# Patient Record
Sex: Male | Born: 1945
Health system: Southern US, Community
[De-identification: ages and names within clinical notes are randomized; demographics above are authoritative.]

## PROBLEM LIST (undated history)

## (undated) DIAGNOSIS — C61 Malignant neoplasm of prostate: Secondary | ICD-10-CM

## (undated) DIAGNOSIS — I10 Essential (primary) hypertension: Secondary | ICD-10-CM

## (undated) DIAGNOSIS — I499 Cardiac arrhythmia, unspecified: Secondary | ICD-10-CM

## (undated) DIAGNOSIS — D649 Anemia, unspecified: Secondary | ICD-10-CM

## (undated) DIAGNOSIS — G709 Myoneural disorder, unspecified: Secondary | ICD-10-CM

## (undated) DIAGNOSIS — E785 Hyperlipidemia, unspecified: Secondary | ICD-10-CM

## (undated) DIAGNOSIS — I4891 Unspecified atrial fibrillation: Secondary | ICD-10-CM

## (undated) DIAGNOSIS — I4892 Unspecified atrial flutter: Secondary | ICD-10-CM

## (undated) DIAGNOSIS — Z860101 Personal history of adenomatous and serrated colon polyps: Secondary | ICD-10-CM

## (undated) DIAGNOSIS — B029 Zoster without complications: Secondary | ICD-10-CM

## (undated) DIAGNOSIS — E119 Type 2 diabetes mellitus without complications: Secondary | ICD-10-CM

## (undated) DIAGNOSIS — I482 Chronic atrial fibrillation, unspecified: Secondary | ICD-10-CM

## (undated) DIAGNOSIS — Z86010 Personal history of colonic polyps: Secondary | ICD-10-CM

## (undated) DIAGNOSIS — Z9289 Personal history of other medical treatment: Secondary | ICD-10-CM

## (undated) DIAGNOSIS — I6529 Occlusion and stenosis of unspecified carotid artery: Secondary | ICD-10-CM

## (undated) HISTORY — DX: Chronic atrial fibrillation, unspecified: I48.20

## (undated) HISTORY — DX: Anemia, unspecified: D64.9

## (undated) HISTORY — DX: Personal history of other medical treatment: Z92.89

## (undated) HISTORY — DX: Hyperlipidemia, unspecified: E78.5

## (undated) HISTORY — DX: Personal history of colonic polyps: Z86.010

## (undated) HISTORY — DX: Unspecified atrial flutter: I48.92

## (undated) HISTORY — PX: MOHS SURGERY: SUR867

## (undated) HISTORY — PX: COLONOSCOPY W/ POLYPECTOMY: SHX1380

## (undated) HISTORY — DX: Type 2 diabetes mellitus without complications: E11.9

## (undated) HISTORY — PX: SEPTOPLASTY: SUR1290

## (undated) HISTORY — PX: EYE SURGERY: SHX253

## (undated) HISTORY — DX: Malignant neoplasm of prostate: C61

## (undated) HISTORY — DX: Personal history of adenomatous and serrated colon polyps: Z86.0101

## (undated) HISTORY — DX: Essential (primary) hypertension: I10

## (undated) HISTORY — DX: Occlusion and stenosis of unspecified carotid artery: I65.29

---
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
## 2002-02-26 ENCOUNTER — Ambulatory Visit (HOSPITAL_COMMUNITY): Admission: RE | Admit: 2002-02-26 | Discharge: 2002-02-26 | Payer: Self-pay | Admitting: *Deleted

## 2002-02-26 ENCOUNTER — Encounter (INDEPENDENT_AMBULATORY_CARE_PROVIDER_SITE_OTHER): Payer: Self-pay | Admitting: Specialist

## 2002-02-26 ENCOUNTER — Encounter: Payer: Self-pay | Admitting: Gastroenterology

## 2004-07-15 ENCOUNTER — Ambulatory Visit: Payer: Self-pay | Admitting: Internal Medicine

## 2004-09-05 DIAGNOSIS — C61 Malignant neoplasm of prostate: Secondary | ICD-10-CM

## 2004-09-05 HISTORY — PX: PROSTATECTOMY: SHX69

## 2004-09-05 HISTORY — DX: Malignant neoplasm of prostate: C61

## 2004-09-17 ENCOUNTER — Ambulatory Visit: Payer: Self-pay | Admitting: Internal Medicine

## 2005-01-11 ENCOUNTER — Ambulatory Visit: Payer: Self-pay | Admitting: Internal Medicine

## 2005-01-14 ENCOUNTER — Ambulatory Visit: Payer: Self-pay | Admitting: Internal Medicine

## 2005-05-30 ENCOUNTER — Ambulatory Visit: Payer: Self-pay | Admitting: Internal Medicine

## 2005-06-09 ENCOUNTER — Ambulatory Visit: Payer: Self-pay | Admitting: Internal Medicine

## 2005-06-20 ENCOUNTER — Ambulatory Visit: Payer: Self-pay

## 2005-06-22 ENCOUNTER — Inpatient Hospital Stay (HOSPITAL_COMMUNITY): Admission: RE | Admit: 2005-06-22 | Discharge: 2005-06-24 | Payer: Self-pay | Admitting: Urology

## 2005-06-22 ENCOUNTER — Encounter (INDEPENDENT_AMBULATORY_CARE_PROVIDER_SITE_OTHER): Payer: Self-pay | Admitting: *Deleted

## 2005-07-14 ENCOUNTER — Ambulatory Visit: Payer: Self-pay | Admitting: Internal Medicine

## 2005-08-18 ENCOUNTER — Ambulatory Visit: Payer: Self-pay | Admitting: Internal Medicine

## 2006-06-06 ENCOUNTER — Ambulatory Visit: Payer: Self-pay | Admitting: Internal Medicine

## 2006-07-20 ENCOUNTER — Ambulatory Visit: Payer: Self-pay | Admitting: Internal Medicine

## 2006-09-05 DIAGNOSIS — I4892 Unspecified atrial flutter: Secondary | ICD-10-CM

## 2006-09-05 HISTORY — DX: Unspecified atrial flutter: I48.92

## 2007-01-12 ENCOUNTER — Encounter: Payer: Self-pay | Admitting: Internal Medicine

## 2007-01-12 ENCOUNTER — Ambulatory Visit: Payer: Self-pay | Admitting: Internal Medicine

## 2007-01-12 ENCOUNTER — Inpatient Hospital Stay (HOSPITAL_COMMUNITY): Admission: AD | Admit: 2007-01-12 | Discharge: 2007-01-16 | Payer: Self-pay | Admitting: Internal Medicine

## 2007-01-12 ENCOUNTER — Ambulatory Visit: Payer: Self-pay | Admitting: Cardiology

## 2007-01-12 DIAGNOSIS — D126 Benign neoplasm of colon, unspecified: Secondary | ICD-10-CM | POA: Insufficient documentation

## 2007-01-13 ENCOUNTER — Ambulatory Visit: Payer: Self-pay | Admitting: Internal Medicine

## 2007-01-15 ENCOUNTER — Encounter: Payer: Self-pay | Admitting: Cardiology

## 2007-01-15 HISTORY — PX: RADIOFREQUENCY ABLATION: SHX2290

## 2007-01-18 DIAGNOSIS — Z8546 Personal history of malignant neoplasm of prostate: Secondary | ICD-10-CM | POA: Insufficient documentation

## 2007-01-19 ENCOUNTER — Ambulatory Visit: Payer: Self-pay | Admitting: Cardiology

## 2007-01-19 LAB — CONVERTED CEMR LAB
INR: 5.7 (ref 0.9–2.0)
Prothrombin Time: 30.8 s (ref 10.0–14.0)

## 2007-01-23 ENCOUNTER — Encounter: Payer: Self-pay | Admitting: Internal Medicine

## 2007-01-23 ENCOUNTER — Ambulatory Visit: Payer: Self-pay | Admitting: Cardiology

## 2007-01-23 ENCOUNTER — Ambulatory Visit: Payer: Self-pay | Admitting: Internal Medicine

## 2007-01-30 ENCOUNTER — Ambulatory Visit: Payer: Self-pay | Admitting: Cardiology

## 2007-01-30 LAB — CONVERTED CEMR LAB
INR: 7.3 (ref 0.9–2.0)
Prothrombin Time: 35.4 s (ref 10.0–14.0)

## 2007-01-31 ENCOUNTER — Ambulatory Visit: Payer: Self-pay | Admitting: Internal Medicine

## 2007-03-20 ENCOUNTER — Telehealth (INDEPENDENT_AMBULATORY_CARE_PROVIDER_SITE_OTHER): Payer: Self-pay | Admitting: *Deleted

## 2007-04-03 ENCOUNTER — Ambulatory Visit: Payer: Self-pay | Admitting: Internal Medicine

## 2007-05-23 ENCOUNTER — Ambulatory Visit (HOSPITAL_BASED_OUTPATIENT_CLINIC_OR_DEPARTMENT_OTHER): Admission: RE | Admit: 2007-05-23 | Discharge: 2007-05-23 | Payer: Self-pay | Admitting: Urology

## 2007-07-10 ENCOUNTER — Ambulatory Visit: Payer: Self-pay | Admitting: Internal Medicine

## 2007-10-10 ENCOUNTER — Ambulatory Visit: Payer: Self-pay | Admitting: Internal Medicine

## 2007-11-01 ENCOUNTER — Ambulatory Visit: Payer: Self-pay | Admitting: Internal Medicine

## 2007-11-01 DIAGNOSIS — E785 Hyperlipidemia, unspecified: Secondary | ICD-10-CM | POA: Insufficient documentation

## 2007-11-01 DIAGNOSIS — E7800 Pure hypercholesterolemia, unspecified: Secondary | ICD-10-CM | POA: Insufficient documentation

## 2007-11-16 ENCOUNTER — Encounter (INDEPENDENT_AMBULATORY_CARE_PROVIDER_SITE_OTHER): Payer: Self-pay | Admitting: *Deleted

## 2007-11-16 LAB — CONVERTED CEMR LAB
ALT: 26 units/L (ref 0–53)
AST: 23 units/L (ref 0–37)
Albumin: 3.7 g/dL (ref 3.5–5.2)
Alkaline Phosphatase: 58 units/L (ref 39–117)
BUN: 11 mg/dL (ref 6–23)
Bilirubin, Direct: 0.1 mg/dL (ref 0.0–0.3)
Cholesterol: 126 mg/dL (ref 0–200)
Creatinine, Ser: 1.1 mg/dL (ref 0.4–1.5)
HDL: 38.3 mg/dL — ABNORMAL LOW (ref >39.0)
LDL Cholesterol: 68 mg/dL (ref 0–99)
Potassium: 4.9 meq/L (ref 3.5–5.1)
Total Bilirubin: 0.8 mg/dL (ref 0.3–1.2)
Total CHOL/HDL Ratio: 3.3
Total Protein: 6.5 g/dL (ref 6.0–8.3)
Triglycerides: 99 mg/dL (ref 0–149)
VLDL: 20 mg/dL (ref 0–40)

## 2008-03-12 ENCOUNTER — Ambulatory Visit: Payer: Self-pay | Admitting: Internal Medicine

## 2008-04-15 ENCOUNTER — Ambulatory Visit (HOSPITAL_COMMUNITY): Admission: RE | Admit: 2008-04-15 | Discharge: 2008-04-16 | Payer: Self-pay | Admitting: Urology

## 2008-05-01 ENCOUNTER — Encounter: Payer: Self-pay | Admitting: Internal Medicine

## 2008-06-12 ENCOUNTER — Ambulatory Visit: Payer: Self-pay | Admitting: Internal Medicine

## 2008-07-06 HISTORY — PX: HERNIA REPAIR: SHX51

## 2008-07-25 ENCOUNTER — Ambulatory Visit (HOSPITAL_COMMUNITY): Admission: RE | Admit: 2008-07-25 | Discharge: 2008-07-25 | Payer: Self-pay | Admitting: Surgery

## 2008-07-25 ENCOUNTER — Encounter: Payer: Self-pay | Admitting: Internal Medicine

## 2008-09-05 HISTORY — PX: BLADDER SUSPENSION: SHX72

## 2008-09-17 DIAGNOSIS — I10 Essential (primary) hypertension: Secondary | ICD-10-CM | POA: Insufficient documentation

## 2008-09-17 DIAGNOSIS — I4821 Permanent atrial fibrillation: Secondary | ICD-10-CM | POA: Insufficient documentation

## 2009-03-11 ENCOUNTER — Ambulatory Visit: Payer: Self-pay | Admitting: Internal Medicine

## 2009-03-11 DIAGNOSIS — R7303 Prediabetes: Secondary | ICD-10-CM | POA: Insufficient documentation

## 2009-03-11 DIAGNOSIS — Z85828 Personal history of other malignant neoplasm of skin: Secondary | ICD-10-CM | POA: Insufficient documentation

## 2009-03-11 DIAGNOSIS — E119 Type 2 diabetes mellitus without complications: Secondary | ICD-10-CM | POA: Insufficient documentation

## 2009-03-11 HISTORY — DX: Personal history of other malignant neoplasm of skin: Z85.828

## 2009-03-12 ENCOUNTER — Encounter: Payer: Self-pay | Admitting: Internal Medicine

## 2009-03-25 ENCOUNTER — Telehealth (INDEPENDENT_AMBULATORY_CARE_PROVIDER_SITE_OTHER): Payer: Self-pay | Admitting: *Deleted

## 2009-03-25 ENCOUNTER — Encounter (INDEPENDENT_AMBULATORY_CARE_PROVIDER_SITE_OTHER): Payer: Self-pay | Admitting: *Deleted

## 2009-04-24 ENCOUNTER — Ambulatory Visit: Payer: Self-pay | Admitting: Gastroenterology

## 2009-05-15 ENCOUNTER — Encounter: Payer: Self-pay | Admitting: Gastroenterology

## 2009-05-15 ENCOUNTER — Ambulatory Visit: Payer: Self-pay | Admitting: Gastroenterology

## 2009-05-19 ENCOUNTER — Encounter: Payer: Self-pay | Admitting: Gastroenterology

## 2009-06-05 ENCOUNTER — Ambulatory Visit: Payer: Self-pay | Admitting: Internal Medicine

## 2009-07-20 ENCOUNTER — Ambulatory Visit: Payer: Self-pay | Admitting: Internal Medicine

## 2009-07-20 DIAGNOSIS — J069 Acute upper respiratory infection, unspecified: Secondary | ICD-10-CM | POA: Insufficient documentation

## 2009-07-20 DIAGNOSIS — J019 Acute sinusitis, unspecified: Secondary | ICD-10-CM | POA: Insufficient documentation

## 2009-07-23 ENCOUNTER — Telehealth: Payer: Self-pay | Admitting: Internal Medicine

## 2009-07-24 ENCOUNTER — Ambulatory Visit: Payer: Self-pay | Admitting: Internal Medicine

## 2009-07-27 LAB — CONVERTED CEMR LAB: Hgb A1c MFr Bld: 6.2 % (ref 4.6–6.5)

## 2009-08-04 ENCOUNTER — Ambulatory Visit: Payer: Self-pay | Admitting: Internal Medicine

## 2009-09-07 ENCOUNTER — Ambulatory Visit: Payer: Self-pay | Admitting: Internal Medicine

## 2009-10-15 ENCOUNTER — Telehealth (INDEPENDENT_AMBULATORY_CARE_PROVIDER_SITE_OTHER): Payer: Self-pay | Admitting: *Deleted

## 2009-10-21 ENCOUNTER — Telehealth (INDEPENDENT_AMBULATORY_CARE_PROVIDER_SITE_OTHER): Payer: Self-pay | Admitting: *Deleted

## 2009-12-02 ENCOUNTER — Telehealth (INDEPENDENT_AMBULATORY_CARE_PROVIDER_SITE_OTHER): Payer: Self-pay | Admitting: *Deleted

## 2010-02-12 ENCOUNTER — Ambulatory Visit: Payer: Self-pay | Admitting: Internal Medicine

## 2010-02-25 LAB — CONVERTED CEMR LAB
ALT: 19 units/L (ref 0–53)
AST: 18 units/L (ref 0–37)
Albumin: 4 g/dL (ref 3.5–5.2)
Alkaline Phosphatase: 50 units/L (ref 39–117)
Bilirubin, Direct: 0.1 mg/dL (ref 0.0–0.3)
Cholesterol: 142 mg/dL (ref 0–200)
HDL: 39.7 mg/dL (ref >39.00)
LDL Cholesterol: 81 mg/dL (ref 0–99)
Total Bilirubin: 0.4 mg/dL (ref 0.3–1.2)
Total CHOL/HDL Ratio: 4
Total Protein: 6.5 g/dL (ref 6.0–8.3)
Triglycerides: 107 mg/dL (ref 0.0–149.0)
VLDL: 21.4 mg/dL (ref 0.0–40.0)

## 2010-05-19 ENCOUNTER — Ambulatory Visit: Payer: Self-pay | Admitting: Internal Medicine

## 2010-10-03 LAB — CONVERTED CEMR LAB
ALT: 24 units/L (ref 0–53)
AST: 26 units/L (ref 0–37)
Albumin: 4 g/dL (ref 3.5–5.2)
Alkaline Phosphatase: 61 units/L (ref 39–117)
BUN: 13 mg/dL (ref 6–23)
Basophils Absolute: 0 10*3/uL (ref 0.0–0.1)
Basophils Relative: 0 % (ref 0.0–3.0)
Bilirubin, Direct: 0.1 mg/dL (ref 0.0–0.3)
CO2: 30 meq/L (ref 19–32)
Calcium: 9.6 mg/dL (ref 8.4–10.5)
Chloride: 103 meq/L (ref 96–112)
Cholesterol, target level: 200 mg/dL
Cholesterol: 146 mg/dL (ref 0–200)
Creatinine, Ser: 1.1 mg/dL (ref 0.4–1.5)
Eosinophils Absolute: 0.2 10*3/uL (ref 0.0–0.7)
Eosinophils Relative: 3.5 % (ref 0.0–5.0)
GFR calc non Af Amer: 71.88 mL/min (ref >60)
Glucose, Bld: 122 mg/dL — ABNORMAL HIGH (ref 70–99)
HCT: 40.5 % (ref 39.0–52.0)
HDL goal, serum: 40 mg/dL
HDL: 43.3 mg/dL (ref >39.00)
Hemoglobin: 14 g/dL (ref 13.0–17.0)
Hgb A1c MFr Bld: 6.1 % (ref 4.6–6.5)
LDL Cholesterol: 76 mg/dL (ref 0–99)
LDL Goal: 100 mg/dL
Lymphocytes Relative: 17.2 % (ref 12.0–46.0)
Lymphs Abs: 0.8 10*3/uL (ref 0.7–4.0)
MCHC: 34.6 g/dL (ref 30.0–36.0)
MCV: 95.9 fL (ref 78.0–100.0)
Monocytes Absolute: 0.5 10*3/uL (ref 0.1–1.0)
Monocytes Relative: 10.9 % (ref 3.0–12.0)
Neutro Abs: 3.1 10*3/uL (ref 1.4–7.7)
Neutrophils Relative %: 68.4 % (ref 43.0–77.0)
Platelets: 172 10*3/uL (ref 150.0–400.0)
Potassium: 5.2 meq/L — ABNORMAL HIGH (ref 3.5–5.1)
RBC: 4.22 M/uL (ref 4.22–5.81)
RDW: 11.9 % (ref 11.5–14.6)
Sodium: 139 meq/L (ref 135–145)
TSH: 0.79 microintl units/mL (ref 0.35–5.50)
Total Bilirubin: 1.1 mg/dL (ref 0.3–1.2)
Total CHOL/HDL Ratio: 3
Total Protein: 6.9 g/dL (ref 6.0–8.3)
Triglycerides: 133 mg/dL (ref 0.0–149.0)
VLDL: 26.6 mg/dL (ref 0.0–40.0)
WBC: 4.6 10*3/uL (ref 4.5–10.5)

## 2010-10-05 NOTE — Progress Notes (Signed)
Summary: [facility]  Phone Note Call from Patient Call back at [facility] Phone [phone]   Caller: Patient Summary of Call: Message left on VM, Patient will start new chlosterol med Today and would like to know when he is to have labs.   I reviewed chart and per Dr.Hopper patient should have labs 10 weeks after starting new med which would be the second week in June 2011. I called patient and left message on VM for him to call to schedule  fasting lab appointment./[name]  December 02, 2009 2:53 PM

## 2010-10-05 NOTE — Progress Notes (Signed)
Summary: Med- B/P concerns  Phone Note Call from Patient Call back at Home Phone [phone]   Caller: Patient Summary of Call: Message left on VM: Patient would like to know what Vytorin was changed to cause the pharmacy contacted him to pick up a rx and he has never heard of the med. Patient also with B/P concerns and would like a call to discuss. Patient left two numbers to reach him at the home and cell. Both numbers were said so fast that I could not make them out after listening several times.  I called patient and left message on Home Number that is provided in the chart for patient to call for an appointment to F/U on B/P and informed Vytorin was changed to Simvastatin.  Marland KitchenShonna Chock  October 21, 2009 10:27 AM    .Caralyn Guile

## 2010-10-05 NOTE — Progress Notes (Signed)
Summary:  med changes tier  Phone Note Call from Patient Call back at Home Phone [phone]   Caller: Patient fax Summary of Call: pt faxed over a letter stating that beginning September 05, 2009 vytorin will be placed on Tier 3. Current member, however, will still be able to get Vytorin at its current tier until March 31,2011.  [employer] company no prior auth required, pt co-pay will increase at 30% with a minimal co-pay of $45.  If higher co-pay to expensive pt can consider pravastatin, simvastatin, or lovastatin. Any of the statin or a combo of zetia and a statin  are consider a  tier 1. left message to call   office to inform pt of med status......................Marland KitchenFelecia Deloach CMA  October 15, 2009 11:57 AM  pt return call yesterday left message to call office........................Marland KitchenFelecia Deloach CMA  October 16, 2009 9:09 AM   Follow-up for Phone Call        dr hopper pls advise of med change due to increase in co-pay pravastatin, simvastatin, or lovastatin, or a combo of zetia and a statin.................Marland KitchenFelecia Deloach CMA  October 16, 2009 9:47 AM   [facility]  Additional Follow-up for Phone Call Additional follow up Details #1::        Simvastatin 40 mg at bedtime #90 in place of Vytorin 10/20. Lipids, hepatic panel after 10 weeks(272.4,995.20) Additional Follow-up by: Marga Melnick MD,  October 16, 2009 1:42 PM    Additional Follow-up for Phone Call Additional follow up Details #2::    pt aware rx sent to pharmacy,appt schedule................Marland KitchenFelecia Deloach CMA  October 16, 2009 2:09 PM   New/Updated Medications: SIMVASTATIN 40 MG TABS (SIMVASTATIN) Take 1 tab at bedtime Prescriptions: SIMVASTATIN 40 MG TABS (SIMVASTATIN) Take 1 tab at bedtime  #90 x 0   Entered by:   Jeremy Johann CMA   Authorized by:   Marga Melnick MD   Signed by:   Jeremy Johann CMA on 10/16/2009   Method used:   [facility]ed to ...       [facility]  [address] [phone][facility]ail[facility]     [address]       [place], [place]  [address]       Ph: [phone] or [phone]       Fax: [phone]   RxID:   [id number]

## 2010-10-05 NOTE — Assessment & Plan Note (Signed)
Summary: flu shot/cbs   Nurse Visit   Allergies: 1)  ! Levaquin  Orders Added: 1)  Admin 1st Vaccine [90471] 2)  Flu Vaccine 20yrs + [16109] Flu Vaccine Consent Questions     Do you have a history of severe allergic reactions to this vaccine? no    Any prior history of allergic reactions to egg and/or gelatin? no    Do you have a sensitivity to the preservative Thimersol? no    Do you have a past history of Guillan-Barre Syndrome? no    Do you currently have an acute febrile illness? no    Have you ever had a severe reaction to latex? no    Vaccine information given and explained to patient? yes    Are you currently pregnant? no    Lot Number:[id number]   Exp Date:03/05/2011   Site Given  Left Deltoid IMu Vaccine 58yrs + [60454] .lbflu

## 2010-10-27 ENCOUNTER — Encounter: Payer: Self-pay | Admitting: Internal Medicine

## 2010-11-30 ENCOUNTER — Other Ambulatory Visit: Payer: Self-pay | Admitting: Internal Medicine

## 2010-12-02 ENCOUNTER — Telehealth: Payer: Self-pay | Admitting: Internal Medicine

## 2010-12-02 NOTE — Telephone Encounter (Signed)
Added codes and orders to 3/30 lab

## 2010-12-02 NOTE — Telephone Encounter (Signed)
Patient has CPX on Monday 4/2, but wanted to have labs earlier--he has appointment for lab only for tomorrow Fri 3/30 at 9:15am----please provide orders and diag codes.    thanks

## 2010-12-02 NOTE — Telephone Encounter (Signed)
401.1/272.4/995.20/v70.0 Lipid/Hep/CBCD/TSH/BMP/PSA/Udip/Stool Cards

## 2010-12-03 ENCOUNTER — Other Ambulatory Visit (INDEPENDENT_AMBULATORY_CARE_PROVIDER_SITE_OTHER): Payer: BC Managed Care – PPO

## 2010-12-03 DIAGNOSIS — I10 Essential (primary) hypertension: Secondary | ICD-10-CM

## 2010-12-03 DIAGNOSIS — E785 Hyperlipidemia, unspecified: Secondary | ICD-10-CM

## 2010-12-03 DIAGNOSIS — T887XXA Unspecified adverse effect of drug or medicament, initial encounter: Secondary | ICD-10-CM

## 2010-12-03 DIAGNOSIS — Z0000 Encounter for general adult medical examination without abnormal findings: Secondary | ICD-10-CM

## 2010-12-03 LAB — CBC WITH DIFFERENTIAL/PLATELET
Basophils Absolute: 0 10*3/uL (ref 0.0–0.1)
Basophils Relative: 0.4 % (ref 0.0–3.0)
Eosinophils Relative: 3.6 % (ref 0.0–5.0)
HCT: 39.6 % (ref 39.0–52.0)
Hemoglobin: 13.7 g/dL (ref 13.0–17.0)
Lymphocytes Relative: 21.7 % (ref 12.0–46.0)
MCHC: 34.6 g/dL (ref 30.0–36.0)
MCV: 97.3 fl (ref 78.0–100.0)
Monocytes Absolute: 0.5 10*3/uL (ref 0.1–1.0)
Monocytes Relative: 10.2 % (ref 3.0–12.0)
Neutro Abs: 2.9 10*3/uL (ref 1.4–7.7)
Neutrophils Relative %: 64.1 % (ref 43.0–77.0)
Platelets: 165 10*3/uL (ref 150.0–400.0)
RBC: 4.07 Mil/uL — ABNORMAL LOW (ref 4.22–5.81)
WBC: 4.5 10*3/uL (ref 4.5–10.5)

## 2010-12-03 LAB — BASIC METABOLIC PANEL
BUN: 16 mg/dL (ref 6–23)
CO2: 29 mEq/L (ref 19–32)
Calcium: 9.1 mg/dL (ref 8.4–10.5)
Chloride: 102 mEq/L (ref 96–112)
Creatinine, Ser: 0.9 mg/dL (ref 0.4–1.5)
Glucose, Bld: 104 mg/dL — ABNORMAL HIGH (ref 70–99)
Potassium: 4.5 mEq/L (ref 3.5–5.1)
Sodium: 136 mEq/L (ref 135–145)

## 2010-12-03 LAB — HEPATIC FUNCTION PANEL
ALT: 24 U/L (ref 0–53)
Albumin: 3.9 g/dL (ref 3.5–5.2)
Bilirubin, Direct: 0.2 mg/dL (ref 0.0–0.3)
Total Bilirubin: 0.6 mg/dL (ref 0.3–1.2)
Total Protein: 6.4 g/dL (ref 6.0–8.3)

## 2010-12-03 LAB — LIPID PANEL
HDL: 39.3 mg/dL (ref >39.00)
LDL Cholesterol: 72 mg/dL (ref 0–99)
Total CHOL/HDL Ratio: 3
Triglycerides: 122 mg/dL (ref 0.0–149.0)
VLDL: 24.4 mg/dL (ref 0.0–40.0)

## 2010-12-03 LAB — TSH: TSH: 0.91 u[IU]/mL (ref 0.35–5.50)

## 2010-12-06 ENCOUNTER — Ambulatory Visit (INDEPENDENT_AMBULATORY_CARE_PROVIDER_SITE_OTHER): Payer: BC Managed Care – PPO | Admitting: Internal Medicine

## 2010-12-06 ENCOUNTER — Encounter: Payer: Self-pay | Admitting: Internal Medicine

## 2010-12-06 VITALS — BP 122/78 | HR 60 | Temp 98.0°F | Resp 14 | Ht 75.0 in | Wt 209.2 lb

## 2010-12-06 DIAGNOSIS — E785 Hyperlipidemia, unspecified: Secondary | ICD-10-CM

## 2010-12-06 DIAGNOSIS — Z86010 Personal history of colonic polyps: Secondary | ICD-10-CM

## 2010-12-06 DIAGNOSIS — Z85828 Personal history of other malignant neoplasm of skin: Secondary | ICD-10-CM

## 2010-12-06 DIAGNOSIS — I10 Essential (primary) hypertension: Secondary | ICD-10-CM

## 2010-12-06 DIAGNOSIS — Z0000 Encounter for general adult medical examination without abnormal findings: Secondary | ICD-10-CM

## 2010-12-06 DIAGNOSIS — Z23 Encounter for immunization: Secondary | ICD-10-CM

## 2010-12-06 DIAGNOSIS — Z136 Encounter for screening for cardiovascular disorders: Secondary | ICD-10-CM

## 2010-12-06 MED ORDER — LISINOPRIL 40 MG PO TABS: 40.0000 mg | ORAL_TABLET | Freq: Every day | ORAL | Status: DC

## 2010-12-06 MED ORDER — TETANUS-DIPHTH-ACELL PERTUSSIS 5-2.5-18.5 LF-MCG/0.5 IM SUSP
0.5000 mL | Freq: Once | INTRAMUSCULAR | Status: AC
  Administered 2010-12-06: 0.5 mL via INTRAMUSCULAR

## 2010-12-06 MED ORDER — SIMVASTATIN 40 MG PO TABS: 40.0000 mg | ORAL_TABLET | Freq: Every day | ORAL | Status: DC

## 2010-12-06 NOTE — Progress Notes (Signed)
  Subjective:    Patient ID: Mike Day, male    DOB: [birth year], 65 y.o.   MRN: [id number]  HPI He is here for a physical exam; he has no active health issues. He is physically active walking 3-7 miles per day 5 days a week. He plans to run a marathon next year.  Review of Systems Patient reports no  vision/ hearing changes,anorexia, weight change, fever ,adenopathy, persistant / recurrent hoarseness, swallowing issues, chest pain,palpitations, edema,persistant / recurrent cough, hemoptysis, dyspnea(rest, exertional, paroxysmal nocturnal), gastrointestinal  bleeding (melena, rectal bleeding), abdominal pain, excessive heart burn, GU symptoms( dysuria, hematuria, pyuria, voiding/incontinence  Issues) syncope, focal weakness, memory loss,numbness & tingling,new  skin/hair/nail changes,depression,significant  anxiety, abnormal bruising/bleeding, musculoskeletal symptoms/signs.      Objective:   Physical Exam Eye - Pupils Equal Round Reactive to light, Extraocular movements intact, Fundi without hemorrhage or visible lesions, Conjunctiva without redness or discharge Ears:  External ear exam shows no significant lesions or deformities.  Otoscopic examination reveals clear canals, tympanic membranes are intact bilaterally without bulging, retraction, inflammation or discharge. Hearing is grossly normal bilaterall Neck:  No deformities, thyromegaly, masses, or tenderness noted.   Supple with full range of motion without pain. Heart:  Normal rate and regular rhythm. S1 and S2 normal without gallop, click, rub or other extra sounds. Grade 1/2 systolic murmur.Abdomen: bowel sounds normal, soft and non-tender without masses, organomegaly or hernias noted.  No guarding or rebound Extremities:  No cyanosis, edema, or deformity noted with good range of motion of all major joints.    Lungs:Chest clear to auscultation; no wheezes, rhonchi,rales ,or rubs present.No increased work of breathing. Neurologic exam : Strength  equal & normal in upper & lower extremities Able to walk on heels and toes.   Balance normal  Romberg normal, finger to nose.Psych:  Cognition and judgment appear intact. Alert, communicative  and cooperative with normal attention span and concentration.   GU exam by Dr Earlene Plater 07/2010.    Assessment & Plan:  #1 comprehensive physical exam; no active issues  #2 hypertension, controlled  #3 dyslipidemia, lipids are at goal.  #4 mild fasting hyperglycemia  Plan: #1 no change will be made in medications.  #2 an A1c will be added to assess the mild hyperglycemia.

## 2010-12-06 NOTE — Patient Instructions (Signed)
Please monitor your blood pressure on a regular basis; your goals and average less than 135/85.  Preventive Health Care: Exercise at least 30-45 minutes a day,  3-4 days a week.  Eat a low-fat diet with lots of fruits and vegetables, up to 7-9 servings per day. Avoid obesity; your goal is waist measurement < 40 inches.Consume less than 40 grams of sugar per day from foods & drinks with High Fructose Corn Sugar as #2,3 or # 4 on label. Seatbelts can save your life. Wear them always. Smoke detectors on every level of your home, check batteries every year. Eye Doctor - have an eye exam @ least annually.                                                                                  Alcohol If you drink, do it moderately,less than 9 drinks per week, preferably less than 6 @ most. Health Care Power of Attorney & Living Will. Complete if not in place ; these place you in charge of your health care decisions. Depression is common in our stressful world.If you're feeling down or losing interest in things you normally enjoy, please call .

## 2010-12-07 ENCOUNTER — Encounter: Payer: Self-pay | Admitting: Internal Medicine

## 2010-12-07 DIAGNOSIS — I4892 Unspecified atrial flutter: Secondary | ICD-10-CM | POA: Insufficient documentation

## 2010-12-07 LAB — HEMOGLOBIN A1C: Hgb A1c MFr Bld: 6.2 % (ref 4.6–6.5)

## 2010-12-17 ENCOUNTER — Other Ambulatory Visit: Payer: Self-pay | Admitting: Internal Medicine

## 2010-12-23 ENCOUNTER — Other Ambulatory Visit: Payer: Self-pay | Admitting: Internal Medicine

## 2011-01-05 ENCOUNTER — Other Ambulatory Visit: Payer: BC Managed Care – PPO

## 2011-01-05 DIAGNOSIS — Z1211 Encounter for screening for malignant neoplasm of colon: Secondary | ICD-10-CM

## 2011-01-05 LAB — POC HEMOCCULT BLD/STL (OFFICE/1-CARD/DIAGNOSTIC): Fecal Occult Blood, POC: NEGATIVE

## 2011-01-18 NOTE — Assessment & Plan Note (Signed)
[facility]                         ELECTROPHYSIOLOGY OFFICE NOTE   EMMETT, BRACKNELL                       MRN:          [id number]  DATE:01/31/2007                            DOB:          April 30, 1946    [name] returns today for follow up.  He is a very pleasant 65-year-  old male with a history of atrial flutter, dyslipidemia and hypertension  who is status post electrophysiologic study and catheter ablation 2-1/2  weeks ago.  He has done well since his ablation, though he does note  that his Coumadin level has been quite high.  He has had rare  palpitations.   PHYSICAL EXAMINATION:  GENERAL:  He is a pleasant well-appearing middle-  aged man in no distress.  VITAL SIGNS:  Blood pressure today was 106/70, pulse 79 and regular,  respirations 18.  The weight was 218 pounds.  NECK:  No jugular venous distention.  LUNGS:  Clear to auscultation bilaterally.  No wheezing, rales, or  rhonchi.  CARDIOVASCULAR:  Regular rate and rhythm.  Normal S1 and S2.  EXTREMITIES:  No edema.   MEDICATIONS:  1. Lisinopril.  2. Vytorin.  3. Coumadin.  4. Aspirin.   IMPRESSION:  1. Atrial flutter status post ablation.  2. Dyslipidemia.  3. Hypertension.  4. Chronic Coumadin therapy.   DISCUSSION:  As Mike Day is out now over 2-1/2 weeks since his  ablation, and because his INRs have been very high and erratic in their  levels going from 7 to 2 to 8, I have recommended that he discontinue  his Coumadin today and cancel his Coumadin clinic follow ups.  He will  continue taking low-dose aspirin along with Vytorin and lisinopril.  Will plan to see him back on a p.r.n. basis.     Mike Day. Mike Ridgel, MD  Electronically Signed    GWT/MedQ  DD: 01/31/2007  DT: 01/31/2007  Job #: [id number]   cc:   Mike Day. Mike Ren, MD,FACP,FCCP

## 2011-01-18 NOTE — Consult Note (Signed)
NAMERANON, COVEN NO.:  [id number]   MEDICAL RECORD NO.:  [id number]          PATIENT TYPE:  OBV   LOCATION:  2022                         FACILITY:  [facility]   PHYSICIAN:  [name]. Ladona Ridgel, MD    DATE OF BIRTH:  06-22-1946   DATE OF CONSULTATION:  01/15/2007  DATE OF DISCHARGE:                                 CONSULTATION   The consultation is requested by Dr. Dietrich Pates and Dr. Jerral Bonito.   INDICATION FOR CONSULTATION:  Evaluation of atrial flutter.   The patient is a 65 year old male with a history of hypertension and  tachy palpitations, who was recently who was discovered to be in atrial  flutter approximately 1 week ago.  The patient's wife, who is a Engineer, civil (consulting),  states that she was sitting next to him and felt like his chest was  vibrating and checked his pulse and found that his heart rate was 130-  140 beats per minute.  His palpitations and rapid heart rate persisted,  though he was not particularly symptomatic with these, until he  presented to Dr. Frederik Pear office, where he was found to be in atrial  flutter with a rapid ventricular response and was subsequently admitted  for evaluation and treatment.  He was placed on AV nodal blocking agents  and heparin.  The patient now is now referred for evaluation.  He denies  chest pain.  He denies shortness of breath.  He denies peripheral edema.  He does note that, when he is in flutter, when he exerts himself, his  strength is not quite as good as it had been, nor is his activity level.  He has had no syncope.  He denies anginal symptoms.   PAST MEDICAL HISTORY:  Is notable for hypertension.  He has a history of  dyslipidemia and a history of prostate cancer status post surgery.   SOCIAL HISTORY:  The patient.  The patient has a history of cigar  smoking but denies alcohol use.  He drinks one to three glasses of wine  per week.   FAMILY HISTORY:  Notable for coronary disease.   ALLERGIES:  HE IS A HISTORY  OF ALLERGIES TO LEVAQUIN.   REVIEW OF SYSTEMS:  Is negative except for palpitations and the  sensation of feeling hot all the time, here in the last few weeks.  The  rest review of systems were reviewed and found to be negative.   PHYSICAL EXAM:  GENERAL:  He is a pleasant, well-appearing middle-aged  man in no distress.  VITAL SIGNS:  The blood pressure is 135/90.  The pulse was 110 and  irregular.  Respirations were 18.  HEENT:  Normocephalic and atraumatic.  Pupils equal and round.  Oropharynx is moist.  Sclerae anicteric.  NECK:  Revealed no jugular distension.  There is no thyromegaly.  LUNGS:  Clear bilaterally auscultation.  No wheezes, rales or rhonchi.  CARDIAC:  Had an irregular tachycardia with normal S1 and S2.  ABDOMEN:  Soft, nontender, nondistended.  There is no organomegaly.  Bowel sounds are present.  There was no rebound  or guarding.  EXTREMITIES:  Demonstrated no cyanosis, clubbing or edema.  Pulses are  2+ and symmetric.  NEUROLOGIC:  Alert and oriented x3.  Cranial nerves intact.  Strength is  5/5 and symmetric.  The EKG demonstrates atrial flutter with a rapid  ventricular response.   IMPRESSION:  1. Symptomatic typical atrial flutter.  2. Hypertension.  3. Dyslipidemia.   DISCUSSION:  I discussed the treatment options with the patient and his  wife.  The patient is not inclined for lifelong Coumadin therapy, and  for this reason would like to proceed with catheter ablation.  The  risks, benefits and goals of the procedure have been discussed with the  patient.  He has considered these options and will call us, if he would  like to proceed with catheter ablation.      [name]. Ladona Ridgel, MD  Electronically Signed     GWT/MEDQ  D:  01/15/2007  T:  01/15/2007  Job:  [id number]   cc:   Titus Dubin. Alwyn Ren, MD,FACP,FCCP

## 2011-01-18 NOTE — Op Note (Signed)
NAME:  Mike Day, Mike Day                ACCOUNT NO.:  [id number]   MEDICAL RECORD NO.:  [id number]          PATIENT TYPE:  AMB   LOCATION:  DAY                          FACILITY:  [facility]   PHYSICIAN:  Thornton Park. Daphine Deutscher, MD  DATE OF BIRTH:  08/19/1946   DATE OF PROCEDURE:  07/25/2008  DATE OF DISCHARGE:                               OPERATIVE REPORT   PREOPERATIVE DIAGNOSIS:  Umbilical hernia.   POSTOPERATIVE DIAGNOSIS:  Umbilical hernia.   PROCEDURE:  Repair of umbilical hernia with Proceed ventral patch.   SURGEON:  Thornton Park. Daphine Deutscher, MD.   ANESTHESIA:  General endotracheal.   DESCRIPTION OF PROCEDURE:  Mike Day is a 65 year old white male who  previously had a robotic prostatectomy and developed an umbilical hernia  at some point after that.  On exam he has an easily palpable defect and  a little bulge at his umbilicus.   After general endotracheal anesthesia was administered the abdomen was  prepped with Techni-Care and draped sterilely.  Preoperatively it had  been clipped.  I made a curvilinear smiling type incision  infraumbilically and carried this down sharply and then used scissors to  dissect the umbilical skin off the hernia sac.  I mobilized the hernia  sac and then cut well around it and then freed up at the perimeter of  the fascia.  I then put my finger in and was able to undermine the  fascia at least 1 cm or 2 backwards without entering the abdomen.   A new product has been substituted for Ventralex and that product is  Proceed ventral patch which I was given and I used the small variety  which was then first preloaded with four sutures of zero Prolene suture  at 90 degrees, placing the stitch through the permanent mesh and then  holding the sutures before then inserting the mesh into the  preperitoneal space beneath the fascia and then deploying the stitch  through the fascia.  These four were placed.  Again, at right angles to  each other and then when tied,  I held the mesh completely obliterating  the hernia defect.  The wound was then irrigated with saline.  The skin  was tacked down to the fascia and the wound was closed in  layers of 4-0 Vicryl and then with 5-0 Monocryl, Benzoin Steri-Strips.  The patient tolerated the procedure well.  Abdominal binder was applied  and he was given Vicodin for pain and he will be followed up in the  office in 3-4 weeks.      Thornton Park Daphine Deutscher, MD  Electronically Signed     MBM/MEDQ  D:  07/25/2008  T:  07/25/2008  Job:  [id number]   cc:   Titus Dubin. Alwyn Ren, MD,FACP,FCCP  [phone] [place]  [place] [address]   Lucrezia Starch. Earlene Plater, M.D.  Fax: [phone]

## 2011-01-18 NOTE — Op Note (Signed)
Mike Day, Mike Day                ACCOUNT NO.:  [id number]   MEDICAL RECORD NO.:  [id number]          PATIENT TYPE:  AMB   LOCATION:  [facility]                         FACILITY:  [facility]   PHYSICIAN:  Ronald L. Earlene Plater, M.D.  DATE OF BIRTH:  May 09, 1946   DATE OF PROCEDURE:  05/23/2007  DATE OF DISCHARGE:                               OPERATIVE REPORT   DIAGNOSIS:  History of robotic prostatectomy in 2007 with urinary  incontinence and a bladder neck stone.   OPERATIVE PROCEDURE:  Cystourethroscopy, removal of bladder neck stone  and underlying staple.   SURGEON:  Lucrezia Starch. Earlene Plater, M.D.   ANESTHESIA:  LMA.   ESTIMATED BLOOD LOSS:  Negligible.   TUBES:  None.   COMPLICATIONS:  None.   INDICATIONS FOR PROCEDURE:  Mike Day is a very nice 65 year old white male  who is status post robotic radical prostatectomy in 2007. He has  developed persistent urinary incontinence despite physical therapy. He  is seen by Dr. Alfredo Martinez, my partner who is an expert in  incontinence, who felt the sphincter was normal but there was a bladder  neck stone noted at the 6 o'clock position. After understanding risks,  benefits and alternative, he elected to proceed with removal of it.   PROCEDURE IN DETAIL:  The patient was placed in the supine position.  After proper LMA anesthesia, he was placed in the dorsal lithotomy  position and prepped and draped with Betadine in a sterile fashion.  Cystourethroscopy was performed with a 22.5 [place] Olympus panendoscope.  The sphincter was totally intact. At the 12 o'clock position at the  bladder neck, was an overhanging yellowish stone noted.  The bladder was  smooth walled and efflux of clear urine was noted from the normally  placed ureteral orifices bilaterally.  Utilizing the 12 degrees lens and  grasping forceps, the stone was crushed into multiple small fragments.  An underlying open staple from the dorsal vein complex Endo-GIA stapler  was removed.   Another staple could be seen just under the mucosa but was  totally intact and could not easily be grasped without affecting the  anastomosis and so was left alone.  The fragments were removed as was  the staple. Reinspection revealed that there were no further fragments  or staples.  The patient was taken to the recovery room stable.      Ronald L. Earlene Plater, M.D.  Electronically Signed     RLD/MEDQ  D:  05/23/2007  T:  05/23/2007  Job:  [id number]

## 2011-01-18 NOTE — Discharge Summary (Signed)
Mike Day, Mike Day NO.:  [id number]   MEDICAL RECORD NO.:  [id number]          PATIENT TYPE:  OBV   LOCATION:  2022                         FACILITY:  [facility]   PHYSICIAN:  Raenette Rover. Felicity Coyer, MDDATE OF BIRTH:  [birth year]   DATE OF ADMISSION:  01/12/2007  DATE OF DISCHARGE:  01/16/2007                               DISCHARGE SUMMARY   DISCHARGE DIAGNOSES:  1. Atrial flutter with rapid ventricular response, status post      radiofrequency ablation Jan 15, 2007 per Dr. Lewayne Bunting.  2. Status post transesophageal echocardiogram.  no thrombus noted Jan 15, 2007 per Dr. Dietrich Pates.  3. Hypertension.  4. Dyslipidemia.  5. History of prostate cancer.   HISTORY OF PRESENT ILLNESS:  Mike Day is a 65 year old male who was  admitted on Jan 12, 2007 with atrial flutter and rapid ventricular  response.  The patient noted a rapid heart rate for several days prior  to admission with pulse range from 131 to 145 according to the patient's  wife.  He was admitted for further evaluation and treatment.   PAST MEDICAL HISTORY:  1. Remote septoplasty.  2. Colonoscopy 2003 with a tiny poly.  3. Hypertension.  4. Dyslipidemia.  5. Minor headaches.  6. History of partially detached retina followed by Dr. Dione Booze.   COURSE OF HOSPITALIZATION:  Atrial flutter with rapid ventricular  response.  The patient was admitted and was placed on telemetry.  He was  seen initially in consultation by Dr. Dietrich Pates of [facility].  He was started on IV heparin as well as IV diltiazem and aspirin.  He  underwent a 2-D echo on Jan 13, 2007, which reportedly showed normal  limit LV function per cardiology note, however, official report is  unavailable at time of this dictation.  This was followed by  transesophageal echocardiogram which noted no thrombus.  While on  digoxin, diltiazem, and Toprol, the patient was noted to have pauses  greater than 3 seconds.  He underwent a radio  frequency ablation  performed by Dr. Lewayne Bunting of [facility] on Jan 15, 2007.  At  this time the plan per Dr. Ladona Ridgel is to continue Lovenox 100 mg subcu  q.12 hours for 2 additional days, and to follow up on Friday morning in  the Coumadin clinic.  He is to be continued on Coumadin for 3 to 4  weeks.  The patient is currently stable and asymptomatic.  Heart rate 61  at time of discharge.   MEDICATIONS AT TIME OF DISCHARGE:  1. Coumadin 5-mg tabs 1-1/2 tablet p.o. daily until followup with      Coumadin clinic.  2. Lovenox 100-mg injection subcu q.12 hours on May 13, May 14, and      May 15.  3. Lisinopril 40 mg p.o. daily.  4. Vytorin 10/20 one tablet p.o. nightly.   PERTINENT LABORATORY DATA:  At time of discharge INR 1.5, hemoglobin  13.5, hematocrit 39.6.   FOLLOWUP:  The patient is scheduled to follow up in the Coumadin Clinic  on Friday, May  16 at 1:45 p.m.  He is also scheduled to follow up with  Dr. Lewayne Bunting on Wednesday, May 28 at 2:45 p.m.  He is instructed to  follow up with Dr. Alwyn Ren in 1 to 2 weeks and contact the office for an  appointment.      Sandford Craze, NP      Raenette Rover. Felicity Coyer, MD  Electronically Signed    MO/MEDQ  D:  01/16/2007  T:  01/16/2007  Job:  [id number]   cc:   [name]. Ladona Ridgel, MD  Titus Dubin. Alwyn Ren, MD,FACP,FCCP

## 2011-01-18 NOTE — Op Note (Signed)
NAMEMIHIR, FLANIGAN NO.:  [id number]   MEDICAL RECORD NO.:  [id number]          PATIENT TYPE:  OIB   LOCATION:  0098                         FACILITY:  [facility]   PHYSICIAN:  Martina Sinner, MD DATE OF BIRTH:  13-Apr-1946   DATE OF PROCEDURE:  04/15/2008  DATE OF DISCHARGE:                               OPERATIVE REPORT   PREOPERATIVE DIAGNOSIS:  Stress urinary incontinence.   POSTOPERATIVE DIAGNOSIS:  Stress urinary incontinence.   SURGERY:  Male sling plus cystoscopy.   SURGEON:  Leighton Roach McDiarmid, M.D.   ASSISTANT:  Dr. Delman Kitten.   Mr. Mabey has mild stress urinary incontinence.  He consented to a  male sling.  Preoperative laboratory tests were normal.   DESCRIPTION OF PROCEDURE:  The patient was placed in the exaggerated  lithotomy position with the hips at an approximately 90-degree angle and  his knees at approximately shoulder height.  Extra care was taken  positioning his long lower extremities to minimize the risk of  compartment syndrome, neuropathy and DVT.   Before prepping I palpated the landmarks at the upper aspect of the  obturator foramen and the mid perineal area where the bulbar urethra  rotated cephalad.   The patient was prepped and draped.  A Foley catheter was inserted.  A  perineal incision was made and dissected down to the subcutaneous  tissues to the bulbospongiosus.  The bulbospongiosus muscle was  mobilized from the fatty tissue.  There was a little bit of adherence on  the right side but landmarks were easily identified.  The  bulbospongiosus muscle was split in the midline.  It was dissected off  the bulbar urethra.  I could identify the corporal bodies bilaterally,  the urethra in the midline, and the triangle between the two.  I could  palpate the medial and inner aspect of the inferior ramus.  Using 2  small right angle retractors, retracting towards the floor and pulling  the bulbar urethra gently towards the  ceiling, it was easy to see the  perineal tendon, which was taken down only approximately 3 or 4 mm.   The Foley catheter was then removed and we cystoscoped the urethra and  bladder.  The bladder mucosa and trigone were normal.  There was no  foreign body or staple at the bladder neck.  We could identify the  membranous urethral sphincter and we could identify the urethral  sphincter with no obvious site defect.  I then pushed on the perineal  tendon downward since he was in Trendelenburg and there was rotational  ascent with coaptation of the sphincter.   A Foley catheter was placed and the bladder was emptied.   I then took extra time and care to mark the upper aspect of the  obturator foramen.  I was diligent to be inferior to the adductor  longus.  I used a 24-French spinal needle to tap down on the bone and  then I placed it laterally straight down through the foramen.  I then  made an incision approximately 1 cm in length extending laterally.  I visually and with retractors placed my finger in the upper aspect of  the triangle previously described.  I did a few practice passes of the  AMS needle and marked with a marking pen the axis on the drape.  I then  placed the needle parallel to the spinal needle and pushed with my  thumb, felt double popping, and then rotating gently towards the midline  I immediately identified the tip of the needle.  I brought the handle  down minimally to keep the needle to the upper aspect of the triangle.  I popped through and identified landmarks with no injury.   I then attached the AMS sling with the correct orientation.  It was  brought up through the soft tissues.   The identical procedure was done on the left.  There is no question that  his endopelvic fascia or pelvic floor was difficult to pop through.  When I was bringing the sling through on the patient's right, in spite  of taking care, it popped through so quickly that the sling  itself  entered the soft tissue, not allowing me to rotate the sling in an  adequate position.   Since this happened, I divided the sling in 2, to the left of its  midline and removed both sides.  There was some bleeding on the right  but not a lot.   The identical procedure was duplicated on both sides and it went  beautifully.  In fact, the trocar was passed through the entrance point  on each side, making the pass even easier.  The sling was brought out  and it was in good anatomic position.   Prior to passing the needles I had placed a 3-0 Vicryl through the  bulbospongiosum at the level of the perineal tendon.  This 3-0 Vicryl  was brought through the middle aspect of the sling and we gradually  pulled on the sling so it laid nicely under the bulbar urethra, and  gently tied the knot.  I then used a 3-0 Vicryl to sew the more distal  flap to the urethra not under tension.   With good exposure, we then equally pulled on both arms and there was  excellent rotational ascent of the bulbar urethra.  There was no  bleeding.  The minimal bleeding on the right had stopped once I passed a  second sling.   Copious irrigation with antibiotics was utilized.  The legs were brought  a little bit closer together and down and we pulled gently a little bit  firmer.  I did a four-layer closure of 3-0 Vicryl followed by 4-0  subcuticular.  We then cut below the blue dots, removed the sheaths and  then cut the sling below the skin level.  On the left side the incision  was a little bit larger, so we used I used one 3-0 Vicryl for the soft  tissue followed by interrupted 4-0 Vicryl for the skin, and 4-0 Vicryl  was used on the right using interrupted mattress technique.  Dermabond  was applied to all incisions.  After the incisions dried, I did put a  Telfa and fluff dressing and mesh pants over the perineal incision to  minimize the risk of postoperative hematoma.  The total blood loss was   approximately 70 mL.  I was very happy with the procedure.  Hopefully  the procedure will reach the patient's treatment goal.  ______________________________  Martina Sinner, MD  Electronically Signed     SAM/MEDQ  D:  04/15/2008  T:  04/15/2008  Job:  [id number]

## 2011-01-18 NOTE — H&P (Signed)
NAMEDEVLIN, BRINK NO.:  [id number]   MEDICAL RECORD NO.:  [id number]          PATIENT TYPE:  INP   LOCATION:  2022                         FACILITY:  [facility]   PHYSICIAN:  Mike Dubin. Hopper, MD,FACP,FCCPDATE OF BIRTH:  1946-03-16   DATE OF ADMISSION:  01/12/2007  DATE OF DISCHARGE:                              HISTORY & PHYSICAL   Mike Day is a 65 year old white male who was admitted to the hospital  for atrial flutter with rapid ventricular response.   He has been traveling related to business extensively recently.  On May  5, when he returned home, his wife could actually feel a rapid heartbeat  as she sat beside on the couch.  He made little of this, but she  continued to monitor it over the next four days.  She noticed his pulse  ranged from 131 to 145 while blood pressure was within normal limits.  He did intermittently note dull left sternal border pain which would  last seconds and did not radiate.  It was not associated with nausea or  diaphoresis.  He does have diaphoresis intermittently as an unrelated  phenomena.  He has been walking up to four to five miles each day with  no change in his pulse or the chest discomfort.   PAST MEDICAL HISTORY:  Incudes a septoplasty remotely.  Colonoscopy in  2003 revealed a tiny polyp; he is to have a repeat study this year.  He  has had Mohs surgery on his hands for malignancy &  is followed by Dr.  Jorja Day.   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  In  January of 2006, he was found to have ectopic atrial foci on EKG.  He is  being treated for hypertension.   The maternal family history is strongly positive for myocardial  infarction.  His mother had diabetes and stroke.  Father had congenital  heart disease.  A brother died at 1 of a heart attack.   He smokes two cigars a week &  drinks socially.   He has had minor dull headaches for which he will take ibuprofen as  needed.   He has had no signs of upper respiratory tract infection.   He denies any respiratory symptoms.  GI review of systems is also  negative.   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   He does have some frequency and bladder issues related to his prostate  surgery.   He has had no neuropsychiatric symptoms.   He is on Vytorin 10/20, Lisinopril 40 mg daily and topical Carac from  Dr. Jorja Day.  He has no known drug allergies.   He appears healthy & no acute distress.  There are keratotic skin  changes, particularly over the upper extremities.   The fundal vessels are difficult to visualize.  Nares are patent;  scarring is present over the external nose.  Oropharynx is  unremarkable;tympanic membranes reveal no lesions.  He has no lymphadenopathy of the head, neck or axilla.  The thyroid is  normal to palpation.  He exhibits  tachycardia with a rate of 145.  A slight flow murmur is  noted.   Chest is clear with no increased work of breathing.   He has no organomegaly or masses.   Genitourinary and rectal exams are deferred, as they are not germane to  this admission.   Musculoskeletal exam is unremarkable with normal range of motion and  normal strength.   Pedal pulses are decreased, but this may be a result of the pulse rate.   He is obviously very stoic and makes the comment that I cannot stand  people who complain.   EKG shows atrial flutter with a rapid ventricular response with 2:1  block.   He will be admitted and placed on telemetry with IV heparin coverage and  IV Cardizem for rate control.  Thyroid function tests will be ordered  along with cardiac enzymes and chemistries.  Cardiology consult will be  pursued.      Mike Dubin. Alwyn Ren, MD,FACP,FCCP  Electronically Signed     WFH/MEDQ  D:  01/12/2007  T:  01/12/2007  Job:  [id number]

## 2011-01-18 NOTE — Discharge Summary (Signed)
Mike Day, TUCKEY NO.:  [id number]   MEDICAL RECORD NO.:  [id number]          PATIENT TYPE:  OBV   LOCATION:  2022                         FACILITY:  [facility]   PHYSICIAN:  Titus Dubin. Hopper, MD,FACP,FCCPDATE OF BIRTH:  1946/02/02   DATE OF ADMISSION:  01/12/2007  DATE OF DISCHARGE:  01/16/2007                               DISCHARGE SUMMARY   ALLERGIES:  LEVAQUIN.   PRINCIPAL DIAGNOSES:  1. Admitted with atrial flutter, rapid ventricular rate.  A:  Dysrhythmia, largely asymptomatic, manifested by rapid heart rates  as measured by his wife on blood pressure monitor.  1. Discharging day #1, status post electrophysiology      study/radiofrequency catheter ablation of atypical counterclockwise      atrial flutter with bidirectional isthmus block.  2. Transesophageal echocardiogram [id number].  No left atrial      appendage thrombus, normal left ventricular function.   SECONDARY DIAGNOSES:  1. Hypertension.  2. History of prostate adenocarcinoma, status post prostatectomy by      way of robotic surgery.  3. Dyslipidemia.  4. History of removal of squamous cell cancer, left wrist.  5. Strong family history of coronary artery disease.  6. Urinary incontinence after prostatectomy.   PROCEDURES:  1. [id number], transesophageal echocardiogram -  no left atrial      appendage thrombus.  Normal left ventricular function.  2. [id number] electrophysiology study/radiofrequency catheter      ablation of atypical atrial flutter with creation of bidirectional      isthmus block by Dr. Lewayne Bunting.   BRIEF HISTORY:  Mr. Duris is a 65 year old male.  There is no known  history of rhythm problems in the past.  He was seen by Dr. Alwyn Ren.  His  wife said that she notes his heart rate is fast even at rest.  She has  taken his pulse over a several-day period and the heart rate remains in  the 130-140s.  The patient has been traveling a lot, working hard but  has not  really noticed his heart beating fast.  He says that over the  past week though he has felt that something was just not quite right.  He notes no change in his ability to do things.  He walks four to five  miles a day without change.  No paroxysmal nocturnal dyspnea, no  dizziness.   The patient presents with atrial flutter of unknown duration.  He is  stable hemodynamically. He will be admitted and begun with IV heparin,  aspirin and placed on AV nodal modulating drugs.   HOSPITAL COURSE:  The patient presents to [facility] with a  history of atrial flutter, rapid ventricular rate.  He was placed on IV  Cardizem in the emergency room and has required increasing amounts of AV  nodal modulators including metoprolol 50 mg daily, digoxin load IV,  then oral and IV Cardizem culminating in Cardizem 360 mg daily.  When on  all three medications, metoprolol 50, Cardizem 360 and digoxin 0.125 mg  daily, the patient did have some bradycardia with heart rates in the 30s  overnight when he was asleep and his Cardizem has been on hold and going  into the procedure Monday May12.  As mentioned above, the patient has  no left atrial appendage thrombus on echocardiogram.  The patient had  been started on Coumadin May10.  He underwent successful  radiofrequency catheter ablation of atypical counterclockwise atrial  flutter on May12 by Dr. Ladona Ridgel. At the time of discharge the patient  will have his Cardizem, metoprolol and digoxin all stopped.  He will be  restarting his lisinopril which he has had as a home medication for  hypertension.  At the time of discharge the patient's INR is 1.5 after  three doses of Coumadin.   DISCHARGE MEDICATIONS:  1. Coumadin 5 mg tablets 1-1/2 tablet daily until he sees the Coumadin      Clinic Friday May16 at 1:45 p.m.  2. Lovenox 100 mg injected every 12 hours, Tuesday, Wednesday,      Thursday.  The patient will have five doses.  3. Lisinopril 40 mg daily.   4. Vytorin 10/20 daily at bedtime.   FOLLOW UP:  He will follow up with Dr. Ladona Ridgel Wednesday, May 28 at 2:45  p.m.   LABORATORY DATA:  Laboratory studies pertinent to this admission on the  day of discharge May13:  A complete blood count, hemoglobin was 13.5,  hematocrit 39.6, white cells 8.3, platelets are 190.  Serum electrolytes  on admission - sodium 136, potassium 4.5, chloride 102, carbonate 27,  BUN is 10, creatinine 0.96 and glucose of 104.  Once again, prothrombin  time on discharge date May13 is 19, INR 1.5.  He had troponin I  studies on admission, they were 0.03, then 0.01 and then 0.02.  Magnesium on admission was 2.3.  His TSH this admission was 0.926.  The  free T4 was 0.99, a T3 was 3.4, all within normal limits.   Greater than 25 minutes for this discharge.      Maple Mirza, PA      Titus Dubin. Alwyn Ren, MD,FACP,FCCP  Electronically Signed    GM/MEDQ  D:  01/16/2007  T:  01/16/2007  Job:  [id number]   cc:   [name]. Ladona Ridgel, MD

## 2011-01-18 NOTE — Op Note (Signed)
NAMEWINDSOR, GOEKEN NO.:  [id number]   MEDICAL RECORD NO.:  [id number]          PATIENT TYPE:  INP   LOCATION:  2022                         FACILITY:  [facility]   PHYSICIAN:  [name]. Ladona Ridgel, MD    DATE OF BIRTH:  February 05, 1946   DATE OF PROCEDURE:  01/15/2007  DATE OF DISCHARGE:                               OPERATIVE REPORT   PROCEDURE PERFORMED:  Electrophysiologic study with radiofrequency  catheter ablation of atrial flutter.   1. The patient is a 65 year old male with a history of hypertension      and dyslipidemia, who presented to hospital with tachy palpitations      and was found to be in atrial flutter.  His 2-D echo demonstrated      preserved LV function.  The patient desires not to be on lifelong      Coumadin therapy and has had rapid rates in atrial flutter as well      as very slow ventricular rates, and is now referred for left      electrophysiologic study and catheter ablation.   II.   PROCEDURE:  After informed consent was obtained, the patient was taken  to the diagnostic EP lab in fasting state.  After the usual preparation  and draping, intravenous fentanyl and Midazolam was given for sedation.  After the patient underwent transesophageal echo by Dr. Tenny Craw, which  demonstrated no left atrial appendage or thrombi, a 6-French flexible  catheter was inserted percutaneously in the right jugular vein and  advanced to the coronary sinus.  A 7-French 20 pole  halo catheter was  inserted percutaneously in the right femoral vein and advanced to right  atrium.  A 5-French quadripolar catheter was inserted percutaneously in  the right femoral vein and advanced to the His bundle region.  After  measurements of basic intervals, mapping was carried out demonstrating  typical counterclockwise tricuspid annular reentrant atrial flutter.  The 7-French quadripolar ablation catheter was maneuvered into the usual  atrial flutter isthmus.  Pacing was carried  out from this area and  concealed __________ was demonstrated.  At this point the 7-French  quadripolar ablation catheter was inserted percutaneously through the  right femoral vein and advanced to the right atrium.  Mapping was  carried out with the ablation catheter in the usual atrial flutter  isthmus.  The atrial flutter isthmus was the usual size and orientation.  During the first RF energy application, atrial flutter was terminated  and sinus rhythm was restored.  The patient tolerated the procedure very  nicely.   Following this, rapid ventricular pacing was carried out,  demonstrating  VA dissociation and programmed ventricular stimulation was carried out  demonstrating VA dissociation.  Rapid atrial pacing was carried out  demonstrating AV Wenckebach and programmed atrial stimulation was  carried out demonstrating VA dissociation.  Atrial stimulation was  carried out demonstrating an AV node ERP of 600/420.  At this point the  patient was alert for approximately 45 minutes and had no recurrent  atrial flutter isthmus conduction.  The catheter then removed.  Hemostasis was  assured.  The patient returned to his room in  satisfactory condition.   III.   COMPLICATIONS:  No immediate complications.   IV.   RESULTS:  Results demonstrate successful electrophysiologic study and RF  catheter ablation of typical atrial flutter with total of seven RF  energy applications delivered in the usual atrial flutter isthmus,  resulting in termination of atrial flutter, restoration of sinus rhythm,  and creation of bidirectional block atrial flutter isthmus.      [name]. Ladona Ridgel, MD  Electronically Signed     GWT/MEDQ  D:  01/15/2007  T:  01/16/2007  Job:  [id number]   cc:   Titus Dubin. Alwyn Ren, MD,FACP,FCCP  Pricilla Riffle, MD, [facility]

## 2011-01-18 NOTE — Consult Note (Signed)
NAMEMarland Kitchen  JARQUEZ, MESTRE NO.:  [id number]   MEDICAL RECORD NO.:  [id number]          PATIENT TYPE:  INP   LOCATION:  2022                         FACILITY:  [facility]   PHYSICIAN:  Pricilla Riffle, MD, FACCDATE OF BIRTH:  08-15-1946   DATE OF CONSULTATION:  DATE OF DISCHARGE:                                 CONSULTATION   IDENTIFICATION:  The patient is a 65 year old, who we are asked to see  regarding atrial flutter.   HISTORY OF PRESENT ILLNESS:  The patient has no known history of a  rhythm problem in the past.  He was seen by Dr. hopper today.  His wife  took him in.  The wife said on May 5, she noticed his heart rate was  fast.  His pulse is fast.  She took his pulse every day after that, and  it remained fast in the 130s to 140s.  The patient was traveling a lot  prior, and he is really not sure he ever noticed his heart beating fast.  He would say over the past week though that something just was not  quite right,  He notes he has had a residual throb in his chest.   Note last fall in [place], he had a tremendous pressure in his chest  that lasted a couple minutes, coughed and it went away.   He notes no change his ability to do things.  He walks 4 to 5 miles per  day, without a change.  No PND.  No dizziness.   He had a GXT Myoview 18 months ago, reported negative.   ALLERGIES:  LEVAQUIN.   MEDICATIONS PRIOR TO ADMISSION:  1. Lisinopril 40.  2. Vytorin 10/20.  3. Carac.  Here:  1. Vytorin 10/20.  2. Protonix.  3. Morphine.  4. Lisinopril 40.  5. Dilt 10 IV.  6. Heparin.   PAST MEDICAL HISTORY:  1. Hypertension treated 4-5 years.  2. Dyslipidemia.  3. Prostate cancer status post prostatectomy with residual urinary      incontinence.   FAMILY HISTORY:  Is significantly positive for CAD.  Mom had a CVA and  CAD, died after CVA, had a history of a-fib.  Dad had a pulmonary  embolus .  One brother died of an MI at age 38.  Maternal uncles all  died early of CAD.   SOCIAL HISTORY:  Smokes two cigars per week.  ETOH occasional, one to  three times per week.   REVIEW OF SYSTEMS:  No fevers, chills, positive sweaty.  He is hot-  blooded minus fevers.  LUNGS:  Negative.  LIVER:  Negative.  GI:  Negative.  KIDNEY:  Negative.  MUSCULOSKELETAL:  Negative scan was for  squamous cell CA on Carac.  GU:  Partial prostatectomy as noted.  OPHTHALMOLOGIC:  Detect partially detached retina.  NEURO:  No history  of TIAs.  Otherwise all systems reviewed. Negative to the above problem.   PHYSICAL EXAM:  GENERAL:  The patient is in no acute distress.  VITAL SIGNS:  Blood pressure is 137/95, pulse is 135 and regular,  temperature is 97.9, respiratory rate 18, O2 sat on 2 liters is 99%.  He  denies chest pain.  HEENT:  Normocephalic, atraumatic.  EOMI.  PERRLA.  Throat clear.  Mouth  clear.  NECK:  JVP is normal.  No bruits.  No thyromegaly.  LUNGS:  Clear to auscultation.  No rales.  CARDIAC:  Exam regular, rate and rhythm S1-S2.  No S3 and S4 murmurs.  ABDOMEN:  Benign.  No hepatomegaly.  No murmurs.  No bruits.  No masses.  EXTREMITIES:  2+ pulses, no lower extremity edema.   LABS:  Pending.  EKG atrial flutter with 2:1 AV conduction.   IMPRESSION:  1. The patient presents with atrial flutter of unknown duration, at      least one weeks, possibly longer.  He is relatively stable      hemodynamically, though he says something is not quite right,      question of chest pressure.  Admitted, to begun on treatment with      heparin, IV dilt, will bolus.  I will also start aspirin 325 daily,      will decrease lisinopril to 10 daily to tolerate the dilt.  Will      watch heart rate rhythm response, check an echocardiogram to      evaluate LVEF, chambers and need for Coumadin.  Discussed possible      RF ablation with patient, if remains in flutter.  2. Dyslipidemia.  Check lipids in a.m.  3. Hypertension.  Will follow.      Pricilla Riffle,  MD, [facility]  Electronically Signed     PVR/MEDQ  D:  01/12/2007  T:  01/13/2007  Job:  [id number]

## 2011-01-21 NOTE — Op Note (Signed)
NAME:  Mike Day, Mike Day                ACCOUNT NO.:  [id number]   MEDICAL RECORD NO.:  [id number]          PATIENT TYPE:  INP   LOCATION:  0003                         FACILITY:  [facility]   PHYSICIAN:  Mike Day, M.D.  DATE OF BIRTH:  12/17/1945   DATE OF PROCEDURE:  06/22/2005  DATE OF DISCHARGE:                                 OPERATIVE REPORT   PREOPERATIVE DIAGNOSIS:  Clinically localized adenocarcinoma of the  prostate.   POSTOPERATIVE DIAGNOSIS:  Clinically localized adenocarcinoma of the  prostate.   PROCEDURE:  Robotic assisted laparoscopic radical prostatectomy (bilateral  nerve sparing).   SURGEON:  Lucrezia Starch. Earlene Day, M.D.   ASSISTANT:  Crecencio Mc, M.D. and Valetta Fuller, M.D.   ANESTHESIA:  General.   COMPLICATIONS:  None.   ESTIMATED BLOOD LOSS:  100 mL.   INTRAVENOUS FLUIDS:  3300 mL of crystalloid.   SPECIMENS:  Prostate and seminal vesicles.   INDICATIONS FOR PROCEDURE:  [name] is a 65 year old gentleman with low  risk clinically localized adenocarcinoma of the prostate. After a discussion  of all management options, the patient elected to proceed with the above  procedures. The potential risks and benefits of this procedure was discussed  with the patient and he consented.   DESCRIPTION OF PROCEDURE:  The patient was taken to the operating room and a  general anesthetic was administered. The patient was given preoperative  antibiotics, placed in the dorsal lithotomy position, and prepped and draped  in the usual sterile fashion. Next, a Foley catheter was inserted into the  bladder. A camera port was then inserted using an open Hasson technique. A 1  cm vertical incision was made in the skin approximately 18 cm from the pubic  symphysis and just to the left of the umbilicus. This was carried down  through the subcutaneous fat with electrocautery and the anterior rectus  fascia was then divided. The underlying rectus muscle fibers were swept  laterally to either side thereby exposing the posterior rectus sheath which  was sharply incised with a knife along with the underlying peritoneum. This  allowed entry into the peritoneal cavity under direct vision and the 12 mm  port was placed. The abdomen was then insufflated and a zero degree lens was  used to inspect the abdomen and there was no evidence of any intraabdominal  injuries or other abnormalities. The remaining ports were then placed.  Bilateral 8 mm robotic ports were placed 15 cm from the pubic symphysis at  approximately 10 cm lateral to the camera port. A 5 mm port was placed  between the camera port and the right robotic port. An additional 12 mm port  was placed in far right lateral abdominal wall for laparoscopic assistance.  The surgical cart was then docked. With the aid of the hook cautery, the  bladder was reflected posteriorly allowing entry into the space of Retzius  and identification of the prostate and endopelvic fascia. The endopelvic  fascia was then divided from the apex back to the base of the prostate and  the underlying levator muscle fibers were swept  laterally off the prostate.  The puboprostatic ligaments were divided and the dorsal venous complex was  the ligated and divided with a 45 mm flex ETS stapler down to the urethra.  The Foley catheter was then manipulated allowing identification of the  bladder neck which was then divided anteriorly with the hook cautery. The  Foley catheter balloon was then deflated and the Foley catheter was brought  into the operative field to retract the prostate anteriorly. The posterior  bladder neck was then similarly divided. Dissection then continued  posteriorly until the vaso deferens and seminal vesicles were identified.  Each of these structures was then individually dissected free with blunt  dissection and cautery dissection as needed. These structures were then  reflected anteriorly. The space between  Denonvilliers' fascia and the  anterior rectum was then developed thereby isolating the vascular pedicles  of the prostate. The levator fascia overlying the prostate was then sharply  divided with scissors allowing the neurovascular bundles to be swept  laterally and posteriorly off the prostate. Attention then returned to the  vascular pedicles of the prostate which were ligated with Hem-o-lok clips.  At the base of the prostate on either side, the neurovascular bundles were  identified and preserved. Attention then turned distally. The urethra was  sharply incised along with any remaining attachments thereby freeing the  prostate and the seminal vesicle specimen. It was then placed up into the  abdomen. The pelvis was then carefully examined and hemostasis was adequate.  The pelvis was copiously irrigated and air was injected into the rectal  Foley and there was no evidence of rectal injury. Attention then turned to  the urethral anastomosis. The bladder neck was examined and it was decided  to perform a bladder neck reconstruction. The 3-0 Vicryl figure-of-eight  sutures were placed at the 5 and 7 o'clock position of the bladder neck. A 2-  0 Vicryl stitch was then placed between the bladder neck and the urethra  allowing these structures to be reapproximated at the 6 o'clock position. A  double arm 2-0 Monocryl suture was then used to perform a 360 degree running  anastomosis between the bladder neck and urethra in a water tight and  tension free fashion. A new 80 French coude' catheter was then inserted into  the bladder and the balloon was inflated with 15 mL of sterile water. The  catheter was then irrigated and there was no evidence of blood clots of  leakage from the anastomosis. The surgical cart was then undocked and the  Endopouch retrieval bag was used to retrieve the prostate specimen via the  camera port. A #0 Vicryl stitch was used to close the right sided 12 mm port site  with the aid of the Carter-Thomas device for port site closure. A 19  Jamaica Blake drain was then brought through the left robotic port and  appropriately positioned in the pelvis. It was secured to the skin with a #0  silk suture. The camera port was then slightly extended allowing the  prostate specimen to be removed intact within the Endopouch retrieval bag.  This fascial opening was then closed with a running #0 Vicryl suture. A  0.25% Marcaine was then used to inject all port sites for local anesthesia.  All skin incisions were then closed with 4-0 Monocryl subcuticular closures.  Steri-Strips and sterile dressings were applied. The patient's Foley  catheter was placed to straight drainage and the drain was placed to bulb  drainage. The  patient appeared to tolerate the procedure without  complications. He was able to be extubated and transferred to the recovery  unit in satisfactory condition. All needle and sponge counts were correct x2  at the end of the procedure.     ______________________________  Heloise Purpura, MD      Lucrezia Starch. Earlene Day, M.D.  Electronically Signed    LB/MEDQ  D:  06/22/2005  T:  06/22/2005  Job:  [id number]

## 2011-03-30 ENCOUNTER — Other Ambulatory Visit: Payer: Self-pay | Admitting: Dermatology

## 2011-06-03 LAB — BASIC METABOLIC PANEL
BUN: 12
CO2: 29
Calcium: 9.5
Chloride: 101
Creatinine, Ser: 0.89
GFR calc Af Amer: >60
GFR calc non Af Amer: >60
Glucose, Bld: 145 — ABNORMAL HIGH
Potassium: 5
Sodium: 135

## 2011-06-03 LAB — CBC
HCT: 40.8
Hemoglobin: 13.9
MCHC: 34.1
MCV: 95.7
Platelets: 182
RBC: 4.27
RDW: 13.2
WBC: 4.6

## 2011-06-03 LAB — TYPE AND SCREEN
ABO/RH(D): A POS
Antibody Screen: NEGATIVE

## 2011-06-03 LAB — APTT: aPTT: 27

## 2011-06-03 LAB — PROTIME-INR
INR: 1
Prothrombin Time: 13.4

## 2011-06-03 LAB — ABO/RH: ABO/RH(D): A POS

## 2011-06-07 LAB — BASIC METABOLIC PANEL
BUN: 10
CO2: 27
Calcium: 9.5
Chloride: 103
Creatinine, Ser: 0.87
GFR calc Af Amer: >60
GFR calc non Af Amer: >60
Glucose, Bld: 123 — ABNORMAL HIGH
Potassium: 4.1
Sodium: 135

## 2011-06-07 LAB — HEMOGLOBIN AND HEMATOCRIT, BLOOD
HCT: 40.1
Hemoglobin: 13.8

## 2011-06-15 ENCOUNTER — Ambulatory Visit (INDEPENDENT_AMBULATORY_CARE_PROVIDER_SITE_OTHER): Payer: Medicare Other

## 2011-06-15 DIAGNOSIS — Z23 Encounter for immunization: Secondary | ICD-10-CM

## 2011-06-16 LAB — POCT I-STAT 4, (NA,K, GLUC, HGB,HCT)
Glucose, Bld: 108 — ABNORMAL HIGH
HCT: 40
Hemoglobin: 13.6
Operator id: 280881
Potassium: 4
Sodium: 132 — ABNORMAL LOW

## 2011-09-19 DIAGNOSIS — L570 Actinic keratosis: Secondary | ICD-10-CM | POA: Diagnosis not present

## 2011-10-05 DIAGNOSIS — L570 Actinic keratosis: Secondary | ICD-10-CM | POA: Diagnosis not present

## 2011-10-05 DIAGNOSIS — L821 Other seborrheic keratosis: Secondary | ICD-10-CM | POA: Diagnosis not present

## 2011-10-17 DIAGNOSIS — L570 Actinic keratosis: Secondary | ICD-10-CM | POA: Diagnosis not present

## 2011-11-11 DIAGNOSIS — L570 Actinic keratosis: Secondary | ICD-10-CM | POA: Diagnosis not present

## 2011-12-23 DIAGNOSIS — L570 Actinic keratosis: Secondary | ICD-10-CM | POA: Diagnosis not present

## 2012-02-20 ENCOUNTER — Telehealth: Payer: Self-pay | Admitting: Internal Medicine

## 2012-02-20 MED ORDER — SIMVASTATIN 40 MG PO TABS: ORAL_TABLET | ORAL | Status: DC

## 2012-02-20 NOTE — Telephone Encounter (Signed)
Refill: Simvastatin 40mg  tab. Take one tablet by mouth nightly at bedtime. Qty 90. Last fill 11-28-11

## 2012-02-20 NOTE — Telephone Encounter (Signed)
Rx sent 

## 2012-02-21 ENCOUNTER — Other Ambulatory Visit: Payer: Self-pay | Admitting: Internal Medicine

## 2012-02-21 MED ORDER — LISINOPRIL 40 MG PO TABS: ORAL_TABLET | ORAL | Status: DC

## 2012-02-21 NOTE — Telephone Encounter (Signed)
refill lisinopril 40mg  tab [facility] #90, take one tablet by mouth one time daily , last fill 3.25.13, last ov 4.2.12

## 2012-02-21 NOTE — Telephone Encounter (Signed)
Patient needs to schedule a CPX  

## 2012-02-28 NOTE — Telephone Encounter (Signed)
Patient called today & scheduled CPE 1st available 8.30.13, patient stated he may need another refill before then  Thanks

## 2012-03-16 ENCOUNTER — Other Ambulatory Visit: Payer: Self-pay | Admitting: Dermatology

## 2012-03-16 DIAGNOSIS — L570 Actinic keratosis: Secondary | ICD-10-CM | POA: Diagnosis not present

## 2012-03-16 DIAGNOSIS — L821 Other seborrheic keratosis: Secondary | ICD-10-CM | POA: Diagnosis not present

## 2012-03-16 DIAGNOSIS — D485 Neoplasm of uncertain behavior of skin: Secondary | ICD-10-CM | POA: Diagnosis not present

## 2012-03-30 ENCOUNTER — Other Ambulatory Visit: Payer: Self-pay | Admitting: Internal Medicine

## 2012-04-05 ENCOUNTER — Telehealth: Payer: Self-pay | Admitting: Internal Medicine

## 2012-04-05 MED ORDER — SIMVASTATIN 40 MG PO TABS: ORAL_TABLET | ORAL | Status: DC

## 2012-04-05 MED ORDER — LISINOPRIL 40 MG PO TABS: ORAL_TABLET | ORAL | Status: DC

## 2012-04-05 NOTE — Telephone Encounter (Signed)
RX's sent to [facility] (previous pharmacy rx's were sent to)

## 2012-04-05 NOTE — Telephone Encounter (Signed)
please note pt has had to resch appt for CPE from 8.30.13 to 10.4.13 will need his refills to continue without any hold up

## 2012-04-25 ENCOUNTER — Other Ambulatory Visit: Payer: Self-pay | Admitting: Dermatology

## 2012-04-25 DIAGNOSIS — C44621 Squamous cell carcinoma of skin of unspecified upper limb, including shoulder: Secondary | ICD-10-CM | POA: Diagnosis not present

## 2012-04-25 DIAGNOSIS — L570 Actinic keratosis: Secondary | ICD-10-CM | POA: Diagnosis not present

## 2012-04-28 ENCOUNTER — Other Ambulatory Visit: Payer: Self-pay | Admitting: Internal Medicine

## 2012-05-04 ENCOUNTER — Ambulatory Visit: Payer: Medicare Other | Admitting: Internal Medicine

## 2012-05-09 ENCOUNTER — Other Ambulatory Visit: Payer: Self-pay | Admitting: Internal Medicine

## 2012-05-18 DIAGNOSIS — N529 Male erectile dysfunction, unspecified: Secondary | ICD-10-CM | POA: Diagnosis not present

## 2012-05-18 DIAGNOSIS — C61 Malignant neoplasm of prostate: Secondary | ICD-10-CM | POA: Diagnosis not present

## 2012-05-18 HISTORY — DX: Male erectile dysfunction, unspecified: N52.9

## 2012-05-18 HISTORY — DX: Malignant neoplasm of prostate: C61

## 2012-05-31 ENCOUNTER — Ambulatory Visit: Payer: Medicare Other

## 2012-06-05 ENCOUNTER — Ambulatory Visit (INDEPENDENT_AMBULATORY_CARE_PROVIDER_SITE_OTHER): Payer: Medicare Other

## 2012-06-05 DIAGNOSIS — Z23 Encounter for immunization: Secondary | ICD-10-CM | POA: Diagnosis not present

## 2012-06-08 ENCOUNTER — Telehealth: Payer: Self-pay

## 2012-06-08 ENCOUNTER — Encounter: Payer: Self-pay | Admitting: Internal Medicine

## 2012-06-08 ENCOUNTER — Ambulatory Visit (INDEPENDENT_AMBULATORY_CARE_PROVIDER_SITE_OTHER): Payer: Medicare Other | Admitting: Internal Medicine

## 2012-06-08 VITALS — BP 110/68 | HR 97 | Temp 97.9°F | Resp 12 | Ht 75.0 in | Wt 210.2 lb

## 2012-06-08 DIAGNOSIS — I4891 Unspecified atrial fibrillation: Secondary | ICD-10-CM | POA: Diagnosis not present

## 2012-06-08 DIAGNOSIS — R7309 Other abnormal glucose: Secondary | ICD-10-CM | POA: Diagnosis not present

## 2012-06-08 DIAGNOSIS — Z23 Encounter for immunization: Secondary | ICD-10-CM | POA: Diagnosis not present

## 2012-06-08 DIAGNOSIS — I10 Essential (primary) hypertension: Secondary | ICD-10-CM

## 2012-06-08 DIAGNOSIS — Z860100 Personal history of colon polyps, unspecified: Secondary | ICD-10-CM

## 2012-06-08 DIAGNOSIS — Z0000 Encounter for general adult medical examination without abnormal findings: Secondary | ICD-10-CM

## 2012-06-08 DIAGNOSIS — E785 Hyperlipidemia, unspecified: Secondary | ICD-10-CM

## 2012-06-08 LAB — HEPATIC FUNCTION PANEL
ALT: 25 U/L (ref 0–53)
AST: 22 U/L (ref 0–37)
Alkaline Phosphatase: 53 U/L (ref 39–117)
Total Bilirubin: 0.9 mg/dL (ref 0.3–1.2)

## 2012-06-08 LAB — CBC WITH DIFFERENTIAL/PLATELET
Basophils Absolute: 0 10*3/uL (ref 0.0–0.1)
Eosinophils Absolute: 0.1 10*3/uL (ref 0.0–0.7)
Hemoglobin: 14.1 g/dL (ref 13.0–17.0)
Lymphocytes Relative: 21.3 % (ref 12.0–46.0)
Lymphs Abs: 1.3 10*3/uL (ref 0.7–4.0)
MCHC: 33.3 g/dL (ref 30.0–36.0)
Neutro Abs: 3.9 10*3/uL (ref 1.4–7.7)
Platelets: 152 10*3/uL (ref 150.0–400.0)
RDW: 13.4 % (ref 11.5–14.6)

## 2012-06-08 LAB — BASIC METABOLIC PANEL
BUN: 19 mg/dL (ref 6–23)
CO2: 27 mEq/L (ref 19–32)
Calcium: 9.2 mg/dL (ref 8.4–10.5)
GFR: 66.26 mL/min (ref >60.00)
Glucose, Bld: 101 mg/dL — ABNORMAL HIGH (ref 70–99)
Sodium: 134 mEq/L — ABNORMAL LOW (ref 135–145)

## 2012-06-08 LAB — LIPID PANEL
Cholesterol: 104 mg/dL (ref 0–200)
HDL: 41.7 mg/dL (ref >39.00)

## 2012-06-08 LAB — HEMOGLOBIN A1C: Hgb A1c MFr Bld: 6.4 % (ref 4.6–6.5)

## 2012-06-08 MED ORDER — SIMVASTATIN 40 MG PO TABS: ORAL_TABLET | ORAL | Status: DC

## 2012-06-08 MED ORDER — METOPROLOL TARTRATE 25 MG PO TABS: 25.0000 mg | ORAL_TABLET | Freq: Two times a day (BID) | ORAL | Status: DC

## 2012-06-08 MED ORDER — LISINOPRIL 40 MG PO TABS: 40.0000 mg | ORAL_TABLET | Freq: Every day | ORAL | Status: DC

## 2012-06-08 MED ORDER — RIVAROXABAN 20 MG PO TABS: 20.0000 mg | ORAL_TABLET | Freq: Every day | ORAL | Status: DC

## 2012-06-08 NOTE — Telephone Encounter (Signed)
Patient called no answer.Left message on voice mail received a call from Dr.Hopper needing appointment with Dr.Taylor next week.Appointment scheduled with Dr.Taylor 06/12/12 at 2:30 pm.

## 2012-06-08 NOTE — Patient Instructions (Addendum)
Xarelto 20 mg daily X 5 days.  If you activate My Chart; the results can be released to you as soon as they populate from the lab. If you choose not to use this program; the labs have to be reviewed, copied & mailed   causing a delay in getting the results to you.

## 2012-06-08 NOTE — Progress Notes (Signed)
Subjective:    Patient ID: Mike Day, male    DOB: 12-17-1945, 66 y.o.   MRN: [id number]  HPI Medicare Wellness Visit:  The following psychosocial & medical history were reviewed as required by Medicare.   Social history: caffeine: 3-4 cups of coffee/ day , alcohol:  14 glasses of alcohol / week ,  tobacco use : quit 2010  & exercise :> 3X/ week & walking 3-5 miles 2X/ week.   Home & personal  safety / fall risk: no issues, activities of daily living: no limitations , seatbelt use : yes , and smoke alarm employment : yes .  Power of Attorney/Living Will status : in place  Vision ( as recorded per Nurse) & Hearing  evaluation :  Ophth exam 3/13; no hearing exam. Orientation :orientedX3  , memory & recall :good, spelling  testing: good,and mood & affect : normal . Depression / anxiety: anxiety @ times Travel history : [place] 26 mos ago , immunization status :PNA today , transfusion history:  no, and preventive health surveillance ( colonoscopies, BMD , etc as per protocol/ SOC):colonoscopy up to date, Dental care:  Every 6 mos . Chart reviewed &  Updated. Active issues reviewed & addressed.       Review of Systems HYPERTENSION: Disease Monitoring: Blood pressure range- 120/75- 140/85  Chest pain, palpitations- no      Dyspnea- no Medications: Compliance- yes  Lightheadedness,Syncope- no   Edema- no  FASTING HYPERGLYCEMIA, PMH of:  Blood Sugar ranges-no monitor  Polyuria/phagia/dipsia- frequency from prostate surgery       Visual problems- no  HYPERLIPIDEMIA: Disease Monitoring: See symptoms for Hypertension Medications: Compliance- yes  Abd pain, bowel changes- no   Muscle aches- no  He presents with atrial fibrillation with variable rates up to 126. He is totally asymptomatic as noted by the review of systems above. He has a history of ablation in 2008 for atrial flutter fib by Dr. Ladona Ridgel.  He denies any bleeding dyscrasias; he specifically denies epistaxis,  hemoptysis, melena, rectal bleeding, or hematuria.            Objective:   Physical Exam Gen.: Healthy and well-nourished in appearance. Alert, appropriate and cooperative throughout exam. Head: Normocephalic without obvious abnormalities; pattern alopecia  Eyes: No corneal or conjunctival inflammation noted. Pupils equal round reactive to light and accommodation.  Extraocular motion intact. Vision grossly normal with lenses. Ears: External  ear exam reveals no significant lesions or deformities. Canals clear .TMs normal. Hearing is grossly normal bilaterally. Nose: External nasal exam reveals no deformity or inflammation. Nasal mucosa are pink and moist. No lesions or exudates noted.  Mouth: Oral mucosa and oropharynx reveal no lesions or exudates. Teeth in good repair. Neck: No deformities, masses, or tenderness noted. Range of motion & Thyroid normal Lungs: Normal respiratory effort; chest expands symmetrically. Lungs are clear to auscultation without rales, wheezes, or increased work of breathing. Heart: Rapid irregular rhythm. Slight flow murmur. Abdomen: Bowel sounds normal; abdomen soft and nontender. No masses, organomegaly or hernias noted. Genitalia: Dr Darvin Neighbours Musculoskeletal/extremities: No deformity or scoliosis noted of  the thoracic or lumbar spine. No clubbing, cyanosis, edema, or deformity noted. Range of motion  normal .Tone & strength  normal.Joints normal. Nail health  good. Vascular: Carotid, radial artery,  and  posterior tibial pulses are full and equal.Decreased DPP. No bruits present. Neurologic: Alert and oriented x3. Deep tendon reflexes symmetrical and normal.          Skin: Intact  without suspicious lesions or rashes. Solar changes of scalp & extremities Lymph: No cervical, axillary lymphadenopathy present. Psych: Mood and affect are normal. Normally interactive                                                                                           Assessment & Plan:  #1 Medicare Wellness Exam; criteria met ; data entered #2 Problem List reviewed ; Assessment/ Recommendations made  #3 atrial fibrillation with rapid ventricular response,asymptomatic recurrence. Plan: Dr. [place], cardiologist was consulted concerning the atrial fibrillation which has recurred. Beta blocker will be initiated at low dose (see BP today)for rate control. Baby aspirin will be stopped. Xarelto 20 mg will be initiated until followup can be completed with Dr. Ladona Ridgel. He was given a sample of 5 tablets. Labs will be checked today to verify normal thyroid and renal function  as well as assessing chemistries and electrolytes.

## 2012-06-12 ENCOUNTER — Ambulatory Visit (INDEPENDENT_AMBULATORY_CARE_PROVIDER_SITE_OTHER): Payer: Medicare Other | Admitting: Internal Medicine

## 2012-06-12 ENCOUNTER — Encounter: Payer: Self-pay | Admitting: Internal Medicine

## 2012-06-12 VITALS — BP 150/90 | HR 96 | Ht 75.0 in | Wt 211.8 lb

## 2012-06-12 DIAGNOSIS — I4891 Unspecified atrial fibrillation: Secondary | ICD-10-CM

## 2012-06-12 MED ORDER — METOPROLOL TARTRATE 25 MG PO TABS: 25.0000 mg | ORAL_TABLET | Freq: Two times a day (BID) | ORAL | Status: DC

## 2012-06-12 MED ORDER — ASPIRIN EC 325 MG PO TBEC: 325.0000 mg | DELAYED_RELEASE_TABLET | Freq: Every day | ORAL | Status: DC

## 2012-06-12 MED ORDER — LISINOPRIL 40 MG PO TABS: ORAL_TABLET | ORAL | Status: DC

## 2012-06-12 NOTE — Assessment & Plan Note (Addendum)
The patient has new onset atrial fibrillation. Despite this, he appears to be minimally symptomatic if at all. His risk factor for stroke is low. I've recommended that he stop taking systemic anticoagulation and start taking aspirin 325 mg daily. If he develops a full blown diabetes, then he would require systemic anticoagulation. The patient is not symptomatic. For this reason, I recommended that he continue his beta blocker. I would expect that he would require up titration of this medication in the future. He may require discontinuation of his other antihypertensive medications. Finally, we discussed the possibility of rhythm control. At the current time and because he is so it minimally if at all symptomatic I think rate control would be most appropriate.

## 2012-06-12 NOTE — Progress Notes (Signed)
HPI Mike Day is referred today by Dr. Alwyn Ren for evaluation of new onset atrial fibrillation.Mike Day He is a very pleasant 66 year old man who developed atrial flutter 5 years ago and underwent catheter ablation. He did well, maintaining sinus rhythm until he was discovered to be out of rhythm several days ago. The patient did not have palpitations and was unaware that he had gone out of rhythm. He does note very vigorous physical activity. He does not have palpitations, shortness of breath, syncope, or chest pain. He was placed on systemic anticoagulation and begun on Lopressor. He has felt dizzy since starting his Lopressor. Allergies  Allergen Reactions  . Levofloxacin     REACTION: Rash     Current Outpatient Prescriptions  Medication Sig Dispense Refill  . Flaxseed, Linseed, (FLAX SEEDS PO) Take 1,400 mg by mouth daily.      Mike Day lisinopril (PRINIVIL,ZESTRIL) 40 MG tablet 1/2 tablet twice daily  90 tablet  3  . metoprolol tartrate (LOPRESSOR) 25 MG tablet Take 1 tablet (25 mg total) by mouth 2 (two) times daily.  60 tablet  11  . OMEGA 3 1000 MG CAPS Take by mouth. 2 BY MOUTH DAILY      . simvastatin (ZOCOR) 40 MG tablet TAKE ONE TABLET BY MOUTH NIGHTLY AT BEDTIME  90 tablet  3  . DISCONTD: lisinopril (PRINIVIL,ZESTRIL) 40 MG tablet Take 1 tablet (40 mg total) by mouth daily.  90 tablet  3  . DISCONTD: metoprolol tartrate (LOPRESSOR) 25 MG tablet Take 1 tablet (25 mg total) by mouth 2 (two) times daily.  60 tablet  1  . aspirin EC 325 MG tablet Take 1 tablet (325 mg total) by mouth daily.  30 tablet  0     Past Medical History  Diagnosis Date  . Atrial flutter 2008    Dr Lewayne Day    ROS:   All systems reviewed and negative except as noted in the HPI.   Past Surgical History  Procedure Date  . 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 Mohs surgery     left hand  . Colonoscopy w/ polypectomy     Mike Day  . Septoplasty Age 62  . Hernia repair 07/2008     Dr.Martin  . Prostatectomy 2006    Robotic for adenocarcinoma; Dr Mike Day     Family History  Problem Relation Age of Onset  . Lung disease Father     Black Lung  . Heart disease Father     Congenital  . Diabetes Mother   . Stroke Mother 20  . Hypertension Brother   . Heart attack Brother 56    sudden death  . Cancer Neg Hx   . Emphysema Father      History   Social History  . Marital Status: Married    Spouse Name: N/A    Number of Children: N/A  . Years of Education: N/A   Occupational History  . Not on file.   Social History Main Topics  . Smoking status: Former Smoker    Quit date: 09/06/2007  . Smokeless tobacco: Not on file   Comment: Cigars  . Alcohol Use: 8.4 oz/week    14 Glasses of wine per week     Socially  . Drug Use: No  . Sexually Active: Not on file   Other Topics Concern  . Not on file   Social History Narrative  . No narrative on file  BP 150/90  Pulse 96  Ht 6\' 3"  (1.905 m)  Wt 211 lb 12.8 oz (96.072 kg)  BMI 26.47 kg/m2  Physical Exam:  Well appearing middle-aged man, NAD HEENT: Unremarkable Neck:  No JVD, no thyromegally Lungs:  Clear with no wheezes, rales, or rhonchi. HEART:  IRegular rate rhythm, no murmurs, no rubs, no clicks Abd:  soft, positive bowel sounds, no organomegally, no rebound, no guarding Ext:  2 plus pulses, no edema, no cyanosis, no clubbing Skin:  No rashes no nodules Neuro:  CN II through XII intact, motor grossly intact  EKG Atrial fibrillation with a ventricular rate of 96 beats per minute.   Assess/Plan:

## 2012-06-12 NOTE — Patient Instructions (Addendum)
Your physician recommends that you schedule a follow-up appointment in: 3 months with Dr Ladona Ridgel  Your physician has recommended you make the following change in your medication:  1) Stop Xarelto 2) Start Aspirin 325mg  daily 3) Change Lisinopril to 1/2 tablet twice daily

## 2012-06-21 ENCOUNTER — Institutional Professional Consult (permissible substitution): Payer: Medicare Other | Admitting: Internal Medicine

## 2012-08-17 ENCOUNTER — Other Ambulatory Visit: Payer: Self-pay | Admitting: Dermatology

## 2012-08-17 DIAGNOSIS — L821 Other seborrheic keratosis: Secondary | ICD-10-CM | POA: Diagnosis not present

## 2012-08-17 DIAGNOSIS — L570 Actinic keratosis: Secondary | ICD-10-CM | POA: Diagnosis not present

## 2012-08-17 DIAGNOSIS — D485 Neoplasm of uncertain behavior of skin: Secondary | ICD-10-CM | POA: Diagnosis not present

## 2012-08-17 DIAGNOSIS — Z85828 Personal history of other malignant neoplasm of skin: Secondary | ICD-10-CM | POA: Diagnosis not present

## 2012-09-13 ENCOUNTER — Encounter: Payer: Self-pay | Admitting: Internal Medicine

## 2012-09-13 ENCOUNTER — Ambulatory Visit (INDEPENDENT_AMBULATORY_CARE_PROVIDER_SITE_OTHER): Payer: Medicare Other | Admitting: Internal Medicine

## 2012-09-13 VITALS — BP 136/81 | HR 67 | Wt 213.0 lb

## 2012-09-13 DIAGNOSIS — R7309 Other abnormal glucose: Secondary | ICD-10-CM

## 2012-09-13 DIAGNOSIS — I4891 Unspecified atrial fibrillation: Secondary | ICD-10-CM | POA: Diagnosis not present

## 2012-09-13 DIAGNOSIS — I10 Essential (primary) hypertension: Secondary | ICD-10-CM

## 2012-09-13 NOTE — Assessment & Plan Note (Signed)
His atrial fibrillation appears to be well-controlled and he is asymptomatic. He will continue his current medical therapy.

## 2012-09-13 NOTE — Assessment & Plan Note (Signed)
His blood sugars have been well controlled. He does not carry a diagnosis of diabetes. If and when he becomes diabetic, we will start systemic anticoagulation.

## 2012-09-13 NOTE — Assessment & Plan Note (Signed)
His blood pressure is well controlled. He has had some trouble with his blood pressure cuff. I recommended that it be recalibrated. He will maintain a low-sodium diet. He will continue his regular exercise program.

## 2012-09-13 NOTE — Progress Notes (Signed)
HPI Mr.Kinnett returns today for followup. He is a 67 year old man with a history of atrial fibrillation, hypertension, and dyslipidemia. He was diagnosed with atrial fibrillation approximately 3 months ago. At that time he was asymptomatic. The patient has done well in the interim. We placed him on beta blocker therapy for rate control and after an initial break in period, he has felt well. He denies syncope, chest pain, or shortness of breath.  Allergies  Allergen Reactions  . Levofloxacin     REACTION: Rash     Current Outpatient Prescriptions  Medication Sig Dispense Refill  . aspirin EC 325 MG tablet Take 1 tablet (325 mg total) by mouth daily.  30 tablet  0  . Flaxseed, Linseed, (FLAX SEEDS PO) Take 1,400 mg by mouth daily.      [name] lisinopril (PRINIVIL,ZESTRIL) 40 MG tablet 1/2 tablet twice daily  90 tablet  3  . metoprolol tartrate (LOPRESSOR) 25 MG tablet Take 1 tablet (25 mg total) by mouth 2 (two) times daily.  60 tablet  11  . OMEGA 3 1000 MG CAPS Take by mouth. 2 BY MOUTH DAILY      . simvastatin (ZOCOR) 40 MG tablet TAKE ONE TABLET BY MOUTH NIGHTLY AT BEDTIME  90 tablet  3     Past Medical History  Diagnosis Date  . Atrial flutter 2008    Dr Lewayne Bunting    ROS:   All systems reviewed and negative except as noted in the HPI.   Past Surgical History  Procedure Date  . 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 Mohs surgery     left hand  . Colonoscopy w/ polypectomy     [facility]  . Septoplasty Age 68  . Hernia repair 07/2008    Dr.Martin  . Prostatectomy 2006    Robotic for adenocarcinoma; Dr Darvin Neighbours     Family History  Problem Relation Age of Onset  . Lung disease Father     Black Lung  . Heart disease Father     Congenital  . Diabetes Mother   . Stroke Mother 48  . Hypertension Brother   . Heart attack Brother 56    sudden death  . Cancer Neg Hx   . Emphysema Father      History   Social History  . Marital Status:  Married    Spouse Name: N/A    Number of Children: N/A  . Years of Education: N/A   Occupational History  . Not on file.   Social History Main Topics  . Smoking status: Former Smoker    Quit date: 09/06/2007  . Smokeless tobacco: Not on file     Comment: Cigars  . Alcohol Use: 8.4 oz/week    14 Glasses of wine per week     Comment: Socially  . Drug Use: No  . Sexually Active: Not on file   Other Topics Concern  . Not on file   Social History Narrative  . No narrative on file     BP 136/81  Pulse 67  Wt 213 lb (96.616 kg)  Physical Exam:  Well appearing  67 year old man, NAD HEENT: Unremarkable Neck:  No JVD, no thyromegally Lungs:  Clear with no wheezes, rales, or rhonchi.  HEART:  Regular rate rhythm, no murmurs, no rubs, no clicks Abd:  soft, positive bowel sounds, no organomegally, no rebound, no guarding Ext:  2 plus pulses, no edema, no cyanosis, no clubbing Skin:  No rashes no nodules Neuro:  CN II through XII intact, motor grossly intact  Assess/Plan:

## 2012-09-13 NOTE — Patient Instructions (Signed)
Your physician wants you to follow-up in: 6 months with Dr Taylor You will receive a reminder letter in the mail two months in advance. If you don't receive a letter, please call our office to schedule the follow-up appointment.  

## 2012-12-07 ENCOUNTER — Telehealth: Payer: Self-pay | Admitting: Internal Medicine

## 2012-12-07 NOTE — Telephone Encounter (Signed)
Patient Information:  Caller Name: Keelyn  Phone: [phone]  Patient: Mike Day, Mike Day  Gender: Male  DOB: [birth year]  Age: 67 Years  PCP: Marga Melnick  Office Follow Up:  Does the office need to follow up with this patient?: No  Instructions For The Office: N/A  RN Note:  Patient calling about cold symptoms.  Onset 48 hours ago.  States initially had fever to 101, but states now afebrile.  Cough, runny nose, congestion, mild headache.  Per colds protocol, emergent symptoms denied; advised for home care, with callback parameters given.  krs/can  Symptoms  Reason For Call & Symptoms: upper respiratory symptoms  Reviewed Health History In EMR: Yes  Reviewed Medications In EMR: Yes  Reviewed Allergies In EMR: Yes  Reviewed Surgeries / Procedures: Yes  Date of Onset of Symptoms: 12/05/2012  Guideline(s) Used:  Colds  Disposition Per Guideline:   Home Care  Reason For Disposition Reached:   Colds with no complications  Advice Given:  Reassurance  It sounds like an uncomplicated cold that we can treat at home.  Colds are very common and may make you feel uncomfortable.  Colds are caused by viruses, and no medicine or "shot" will cure an uncomplicated cold.  Colds are usually not serious.  Here is some care advice that should help.  For a Runny Nose With Profuse Discharge:   Nasal mucus and discharge helps to wash viruses and bacteria out of the nose and sinuses.  Blowing the nose is all that is needed.  If the skin around your nostrils gets irritated, apply a tiny amount of petroleum ointment to the nasal openings once or twice a day.  Treatment for Associated Symptoms of Colds:  For muscle aches, headaches, or moderate fever (more than 101 F or 38.9 C): Take acetaminophen every 4 hours.  Sore throat: Try throat lozenges, hard candy, or warm chicken broth.  Cough: Use cough drops.  Hydrate: Drink adequate liquids.  Humidifier:  If the air in your home is dry, use a  cool-mist humidifier  Contagiousness:  The cold virus is present in your nasal secretions.  Cover your nose and mouth with a tissue when you sneeze or cough.  Wash your hands frequently with soap and water.  You can return to work or school after the fever is gone and you feel well enough to participate in normal activities.  Expected Course:   Fever may last 2-3 days  Nasal discharge 7-14 days  Cough up to 2-3 weeks.  Call Back If:  Difficulty breathing occurs  Fever lasts more than 3 days  Nasal discharge lasts more than 10 days  Cough lasts more than 3 weeks  You become worse  Patient Will Follow Care Advice:  YES

## 2012-12-07 NOTE — Telephone Encounter (Signed)
Spoke with patient, patient was informed of process for being scheduled for [facility] if needed. Patient did not feel the need to be scheduled at this time. Patient stated he will follow home care advice given by triage nurse.

## 2012-12-10 ENCOUNTER — Telehealth: Payer: Self-pay | Admitting: Internal Medicine

## 2012-12-10 NOTE — Telephone Encounter (Signed)
Patient Information:  Caller Name: Ekam  Phone: [phone]  Patient: Mike Day, Mike Day  Gender: Male  DOB: Jul 30, 1946  Age: 67 Years  PCP: Marga Melnick  Office Follow Up:  Does the office need to follow up with this patient?: No  Instructions For The Office: N/A  RN Note:  pt reports he has been a fever off and on (now it is a low grade).  RN offered patient appt for today 12/10/12 with a different provider, but pt declined.  At patient's request RN made an appt with Dr Alwyn Ren on 12/11/12 at 1000  Symptoms  Reason For Call & Symptoms: cold and congestion, no energy  Reviewed Health History In EMR: Yes  Reviewed Medications In EMR: Yes  Reviewed Allergies In EMR: Yes  Reviewed Surgeries / Procedures: Yes  Date of Onset of Symptoms: 12/05/2012  Treatments Tried: mucinex-dm  Treatments Tried Worked: No  Guideline(s) Used:  Colds  Disposition Per Guideline:   See Today or Tomorrow in Office  Reason For Disposition Reached:   Patient wants to be seen  Advice Given:  For a Stuffy Nose - Use Nasal Washes:  Introduction: Saline (salt water) nasal irrigation (nasal wash) is an effective and simple home remedy for treating stuffy nose and sinus congestion. The nose can be irrigated by pouring, spraying, or squirting salt water into the nose and then letting it run back out.  Call Back If:  Difficulty breathing occurs  You become worse  Patient Will Follow Care Advice:  YES  Appointment Scheduled:  12/11/2012 10:00:00 Appointment Scheduled Provider:  Marga Melnick

## 2012-12-10 NOTE — Telephone Encounter (Signed)
Appointment Scheduled:  12/11/2012 10:00:00  Appointment Scheduled Provider:  Marga Melnick

## 2012-12-11 ENCOUNTER — Encounter: Payer: Self-pay | Admitting: Internal Medicine

## 2012-12-11 ENCOUNTER — Ambulatory Visit (INDEPENDENT_AMBULATORY_CARE_PROVIDER_SITE_OTHER): Payer: Medicare Other | Admitting: Internal Medicine

## 2012-12-11 VITALS — BP 138/76 | HR 65 | Temp 98.2°F | Wt 211.0 lb

## 2012-12-11 DIAGNOSIS — J069 Acute upper respiratory infection, unspecified: Secondary | ICD-10-CM | POA: Diagnosis not present

## 2012-12-11 MED ORDER — HYDROCODONE-HOMATROPINE 5-1.5 MG/5ML PO SYRP: 5.0000 mL | ORAL_SOLUTION | Freq: Four times a day (QID) | ORAL | Status: DC | PRN

## 2012-12-11 MED ORDER — AMOXICILLIN 500 MG PO CAPS: 500.0000 mg | ORAL_CAPSULE | Freq: Three times a day (TID) | ORAL | Status: DC

## 2012-12-11 MED ORDER — FLUTICASONE PROPIONATE 50 MCG/ACT NA SUSP: 1.0000 | Freq: Two times a day (BID) | NASAL | Status: DC | PRN

## 2012-12-11 NOTE — Patient Instructions (Addendum)

## 2012-12-11 NOTE — Progress Notes (Signed)
  Subjective:    Patient ID: Mike Day, male    DOB: 07-11-1946, 67 y.o.   MRN: [id number]  HPI  Symptoms began 12/04/12 as a dry cough which progressed and was associated with head and chest congestion. He's had some yellow nasal discharge. He's also experienced some chills.  He took Mucinex DM and Lawyer with some response of the cough.   Significantly he is on lisinopril 40 mg daily. He has a history of allergy to levofloxacin with documented rash.    Review of Systems  He denies frontal headache, facial pain, dental pain, significant sore throat, or otic pain, otic discharge. The cough is not been associated with shortness of breath or wheezing. He's had no extrinsic symptoms of itchy, watery eyes, or sneezing.  Despite the chills; he denies fever or sweats.     Objective:   Physical Exam General appearance:good health ;well nourished; no acute distress or increased work of breathing is present.  No  lymphadenopathy about the head, neck, or axilla noted.   Eyes: No conjunctival inflammation or lid edema is present.   Ears:  External ear exam shows no significant lesions or deformities.  Otoscopic examination reveals clear canals, tympanic membranes are intact bilaterally without bulging, retraction, inflammation or discharge.  Nose:  External nasal examination shows no deformity or inflammation. Nasal mucosa are pink and moist without lesions or exudates. No septal dislocation or deviation.No obstruction to airflow.   Oral exam: Dental hygiene is good; lips and gums are healthy appearing.There is no oropharyngeal erythema or exudate noted.   Neck:  No deformities,  masses, or tenderness noted.     Heart:  Normal rate and regular rhythm. S1 and S2 normal without gallop, murmur, click,or  rub .S4   Lungs:Chest clear to auscultation; no wheezes, rhonchi,rales ,or rubs present.No increased work of breathing.  Dry cough  Extremities:  No cyanosis, edema, or clubbing   noted    Skin: Warm & dry .         Assessment & Plan:   #1 upper respiratory tract infection, acute  #2 cough in the context of 1 and also high-dose ACE inhibitor.  Plan: See orders and recommendations. If the cough persists after the acute infection is treated; the ACE inhibitor may need to be changed to an ARB.

## 2013-05-15 DIAGNOSIS — N529 Male erectile dysfunction, unspecified: Secondary | ICD-10-CM | POA: Diagnosis not present

## 2013-05-15 DIAGNOSIS — C61 Malignant neoplasm of prostate: Secondary | ICD-10-CM | POA: Diagnosis not present

## 2013-05-29 ENCOUNTER — Ambulatory Visit (INDEPENDENT_AMBULATORY_CARE_PROVIDER_SITE_OTHER): Payer: Medicare Other

## 2013-05-29 DIAGNOSIS — Z23 Encounter for immunization: Secondary | ICD-10-CM | POA: Diagnosis not present

## 2013-06-21 ENCOUNTER — Encounter: Payer: Self-pay | Admitting: Internal Medicine

## 2013-07-02 ENCOUNTER — Ambulatory Visit (INDEPENDENT_AMBULATORY_CARE_PROVIDER_SITE_OTHER): Payer: Medicare Other | Admitting: Internal Medicine

## 2013-07-02 ENCOUNTER — Encounter: Payer: Self-pay | Admitting: Internal Medicine

## 2013-07-02 VITALS — BP 150/86 | HR 57 | Temp 97.9°F | Ht 75.0 in | Wt 217.8 lb

## 2013-07-02 DIAGNOSIS — R7309 Other abnormal glucose: Secondary | ICD-10-CM

## 2013-07-02 DIAGNOSIS — Z0000 Encounter for general adult medical examination without abnormal findings: Secondary | ICD-10-CM

## 2013-07-02 DIAGNOSIS — I10 Essential (primary) hypertension: Secondary | ICD-10-CM | POA: Diagnosis not present

## 2013-07-02 DIAGNOSIS — I4891 Unspecified atrial fibrillation: Secondary | ICD-10-CM | POA: Diagnosis not present

## 2013-07-02 DIAGNOSIS — Z86010 Personal history of colonic polyps: Secondary | ICD-10-CM | POA: Diagnosis not present

## 2013-07-02 DIAGNOSIS — E785 Hyperlipidemia, unspecified: Secondary | ICD-10-CM | POA: Diagnosis not present

## 2013-07-02 LAB — BASIC METABOLIC PANEL
BUN: 15 mg/dL (ref 6–23)
CO2: 29 mEq/L (ref 19–32)
Chloride: 98 mEq/L (ref 96–112)
Glucose, Bld: 122 mg/dL — ABNORMAL HIGH (ref 70–99)
Potassium: 4.5 mEq/L (ref 3.5–5.1)

## 2013-07-02 LAB — LIPID PANEL: HDL: 38.3 mg/dL — ABNORMAL LOW (ref >39.00)

## 2013-07-02 LAB — HEMOGLOBIN A1C: Hgb A1c MFr Bld: 7 % — ABNORMAL HIGH (ref 4.6–6.5)

## 2013-07-02 LAB — CBC WITH DIFFERENTIAL/PLATELET
Basophils Absolute: 0 10*3/uL (ref 0.0–0.1)
Eosinophils Absolute: 0.2 10*3/uL (ref 0.0–0.7)
Eosinophils Relative: 2.5 % (ref 0.0–5.0)
HCT: 42.2 % (ref 39.0–52.0)
Lymphs Abs: 0.9 10*3/uL (ref 0.7–4.0)
MCHC: 33.8 g/dL (ref 30.0–36.0)
MCV: 97.2 fl (ref 78.0–100.0)
Monocytes Absolute: 0.6 10*3/uL (ref 0.1–1.0)
Platelets: 160 10*3/uL (ref 150.0–400.0)
RDW: 13.4 % (ref 11.5–14.6)

## 2013-07-02 LAB — HEPATIC FUNCTION PANEL
ALT: 24 U/L (ref 0–53)
Total Bilirubin: 0.9 mg/dL (ref 0.3–1.2)

## 2013-07-02 LAB — TSH: TSH: 1.31 u[IU]/mL (ref 0.35–5.50)

## 2013-07-02 NOTE — Progress Notes (Signed)
Subjective:    Patient ID: Mike Day, male    DOB: Sep 02, 1946, 67 y.o.   MRN: [id number]  HPI Medicare Wellness Visit: Psychosocial and medical history were reviewed as required by Medicare (history related to abuse, antisocial behavior , firearm risk). Social history: Caffeine: 3 cups coffee & 1-2 colas , Alcohol: 7 drinks / week , Tobacco LKG:MWNU 2009 Exercise:see below Personal safety/fall risk:no Limitations of activities of daily living:no Seatbelt/ smoke alarm use:yes Healthcare Power of Attorney/Living Will status: in place Ophthalmologic exam status:pending Hearing evaluation status:not current Orientation: Oriented X3 Memory and recall: good Spelling  testing: good Depression/anxiety assessment: denied Foreign travel history:2011 [place] Immunization status for influenza/pneumonia/ shingles /tetanus: current Transfusion history:no Preventive health care maintenance status: Colonoscopy as per protocol/standard care: ? status Dental care:every 6 mos Chart reviewed and updated. Active issues reviewed and addressed as documented below.    Review of Systems A heart healthy diet is followed; exercise encompasses 55 minutes >3 times per week as exercise class or walking without symptoms. Specifically denied are  chest pain, palpitations, dyspnea, or claudication.  Family history is  negative for premature coronary disease. Advanced cholesterol testing not done to date . There is medication compliance with the statin. Significant abdominal symptoms, memory deficit, or myalgias denied. Some bitemporal headaches ? related to HTN or stress, relieved by Tylenol. No epistaxis,  paroxysmal nocturnal dyspnea, or edema.No lightheadedness or medication adverse effect described. Compliant with anti hypertemsive medication.Blood pressure range 120/80-158/86.    Objective:   Physical Exam  Gen.: Healthy and well-nourished in appearance. Alert, appropriate and cooperative throughout  exam.  Head: Normocephalic without obvious abnormalities; pattern alopecia  Eyes: No corneal or conjunctival inflammation noted. Pupils equal round reactive to light and accommodation.Ptosis OS. Extraocular motion intact.  Ears: External  ear exam reveals no significant lesions or deformities. Canals clear .TMs normal. Hearing is grossly normal bilaterally. Nose: External nasal exam reveals no deformity or inflammation. Nasal mucosa are pink and moist. No lesions or exudates noted.   Mouth: Oral mucosa and oropharynx reveal no lesions or exudates. Teeth in good repair. Neck: No deformities, masses, or tenderness noted. Range of motion & Thyroid normal. Lungs: Normal respiratory effort; chest expands symmetrically. Lungs are clear to auscultation without rales, wheezes, or increased work of breathing. Heart: Slow rate & irregular rhythm. Normal S1 and S2. No gallop, click, or rub. No murmur. Abdomen: Bowel sounds normal; abdomen soft and nontender. No masses, organomegaly or hernias noted. Genitalia: As per Dr Earlene Plater                                  Musculoskeletal/extremities:  Accentuated curvature of  thoracic spine. No clubbing, cyanosis, edema, or significant extremity  deformity noted. Range of motion normal .Tone & strength  Normal. Joints normal . Nail health good. Able to lie down & sit up w/o help. Negative SLR bilaterally Vascular: Carotid, radial artery, dorsalis pedis and  posterior tibial pulses are full and equal. No bruits present. Neurologic: Alert and oriented x3. Deep tendon reflexes symmetrical and normal.  Skin: Intact without suspicious lesions or rashes. Lymph: No cervical, axillary lymphadenopathy present. Psych: Mood and affect are normal. Normally interactive  Assessment & Plan:  #1 Medicare Wellness Exam; criteria met ; data entered #2 Problem List/Diagnoses reviewed Plan:   Assessments made/ Orders entered

## 2013-07-02 NOTE — Patient Instructions (Signed)
Your next office appointment will be determined based upon review of your pending labs . Those instructions will be transmitted to you through My Chart    Minimal Blood Pressure Goal= AVERAGE < 140/90;  Ideal is an AVERAGE < 135/85. This AVERAGE should be calculated from @ least 5-7 BP readings taken @ different times of day on different days of week. You should not respond to isolated BP readings , but rather the AVERAGE for that week .Please bring your  blood pressure cuff to office visits to verify that it is reliable.It  can also be checked against the blood pressure device at the pharmacy.  

## 2013-07-06 ENCOUNTER — Encounter: Payer: Self-pay | Admitting: Internal Medicine

## 2013-07-08 ENCOUNTER — Other Ambulatory Visit: Payer: Self-pay

## 2013-07-08 ENCOUNTER — Encounter: Payer: Self-pay | Admitting: Internal Medicine

## 2013-07-08 DIAGNOSIS — I4891 Unspecified atrial fibrillation: Secondary | ICD-10-CM

## 2013-07-08 MED ORDER — METOPROLOL TARTRATE 25 MG PO TABS: 25.0000 mg | ORAL_TABLET | Freq: Two times a day (BID) | ORAL | Status: DC

## 2013-07-08 NOTE — Telephone Encounter (Signed)
Dr. Alwyn Ren was informed of the message.//AB/CMA

## 2013-07-08 NOTE — Telephone Encounter (Signed)
See MyChart message on (07-08-13).//AB/CMA

## 2013-07-11 ENCOUNTER — Other Ambulatory Visit: Payer: Self-pay | Admitting: Internal Medicine

## 2013-07-11 DIAGNOSIS — H02839 Dermatochalasis of unspecified eye, unspecified eyelid: Secondary | ICD-10-CM | POA: Diagnosis not present

## 2013-07-11 DIAGNOSIS — H2589 Other age-related cataract: Secondary | ICD-10-CM | POA: Diagnosis not present

## 2013-07-11 DIAGNOSIS — H01029 Squamous blepharitis unspecified eye, unspecified eyelid: Secondary | ICD-10-CM | POA: Diagnosis not present

## 2013-07-11 NOTE — Telephone Encounter (Signed)
Lisinopril sent to pharmacy.

## 2013-08-12 ENCOUNTER — Ambulatory Visit (INDEPENDENT_AMBULATORY_CARE_PROVIDER_SITE_OTHER): Payer: Medicare Other | Admitting: Cardiovascular Disease

## 2013-08-12 ENCOUNTER — Telehealth: Payer: Self-pay | Admitting: *Deleted

## 2013-08-12 ENCOUNTER — Encounter: Payer: Self-pay | Admitting: Cardiovascular Disease

## 2013-08-12 VITALS — BP 128/88 | HR 56 | Ht 75.0 in | Wt 214.1 lb

## 2013-08-12 DIAGNOSIS — I4891 Unspecified atrial fibrillation: Secondary | ICD-10-CM

## 2013-08-12 DIAGNOSIS — Z8546 Personal history of malignant neoplasm of prostate: Secondary | ICD-10-CM | POA: Diagnosis not present

## 2013-08-12 DIAGNOSIS — I10 Essential (primary) hypertension: Secondary | ICD-10-CM | POA: Diagnosis not present

## 2013-08-12 NOTE — Telephone Encounter (Signed)
Message below copied from my chart message. I called pt to see what additional questions his wife may have. Pt states he was started on Eliquis today at office visit and he is wondering if he should continue ASA.  Pt with atrial fib but no history of CAD. I told him he should stop ASA when he starts Eliquis and  I would send message to Dr. Eden Emms to confirm this.  Pt reports his wife does not have any questions at this time.     Dan Maker    To Wendall Stade, MD [P [phone]    Composed 08/12/2013  4:33 PM    For Delivery On 08/12/2013  4:33 PM    Subject Non-Urgent Medical Question    Message Type Patient Medical Advice Request    Read Status Y    Message Body I have more questions now then before I saw Dr Eden Emms.  First of all, do I stop taking the aspirin since I will be taking the blood thinning drug?  My wife, who was in the lobby while I talked with the doctor has several questions.

## 2013-08-12 NOTE — Progress Notes (Signed)
Patient ID: Mike Day, male   DOB: 1945/10/05, 67 y.o.   MRN: [id number] 67 yo new to me.  Previously seen by Dr Ladona Ridgel.  He is friends with two of my patients Loistine Chance and Rolland Porter) and wanted to change cardiologist. Developed atrial flutter 7 years ago and underwent catheter ablation. He did well, maintaining sinus rhythm until he was discovered to be out of rhythm several in 2013  The patient did not have palpitations and was unaware that he had gone out of rhythm. He does note very vigorous physical activity. He does not have palpitations, shortness of breath, syncope, or chest pain. He was placed on systemic anticoagulation and begun on Lopressor. He has felt dizzy since starting his Lopressor.  Subsequently Dr Ladona Ridgel stopped his anticoagulation as his [place] score was one Has not had an echo in 5 years and then had mild LAE and normal EF.  No history of CAD.  No history of bleeding disorder. Labs in October including Cr and Hb normal.    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  All other systems reviewed and negative  General: Affect appropriate Healthy:  appears stated age HEENT: normal Neck supple with no adenopathy JVP normal no bruits no thyromegaly Lungs clear with no wheezing and good diaphragmatic motion Heart:  S1/S2 no murmur, no rub, gallop or click PMI normal Abdomen: benighn, BS positve, no tenderness, no AAA no bruit.  No HSM or HJR Distal pulses intact with no  bruits No edema Neuro non-focal Skin warm and dry No muscular weakness   Current Outpatient Prescriptions  Medication Sig Dispense Refill  . aspirin EC 325 MG tablet Take 1 tablet (325 mg total) by mouth daily.  30 tablet  0  . lisinopril (PRINIVIL,ZESTRIL) 40 MG tablet Take one tablet by mouth one time daily  90 tablet  2  . metoprolol tartrate (LOPRESSOR) 25 MG tablet Take 1 tablet (25 mg total) by mouth 2 (two) times daily.  60 tablet  6  . simvastatin (ZOCOR) 40 MG tablet TAKE ONE TABLET BY MOUTH NIGHTLY AT BEDTIME  90 tablet  3   No current facility-administered medications for this visit.    Allergies  Levofloxacin  Electrocardiogram:  afib rate 56 normal QRS complexes  Assessment and Plan

## 2013-08-12 NOTE — Assessment & Plan Note (Signed)
Good rate control  Discussed case personally with Dr Ladona Ridgel and he agreed that more recent literature supports anticoagulation especially if person low risk for bleeding Start eliquis 5 bid  Labs ok in October  Will also check echo since it has been 5 years

## 2013-08-12 NOTE — Patient Instructions (Signed)
Your physician wants you to follow-up in:   6  MONTHS WITH  DR Eden Emms =You will receive a reminder letter in the mail two months in advance. If you don't receive a letter, please call our office to schedule the follow-up appointment. Your physician has requested that you have an echocardiogram. Echocardiography is a painless test that uses sound waves to create images of your heart. It provides your doctor with information about the size and shape of your heart and how well your heart's chambers and valves are working. This procedure takes approximately one hour. There are no restrictions for this procedure. Your physician recommends that you continue on your current medications as directed. Please refer to the Current Medication list given to you today.

## 2013-08-12 NOTE — Telephone Encounter (Signed)
Yes stop asa

## 2013-08-12 NOTE — Assessment & Plan Note (Signed)
Well controlled.  Continue current medications and low sodium Dash type diet.    

## 2013-08-12 NOTE — Assessment & Plan Note (Signed)
NOrmal PSA  F/U Dr Earlene Plater Some residual sexual dysfunction

## 2013-08-13 NOTE — Telephone Encounter (Signed)
Spoke with pt and gave him information from Dr. Eden Emms

## 2013-08-21 ENCOUNTER — Other Ambulatory Visit: Payer: Self-pay | Admitting: Internal Medicine

## 2013-08-21 NOTE — Telephone Encounter (Signed)
Simvastatin refilled per protocol. JG//CMA 

## 2013-09-02 ENCOUNTER — Encounter: Payer: Self-pay | Admitting: Cardiology

## 2013-09-02 ENCOUNTER — Ambulatory Visit (HOSPITAL_COMMUNITY): Payer: Medicare Other | Attending: Cardiology | Admitting: Radiology

## 2013-09-02 DIAGNOSIS — I4891 Unspecified atrial fibrillation: Secondary | ICD-10-CM

## 2013-09-02 DIAGNOSIS — I079 Rheumatic tricuspid valve disease, unspecified: Secondary | ICD-10-CM | POA: Insufficient documentation

## 2013-09-02 DIAGNOSIS — I4892 Unspecified atrial flutter: Secondary | ICD-10-CM | POA: Insufficient documentation

## 2013-09-02 DIAGNOSIS — I10 Essential (primary) hypertension: Secondary | ICD-10-CM | POA: Insufficient documentation

## 2013-09-02 HISTORY — PX: TRANSTHORACIC ECHOCARDIOGRAM: SHX275

## 2013-09-02 NOTE — Progress Notes (Signed)
Echocardiogram performed.  

## 2013-09-05 ENCOUNTER — Encounter: Payer: Self-pay | Admitting: Cardiovascular Disease

## 2013-09-06 ENCOUNTER — Other Ambulatory Visit: Payer: Self-pay | Admitting: *Deleted

## 2013-09-06 MED ORDER — APIXABAN 5 MG PO TABS: 5.0000 mg | ORAL_TABLET | Freq: Two times a day (BID) | ORAL | Status: DC

## 2013-09-16 ENCOUNTER — Encounter: Payer: Self-pay | Admitting: Cardiovascular Disease

## 2013-09-16 ENCOUNTER — Other Ambulatory Visit: Payer: Self-pay

## 2013-09-16 ENCOUNTER — Encounter: Payer: Self-pay | Admitting: Internal Medicine

## 2013-09-16 MED ORDER — APIXABAN 5 MG PO TABS: 5.0000 mg | ORAL_TABLET | Freq: Two times a day (BID) | ORAL | Status: DC

## 2013-09-17 ENCOUNTER — Other Ambulatory Visit: Payer: Self-pay

## 2013-09-17 DIAGNOSIS — I4891 Unspecified atrial fibrillation: Secondary | ICD-10-CM

## 2013-09-17 MED ORDER — METOPROLOL TARTRATE 25 MG PO TABS: 25.0000 mg | ORAL_TABLET | Freq: Two times a day (BID) | ORAL | Status: DC

## 2013-09-18 ENCOUNTER — Other Ambulatory Visit: Payer: Self-pay | Admitting: *Deleted

## 2013-09-18 MED ORDER — LISINOPRIL 40 MG PO TABS: ORAL_TABLET | ORAL | Status: DC

## 2013-09-18 MED ORDER — SIMVASTATIN 40 MG PO TABS: ORAL_TABLET | ORAL | Status: DC

## 2013-10-10 ENCOUNTER — Encounter: Payer: Self-pay | Admitting: Internal Medicine

## 2014-02-20 ENCOUNTER — Other Ambulatory Visit: Payer: Self-pay | Admitting: Dermatology

## 2014-02-20 DIAGNOSIS — C44319 Basal cell carcinoma of skin of other parts of face: Secondary | ICD-10-CM | POA: Diagnosis not present

## 2014-02-20 DIAGNOSIS — L570 Actinic keratosis: Secondary | ICD-10-CM | POA: Diagnosis not present

## 2014-02-20 DIAGNOSIS — C4441 Basal cell carcinoma of skin of scalp and neck: Secondary | ICD-10-CM | POA: Diagnosis not present

## 2014-02-20 DIAGNOSIS — C44611 Basal cell carcinoma of skin of unspecified upper limb, including shoulder: Secondary | ICD-10-CM | POA: Diagnosis not present

## 2014-02-20 DIAGNOSIS — L820 Inflamed seborrheic keratosis: Secondary | ICD-10-CM | POA: Diagnosis not present

## 2014-02-20 DIAGNOSIS — D485 Neoplasm of uncertain behavior of skin: Secondary | ICD-10-CM | POA: Diagnosis not present

## 2014-02-20 DIAGNOSIS — C44519 Basal cell carcinoma of skin of other part of trunk: Secondary | ICD-10-CM | POA: Diagnosis not present

## 2014-02-20 DIAGNOSIS — Z85828 Personal history of other malignant neoplasm of skin: Secondary | ICD-10-CM | POA: Diagnosis not present

## 2014-02-20 DIAGNOSIS — C44711 Basal cell carcinoma of skin of unspecified lower limb, including hip: Secondary | ICD-10-CM | POA: Diagnosis not present

## 2014-02-24 ENCOUNTER — Encounter: Payer: Self-pay | Admitting: Internal Medicine

## 2014-03-18 ENCOUNTER — Ambulatory Visit (INDEPENDENT_AMBULATORY_CARE_PROVIDER_SITE_OTHER): Payer: Medicare Other | Admitting: Physician Assistant

## 2014-03-18 ENCOUNTER — Encounter: Payer: Self-pay | Admitting: Physician Assistant

## 2014-03-18 VITALS — BP 160/99 | HR 64 | Ht 75.0 in | Wt 215.0 lb

## 2014-03-18 DIAGNOSIS — I4891 Unspecified atrial fibrillation: Secondary | ICD-10-CM | POA: Diagnosis not present

## 2014-03-18 DIAGNOSIS — I10 Essential (primary) hypertension: Secondary | ICD-10-CM | POA: Diagnosis not present

## 2014-03-18 LAB — CBC WITH DIFFERENTIAL/PLATELET
BASOS ABS: 0 10*3/uL (ref 0.0–0.1)
Basophils Relative: 0.7 % (ref 0.0–3.0)
EOS PCT: 2.7 % (ref 0.0–5.0)
Eosinophils Absolute: 0.2 10*3/uL (ref 0.0–0.7)
HEMATOCRIT: 42.7 % (ref 39.0–52.0)
HEMOGLOBIN: 14.1 g/dL (ref 13.0–17.0)
LYMPHS ABS: 1.1 10*3/uL (ref 0.7–4.0)
LYMPHS PCT: 17.5 % (ref 12.0–46.0)
MCHC: 33 g/dL (ref 30.0–36.0)
MCV: 99.4 fl (ref 78.0–100.0)
MONOS PCT: 9.5 % (ref 3.0–12.0)
Monocytes Absolute: 0.6 10*3/uL (ref 0.1–1.0)
Neutro Abs: 4.4 10*3/uL (ref 1.4–7.7)
Neutrophils Relative %: 69.6 % (ref 43.0–77.0)
Platelets: 162 10*3/uL (ref 150.0–400.0)
RBC: 4.29 Mil/uL (ref 4.22–5.81)
RDW: 13.3 % (ref 11.5–15.5)
WBC: 6.3 10*3/uL (ref 4.0–10.5)

## 2014-03-18 MED ORDER — SIMVASTATIN 40 MG PO TABS: ORAL_TABLET | ORAL | Status: DC

## 2014-03-18 MED ORDER — METOPROLOL TARTRATE 25 MG PO TABS: 25.0000 mg | ORAL_TABLET | Freq: Two times a day (BID) | ORAL | Status: DC

## 2014-03-18 MED ORDER — APIXABAN 5 MG PO TABS: 5.0000 mg | ORAL_TABLET | Freq: Two times a day (BID) | ORAL | Status: DC

## 2014-03-18 MED ORDER — LISINOPRIL 40 MG PO TABS: ORAL_TABLET | ORAL | Status: DC

## 2014-03-18 NOTE — Progress Notes (Signed)
Cardiology Office Note    Date:  03/18/2014   ID:  FIELDS OROS, DOB 06-May-1946, MRN [id number]  PCP:  Unice Cobble, MD  Cardiologist:  Dr. Jenkins Rouge      History of Present Illness: Mike Day is a 68 y.o. male with a history of atrial flutter status post RFCA, chronic atrial fibrillation, HTN, HL. He established with Dr. Johnsie Cancel 08/2013. He was placed on Eliquis given stroke risk (CHADS2-VASc=2).  He returns for follow up.  He feels fatigued and notes his HR in the 40-50s at times.  BP at home is optimal.  He has been under a lot more stress this past week.  Also went to the dentist today (which is stressful for him).  He denies chest pain, dyspnea, orthopnea, PND, syncope. He notes some pedal edema that is unchanged.     Studies:   - Echo 09/02/13  - Left ventricle: Severe basal septal hypertrophy The cavity size was normal. Wall thickness was increased in a pattern of moderate LVH. Systolic function was normal. The estimated ejection fraction was in the range of 60% to 65%. - Left atrium: The atrium was moderately dilated. - Right atrium: The atrium was mildly dilated. - Atrial septum: No defect or patent foramen ovale was identified.    Recent Labs: 07/02/2013: ALT 24; Creatinine 0.8; HDL Cholesterol by NMR 38.30*; Hemoglobin 14.3; LDL (calc) 59; Potassium 4.5; TSH 1.31   Wt Readings from Last 3 Encounters:  03/18/14 215 lb (97.523 kg)  08/12/13 214 lb 1.9 oz (97.124 kg)  07/02/13 217 lb 12.8 oz (98.793 kg)     Past Medical History  Diagnosis Date  . Atrial flutter 2008    Dr Cristopher Peru  . Hyperlipidemia   . Hypertension   . Atrial fibrillation     Current Outpatient Prescriptions  Medication Sig Dispense Refill  . apixaban (ELIQUIS) 5 MG TABS tablet Take 1 tablet (5 mg total) by mouth 2 (two) times daily.  180 tablet  3  . Flaxseed, Linseed, (EQL FLAXSEED OIL) 1200 MG CAPS Take 1,200 mg by mouth daily.      [name] lisinopril (PRINIVIL,ZESTRIL) 40 MG  tablet Take one tablet by mouth one time daily  90 tablet  2  . metoprolol tartrate (LOPRESSOR) 25 MG tablet Take 1 tablet (25 mg total) by mouth 2 (two) times daily.  180 tablet  3  . simvastatin (ZOCOR) 40 MG tablet Take one tablet by mouth   nightly at bedtime  90 tablet  2   No current facility-administered medications for this visit.    Allergies:   Levofloxacin   Social History:  The patient  reports that he quit smoking about 6 years ago. He does not have any smokeless tobacco history on file. He reports that he drinks about 4.2 ounces of alcohol per week. He reports that he does not use illicit drugs.   Family History:  The patient's family history includes Diabetes in his mother; Emphysema in his father; Heart attack (age of onset: 72) in his brother; Heart disease in his father; Hypertension in his brother; Lung disease in his father; Stroke (age of onset: 62) in his mother. There is no history of Cancer.   ROS:  Please see the history of present illness.   No bleeding.  No snoring.    All other systems reviewed and negative.   PHYSICAL EXAM: VS:  BP 160/99  Pulse 64  Ht 6\' 3"  (1.905 m)  Wt 215 lb (  97.523 kg)  BMI 26.87 kg/m2 Well nourished, well developed, in no acute distress HEENT: normal Neck: no JVD Cardiac:  normal S1, S2; irreg irreg rhythm; no murmur Lungs:  clear to auscultation bilaterally, no wheezing, rhonchi or rales Abd: soft, nontender, no hepatomegaly Ext: no edema Skin: warm and dry Neuro:  CNs 2-12 intact, no focal abnormalities noted  EKG:  AFib, HR 64, PVC     ASSESSMENT AND PLAN:  1. Chronic Atrial Fibrillation:  His rate is slow at times.  He does feel fatigued.  I will reduced Metoprolol Tartrate to 12.5 mg bid.  Continue Eliquis.  Check BMET, CBC today.   2. Essential hypertension, benign:  BP elevated. This typically is optimal at home.  He will continue to monitor and let us know if it continues to run high. 3. Disposition:  F/u with Dr. Jenkins Rouge in 6 mos.    Signed, Versie Starks, MHS 03/18/2014 3:20 PM    [facility] [place], [place], [place]  [address] Phone: [phone]; Fax: [phone]

## 2014-03-18 NOTE — Patient Instructions (Signed)
LAB WORK TODAY; BMET, CBC W/DIFF  DECREASE METOPROLOL TO 12.5 MG TWICE DAILY;  REFILLS SENT IN TO [facility] FOR METOPROLOL, ELIQUIS, LISINOPRIL, SIMVASTATIN  MONITOR BP AND CALL IF ABOVE 140/90  Your physician wants you to follow-up in: 6 MONTHS WITH DR. Johnsie Cancel You will receive a reminder letter in the mail two months in advance. If you don't receive a letter, please call our office to schedule the follow-up appointment.

## 2014-03-19 LAB — BASIC METABOLIC PANEL
BUN: 11 mg/dL (ref 6–23)
CO2: 30 mEq/L (ref 19–32)
CREATININE: 0.8 mg/dL (ref 0.4–1.5)
Calcium: 9.3 mg/dL (ref 8.4–10.5)
Chloride: 102 mEq/L (ref 96–112)
GFR: 96.6 mL/min (ref >60.00)
Glucose, Bld: 119 mg/dL — ABNORMAL HIGH (ref 70–99)
Potassium: 3.9 mEq/L (ref 3.5–5.1)
Sodium: 136 mEq/L (ref 135–145)

## 2014-03-20 ENCOUNTER — Telehealth: Payer: Self-pay | Admitting: *Deleted

## 2014-03-20 NOTE — Telephone Encounter (Signed)
pt's wife notified about lab results, she tells me today they already saw the results on [place]. Wife also tells me pt's BP this AM was 123/80, I said that was great.

## 2014-03-21 ENCOUNTER — Encounter: Payer: Self-pay | Admitting: Gastroenterology

## 2014-04-01 ENCOUNTER — Encounter: Payer: Self-pay | Admitting: Gastroenterology

## 2014-04-10 ENCOUNTER — Encounter: Payer: Self-pay | Admitting: *Deleted

## 2014-04-14 ENCOUNTER — Encounter: Payer: Self-pay | Admitting: Gastroenterology

## 2014-04-14 ENCOUNTER — Ambulatory Visit: Payer: Medicare Other | Admitting: Gastroenterology

## 2014-04-14 ENCOUNTER — Telehealth: Payer: Self-pay | Admitting: *Deleted

## 2014-04-14 VITALS — BP 160/110 | HR 60 | Ht 74.0 in | Wt 214.0 lb

## 2014-04-14 DIAGNOSIS — Z860100 Personal history of colon polyps, unspecified: Secondary | ICD-10-CM

## 2014-04-14 DIAGNOSIS — Z7901 Long term (current) use of anticoagulants: Secondary | ICD-10-CM

## 2014-04-14 MED ORDER — MOVIPREP 100 G PO SOLR: 1.0000 | Freq: Once | ORAL | Status: DC

## 2014-04-14 NOTE — Progress Notes (Signed)
Reviewed and agree with management plan.  Brocha Gilliam T. Charlyn Vialpando, MD FACG 

## 2014-04-14 NOTE — Progress Notes (Signed)
04/14/2014 Mike Day [id number] [birth year]   HISTORY OF PRESENT ILLNESS:  This is a 68 year old male who is known to Dr. Fuller Plan for previous colonoscopy.  Had adenomatous polyps removed on colonoscopy in 05/2009.  Also had diverticulosis.  Repeat colonoscopy was recommended in 5 years from that time.  He is here today to schedule colonoscopy.  Is on Eliquis for almost a year due to atrial fibrillation/flutter, which is controlled by Dr. Johnsie Cancel.  No GI complaints.   Past Medical History  Diagnosis Date  . Atrial flutter 2008    Dr Cristopher Peru  . Hyperlipidemia   . Hypertension   . Atrial fibrillation   . Colon polyps     ADENOMATOUS POLYPS (3)   Past Surgical History  Procedure Laterality Date  . 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 Mohs surgery      left hand  . Colonoscopy w/ polypectomy  2003    [facility]  . Septoplasty  Age 40  . Hernia repair  07/2008    Dr.Martin  . 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 Colonoscopy  2010    reports that he quit smoking about 6 years ago. He does not have any smokeless tobacco history on file. He reports that he drinks about 4.2 ounces of alcohol per week. He reports that he does not use illicit drugs. family history includes Diabetes in his mother; Emphysema in his father; Heart attack (age of onset: 66) in his brother; Heart disease in his father; Hypertension in his brother; Lung disease in his father; Stroke (age of onset: 45) in his mother. There is no history of Cancer. Allergies  Allergen Reactions  . Levofloxacin     REACTION: Rash  . Tape Other (See Comments)    ADHESIVE TAPE; BLISTERS,      Outpatient Encounter Prescriptions as of 04/14/2014  Medication Sig  . apixaban (ELIQUIS) 5 MG TABS tablet Take 1 tablet (5 mg total) by mouth 2 (two) times daily.  . Flaxseed, Linseed, (EQL FLAXSEED OIL) 1200 MG CAPS Take 1,200 mg by mouth daily.  [name] lisinopril  (PRINIVIL,ZESTRIL) 40 MG tablet Take one tablet by mouth one time daily  . metoprolol tartrate (LOPRESSOR) 25 MG tablet Take 1 tablet (25 mg total) by mouth 2 (two) times daily.  . simvastatin (ZOCOR) 40 MG tablet Take one tablet by mouth   nightly at bedtime  . Triamcinolone Acetonide (NASACORT AQ NA) Place 1 spray into the nose as needed.     REVIEW OF SYSTEMS  : All other systems reviewed and negative except where noted in the History of Present Illness.   PHYSICAL EXAM: BP 160/110  Pulse 60  Ht 6\' 2"  (1.88 m)  Wt 214 lb (97.07 kg)  BMI 27.46 kg/m2 General: Well developed white male in no acute distress Head: Normocephalic and atraumatic Eyes:  Sclerae anicteric,conjunctive pink. Ears: Normal auditory acuity Lungs: Clear throughout to auscultation Heart: Regular rate and rhythm Abdomen: Soft, non-distended.  Normal bowel sounds.  Non-tender. Rectal:  Deferred.  Will be done at the time of colonoscopy. Musculoskeletal: Symmetrical with no gross deformities  Skin: No lesions on visible extremities Extremities: No edema  Neurological: Alert oriented x 4, grossly non-focal Psychological:  Alert and cooperative. Normal mood and affect  ASSESSMENT AND PLAN: -Personal history of colon polyps:  Adenomas in 05/2009.  Will schedule colonoscopy with Dr. Fuller Plan.  The  risks, benefits, and alternatives were discussed with the patient and he consents to proceed.  The risks benefits and alternatives to a temporary hold of anti-coagulants/anti-platelets for the procedure were discussed with the patient he consents to proceed. Obtain clearance from Dr. Johnsie Cancel for ok to hold Eliquis for 24-48 hours.

## 2014-04-14 NOTE — Patient Instructions (Addendum)
You have been scheduled for a colonoscopy with Dr. Fuller Plan. Please follow written instructions given to you at your visit today.  Please pick up your prep kit at the pharmacy within the next 1-3 days. If you use inhalers (even only as needed), please bring them with you on the day of your procedure. Your physician has requested that you go to www.startemmi.com and enter the access code given to you at your visit today. This web site gives a general overview about your procedure. However, you should still follow specific instructions given to you by our office regarding your preparation for the procedure.

## 2014-04-14 NOTE — Telephone Encounter (Signed)
Ok to hold eliquis 3 days before colonoscopy ----- Message ----- From: Carlyle Dolly, CMA Sent: 04/14/2014 9:11 AM To: Josue Hector, MD   Patient notified per Dr. Johnsie Cancel its okay to hold Eliquis three days before procedure Patient verbalized understanding

## 2014-04-14 NOTE — Telephone Encounter (Signed)
04/14/2014   RE: Mike Day DOB: [birth year] MRN: [id number]   Dear Dr. Johnsie Cancel,    We have scheduled the above patient for an Colonoscopy. Our records show that he is on anticoagulation therapy.   Please advise as to how long the patient may come off his therapy of Eliquis prior to the procedure, which is scheduled for 05-05-2014.  Please fax back/ or route the completed form to Evette Georges., CMA    Sincerely,    Hope Pigeon

## 2014-05-05 ENCOUNTER — Encounter: Payer: Self-pay | Admitting: Gastroenterology

## 2014-05-05 ENCOUNTER — Ambulatory Visit: Payer: Medicare Other | Admitting: Gastroenterology

## 2014-05-05 VITALS — BP 138/75 | HR 49 | Temp 98.5°F | Resp 21 | Ht 74.0 in | Wt 214.0 lb

## 2014-05-05 DIAGNOSIS — E669 Obesity, unspecified: Secondary | ICD-10-CM | POA: Diagnosis not present

## 2014-05-05 DIAGNOSIS — Z86010 Personal history of colonic polyps: Secondary | ICD-10-CM | POA: Diagnosis not present

## 2014-05-05 DIAGNOSIS — I4892 Unspecified atrial flutter: Secondary | ICD-10-CM | POA: Diagnosis not present

## 2014-05-05 DIAGNOSIS — D126 Benign neoplasm of colon, unspecified: Secondary | ICD-10-CM | POA: Diagnosis not present

## 2014-05-05 DIAGNOSIS — D123 Benign neoplasm of transverse colon: Secondary | ICD-10-CM

## 2014-05-05 DIAGNOSIS — I4891 Unspecified atrial fibrillation: Secondary | ICD-10-CM | POA: Diagnosis not present

## 2014-05-05 DIAGNOSIS — I10 Essential (primary) hypertension: Secondary | ICD-10-CM | POA: Diagnosis not present

## 2014-05-05 DIAGNOSIS — D125 Benign neoplasm of sigmoid colon: Secondary | ICD-10-CM

## 2014-05-05 DIAGNOSIS — D120 Benign neoplasm of cecum: Secondary | ICD-10-CM

## 2014-05-05 MED ORDER — SODIUM CHLORIDE 0.9 % IV SOLN: 500.0000 mL | INTRAVENOUS | Status: DC

## 2014-05-05 NOTE — Patient Instructions (Signed)
Discharge instructions given with verbal understanding. Handouts on polyps and diverticulosis. Resume previous medications. Hold Eliquis for two days. YOU HAD AN ENDOSCOPIC PROCEDURE TODAY AT [facility]: Refer to the procedure report that was given to you for any specific questions about what was found during the examination.  If the procedure report does not answer your questions, please call your gastroenterologist to clarify.  If you requested that your care partner not be given the details of your procedure findings, then the procedure report has been included in a sealed envelope for you to review at your convenience later.  YOU SHOULD EXPECT: Some feelings of bloating in the abdomen. Passage of more gas than usual.  Walking can help get rid of the air that was put into your GI tract during the procedure and reduce the bloating. If you had a lower endoscopy (such as a colonoscopy or flexible sigmoidoscopy) you may notice spotting of blood in your stool or on the toilet paper. If you underwent a bowel prep for your procedure, then you may not have a normal bowel movement for a few days.  DIET: Your first meal following the procedure should be a light meal and then it is ok to progress to your normal diet.  A half-sandwich or bowl of soup is an example of a good first meal.  Heavy or fried foods are harder to digest and may make you feel nauseous or bloated.  Likewise meals heavy in dairy and vegetables can cause extra gas to form and this can also increase the bloating.  Drink plenty of fluids but you should avoid alcoholic beverages for 24 hours.  ACTIVITY: Your care partner should take you home directly after the procedure.  You should plan to take it easy, moving slowly for the rest of the day.  You can resume normal activity the day after the procedure however you should NOT DRIVE or use heavy machinery for 24 hours (because of the sedation medicines used during the test).     SYMPTOMS TO REPORT IMMEDIATELY: A gastroenterologist can be reached at any hour.  During normal business hours, 8:30 AM to 5:00 PM Monday through Friday, call [phone].  After hours and on weekends, please call the GI answering service at [phone] who will take a message and have the physician on call contact you.   Following lower endoscopy (colonoscopy or flexible sigmoidoscopy):  Excessive amounts of blood in the stool  Significant tenderness or worsening of abdominal pains  Swelling of the abdomen that is new, acute  Fever of 100F or higher  FOLLOW UP: If any biopsies were taken you will be contacted by phone or by letter within the next 1-3 weeks.  Call your gastroenterologist if you have not heard about the biopsies in 3 weeks.  Our staff will call the home number listed on your records the next business day following your procedure to check on you and address any questions or concerns that you may have at that time regarding the information given to you following your procedure. This is a courtesy call and so if there is no answer at the home number and we have not heard from you through the emergency physician on call, we will assume that you have returned to your regular daily activities without incident.  SIGNATURES/CONFIDENTIALITY: You and/or your care partner have signed paperwork which will be entered into your electronic medical record.  These signatures attest to the fact that that the information above  on your After Visit Summary has been reviewed and is understood.  Full responsibility of the confidentiality of this discharge information lies with you and/or your care-partner.  Diverticulosis Diverticulosis is the condition that develops when small pouches (diverticula) form in the wall of your colon. Your colon, or large intestine, is where water is absorbed and stool is formed. The pouches form when the inside layer of your colon pushes through weak spots in the  outer layers of your colon. CAUSES  No one knows exactly what causes diverticulosis. RISK FACTORS  Being older than 62. Your risk for this condition increases with age. Diverticulosis is rare in people younger than 40 years. By age 19, almost everyone has it.  Eating a low-fiber diet.  Being frequently constipated.  Being overweight.  Not getting enough exercise.  Smoking.  Taking over-the-counter pain medicines, like aspirin and ibuprofen. SYMPTOMS  Most people with diverticulosis do not have symptoms. DIAGNOSIS  Because diverticulosis often has no symptoms, health care providers often discover the condition during an exam for other colon problems. In many cases, a health care provider will diagnose diverticulosis while using a flexible scope to examine the colon (colonoscopy). TREATMENT  If you have never developed an infection related to diverticulosis, you may not need treatment. If you have had an infection before, treatment may include:  Eating more fruits, vegetables, and grains.  Taking a fiber supplement.  Taking a live bacteria supplement (probiotic).  Taking medicine to relax your colon. HOME CARE INSTRUCTIONS   Drink at least 6-8 glasses of water each day to prevent constipation.  Try not to strain when you have a bowel movement.  Keep all follow-up appointments. If you have had an infection before:  Increase the fiber in your diet as directed by your health care provider or dietitian.  Take a dietary fiber supplement if your health care provider approves.  Only take medicines as directed by your health care provider. SEEK MEDICAL CARE IF:   You have abdominal pain.  You have bloating.  You have cramps.  You have not gone to the bathroom in 3 days. SEEK IMMEDIATE MEDICAL CARE IF:   Your pain gets worse.  Yourbloating becomes very bad.  You have a fever or chills, and your symptoms suddenly get worse.  You begin vomiting.  You have bowel  movements that are bloody or black. MAKE SURE YOU:  Understand these instructions.  Will watch your condition.  Will get help right away if you are not doing well or get worse. Document Released: 05/19/2004 Document Revised: 08/27/2013 Document Reviewed: 07/17/2013 [facility] Patient Information 2015 [place], [place]. This information is not intended to replace advice given to you by your health care provider. Make sure you discuss any questions you have with your health care provider.

## 2014-05-05 NOTE — Op Note (Signed)
[place]  [address]. [place], [address]   COLONOSCOPY PROCEDURE REPORT PATIENT: Mike Day, Mike Day  MR#: [id number] BIRTHDATE: 12/27/1945 , 31  yrs. old GENDER: Male ENDOSCOPIST: Ladene Artist, MD, [facility] PROCEDURE DATE:  05/05/2014 PROCEDURE:   Colonoscopy with biopsy and snare polypectomy First Screening Colonoscopy - Avg.  risk and is 50 yrs.  old or older - No.  Prior Negative Screening - Now for repeat screening. N/A  History of Adenoma - Now for follow-up colonoscopy & has been > or = to 3 yrs.  Yes hx of adenoma.  Has been 3 or more years since last colonoscopy.  Polyps Removed Today? Yes. ASA CLASS:   Class III INDICATIONS:Patient's personal history of adenomatous colon polyps.  MEDICATIONS: MAC sedation, administered by CRNA and propofol (Diprivan) 230mg  IV DESCRIPTION OF PROCEDURE:   After the risks benefits and alternatives of the procedure were thoroughly explained, informed consent was obtained.  A digital rectal exam revealed no abnormalities of the rectum.   The [facility] [id number]  endoscope was introduced through the anus and advanced to the cecum, which was identified by both the appendix and ileocecal valve. No adverse events experienced.   The quality of the prep was good, using MoviPrep  The instrument was then slowly withdrawn as the colon was fully examined.  COLON FINDINGS: A sessile polyp measuring 7 mm in size was found at the cecum.  A polypectomy was performed using snare cautery.  The resection was complete and the polyp tissue was completely retrieved.   Two sessile polyps measuring 4-6 mm in size were found in the transverse colon.  A polypectomy was performed with a cold snare and with cold forceps.  The resection was complete and the polyp tissue was completely retrieved.   Two sessile polyps measuring 3-4 mm in size were found in the sigmoid colon.  A polypectomy was performed with cold forceps.  The resection was complete  and the polyp tissue was completely retrieved.   Moderate diverticulosis was noted in the descending colon and sigmoid colon. The colon was otherwise normal.  There was no diverticulosis, inflammation, polyps or cancers unless previously stated. Retroflexed views revealed small internal hemorrhoids. The time to cecum=2 minutes 46 seconds.  Withdrawal time=14 minutes 18 seconds. The scope was withdrawn and the procedure completed. COMPLICATIONS: There were no complications. ENDOSCOPIC IMPRESSION: 1.   Sessile polyp measuring 7 mm at the cecum; polypectomy performed using snare cautery 2.   Two sessile polyps measuring 4-6 mm in the transverse colon; polypectomy performed by cold snare and cold forceps 3.   Two sessile polyps measuring 3-4 mm in the sigmoid colon; polypectomy performed with cold forceps 4.   Moderate diverticulosis in the descending colon and sigmoid colon 5.   Small internal hemorrhoids RECOMMENDATIONS: 1.  Await pathology results 2.  Hold aspirin, aspirin products, and anti-inflammatory medication for 2 weeks. 3.  High fiber diet with liberal fluid intake. 4.  Repeat Colonoscopy in 5 years. 5.  Resume Eliquis in 2 days eSigned:  Ladene Artist, MD, [facility] 05/05/2014 8:31 AM    PATIENT NAME:  Mike Day, Mike Day MR#: [id number]

## 2014-05-05 NOTE — Progress Notes (Signed)
Report to PACU, RN, vss, BBS= Clear.  

## 2014-05-05 NOTE — Progress Notes (Signed)
Called to room to assist during endoscopic procedure.  Patient ID and intended procedure confirmed with present staff. Received instructions for my participation in the procedure from the performing physician.  

## 2014-05-06 ENCOUNTER — Telehealth: Payer: Self-pay | Admitting: *Deleted

## 2014-05-06 ENCOUNTER — Ambulatory Visit: Payer: Medicare Other

## 2014-05-06 ENCOUNTER — Ambulatory Visit (INDEPENDENT_AMBULATORY_CARE_PROVIDER_SITE_OTHER): Payer: Medicare Other

## 2014-05-06 DIAGNOSIS — Z23 Encounter for immunization: Secondary | ICD-10-CM

## 2014-05-06 NOTE — Telephone Encounter (Signed)
  Follow up Call-  Call back number 05/05/2014  Post procedure Call Back phone  # [phone]  Permission to leave phone message Yes     Patient questions:  Do you have a fever, pain , or abdominal swelling? No. Pain Score  0 *  Have you tolerated food without any problems? Yes.    Have you been able to return to your normal activities? Yes.    Do you have any questions about your discharge instructions: Diet   No. Medications  No. Follow up visit  No.  Do you have questions or concerns about your Care? No.  Actions: * If pain score is 4 or above: No action needed, pain <4.

## 2014-05-09 ENCOUNTER — Encounter: Payer: Self-pay | Admitting: Gastroenterology

## 2014-05-21 DIAGNOSIS — Z8546 Personal history of malignant neoplasm of prostate: Secondary | ICD-10-CM | POA: Diagnosis not present

## 2014-05-21 DIAGNOSIS — C61 Malignant neoplasm of prostate: Secondary | ICD-10-CM | POA: Diagnosis not present

## 2014-07-01 DIAGNOSIS — H02831 Dermatochalasis of right upper eyelid: Secondary | ICD-10-CM | POA: Diagnosis not present

## 2014-07-01 DIAGNOSIS — H02834 Dermatochalasis of left upper eyelid: Secondary | ICD-10-CM | POA: Diagnosis not present

## 2014-07-01 DIAGNOSIS — H0236 Blepharochalasis left eye, unspecified eyelid: Secondary | ICD-10-CM | POA: Diagnosis not present

## 2014-07-01 DIAGNOSIS — H0230 Blepharochalasis unspecified eye, unspecified eyelid: Secondary | ICD-10-CM | POA: Insufficient documentation

## 2014-07-01 DIAGNOSIS — H02833 Dermatochalasis of right eye, unspecified eyelid: Secondary | ICD-10-CM | POA: Diagnosis not present

## 2014-07-01 DIAGNOSIS — H0233 Blepharochalasis right eye, unspecified eyelid: Secondary | ICD-10-CM | POA: Diagnosis not present

## 2014-07-01 DIAGNOSIS — Z7901 Long term (current) use of anticoagulants: Secondary | ICD-10-CM | POA: Diagnosis not present

## 2014-07-01 DIAGNOSIS — H02836 Dermatochalasis of left eye, unspecified eyelid: Secondary | ICD-10-CM | POA: Diagnosis not present

## 2014-07-01 HISTORY — DX: Dermatochalasis of right upper eyelid: H02.831

## 2014-07-01 HISTORY — DX: Dermatochalasis of left upper eyelid: H02.834

## 2014-07-03 ENCOUNTER — Encounter: Payer: Self-pay | Admitting: Internal Medicine

## 2014-07-03 ENCOUNTER — Encounter: Payer: Medicare Other | Admitting: Internal Medicine

## 2014-07-03 ENCOUNTER — Ambulatory Visit (INDEPENDENT_AMBULATORY_CARE_PROVIDER_SITE_OTHER): Payer: Medicare Other | Admitting: Internal Medicine

## 2014-07-03 VITALS — BP 134/100 | HR 53 | Temp 97.8°F | Resp 14 | Ht 75.0 in | Wt 212.2 lb

## 2014-07-03 DIAGNOSIS — M791 Myalgia: Secondary | ICD-10-CM

## 2014-07-03 DIAGNOSIS — Z23 Encounter for immunization: Secondary | ICD-10-CM

## 2014-07-03 DIAGNOSIS — I10 Essential (primary) hypertension: Secondary | ICD-10-CM | POA: Diagnosis not present

## 2014-07-03 DIAGNOSIS — R739 Hyperglycemia, unspecified: Secondary | ICD-10-CM

## 2014-07-03 DIAGNOSIS — D126 Benign neoplasm of colon, unspecified: Secondary | ICD-10-CM | POA: Diagnosis not present

## 2014-07-03 DIAGNOSIS — IMO0001 Reserved for inherently not codable concepts without codable children: Secondary | ICD-10-CM

## 2014-07-03 DIAGNOSIS — M609 Myositis, unspecified: Secondary | ICD-10-CM

## 2014-07-03 DIAGNOSIS — E785 Hyperlipidemia, unspecified: Secondary | ICD-10-CM | POA: Diagnosis not present

## 2014-07-03 NOTE — Progress Notes (Signed)
Pre visit review using our clinic review tool, if applicable. No additional management support is needed unless otherwise documented below in the visit note. 

## 2014-07-03 NOTE — Patient Instructions (Addendum)
Minimal Blood Pressure Goal= AVERAGE < 140/90;  Ideal is an AVERAGE < 135/85. This AVERAGE should be calculated from @ least 5-7 BP readings taken @ different times of day on different days of week. You should not respond to isolated BP readings , but rather the AVERAGE for that week .Please bring your  blood pressure cuff to office visits to verify that it is reliable.It  can also be checked against the blood pressure device at the pharmacy. Finger or wrist cuffs are not dependable; an arm cuff is. Please perform isometric exercises before going to bed. Sit on side of the bed and raise up on toes to a count of 5. Then put pressure on the heels to a count of 5. Repeat this process 10 times. This will improve blood flow to the calves & help prevent cramps.  Your next office appointment will be determined based upon review of your pending labs . Those instructions will be transmitted to you through My Chart.

## 2014-07-03 NOTE — Assessment & Plan Note (Signed)
A1c

## 2014-07-03 NOTE — Progress Notes (Signed)
   Subjective:    Patient ID: Mike Day, male    DOB: [birth year], 68 y.o.   MRN: [id number]  HPI He is here to assess active health issues & conditions. PMH, FH, & Social history verified & updated   He is on heart healthy diet and restrict sodium. He exercises walking 10,000 steps 3-4 times per week with no cardiopulmonary symptoms.  He does have occasional leg cramps at night.  Blood pressure ranges 120/80-155/120. He has been compliant with his statin as well as his antihypertensive medications without adverse effect.    Review of Systems  Significant headaches, epistaxis, chest pain, palpitations, exertional dyspnea, claudication, paroxysmal nocturnal dyspnea, or edema absent. No GI symptoms or  memory loss.         Objective:   Physical Exam    Gen.: Healthy and well-nourished in appearance. Alert, appropriate and cooperative throughout exam. Appears younger than stated age  Head: Normocephalic without obvious abnormalities;  Pattern alopecia  Eyes: OS ptosis > OD.No corneal or conjunctival inflammation noted. Pupils equal round reactive to light and accommodation. Extraocular motion intact.  Ears: External  ear exam reveals no significant lesions or deformities. Canals clear .TMs normal. Hearing is grossly normal bilaterally. Nose: External nasal exam reveals no deformity or inflammation. Nasal mucosa are pink and moist. No lesions or exudates noted.   Mouth: Oral mucosa and oropharynx reveal no lesions or exudates. Teeth in good repair. Neck: No deformities, masses, or tenderness noted. Range of motion & Thyroid normal. Lungs: Normal respiratory effort; chest expands symmetrically. Lungs are clear to auscultation without rales, wheezes, or increased work of breathing. Heart: Slow & minimally irregular rate & rhythm. Normal S1 and S2. No gallop, click, or rub. No murmur. Abdomen: Bowel sounds normal; abdomen soft and nontender. No masses, organomegaly or hernias  noted. Genitalia: as per Urology                                  Musculoskeletal/extremities: No deformity or scoliosis noted of  the thoracic or lumbar spine.  No clubbing, cyanosis, edema, or significant extremity  deformity noted. Range of motion normal .Tone & strength normal. Hand joints normal  Fingernail / toenail health good. Able to lie down & sit up w/o help. Negative SLR bilaterally Vascular: Carotid, radial artery, dorsalis pedis and  posterior tibial pulses are full and equal. No bruits present. Neurologic: Alert and oriented x3. Deep tendon reflexes symmetrical but 0-1/2 @ knees.  Gait normal  .      Skin: Intact without suspicious lesions or rashes.Keratotic hand lesions Lymph: No cervical, axillary lymphadenopathy present. Psych: Mood and affect are normal. Normally interactive                                                                                      Assessment & Plan:  See Current Assessment & Plan in Problem List under specific Diagnosis

## 2014-07-03 NOTE — Assessment & Plan Note (Signed)
Lipids , TSH 

## 2014-07-04 ENCOUNTER — Telehealth: Payer: Self-pay | Admitting: Internal Medicine

## 2014-07-04 NOTE — Telephone Encounter (Signed)
emmi emailed °

## 2014-07-16 DIAGNOSIS — L570 Actinic keratosis: Secondary | ICD-10-CM | POA: Diagnosis not present

## 2014-07-16 DIAGNOSIS — L821 Other seborrheic keratosis: Secondary | ICD-10-CM | POA: Diagnosis not present

## 2014-07-16 DIAGNOSIS — Z85828 Personal history of other malignant neoplasm of skin: Secondary | ICD-10-CM | POA: Diagnosis not present

## 2014-09-01 DIAGNOSIS — L570 Actinic keratosis: Secondary | ICD-10-CM | POA: Diagnosis not present

## 2014-09-07 ENCOUNTER — Encounter: Payer: Self-pay | Admitting: Physician Assistant

## 2014-09-09 ENCOUNTER — Other Ambulatory Visit: Payer: Self-pay | Admitting: *Deleted

## 2014-09-09 DIAGNOSIS — I4891 Unspecified atrial fibrillation: Secondary | ICD-10-CM

## 2014-09-09 MED ORDER — APIXABAN 5 MG PO TABS: 5.0000 mg | ORAL_TABLET | Freq: Two times a day (BID) | ORAL | Status: DC

## 2014-09-09 MED ORDER — LISINOPRIL 40 MG PO TABS: ORAL_TABLET | ORAL | Status: DC

## 2014-09-09 MED ORDER — SIMVASTATIN 40 MG PO TABS: ORAL_TABLET | ORAL | Status: DC

## 2014-09-09 MED ORDER — METOPROLOL TARTRATE 25 MG PO TABS: 25.0000 mg | ORAL_TABLET | Freq: Two times a day (BID) | ORAL | Status: DC

## 2014-09-29 DIAGNOSIS — L570 Actinic keratosis: Secondary | ICD-10-CM | POA: Diagnosis not present

## 2014-10-07 DIAGNOSIS — I10 Essential (primary) hypertension: Secondary | ICD-10-CM | POA: Diagnosis not present

## 2014-10-07 DIAGNOSIS — C61 Malignant neoplasm of prostate: Secondary | ICD-10-CM | POA: Diagnosis not present

## 2014-10-07 DIAGNOSIS — N259 Disorder resulting from impaired renal tubular function, unspecified: Secondary | ICD-10-CM | POA: Diagnosis not present

## 2014-10-07 DIAGNOSIS — I4821 Permanent atrial fibrillation: Secondary | ICD-10-CM | POA: Insufficient documentation

## 2014-10-07 DIAGNOSIS — H02834 Dermatochalasis of left upper eyelid: Secondary | ICD-10-CM | POA: Diagnosis not present

## 2014-10-07 DIAGNOSIS — I4891 Unspecified atrial fibrillation: Secondary | ICD-10-CM | POA: Diagnosis not present

## 2014-10-07 DIAGNOSIS — H0233 Blepharochalasis right eye, unspecified eyelid: Secondary | ICD-10-CM | POA: Diagnosis not present

## 2014-10-07 DIAGNOSIS — Z7901 Long term (current) use of anticoagulants: Secondary | ICD-10-CM | POA: Diagnosis not present

## 2014-10-07 DIAGNOSIS — H02831 Dermatochalasis of right upper eyelid: Secondary | ICD-10-CM | POA: Diagnosis not present

## 2014-10-07 DIAGNOSIS — Z888 Allergy status to other drugs, medicaments and biological substances status: Secondary | ICD-10-CM | POA: Diagnosis not present

## 2014-10-07 DIAGNOSIS — H0236 Blepharochalasis left eye, unspecified eyelid: Secondary | ICD-10-CM | POA: Diagnosis not present

## 2014-10-07 HISTORY — DX: Unspecified atrial fibrillation: I48.91

## 2014-10-13 DIAGNOSIS — L570 Actinic keratosis: Secondary | ICD-10-CM | POA: Diagnosis not present

## 2014-10-15 DIAGNOSIS — H0234 Blepharochalasis left upper eyelid: Secondary | ICD-10-CM | POA: Diagnosis not present

## 2014-10-15 DIAGNOSIS — C61 Malignant neoplasm of prostate: Secondary | ICD-10-CM | POA: Diagnosis not present

## 2014-10-15 DIAGNOSIS — H0236 Blepharochalasis left eye, unspecified eyelid: Secondary | ICD-10-CM | POA: Diagnosis not present

## 2014-10-15 DIAGNOSIS — H0233 Blepharochalasis right eye, unspecified eyelid: Secondary | ICD-10-CM | POA: Diagnosis not present

## 2014-10-15 DIAGNOSIS — H0231 Blepharochalasis right upper eyelid: Secondary | ICD-10-CM | POA: Diagnosis not present

## 2014-10-15 DIAGNOSIS — I10 Essential (primary) hypertension: Secondary | ICD-10-CM | POA: Diagnosis not present

## 2014-10-15 DIAGNOSIS — I4891 Unspecified atrial fibrillation: Secondary | ICD-10-CM | POA: Diagnosis not present

## 2014-10-15 DIAGNOSIS — H02831 Dermatochalasis of right upper eyelid: Secondary | ICD-10-CM | POA: Diagnosis not present

## 2014-10-15 DIAGNOSIS — H02834 Dermatochalasis of left upper eyelid: Secondary | ICD-10-CM | POA: Diagnosis not present

## 2014-10-21 DIAGNOSIS — H0236 Blepharochalasis left eye, unspecified eyelid: Secondary | ICD-10-CM | POA: Diagnosis not present

## 2014-10-21 DIAGNOSIS — I4891 Unspecified atrial fibrillation: Secondary | ICD-10-CM | POA: Diagnosis not present

## 2014-10-21 DIAGNOSIS — I10 Essential (primary) hypertension: Secondary | ICD-10-CM | POA: Diagnosis not present

## 2014-10-21 DIAGNOSIS — Z8546 Personal history of malignant neoplasm of prostate: Secondary | ICD-10-CM | POA: Diagnosis not present

## 2014-10-21 DIAGNOSIS — Z87891 Personal history of nicotine dependence: Secondary | ICD-10-CM | POA: Diagnosis not present

## 2014-10-21 DIAGNOSIS — H0233 Blepharochalasis right eye, unspecified eyelid: Secondary | ICD-10-CM | POA: Diagnosis not present

## 2014-11-10 DIAGNOSIS — L570 Actinic keratosis: Secondary | ICD-10-CM | POA: Diagnosis not present

## 2014-11-24 DIAGNOSIS — L570 Actinic keratosis: Secondary | ICD-10-CM | POA: Diagnosis not present

## 2014-11-25 DIAGNOSIS — Z48810 Encounter for surgical aftercare following surgery on the sense organs: Secondary | ICD-10-CM | POA: Diagnosis not present

## 2014-11-26 DIAGNOSIS — L821 Other seborrheic keratosis: Secondary | ICD-10-CM | POA: Diagnosis not present

## 2014-11-26 DIAGNOSIS — Z85828 Personal history of other malignant neoplasm of skin: Secondary | ICD-10-CM | POA: Diagnosis not present

## 2014-11-26 DIAGNOSIS — L570 Actinic keratosis: Secondary | ICD-10-CM | POA: Diagnosis not present

## 2014-12-22 DIAGNOSIS — L570 Actinic keratosis: Secondary | ICD-10-CM | POA: Diagnosis not present

## 2015-03-10 ENCOUNTER — Other Ambulatory Visit: Payer: Self-pay

## 2015-03-10 MED ORDER — LISINOPRIL 40 MG PO TABS: ORAL_TABLET | ORAL | Status: DC

## 2015-03-10 MED ORDER — APIXABAN 5 MG PO TABS: 5.0000 mg | ORAL_TABLET | Freq: Two times a day (BID) | ORAL | Status: DC

## 2015-04-15 DIAGNOSIS — L570 Actinic keratosis: Secondary | ICD-10-CM | POA: Diagnosis not present

## 2015-04-15 DIAGNOSIS — C44722 Squamous cell carcinoma of skin of right lower limb, including hip: Secondary | ICD-10-CM | POA: Diagnosis not present

## 2015-04-15 DIAGNOSIS — C44712 Basal cell carcinoma of skin of right lower limb, including hip: Secondary | ICD-10-CM | POA: Diagnosis not present

## 2015-04-15 DIAGNOSIS — Z85828 Personal history of other malignant neoplasm of skin: Secondary | ICD-10-CM | POA: Diagnosis not present

## 2015-04-15 DIAGNOSIS — D485 Neoplasm of uncertain behavior of skin: Secondary | ICD-10-CM | POA: Diagnosis not present

## 2015-04-16 ENCOUNTER — Telehealth: Payer: Self-pay

## 2015-04-16 NOTE — Telephone Encounter (Signed)
Call to introduce AWV and stated he would come in at the end of Sept; Apt made for 9/30 at 2:30/ Recommended he call and schedule with Dr. Linna Darner for CPE.

## 2015-05-03 NOTE — Progress Notes (Signed)
Cardiology Office Note   Date:  05/04/2015   ID:  Mike Day, Mike Day 1945/09/13, MRN [id number]  PCP:  Unice Cobble, MD  Cardiologist:  Dr. Jenkins Rouge   Electrophysiologist:  n/a  Chief Complaint  Patient presents with  . Atrial Fibrillation  . Follow-up    HTN     History of Present Illness: [name] is a 69 y.o. male with a hx of atrial flutter status post RFCA, chronic atrial fibrillation, HTN, HL. He established with Dr. Johnsie Cancel 08/2013. He was placed on Eliquis given stroke risk (CHADS2-VASc=2).  Last seen by me 03/18/14. He returns for follow-up.  Here with his wife.  Overall doing well.  Still has some fatigue but it is much better on Lopressor 12.5 mg bid.  The patient denies chest pain, shortness of breath, syncope, orthopnea, PND or significant pedal edema.  He wants to start long distance running again.  He is going to [facility] soon.   Studies/Reports Reviewed Today:  Echo 09/02/13  Severe basal septal hypertrophy, moderate LVH, EF 60-65%, moderate LAE, mild RAE    Past Medical History  Diagnosis Date  . Atrial flutter 2008    Status post RFCA Dr Cristopher Peru  . Hyperlipidemia   . Hypertension   . Chronic atrial fibrillation     Eliquis  . Colon polyps     ADENOMATOUS POLYPS (3)  . Anemia     Early 20's  . History of echocardiogram     Echo 12/14Severe basal septal hypertrophy, moderate LVH, EF 60-65%, moderate LAE, mild RAE   . Prostate cancer     Past Surgical History  Procedure Laterality Date  . 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 Mohs surgery      left hand  . Colonoscopy w/ polypectomy  2003    [facility]  . Septoplasty  Age 20  . Hernia repair  07/2008    Dr.Martin  . 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 Colonoscopy  2010  . Colonoscopy  2015     Current Outpatient Prescriptions  Medication Sig Dispense Refill  . apixaban (ELIQUIS) 5 MG TABS tablet Take 1 tablet  (5 mg total) by mouth 2 (two) times daily. 60 tablet 0  . lisinopril (PRINIVIL,ZESTRIL) 40 MG tablet Take one tablet by mouth one time daily 30 tablet 0  . metoprolol tartrate (LOPRESSOR) 25 MG tablet Take 12.5 mg by mouth 2 (two) times daily.    . simvastatin (ZOCOR) 40 MG tablet Take one tablet by mouth   nightly at bedtime 90 tablet 1  . Triamcinolone Acetonide (NASACORT AQ NA) Place 1 spray into the nose as needed.     No current facility-administered medications for this visit.    Allergies:   Levofloxacin and Tape    Social History:  The patient  reports that he quit smoking about 7 years ago. He has never used smokeless tobacco. He reports that he drinks about 4.2 oz of alcohol per week. He reports that he does not use illicit drugs.   Family History:  The patient's family history includes Diabetes in his mother; Emphysema in his father; Heart attack (age of onset: 13) in his brother; Heart disease in his father; Hypertension in his brother; Lung disease in his father; Stroke (age of onset: 70) in his mother. There is no history of Cancer.    ROS:   Please see the  history of present illness.   Review of Systems  Constitution: Positive for malaise/fatigue.  Cardiovascular: Positive for irregular heartbeat.  Hematologic/Lymphatic: Bruises/bleeds easily.  All other systems reviewed and are negative.     PHYSICAL EXAM: VS:  BP 124/64 mmHg  Pulse 59  Ht 6\' 3"  (1.905 m)  Wt 213 lb (96.616 kg)  BMI 26.62 kg/m2    Wt Readings from Last 3 Encounters:  05/04/15 213 lb (96.616 kg)  07/03/14 212 lb 4 oz (96.276 kg)  05/05/14 214 lb (97.07 kg)     GEN: Well nourished, well developed, in no acute distress HEENT: normal Neck: no JVD,   no masses Cardiac:  Normal S1/S2, irreg irreg rhythm; no murmur ,  no rubs or gallops, no edema   Respiratory:  clear to auscultation bilaterally, no wheezing, rhonchi or rales. GI: soft, nontender, nondistended, + BS MS: no deformity or  atrophy Skin: warm and dry  Neuro:  CNs II-XII intact, Strength and sensation are intact Psych: Normal affect   EKG:  EKG is ordered today.  It demonstrates:    Atrial fibrillation, HR 59   Recent Labs: No results found for requested labs within last 365 days.    Lipid Panel    Component Value Date/Time   CHOL 112 07/02/2013 1134   TRIG 75.0 07/02/2013 1134   HDL 38.30* 07/02/2013 1134   CHOLHDL 3 07/02/2013 1134   VLDL 15.0 07/02/2013 1134   LDLCALC 59 07/02/2013 1134      ASSESSMENT AND PLAN:  Chronic atrial fibrillation:  Rate is controlled.  He is still fatigued at times.  But, this is better on lower dose beta-blocker.  I suggested he stop the Metoprolol but he prefers to remain on it for now.  Options for the future include changing to Toprol-XL 12.5 QD or stopping the beta-blocker and getting a Holter to monitor his HR control.  Continue Eliquis. Obtain FU BMET, CBC.  Essential hypertension, benign:  Controlled.   CAD Screening:  He would like to resume running. He is active without chest pain.  I will arrange a plain ETT to screen for ischemia and assess functional status before giving him a Rx for exercise.      Medication Changes: Current medicines are reviewed at length with the patient today.  Concerns regarding medicines are as outlined above.  The following changes have been made:   Discontinued Medications   FLAXSEED, LINSEED, (EQL FLAXSEED OIL) 1200 MG CAPS    Take 1,200 mg by mouth daily.   METOPROLOL TARTRATE (LOPRESSOR) 25 MG TABLET    Take 1 tablet (25 mg total) by mouth 2 (two) times daily.   Modified Medications   No medications on file   New Prescriptions   No medications on file    Labs/ tests ordered today include:   Orders Placed This Encounter  Procedures  . Basic Metabolic Panel (BMET)  . CBC w/Diff  . Exercise Tolerance Test  . EKG 12-Lead     Disposition:    FU with Dr. Jenkins Rouge 1 year.    Signed, Versie Starks,  MHS 05/04/2015 5:08 PM    [facility] [address], [place], [place]  [address] Phone: [phone]; Fax: [phone]

## 2015-05-04 ENCOUNTER — Encounter: Payer: Self-pay | Admitting: Physician Assistant

## 2015-05-04 ENCOUNTER — Ambulatory Visit (INDEPENDENT_AMBULATORY_CARE_PROVIDER_SITE_OTHER): Payer: Medicare Other | Admitting: Physician Assistant

## 2015-05-04 VITALS — BP 124/64 | HR 59 | Ht 75.0 in | Wt 213.0 lb

## 2015-05-04 DIAGNOSIS — I482 Chronic atrial fibrillation, unspecified: Secondary | ICD-10-CM

## 2015-05-04 DIAGNOSIS — I10 Essential (primary) hypertension: Secondary | ICD-10-CM

## 2015-05-04 NOTE — Patient Instructions (Addendum)
If you want to change your Metoprolol (Lopressor), give [place] a call. We have several options: 1.  You can hold the morning dose of Lopressor on days you feel more fatigued. 2.  We can change Lopressor to Toprol-XL 12.5 mg daily to give you a little less than what you are taking now. 3.  We could stop Lopressor altogether and get a Holter monitor to make sure your heart rate is ok.   Medication Instructions:  REFILLS FOR ELIQUIS, LISINOPRIL, SIMVASTATIN HAVE BEEN SENT IN  Labwork: TODAY BMET, CBC W/DIFF  Testing/Procedures: Your physician has requested that you have an exercise tolerance test. For further information please visit [email or web address]. Please also follow instruction sheet, as given.   Follow-Up: Your physician wants you to follow-up in: [place] Bl[place] Singer will receive a reminder letter in the mail two months in advance. If you don't receive a letter, please call our office to schedule the follow-up appointment.   Any Other Special Instructions Will Be Listed Below (If Applicable).

## 2015-05-05 ENCOUNTER — Telehealth: Payer: Self-pay | Admitting: *Deleted

## 2015-05-05 LAB — BASIC METABOLIC PANEL
BUN: 11 mg/dL (ref 6–23)
CALCIUM: 9.6 mg/dL (ref 8.4–10.5)
CO2: 29 mEq/L (ref 19–32)
Chloride: 99 mEq/L (ref 96–112)
Creatinine, Ser: 0.93 mg/dL (ref 0.40–1.50)
GFR: 85.61 mL/min (ref >60.00)
Glucose, Bld: 104 mg/dL — ABNORMAL HIGH (ref 70–99)
POTASSIUM: 4.6 meq/L (ref 3.5–5.1)
SODIUM: 135 meq/L (ref 135–145)

## 2015-05-05 LAB — CBC WITH DIFFERENTIAL/PLATELET
BASOS ABS: 0 10*3/uL (ref 0.0–0.1)
Basophils Relative: 0.5 % (ref 0.0–3.0)
Eosinophils Absolute: 0.2 10*3/uL (ref 0.0–0.7)
Eosinophils Relative: 3.3 % (ref 0.0–5.0)
HEMATOCRIT: 43.4 % (ref 39.0–52.0)
HEMOGLOBIN: 14.8 g/dL (ref 13.0–17.0)
Lymphocytes Relative: 18 % (ref 12.0–46.0)
Lymphs Abs: 1.2 10*3/uL (ref 0.7–4.0)
MCHC: 34.1 g/dL (ref 30.0–36.0)
MCV: 97.5 fl (ref 78.0–100.0)
MONOS PCT: 5.6 % (ref 3.0–12.0)
Monocytes Absolute: 0.4 10*3/uL (ref 0.1–1.0)
Neutro Abs: 5 10*3/uL (ref 1.4–7.7)
Neutrophils Relative %: 72.6 % (ref 43.0–77.0)
Platelets: 172 10*3/uL (ref 150.0–400.0)
RBC: 4.45 Mil/uL (ref 4.22–5.81)
RDW: 13.4 % (ref 11.5–15.5)
WBC: 6.8 10*3/uL (ref 4.0–10.5)

## 2015-05-05 NOTE — Telephone Encounter (Signed)
Pt notified of lab results by phone with verabl understanding.

## 2015-05-08 ENCOUNTER — Encounter: Payer: Self-pay | Admitting: Physician Assistant

## 2015-05-08 ENCOUNTER — Other Ambulatory Visit: Payer: Self-pay | Admitting: *Deleted

## 2015-05-08 MED ORDER — LISINOPRIL 40 MG PO TABS: ORAL_TABLET | ORAL | Status: DC

## 2015-05-08 MED ORDER — APIXABAN 5 MG PO TABS: 5.0000 mg | ORAL_TABLET | Freq: Two times a day (BID) | ORAL | Status: DC

## 2015-05-08 MED ORDER — SIMVASTATIN 40 MG PO TABS: ORAL_TABLET | ORAL | Status: DC

## 2015-05-08 MED ORDER — METOPROLOL TARTRATE 25 MG PO TABS: 12.5000 mg | ORAL_TABLET | Freq: Two times a day (BID) | ORAL | Status: DC

## 2015-05-08 NOTE — Telephone Encounter (Signed)
Patient called and was a little upset that his refills were not sent in at his recent office visit with [name] as he was told that they would be. I apologized about this and let him know that I would send them in today.

## 2015-05-14 ENCOUNTER — Ambulatory Visit (INDEPENDENT_AMBULATORY_CARE_PROVIDER_SITE_OTHER): Payer: Medicare Other

## 2015-05-14 DIAGNOSIS — Z23 Encounter for immunization: Secondary | ICD-10-CM | POA: Diagnosis not present

## 2015-06-05 ENCOUNTER — Ambulatory Visit (INDEPENDENT_AMBULATORY_CARE_PROVIDER_SITE_OTHER): Payer: Medicare Other

## 2015-06-05 VITALS — BP 140/100 | HR 63 | Ht 75.0 in | Wt 213.0 lb

## 2015-06-05 DIAGNOSIS — Z0000 Encounter for general adult medical examination without abnormal findings: Secondary | ICD-10-CM | POA: Diagnosis not present

## 2015-06-05 NOTE — Patient Instructions (Addendum)
[name] , Thank you for taking time to come for your Medicare Wellness Visit. I appreciate your ongoing commitment to your health goals. Please review the following plan we discussed and let me know if I can assist you in the future.   Will make apt with Dr. Linna Darner and evaluate labs and overall health and mood.  These are the goals we discussed: Goals    . Exercise 150 minutes per week (moderate activity)     Goal to marathon;        This is a list of the screening recommended for you and due dates:  Health Maintenance  Topic Date Due  .  Hepatitis C: One time screening is recommended by Center for Disease Control  (CDC) for  adults born from [birth year] through 1965.   06-04-1946  . Flu Shot  04/05/2016  . Colon Cancer Screening  05/06/2019  . Tetanus Vaccine  12/05/2020  . Shingles Vaccine  Completed  . Pneumonia vaccines  Completed     Health Maintenance A healthy lifestyle and preventative care can promote health and wellness.  Maintain regular health, dental, and eye exams.  Eat a healthy diet. Foods like vegetables, fruits, whole grains, low-fat dairy products, and lean protein foods contain the nutrients you need and are low in calories. Decrease your intake of foods high in solid fats, added sugars, and salt. Get information about a proper diet from your health care provider, if necessary.  Regular physical exercise is one of the most important things you can do for your health. Most adults should get at least 150 minutes of moderate-intensity exercise (any activity that increases your heart rate and causes you to sweat) each week. In addition, most adults need muscle-strengthening exercises on 2 or more days a week.   Maintain a healthy weight. The body mass index (BMI) is a screening tool to identify possible weight problems. It provides an estimate of body fat based on height and weight. Your health care provider can find your BMI and can help you achieve or maintain a  healthy weight. For males 20 years and older:  A BMI below 18.5 is considered underweight.  A BMI of 18.5 to 24.9 is normal.  A BMI of 25 to 29.9 is considered overweight.  A BMI of 30 and above is considered obese.  Maintain normal blood lipids and cholesterol by exercising and minimizing your intake of saturated fat. Eat a balanced diet with plenty of fruits and vegetables. Blood tests for lipids and cholesterol should begin at age 52 and be repeated every 5 years. If your lipid or cholesterol levels are high, you are over age 5, or you are at high risk for heart disease, you may need your cholesterol levels checked more frequently.Ongoing high lipid and cholesterol levels should be treated with medicines if diet and exercise are not working.  If you smoke, find out from your health care provider how to quit. If you do not use tobacco, do not start.  Lung cancer screening is recommended for adults aged 99-80 years who are at high risk for developing lung cancer because of a history of smoking. A yearly low-dose CT scan of the lungs is recommended for people who have at least a 30-pack-year history of smoking and are current smokers or have quit within the past 15 years. A pack year of smoking is smoking an average of 1 pack of cigarettes a day for 1 year (for example, a 30-pack-year history of smoking could  mean smoking 1 pack a day for 30 years or 2 packs a day for 15 years). Yearly screening should continue until the smoker has stopped smoking for at least 15 years. Yearly screening should be stopped for people who develop a health problem that would prevent them from having lung cancer treatment.  If you choose to drink alcohol, do not have more than 2 drinks per day. One drink is considered to be 12 oz (360 mL) of beer, 5 oz (150 mL) of wine, or 1.5 oz (45 mL) of liquor.  Avoid the use of street drugs. Do not share needles with anyone. Ask for help if you need support or instructions about  stopping the use of drugs.  High blood pressure causes heart disease and increases the risk of stroke. Blood pressure should be checked at least every 1-2 years. Ongoing high blood pressure should be treated with medicines if weight loss and exercise are not effective.  If you are 34-63 years old, ask your health care provider if you should take aspirin to prevent heart disease.  Diabetes screening involves taking a blood sample to check your fasting blood sugar level. This should be done once every 3 years after age 63 if you are at a normal weight and without risk factors for diabetes. Testing should be considered at a younger age or be carried out more frequently if you are overweight and have at least 1 risk factor for diabetes.  Colorectal cancer can be detected and often prevented. Most routine colorectal cancer screening begins at the age of 44 and continues through age 75. However, your health care provider may recommend screening at an earlier age if you have risk factors for colon cancer. On a yearly basis, your health care provider may provide home test kits to check for hidden blood in the stool. A small camera at the end of a tube may be used to directly examine the colon (sigmoidoscopy or colonoscopy) to detect the earliest forms of colorectal cancer. Talk to your health care provider about this at age 41 when routine screening begins. A direct exam of the colon should be repeated every 5-10 years through age 86, unless early forms of precancerous polyps or small growths are found.  People who are at an increased risk for hepatitis B should be screened for this virus. You are considered at high risk for hepatitis B if:  You were born in a country where hepatitis B occurs often. Talk with your health care provider about which countries are considered high risk.  Your parents were born in a high-risk country and you have not received a shot to protect against hepatitis B (hepatitis B  vaccine).  You have HIV or AIDS.  You use needles to inject street drugs.  You live with, or have sex with, someone who has hepatitis B.  You are a man who has sex with other men (MSM).  You get hemodialysis treatment.  You take certain medicines for conditions like cancer, organ transplantation, and autoimmune conditions.  Hepatitis C blood testing is recommended for all people born from [birth year] through 1965 and any individual with known risk factors for hepatitis C.  Healthy men should no longer receive prostate-specific antigen (PSA) blood tests as part of routine cancer screening. Talk to your health care provider about prostate cancer screening.  Testicular cancer screening is not recommended for adolescents or adult males who have no symptoms. Screening includes self-exam, a health care provider exam, and other screening  tests. Consult with your health care provider about any symptoms you have or any concerns you have about testicular cancer.  Practice safe sex. Use condoms and avoid high-risk sexual practices to reduce the spread of sexually transmitted infections (STIs).  You should be screened for STIs, including gonorrhea and chlamydia if:  You are sexually active and are younger than 24 years.  You are older than 24 years, and your health care provider tells you that you are at risk for this type of infection.  Your sexual activity has changed since you were last screened, and you are at an increased risk for chlamydia or gonorrhea. Ask your health care provider if you are at risk.  If you are at risk of being infected with HIV, it is recommended that you take a prescription medicine daily to prevent HIV infection. This is called pre-exposure prophylaxis (PrEP). You are considered at risk if:  You are a man who has sex with other men (MSM).  You are a heterosexual man who is sexually active with multiple partners.  You take drugs by injection.  You are sexually active  with a partner who has HIV.  Talk with your health care provider about whether you are at high risk of being infected with HIV. If you choose to begin PrEP, you should first be tested for HIV. You should then be tested every 3 months for as long as you are taking PrEP.  Use sunscreen. Apply sunscreen liberally and repeatedly throughout the day. You should seek shade when your shadow is shorter than you. Protect yourself by wearing long sleeves, pants, a wide-brimmed hat, and sunglasses year round whenever you are outdoors.  Tell your health care provider of new moles or changes in moles, especially if there is a change in shape or color. Also, tell your health care provider if a mole is larger than the size of a pencil eraser.  A one-time screening for abdominal aortic aneurysm (AAA) and surgical repair of large AAAs by ultrasound is recommended for men aged 64-75 years who are current or former smokers.  Stay current with your vaccines (immunizations). Document Released: 02/18/2008 Document Revised: 08/27/2013 Document Reviewed: 01/17/2011 [facility] Patient Information 2015 [place], [place]. This information is not intended to replace advice given to you by your health care provider. Make sure you discuss any questions you have with your health care provider.

## 2015-06-05 NOTE — Progress Notes (Signed)
Medical screening examination/treatment/procedure(s) were performed by non-physician practitioner and as supervising physician I was immediately available for consultation/collaboration. I agree with above. Unice Cobble, MD  Affect issues will be explored @ follow up visit.

## 2015-06-05 NOTE — Progress Notes (Signed)
Subjective:   Mike Day is a 69 y.o. male who presents for Medicare Annual (Subsequent) preventive examination.  Review of Systems:  HRA assessment completed during visit;  Patient is here for Annual Wellness Assessment The Patient was informed that this wellness visit is to identify risk and educate on how to reduce risk for increase disease through lifestyle changes.   ROS was deferred to medical exam;   Medical review; Hyperlipidemia; HTN;  Needs apt with [facility]/ no fup regarding labs seen;  A1c was 7  07/02/2013/ last lipid 2014 HDL 38; LDL 59; trig 75 ratio 3/   BMI: 26.6 Diet; eat 3 times per day; soup at lunch; wife cooks  Exercise; long distance running; plans to have a stress test prior to training; Hopes to run more marathons, which are not difficult for him but has not run in awhile.  Fall assessment; no falls SAFETY/2 levels; very mobile  Safety reviewed for the home; no issues ambulating; bathroom safety with walk in shower;  community safety; smoke detectors; carbon monoxide detectors;  and firearms safety  Sun protection; visits  Dermatologist regularly  Stressors; States he retired x 69 yo; Admits verbally to depression but denies SI; states he would never harm himself. Denies depression today; Does not feel counseling would be helpful but did spent time discussing his family or origin.  Retired x 5 years ago;   Medication review/    Mobilization and Functional losses in the last year.   Urinary or fecal incontinence reviewed; States he had very good doctors treating his prostate cancer; He has management urinary leakage.      Counseling: Hep c reviewed and will draw with other blood work after seeing Dr. Linna Darner next week Colonoscopy; 05/05/2014 q 5 years/ high fiber diet EKG 04/2015  Hearing: good  Ophthalmology exam; Eyes exam Dental; regularly  Immunizations all up to date  Cardiac Risk Factors include: advanced age (>29men, >47  women);dyslipidemia;hypertension;male gender     Objective:     Vitals: BP 140/100 mmHg  Pulse 63  Ht 6\' 3"  (1.905 m)  Wt 213 lb (96.616 kg)  BMI 26.62 kg/m2  SpO2 97%  Tobacco History  Smoking status  . Former Smoker  . Quit date: 09/06/2007  Smokeless tobacco  . Never Used    Comment: smoked 1963 -2009; total 5 packs of cigarettes in life . Cigars < 1 / day on average 1992-2009     Counseling given: Yes   Past Medical History  Diagnosis Date  . Atrial flutter 2008    Status post [facility] Dr Cristopher Peru  . Hyperlipidemia   . Hypertension   . Chronic atrial fibrillation     Eliquis  . Colon polyps     ADENOMATOUS POLYPS (3)  . Anemia     Early 20's  . History of echocardiogram     Echo 12/14Severe basal septal hypertrophy, moderate LVH, EF 60-65%, moderate LAE, mild RAE   . Prostate cancer    Past Surgical History  Procedure Laterality Date  . 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 Mohs surgery      left hand  . Colonoscopy w/ polypectomy  2003    [facility]  . Septoplasty  Age 88  . Hernia repair  07/2008    Dr.Martin  . 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 Colonoscopy  2010  . Colonoscopy  2015   Family History  Problem Relation Age of Onset  . Lung disease Father     Black Lung  . Heart disease Father     Congenital  . Diabetes Mother   . Stroke Mother 2  . Hypertension Brother   . Heart attack Brother 43    sudden death  . Cancer Neg Hx   . Emphysema Father    History  Sexual Activity  . Sexual Activity: Not on file    Outpatient Encounter Prescriptions as of 06/05/2015  Medication Sig  . apixaban (ELIQUIS) 5 MG TABS tablet Take 1 tablet (5 mg total) by mouth 2 (two) times daily.  [name] lisinopril (PRINIVIL,ZESTRIL) 40 MG tablet Take one tablet by mouth one time daily  . metoprolol tartrate (LOPRESSOR) 25 MG tablet Take 0.5 tablets (12.5 mg total) by mouth 2 (two) times daily.  .  simvastatin (ZOCOR) 40 MG tablet Take one tablet by mouth   nightly at bedtime  . Triamcinolone Acetonide (NASACORT AQ NA) Place 1 spray into the nose as needed.   No facility-administered encounter medications on file as of 06/05/2015.    Activities of Daily Living In your present state of health, do you have any difficulty performing the following activities: 06/05/2015  Hearing? N  Vision? N  Difficulty concentrating or making decisions? N  Walking or climbing stairs? N  Dressing or bathing? N  Doing errands, shopping? N  Preparing Food and eating ? N  Using the Toilet? N  In the past six months, have you accidently leaked urine? Y  Do you have problems with loss of bowel control? N  Managing your Medications? N  Managing your Finances? N  Housekeeping or managing your Housekeeping? N    Patient Care Team: Hendricks Limes, MD as PCP - General    Assessment:    Assessment   Today patient counseled on age appropriate routine health concerns for screening and prevention, each reviewed. Immunizations reviewed and up to date.  Labs deferred for CPE or medical fup by MD, but results from 2014 were disucssed. The patient agreed to fup with  Dr. Linna Darner to discuss his A1c and other issues addressed at today's assessment.      Depression Risk factors for depression reviewed. Patient appropriate in dress;  sometimes inappropriate with his responses. Talked about his family of origin and hx of prostatectomy as well as resulting impotence; Verbalized this was difficult and had thought about surgery and then would "trivialize" his statement and respond with inappropriate verbal dialogue that would seem out of character. Made a statement that he was a "perpetual liar" and "can make anyone believe anything." Then would seem to get back on track but made jokes throughout the conversation today. Completed the assessment; affect did not appear depressed but can't rule this out; Conversational  responses seem disinhibited at times. Denied overt depression when asked directly and denied thoughts of suicide when asked directly. Stated he felt running again would help mood.  Gave permission to discuss his mood with Dr. Linna Darner as he stated he had  not told anyone he was depressed at times. Educated regarding assistance for depression as it can be due to bio-chemical change and can be assisted through therapy and medication if he so chooses.    Hearing function is  Good (4000hz  both ears) and visual acuity good.  ADLs screened and addressed as needed. Functional ability and level of safety reviewed and appropriate. Educated on memory loss and AD8 score 0 or the patient did not  discuss issues.  Education, counseling and referrals performed based on assessed risks today. Patient provided with a copy of personalized plan for preventive services and due dates   HEPATITIS SCREEN reviewed; no risk identified/ had A and B vaccines and did agreed to Hep C antibody check with other labs.   TOBACCO/ETOH or other DRUG use was negative   Exercise Activities and Dietary recommendations Current Exercise Habits:: Home exercise routine, Type of exercise: walking, Time (Minutes): > 60, Frequency (Times/Week): 3, Weekly Exercise (Minutes/Week): 0  Goals    . Exercise 150 minutes per week (moderate activity)     Goal to marathon;       Fall Risk Fall Risk  06/05/2015 07/02/2013  Falls in the past year? Yes No  Injury with Fall? No -  Risk for fall due to : History of fall(s) -   Depression Screen PHQ 2/9 Scores 06/05/2015 07/02/2013  PHQ - 2 Score 0 0    Denied depression but defer further assessment of mental status and overall emotional health to Dr. Linna Darner.   Cognitive Testing No flowsheet data found.  Immunization History  Administered Date(s) Administered  . Hep A / Hep B 08/04/2009  . Influenza Split 06/15/2011, 06/05/2012  . Influenza Whole 07/10/2007, 06/12/2008, 06/05/2009,  05/19/2010  . Influenza,inj,Quad PF,36+ Mos 05/29/2013, 05/06/2014, 05/14/2015  . Pneumococcal Conjugate-13 07/03/2014  . Pneumococcal Polysaccharide-23 06/08/2012  . Td 09/06/1999  . Tdap 12/06/2010  . Zoster 09/07/2009   Screening Tests Health Maintenance  Topic Date Due  . Hepatitis C Screening  [birth year]  . INFLUENZA VACCINE  04/05/2016  . COLONOSCOPY  05/06/2019  . TETANUS/TDAP  12/05/2020  . ZOSTAVAX  Completed  . PNA vac Low Risk Adult  Completed      Plan:    Apt with Dr. Linna Darner made for review of labs and depression  During the course of the visit the patient was educated and counseled about the following appropriate screening and preventive services:   Vaccines to include Pneumoccal, Influenza, Hepatitis B, Td, Zostavax, HCV/ all up to date  Electrocardiogram 04/2015  Cardiovascular Disease / defer to cardiology  Colorectal cancer screening 05/05/2014 q 5 years/ high fiber diet  Diabetes screening/ reviewed last A1c and agrees he needs to fup on this.  Glaucoma screening/had eye exam  Nutrition assessed as adequate  Patient Instructions (the written plan) was given to the patient.   Wynetta Fines, RN  06/05/2015

## 2015-06-08 ENCOUNTER — Encounter: Payer: Medicare Other | Admitting: Physician Assistant

## 2015-06-08 ENCOUNTER — Other Ambulatory Visit: Payer: Self-pay | Admitting: Physician Assistant

## 2015-06-08 ENCOUNTER — Ambulatory Visit (INDEPENDENT_AMBULATORY_CARE_PROVIDER_SITE_OTHER): Payer: Medicare Other

## 2015-06-08 DIAGNOSIS — I482 Chronic atrial fibrillation, unspecified: Secondary | ICD-10-CM

## 2015-06-08 DIAGNOSIS — I10 Essential (primary) hypertension: Secondary | ICD-10-CM

## 2015-06-08 DIAGNOSIS — E785 Hyperlipidemia, unspecified: Secondary | ICD-10-CM

## 2015-06-08 LAB — EXERCISE TOLERANCE TEST
CHL CUP RESTING HR STRESS: 63 {beats}/min
CHL RATE OF PERCEIVED EXERTION: 11
CSEPED: 6 min
Estimated workload: 7 METS
MPHR: 151 {beats}/min
Peak HR: 148 {beats}/min
Percent HR: 98 %

## 2015-06-08 MED ORDER — SIMVASTATIN 40 MG PO TABS: 20.0000 mg | ORAL_TABLET | Freq: Every day | ORAL | Status: DC

## 2015-06-08 MED ORDER — AMLODIPINE BESYLATE 5 MG PO TABS: 5.0000 mg | ORAL_TABLET | Freq: Every day | ORAL | Status: DC

## 2015-06-08 NOTE — Progress Notes (Unsigned)
Patient seen for ETT today. BP uncontrolled (up to 220/112). Will add Amlodipine 5 mg QD. Patient will call with BP readings in 2 weeks. [name], PA-C   06/08/2015 11:49 AM

## 2015-06-08 NOTE — Patient Instructions (Signed)
Medication Instructions:  Start Amlodipine 5 mg daily for blood pressure. Decrease Simvastatin to 20 mg at bedtime. You can take 1/2 tablet of your Simvastatin 40 mg to equal 20 mg.  Labwork: None   Testing/Procedures: None   Follow-Up: Dr. Jenkins Rouge as planned.  Any Other Special Instructions Will Be Listed Below (If Applicable).  Hold off on running for now. Call me [name], PA-C) in 2 weeks with BP readings.

## 2015-06-09 ENCOUNTER — Encounter: Payer: Self-pay | Admitting: Physician Assistant

## 2015-06-09 ENCOUNTER — Encounter: Payer: Self-pay | Admitting: Internal Medicine

## 2015-06-10 ENCOUNTER — Encounter: Payer: Medicare Other | Admitting: Internal Medicine

## 2015-06-18 DIAGNOSIS — Z8546 Personal history of malignant neoplasm of prostate: Secondary | ICD-10-CM | POA: Diagnosis not present

## 2015-06-18 DIAGNOSIS — I10 Essential (primary) hypertension: Secondary | ICD-10-CM | POA: Diagnosis not present

## 2015-06-18 DIAGNOSIS — Z87891 Personal history of nicotine dependence: Secondary | ICD-10-CM | POA: Diagnosis not present

## 2015-06-18 DIAGNOSIS — C61 Malignant neoplasm of prostate: Secondary | ICD-10-CM | POA: Diagnosis not present

## 2015-06-18 DIAGNOSIS — Z9079 Acquired absence of other genital organ(s): Secondary | ICD-10-CM | POA: Diagnosis not present

## 2015-06-18 DIAGNOSIS — Z79899 Other long term (current) drug therapy: Secondary | ICD-10-CM | POA: Diagnosis not present

## 2015-06-18 DIAGNOSIS — Z881 Allergy status to other antibiotic agents status: Secondary | ICD-10-CM | POA: Diagnosis not present

## 2015-06-23 ENCOUNTER — Encounter: Payer: Self-pay | Admitting: Physician Assistant

## 2015-06-24 ENCOUNTER — Encounter: Payer: Self-pay | Admitting: Physician Assistant

## 2015-06-24 ENCOUNTER — Telehealth: Payer: Self-pay | Admitting: Physician Assistant

## 2015-06-24 NOTE — Telephone Encounter (Signed)
New message     FYI Pt will mail you his bp and HR readings from 06-08-15 thru 06-23-15.  Too many to give over the phone

## 2015-07-06 ENCOUNTER — Other Ambulatory Visit: Payer: Self-pay | Admitting: Physician Assistant

## 2015-07-06 DIAGNOSIS — I10 Essential (primary) hypertension: Secondary | ICD-10-CM

## 2015-07-06 MED ORDER — AMLODIPINE BESYLATE 5 MG PO TABS: 5.0000 mg | ORAL_TABLET | Freq: Every day | ORAL | Status: DC

## 2015-07-07 ENCOUNTER — Encounter: Payer: Self-pay | Admitting: Internal Medicine

## 2015-07-07 ENCOUNTER — Ambulatory Visit (INDEPENDENT_AMBULATORY_CARE_PROVIDER_SITE_OTHER): Payer: Medicare Other | Admitting: Internal Medicine

## 2015-07-07 ENCOUNTER — Other Ambulatory Visit (INDEPENDENT_AMBULATORY_CARE_PROVIDER_SITE_OTHER): Payer: Medicare Other

## 2015-07-07 VITALS — BP 138/84 | HR 56 | Temp 97.6°F | Ht 75.0 in | Wt 215.8 lb

## 2015-07-07 DIAGNOSIS — Z23 Encounter for immunization: Secondary | ICD-10-CM | POA: Diagnosis not present

## 2015-07-07 DIAGNOSIS — I10 Essential (primary) hypertension: Secondary | ICD-10-CM

## 2015-07-07 DIAGNOSIS — E785 Hyperlipidemia, unspecified: Secondary | ICD-10-CM

## 2015-07-07 DIAGNOSIS — R739 Hyperglycemia, unspecified: Secondary | ICD-10-CM | POA: Diagnosis not present

## 2015-07-07 DIAGNOSIS — D126 Benign neoplasm of colon, unspecified: Secondary | ICD-10-CM

## 2015-07-07 LAB — HEMOGLOBIN A1C: HEMOGLOBIN A1C: 6.7 % — AB (ref 4.6–6.5)

## 2015-07-07 LAB — LIPID PANEL
Cholesterol: 131 mg/dL (ref 0–200)
HDL: 41.2 mg/dL (ref >39.00)
LDL CALC: 72 mg/dL (ref 0–99)
NonHDL: 89.64
Total CHOL/HDL Ratio: 3
Triglycerides: 88 mg/dL (ref 0.0–149.0)
VLDL: 17.6 mg/dL (ref 0.0–40.0)

## 2015-07-07 LAB — HEPATIC FUNCTION PANEL
ALBUMIN: 4.2 g/dL (ref 3.5–5.2)
ALK PHOS: 58 U/L (ref 39–117)
ALT: 19 U/L (ref 0–53)
AST: 21 U/L (ref 0–37)
BILIRUBIN DIRECT: 0.2 mg/dL (ref 0.0–0.3)
Total Bilirubin: 1 mg/dL (ref 0.2–1.2)
Total Protein: 7.5 g/dL (ref 6.0–8.3)

## 2015-07-07 LAB — TSH: TSH: 0.94 u[IU]/mL (ref 0.35–4.50)

## 2015-07-07 NOTE — Assessment & Plan Note (Signed)
Ac

## 2015-07-07 NOTE — Assessment & Plan Note (Signed)
CBC and differential was normal in August of this year.

## 2015-07-07 NOTE — Assessment & Plan Note (Signed)
Blood pressure goals reviewed. BMET reviewed 

## 2015-07-07 NOTE — Assessment & Plan Note (Signed)
Lipids, LFTs, TSH  

## 2015-07-07 NOTE — Patient Instructions (Signed)
Minimal Blood Pressure Goal= AVERAGE < 140/90;  Ideal is an AVERAGE < 135/85. This AVERAGE should be calculated from @ least 5-7 BP readings taken @ different times of day on different days of week. You should not respond to isolated BP readings , but rather the AVERAGE for that week .Please bring your  blood pressure cuff to office visits to verify that it is reliable.It  can also be checked against the blood pressure device at the pharmacy. Finger or wrist cuffs are not dependable; an arm cuff is.   Your next office appointment will be determined based upon review of your pending labs  and  xrays  Those written interpretation of the lab results and instructions will be transmitted to you by My Chart   Critical results will be called.   Followup as needed for any active or acute issue. Please report any significant change in your symptoms. 

## 2015-07-07 NOTE — Progress Notes (Signed)
Pre visit review using our clinic review tool, if applicable. No additional management support is needed unless otherwise documented below in the visit note. 

## 2015-07-07 NOTE — Addendum Note (Signed)
Addended by: Lowella Dandy on: 07/07/2015 11:47 AM   Modules accepted: Orders

## 2015-07-07 NOTE — Progress Notes (Signed)
   Subjective:    Patient ID: Mike Day, male    DOB: [birth year], 68 y.o.   MRN: [id number]  HPI The patient is here to assess status of active health conditions.  PMH, FH, & Social History reviewed & updated.No change in [place] as recorded.  He has been compliant with his medications without adverse effects. He is on a heart healthy diet. He walks 10,000 steps 3-4 times per week without symptoms. He does have some calf discomfort occasionally after exercise but not during exercise. He did have a heart rate over 200 at 5 minutes of being on the treadmill for stress test. Amlodipine was added to his blood pressure regimen. Blood pressure ranges 125-135 over 80s.  He's had occasional nosebleed without other bleeding dyscrasias. He does have nocturia 1-2 times per night.  Colonoscopy was performed last year and revealed tubular, fragments.  He has no active GI symptoms.  He saw his Urologist last month. PSA was nondetectable. Labs were done 05/04/15; glucose was 104. Renal function and CBC and differential are normal.   Review of Systems  Chest pain, palpitations, tachycardia, exertional dyspnea, paroxysmal nocturnal dyspnea, claudication or edema are absent. No unexplained weight loss, abdominal pain, significant dyspepsia, dysphagia, melena, rectal bleeding, or persistently small caliber stools. Dysuria, pyuria, hematuria, frequency, nocturia or polyuria are denied. Change in hair, skin, nails denied. No bowel changes of constipation or diarrhea. No intolerance to heat or cold.     Objective:   Physical Exam  Pertinent or positive findings include: Pattern alopecia is present. Dorsalis pedis pulses are slightly decreased. He has slight crepitus of the knees. There are solar and keratotic skin lesions over the forearms.  General appearance :adequately nourished; in no distress.  Eyes: No conjunctival inflammation or scleral icterus is present.  Oral exam:  Lips and gums are healthy  appearing.There is no oropharyngeal erythema or exudate noted. Dental hygiene is good.  Heart:  Normal rate and regular rhythm. S1 and S2 normal without gallop, murmur, click, rub or other extra sounds    Lungs:Chest clear to auscultation; no wheezes, rhonchi,rales ,or rubs present.No increased work of breathing.   Abdomen: bowel sounds normal, soft and non-tender without masses, organomegaly or hernias noted.  No guarding or rebound. No flank tenderness to percussion.  Vascular : all pulses equal ; no bruits present.  Skin:Warm & dry.  Intact without suspicious lesions or rashes ; no tenting or jaundice   Lymphatic: No lymphadenopathy is noted about the head, neck, axilla.   Neuro: Strength, tone & DTRs normal.     Assessment & Plan:  See Current Assessment & Plan in Problem List under specific Diagnosis

## 2015-07-09 ENCOUNTER — Encounter: Payer: Self-pay | Admitting: Internal Medicine

## 2015-07-16 ENCOUNTER — Other Ambulatory Visit: Payer: Self-pay | Admitting: *Deleted

## 2015-07-16 ENCOUNTER — Encounter: Payer: Self-pay | Admitting: *Deleted

## 2015-07-16 DIAGNOSIS — E785 Hyperlipidemia, unspecified: Secondary | ICD-10-CM

## 2015-07-16 MED ORDER — SIMVASTATIN 40 MG PO TABS: 20.0000 mg | ORAL_TABLET | Freq: Every day | ORAL | Status: DC

## 2015-07-20 ENCOUNTER — Other Ambulatory Visit: Payer: Self-pay | Admitting: Cardiovascular Disease

## 2015-07-22 ENCOUNTER — Encounter: Payer: Self-pay | Admitting: Physician Assistant

## 2015-07-22 DIAGNOSIS — L570 Actinic keratosis: Secondary | ICD-10-CM | POA: Diagnosis not present

## 2015-07-22 DIAGNOSIS — Z85828 Personal history of other malignant neoplasm of skin: Secondary | ICD-10-CM | POA: Diagnosis not present

## 2015-07-23 ENCOUNTER — Encounter: Payer: Self-pay | Admitting: Physician Assistant

## 2015-07-23 ENCOUNTER — Other Ambulatory Visit: Payer: Self-pay | Admitting: *Deleted

## 2015-07-23 ENCOUNTER — Telehealth: Payer: Self-pay | Admitting: Cardiovascular Disease

## 2015-07-23 DIAGNOSIS — E785 Hyperlipidemia, unspecified: Secondary | ICD-10-CM

## 2015-07-23 MED ORDER — SIMVASTATIN 40 MG PO TABS: 20.0000 mg | ORAL_TABLET | Freq: Every day | ORAL | Status: DC

## 2015-07-23 NOTE — Telephone Encounter (Signed)
Message sent through My CHART as well

## 2015-07-23 NOTE — Telephone Encounter (Signed)
Pt calling for a refill on Simvastatin 40 mg tablet, but it was refill with 2 sig. Pt wants it sent to [facility]. Please advise

## 2015-07-24 ENCOUNTER — Other Ambulatory Visit: Payer: Self-pay

## 2015-08-11 ENCOUNTER — Other Ambulatory Visit: Payer: Self-pay

## 2015-08-11 DIAGNOSIS — I10 Essential (primary) hypertension: Secondary | ICD-10-CM

## 2015-08-11 MED ORDER — APIXABAN 5 MG PO TABS: 5.0000 mg | ORAL_TABLET | Freq: Two times a day (BID) | ORAL | Status: DC

## 2015-08-11 MED ORDER — AMLODIPINE BESYLATE 5 MG PO TABS: 5.0000 mg | ORAL_TABLET | Freq: Every day | ORAL | Status: DC

## 2015-08-11 MED ORDER — LISINOPRIL 40 MG PO TABS: ORAL_TABLET | ORAL | Status: DC

## 2015-08-17 ENCOUNTER — Telehealth: Payer: Self-pay | Admitting: Cardiovascular Disease

## 2015-08-17 NOTE — Telephone Encounter (Signed)
New message       *STAT* If patient is at the pharmacy, call can be transferred to refill team.   1. Which medications need to be refilled? (please list name of each medication and dose if known)  Lisinopril 40mg , eliquis 5mg , amlodipine 5mg   2. Which pharmacy/location (including street and city if local pharmacy) is medication to be sent to? [facility]  3. Do they need a 30 day or 90 day supply? Not sure which one

## 2015-08-17 NOTE — Telephone Encounter (Signed)
Spoke with patient and he just [facility]. He will call the office when he needs them sent to [facility].

## 2015-09-17 ENCOUNTER — Telehealth: Payer: Self-pay | Admitting: Cardiovascular Disease

## 2015-09-17 ENCOUNTER — Encounter: Payer: Self-pay | Admitting: Family Medicine

## 2015-09-17 NOTE — Telephone Encounter (Signed)
New message      Pt is on eliquis.  He fell 2wks ago and hurt his leg.  There is a big hematoma on his leg, it is swollen, red and warm to touch.  Please advise

## 2015-09-17 NOTE — Telephone Encounter (Signed)
Called patient about his message below. Encouraged patient to go to urgent care to be evaluated as soon as possible. Patient verbalized understanding.

## 2015-09-18 ENCOUNTER — Encounter: Payer: Self-pay | Admitting: Family Medicine

## 2015-09-18 ENCOUNTER — Ambulatory Visit (INDEPENDENT_AMBULATORY_CARE_PROVIDER_SITE_OTHER): Payer: Medicare Other | Admitting: Family Medicine

## 2015-09-18 ENCOUNTER — Telehealth: Payer: Self-pay | Admitting: Family Medicine

## 2015-09-18 VITALS — BP 147/89 | HR 60 | Temp 97.8°F | Resp 16 | Ht 74.0 in | Wt 214.5 lb

## 2015-09-18 DIAGNOSIS — Z7901 Long term (current) use of anticoagulants: Secondary | ICD-10-CM | POA: Diagnosis not present

## 2015-09-18 DIAGNOSIS — L03116 Cellulitis of left lower limb: Secondary | ICD-10-CM

## 2015-09-18 DIAGNOSIS — S8012XA Contusion of left lower leg, initial encounter: Secondary | ICD-10-CM | POA: Diagnosis not present

## 2015-09-18 LAB — POCT HEMOGLOBIN: Hemoglobin: 14.4 g/dL (ref 14.1–18.1)

## 2015-09-18 MED ORDER — CEPHALEXIN 500 MG PO CAPS
ORAL_CAPSULE | ORAL | Status: DC
Start: 2015-09-18 — End: 2016-04-29

## 2015-09-18 NOTE — Progress Notes (Signed)
Office Note 09/18/2015  CC:  Chief Complaint  Patient presents with  . Establish Care    transfer from Dr. Linna Darner. Pt is not fasting.  . Fall    at home while working in the yard, injured lower left leg.     HPI:  Mike Day is a 70 y.o. White male who is here with his wife to establish/transfer care. Patient's most recent primary MD: Dr. Linna Darner (retired). Old records in EPIC/HL reviewed prior to or during today's visit.  Two weeks ago he was in woods, slipped and hit pretibial region on rock.  It has gradually been enlarging and turning red, warm to touch.  Pain is mild in the area.  Says L foot feels cold.   Past Medical History  Diagnosis Date  . Atrial flutter ([place]) 2008    Status post [facility] Dr Cristopher Peru  . Hyperlipidemia   . Hypertension   . Chronic atrial fibrillation (H[facility])     Eliquis  . Hx of adenomatous colonic polyps   . Anemia     Early 20's  . History of echocardiogram     Echo 12/14Severe basal septal hypertrophy, moderate LVH, EF 60-65%, moderate LAE, mild RAE   . Prostate cancer [facility]) 2006    Prostatectomy  . Diabetes mellitus without complication ([place])     Dx by A1c criteria 2014    Past Surgical History  Procedure Laterality Date  . 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 Mohs surgery      left hand  . Colonoscopy w/ polypectomy  2003;2010;04/2014    Tubular adenoma: recall 5 yrs (after 04/2019)  . Septoplasty  Age 52  . Hernia repair  07/2008    Dr.Martin  . Prostatectomy  2006    Robotic for adenocarcinoma; Dr Lawerance Bach    Family History  Problem Relation Age of Onset  . Lung disease Father     Black Lung  . Heart disease Father     Congenital  . Emphysema Father   . Diabetes Mother   . Stroke Mother 52  . Heart attack Brother 67    sudden death  . Cancer Neg Hx   . Hypertension Brother     Social History   Social History  . Marital Status: Married    Spouse Name: N/A  . Number of Children:  N/A  . Years of Education: N/A   Occupational History  . Not on file.   Social History Main Topics  . Smoking status: Former Smoker    Quit date: 09/06/2007  . Smokeless tobacco: Never Used     Comment: smoked 1963 -2009; total 5 packs of cigarettes in life . Cigars < 1 / day on average 1992-2009  . Alcohol Use: 4.2 oz/week    7 Glasses of wine per week     Comment: Socially  . Drug Use: No  . Sexual Activity: Not on file   Other Topics Concern  . Not on file   Social History Narrative    Outpatient Encounter Prescriptions as of 09/18/2015  Medication Sig  . amLODipine (NORVASC) 5 MG tablet Take 1 tablet (5 mg total) by mouth daily.  [name] apixaban (ELIQUIS) 5 MG TABS tablet Take 1 tablet (5 mg total) by mouth 2 (two) times daily.  [name] lisinopril (PRINIVIL,ZESTRIL) 40 MG tablet Take one tablet by mouth one time daily  . metoprolol tartrate (LOPRESSOR) 25 MG tablet Take 0.5 tablets (12.5 mg total)  by mouth 2 (two) times daily.  . simvastatin (ZOCOR) 40 MG tablet Take 0.5 tablets (20 mg total) by mouth at bedtime.   No facility-administered encounter medications on file as of 09/18/2015.    Allergies  Allergen Reactions  . Levofloxacin     REACTION: Rash  . Tape Other (See Comments)    ADHESIVE TAPE; BLISTERS,    ROS Review of Systems  Constitutional: Negative for fever and fatigue.  HENT: Negative for congestion and sore throat.   Eyes: Negative for visual disturbance.  Respiratory: Negative for cough.   Cardiovascular: Negative for chest pain.  Gastrointestinal: Negative for nausea and abdominal pain.  Genitourinary: Negative for dysuria.  Musculoskeletal: Negative for back pain and joint swelling.  Skin: Negative for rash.  Neurological: Negative for weakness and headaches.  Hematological: Negative for adenopathy.    PE; Blood pressure 147/89, pulse 60, temperature 97.8 F (36.6 C), temperature source Oral, resp. rate 16, height 6\' 2"  (1.88 m), weight 214 lb 8 oz  (97.297 kg), SpO2 97 %. Gen: Alert, well appearing.  Patient is oriented to person, place, time, and situation. [id number]: no injection, icteris, swelling, or exudate.  EOMI, PERRLA. Mouth: lips without lesion/swelling.  Oral mucosa pink and moist. Oropharynx without erythema, exudate, or swelling.  CV: Irreg irreg rhythm, rate about 60, no m/r/g Chest is clear, no wheezing or rales. Normal symmetric air entry throughout both lung fields. No chest wall deformities or tenderness.   Patient is not limping. EXT: R LL without edema, erythema, or skin lesion Left LL with golf ball sized subQ swelling in upper anterior tibial region, abrasion present over this. 1-2+ edema in entire LL down to foot in this leg.  Mild erythema inferior aspect of L LL from the hematoma down to ankle, warmth, mild diffuse tenderness in LLL. I cannot feel DP or PT pulses in either LL.  His feet have no cyanosis.  Cap refill on L is <2 sec.  Pertinent labs:   POCT Hb today 14.4  Lab Results  Component Value Date   TSH 0.94 07/07/2015   Lab Results  Component Value Date   WBC 6.8 05/04/2015   HGB 14.8 05/04/2015   HCT 43.4 05/04/2015   MCV 97.5 05/04/2015   PLT 172.0 05/04/2015   Lab Results  Component Value Date   CREATININE 0.93 05/04/2015   BUN 11 05/04/2015   NA 135 05/04/2015   K 4.6 05/04/2015   CL 99 05/04/2015   CO2 29 05/04/2015   Lab Results  Component Value Date   ALT 19 07/07/2015   AST 21 07/07/2015   ALKPHOS 58 07/07/2015   BILITOT 1.0 07/07/2015   Lab Results  Component Value Date   CHOL 131 07/07/2015   Lab Results  Component Value Date   HDL 41.20 07/07/2015   Lab Results  Component Value Date   LDLCALC 72 07/07/2015   Lab Results  Component Value Date   TRIG 88.0 07/07/2015   Lab Results  Component Value Date   CHOLHDL 3 07/07/2015   Lab Results  Component Value Date   PSA 0.00* 12/03/2010   ASSESSMENT AND PLAN:   Left tibia hematoma, slowly expanding per  pt/wife's report--more significant expansion in the last 2d, patient on eliquis for a-fib. Admittedly, his exam is more consistent with a hematoma that is gradually dissipating into the subQ tissues of the LL with gravity more than it looks like expanding hematoma from ongoing bleeding.  His stable hemoglobin fits this picture  as well. However, with this patient I would definitely want to have the opinion of an orthopedist, so [facility] [facility] has been nice enough to agree to see him this afternoon.   In the meantime, I told him that he MAY have to d/c his eliquis, depending on what the orthopedist thinks. Also, I started him on cephalexin for possible LLE cellulitis that may be complicating this problem.  An After Visit Summary was printed and given to the patient.  Follow up: do be determined based on ortho eval today.

## 2015-09-18 NOTE — Progress Notes (Signed)
Pre visit review using our clinic review tool, if applicable. No additional management support is needed unless otherwise documented below in the visit note. 

## 2015-09-18 NOTE — Telephone Encounter (Signed)
Talked to pt/wife about his ortho visit today. Orthopedist was very good. No compartment syndrome. Advised to stay on eliquis. Two additional antibiotics were added to my cephalexin. He has f/u with ortho in 2 wks.

## 2015-10-05 DIAGNOSIS — L03116 Cellulitis of left lower limb: Secondary | ICD-10-CM | POA: Diagnosis not present

## 2015-10-05 DIAGNOSIS — S8012XD Contusion of left lower leg, subsequent encounter: Secondary | ICD-10-CM | POA: Diagnosis not present

## 2015-10-12 ENCOUNTER — Telehealth: Payer: Self-pay | Admitting: Cardiovascular Disease

## 2015-10-12 ENCOUNTER — Telehealth: Payer: Self-pay | Admitting: *Deleted

## 2015-10-12 DIAGNOSIS — I10 Essential (primary) hypertension: Secondary | ICD-10-CM

## 2015-10-12 DIAGNOSIS — E785 Hyperlipidemia, unspecified: Secondary | ICD-10-CM

## 2015-10-12 MED ORDER — METOPROLOL TARTRATE 25 MG PO TABS: 12.5000 mg | ORAL_TABLET | Freq: Two times a day (BID) | ORAL | Status: DC

## 2015-10-12 MED ORDER — AMLODIPINE BESYLATE 5 MG PO TABS: 5.0000 mg | ORAL_TABLET | Freq: Every day | ORAL | Status: DC

## 2015-10-12 MED ORDER — LISINOPRIL 40 MG PO TABS: ORAL_TABLET | ORAL | Status: DC

## 2015-10-12 MED ORDER — SIMVASTATIN 40 MG PO TABS: 20.0000 mg | ORAL_TABLET | Freq: Every day | ORAL | Status: DC

## 2015-10-12 MED ORDER — APIXABAN 5 MG PO TABS: 5.0000 mg | ORAL_TABLET | Freq: Two times a day (BID) | ORAL | Status: DC

## 2015-10-12 NOTE — Telephone Encounter (Signed)
Prior auth sent to [facility] for Eliquis 5mg 

## 2015-10-12 NOTE — Telephone Encounter (Signed)
[facility], he will have to have the refills moved to [facility], he has a years worth on all medications requested, pt expressed understanding. did tell him to call back if problems with the pharmacies.

## 2015-10-12 NOTE — Telephone Encounter (Signed)
New messages    Patient calling   Member  Id [id number]   *STAT* If patient is at the pharmacy, call can be transferred to refill team.   1. Which medications need to be refilled? (please list name of each medication and dose if known) amlodipine  5 mg /  Lisinopril  40 mg / simvastatin  40 mg / metoprolol  25 mg /   2. Which pharmacy/location (including street and city if local pharmacy) is medication to be sent to? [place]  - [phone]  3. Do they need a 30 day or 90 day supply?  90 days supply

## 2015-10-13 ENCOUNTER — Other Ambulatory Visit: Payer: Self-pay | Admitting: Cardiovascular Disease

## 2015-10-13 ENCOUNTER — Telehealth: Payer: Self-pay

## 2015-10-13 DIAGNOSIS — I10 Essential (primary) hypertension: Secondary | ICD-10-CM

## 2015-10-13 DIAGNOSIS — E785 Hyperlipidemia, unspecified: Secondary | ICD-10-CM

## 2015-10-13 MED ORDER — APIXABAN 5 MG PO TABS: 5.0000 mg | ORAL_TABLET | Freq: Two times a day (BID) | ORAL | Status: DC

## 2015-10-13 MED ORDER — AMLODIPINE BESYLATE 5 MG PO TABS: 5.0000 mg | ORAL_TABLET | Freq: Every day | ORAL | Status: DC

## 2015-10-13 MED ORDER — SIMVASTATIN 40 MG PO TABS: 20.0000 mg | ORAL_TABLET | Freq: Every day | ORAL | Status: DC

## 2015-10-13 MED ORDER — METOPROLOL TARTRATE 25 MG PO TABS: 12.5000 mg | ORAL_TABLET | Freq: Two times a day (BID) | ORAL | Status: DC

## 2015-10-13 MED ORDER — LISINOPRIL 40 MG PO TABS: ORAL_TABLET | ORAL | Status: DC

## 2015-10-13 NOTE — Telephone Encounter (Signed)
Eliquis approved through 09/04/2016. [id number].

## 2015-10-14 ENCOUNTER — Encounter: Payer: Self-pay | Admitting: Physician Assistant

## 2015-10-16 ENCOUNTER — Other Ambulatory Visit: Payer: Self-pay | Admitting: Cardiovascular Disease

## 2015-10-16 DIAGNOSIS — I10 Essential (primary) hypertension: Secondary | ICD-10-CM

## 2015-10-16 MED ORDER — APIXABAN 5 MG PO TABS: 5.0000 mg | ORAL_TABLET | Freq: Two times a day (BID) | ORAL | Status: DC

## 2015-10-16 MED ORDER — AMLODIPINE BESYLATE 5 MG PO TABS: 5.0000 mg | ORAL_TABLET | Freq: Every day | ORAL | Status: DC

## 2015-10-16 MED ORDER — LISINOPRIL 40 MG PO TABS: ORAL_TABLET | ORAL | Status: DC

## 2015-10-16 NOTE — Telephone Encounter (Signed)
Rx was sent to the wrong pharmacy. Rx resent to [facility] as requested. Confirmation received.

## 2015-11-02 DIAGNOSIS — L03116 Cellulitis of left lower limb: Secondary | ICD-10-CM | POA: Diagnosis not present

## 2015-11-02 DIAGNOSIS — S8012XD Contusion of left lower leg, subsequent encounter: Secondary | ICD-10-CM | POA: Diagnosis not present

## 2015-11-20 DIAGNOSIS — L570 Actinic keratosis: Secondary | ICD-10-CM | POA: Diagnosis not present

## 2015-11-20 DIAGNOSIS — Z85828 Personal history of other malignant neoplasm of skin: Secondary | ICD-10-CM | POA: Diagnosis not present

## 2015-11-20 DIAGNOSIS — L738 Other specified follicular disorders: Secondary | ICD-10-CM | POA: Diagnosis not present

## 2015-12-28 DIAGNOSIS — S8012XD Contusion of left lower leg, subsequent encounter: Secondary | ICD-10-CM | POA: Diagnosis not present

## 2016-01-13 ENCOUNTER — Other Ambulatory Visit: Payer: Self-pay

## 2016-01-13 DIAGNOSIS — E785 Hyperlipidemia, unspecified: Secondary | ICD-10-CM

## 2016-01-13 MED ORDER — SIMVASTATIN 20 MG PO TABS: 20.0000 mg | ORAL_TABLET | Freq: Every day | ORAL | Status: DC

## 2016-01-14 ENCOUNTER — Telehealth: Payer: Self-pay | Admitting: *Deleted

## 2016-01-14 DIAGNOSIS — H02834 Dermatochalasis of left upper eyelid: Secondary | ICD-10-CM | POA: Diagnosis not present

## 2016-01-14 DIAGNOSIS — H02831 Dermatochalasis of right upper eyelid: Secondary | ICD-10-CM | POA: Diagnosis not present

## 2016-01-14 DIAGNOSIS — H25813 Combined forms of age-related cataract, bilateral: Secondary | ICD-10-CM | POA: Diagnosis not present

## 2016-01-14 NOTE — Telephone Encounter (Signed)
Spoke with patient. Informed him this was already approved back in February. I sent a copy of the approval letter to [facility]

## 2016-01-14 NOTE — Telephone Encounter (Signed)
Per message left by patient, [facility] is requiring a prior authorization for the eliquis. The number that he provided to call was [phone]. Thanks, MI

## 2016-01-19 ENCOUNTER — Telehealth: Payer: Self-pay | Admitting: Cardiovascular Disease

## 2016-01-19 ENCOUNTER — Other Ambulatory Visit: Payer: Self-pay

## 2016-01-19 DIAGNOSIS — E785 Hyperlipidemia, unspecified: Secondary | ICD-10-CM

## 2016-01-19 MED ORDER — SIMVASTATIN 20 MG PO TABS: 20.0000 mg | ORAL_TABLET | Freq: Every day | ORAL | Status: DC

## 2016-01-19 MED ORDER — ATORVASTATIN CALCIUM 20 MG PO TABS: 20.0000 mg | ORAL_TABLET | Freq: Every day | ORAL | Status: DC

## 2016-01-19 NOTE — Telephone Encounter (Signed)
New message       Pt c/o medication issue:  1. Name of Medication: Lipitor and Zocor  2. How are you currently taking this medication (dosage and times per day)? Lipitor 20mg  or Zocor 20mg   3. Are you having a reaction (difficulty breathing--STAT)? no  4. What is your medication issue? The pharmacy tech Pablo Ledger was making sure which medication the doctor wanted, Please call back on provided number

## 2016-01-19 NOTE — Telephone Encounter (Signed)
According to fax sent to [facility], therapy was changed to Lipitor 20 mg by mouth at bedtime from Simvastatin 40 mg. These changes were made due to Simvastatin and norvasc new FDA guidelines. Will send in new order for lipitor to [facility].

## 2016-03-11 DIAGNOSIS — C44219 Basal cell carcinoma of skin of left ear and external auricular canal: Secondary | ICD-10-CM | POA: Diagnosis not present

## 2016-03-11 DIAGNOSIS — D485 Neoplasm of uncertain behavior of skin: Secondary | ICD-10-CM | POA: Diagnosis not present

## 2016-03-11 DIAGNOSIS — D225 Melanocytic nevi of trunk: Secondary | ICD-10-CM | POA: Diagnosis not present

## 2016-03-11 DIAGNOSIS — L570 Actinic keratosis: Secondary | ICD-10-CM | POA: Diagnosis not present

## 2016-03-11 DIAGNOSIS — Z85828 Personal history of other malignant neoplasm of skin: Secondary | ICD-10-CM | POA: Diagnosis not present

## 2016-03-11 DIAGNOSIS — C44519 Basal cell carcinoma of skin of other part of trunk: Secondary | ICD-10-CM | POA: Diagnosis not present

## 2016-03-29 ENCOUNTER — Other Ambulatory Visit: Payer: Self-pay

## 2016-03-29 NOTE — Telephone Encounter (Signed)
Received fax from [facility] for simvastatin, according to previous telephone note. Med was discontinued and changed to lipitor. Fax returned to pharmacy with note stating med discontinued.

## 2016-03-31 ENCOUNTER — Other Ambulatory Visit: Payer: Self-pay | Admitting: *Deleted

## 2016-04-12 ENCOUNTER — Other Ambulatory Visit: Payer: Self-pay

## 2016-04-14 ENCOUNTER — Encounter: Payer: Self-pay | Admitting: Physician Assistant

## 2016-04-14 ENCOUNTER — Ambulatory Visit: Payer: Medicare Other | Admitting: Physician Assistant

## 2016-04-18 ENCOUNTER — Telehealth: Payer: Self-pay | Admitting: *Deleted

## 2016-04-18 ENCOUNTER — Encounter: Payer: Self-pay | Admitting: Physician Assistant

## 2016-04-18 NOTE — Telephone Encounter (Signed)
infromed pthe has Lipitor on file, needs to call and have pharmacy send him a refill. pt expressed understanding.

## 2016-04-19 ENCOUNTER — Other Ambulatory Visit: Payer: Self-pay | Admitting: *Deleted

## 2016-04-19 MED ORDER — SIMVASTATIN 20 MG PO TABS: 20.0000 mg | ORAL_TABLET | Freq: Every day | ORAL | 3 refills | Status: DC

## 2016-04-19 NOTE — Telephone Encounter (Signed)
See MY CHART message for conversation about stopping Lipitor and resuming Simvastatin 20 mg at bedtime ok per Dr. Johnsie Cancel. Rx sent to [facility] Rx # 90 x 3 .

## 2016-04-28 NOTE — Progress Notes (Signed)
Cardiology Office Note:    Date:  04/29/2016   ID:  ERO DEVENEY, DOB 10-16-1945, MRN [id number]  PCP:  Tammi Sou, MD  Cardiologist:  [name], PA-C    Electrophysiologist:  n/a  Referring MD: Tammi Sou, MD   Chief Complaint  Patient presents with  . Atrial Fibrillation    follow up    History of Present Illness:    Mike Day is a 70 y.o. male with a hx of atrial flutter status post RFCA, chronic atrial fibrillation, HTN, HL. He established with Dr. Johnsie Cancel 08/2013. He was placed on Eliquis given stroke risk (CHADS2-VASc=2).    I saw him last 8/16.  He wanted to start running for exercise and was planning a trip to [place]. [place].  I had him do a FU ETT that demonstrated no ST changes.  He did have a hypertensive BP response and his medications were adjusted.  He returns for FU.    He is doing well. Here with his wife today. He had a large wound on his L leg earlier this year.  This became infected and he was followed by Dr. [name] for 4 months.  His leg is now healed. He is feeling well.  The patient denies chest pain, shortness of breath, syncope, orthopnea, PND or significant pedal edema.  Denies any bleeding issues.   Prior CV studies that were reviewed today include:    ETT 10/16  Blood pressure demonstrated a hypertensive response to exercise.  There was no ST segment deviation noted during stress.  Echo 09/02/13  Severe basal septal hypertrophy, moderate LVH, EF 60-65%, moderate LAE, mild RAE   Past Medical History:  Diagnosis Date  . Anemia    Early 20's  . Atrial flutter ([place]) 2008   Status post RFCA Dr Cristopher Peru  . Chronic atrial fibrillation (HCC)    Eliquis  . Diabetes mellitus without complication ([place])    Dx by A1c criteria 2014  . History of echocardiogram    Echo 12/14Severe basal septal hypertrophy, moderate LVH, EF 60-65%, moderate LAE, mild RAE   . Hx of adenomatous colonic polyps   . Hyperlipidemia   . Hypertension   .  Prostate cancer [facility]) 2006   Prostatectomy    Past Surgical History:  Procedure Laterality Date  . COLONOSCOPY W/ POLYPECTOMY  2003;2010;04/2014   Tubular adenoma: recall 5 yrs (after 04/2019)  . HERNIA REPAIR  07/2008   Dr.Martin  . MOHS SURGERY     left hand  . 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 RADIOFREQUENCY ABLATION  01/15/2007   for ectopic atrial foci  . SEPTOPLASTY  Age 70    Current Medications: Outpatient Medications Prior to Visit  Medication Sig Dispense Refill  . amLODipine (NORVASC) 5 MG tablet Take 1 tablet (5 mg total) by mouth daily. 90 tablet 2  . apixaban (ELIQUIS) 5 MG TABS tablet Take 1 tablet (5 mg total) by mouth 2 (two) times daily. 180 tablet 2  . lisinopril (PRINIVIL,ZESTRIL) 40 MG tablet Take one tablet by mouth one time daily 90 tablet 2  . metoprolol tartrate (LOPRESSOR) 25 MG tablet Take 0.5 tablets (12.5 mg total) by mouth 2 (two) times daily. 90 tablet 1  . simvastatin (ZOCOR) 20 MG tablet Take 1 tablet (20 mg total) by mouth at bedtime. 90 tablet 3  . cephALEXin (KEFLEX) 500 MG capsule 1 cap po tid x 10d 30 capsule 0   No facility-administered  medications prior to visit.       Allergies:   Levofloxacin and Tape   Social History   Social History  . Marital status: Married    Spouse name: N/A  . Number of children: N/A  . Years of education: N/A   Social History Main Topics  . Smoking status: Former Smoker    Quit date: 09/06/2007  . Smokeless tobacco: Never Used     Comment: smoked 1963 -2009; total 5 packs of cigarettes in life . Cigars < 1 / day on average 1992-2009  . Alcohol use 4.2 oz/week    7 Glasses of wine per week     Comment: Socially  . Drug use: No  . Sexual activity: Not Asked   Other Topics Concern  . None   Social History Narrative   Married, 1 son, 2 grand-daughters.   Educ: Masters degree   Occup: retired from [employer].   No tobacco.   Alcohol: 1 glass red wine per night.    Was once a long distance runner.     Family History:  The patient's family history includes Diabetes in his mother; Emphysema in his father; Heart attack (age of onset: 39) in his brother; Heart disease in his father; Hypertension in his brother; Lung disease in his father; Stroke (age of onset: 26) in his mother.   ROS:   Please see the history of present illness.    ROS All other systems reviewed and are negative.   EKGs/Labs/Other Test Reviewed:    EKG:  EKG is  ordered today.  The ekg ordered today demonstrates AFib, HR 51, no significant change since last tracing  Recent Labs: 05/04/2015: BUN 11; Creatinine, Ser 0.93; Platelets 172.0; Potassium 4.6; Sodium 135 07/07/2015: ALT 19; TSH 0.94 09/18/2015: Hemoglobin 14.4   Recent Lipid Panel    Component Value Date/Time   CHOL 131 07/07/2015 1146   TRIG 88.0 07/07/2015 1146   HDL 41.20 07/07/2015 1146   CHOLHDL 3 07/07/2015 1146   VLDL 17.6 07/07/2015 1146   LDLCALC 72 07/07/2015 1146     Physical Exam:    VS:  BP 110/60   Pulse (!) 51   Ht 6\' 2"  (1.88 m)   Wt 210 lb 12.8 oz (95.6 kg)   BMI 27.07 kg/m     Wt Readings from Last 3 Encounters:  04/29/16 210 lb 12.8 oz (95.6 kg)  09/18/15 214 lb 8 oz (97.3 kg)  07/07/15 215 lb 12 oz (97.9 kg)     Physical Exam  Constitutional: He is oriented to person, place, and time. He appears well-developed and well-nourished. No distress.  HENT:  Head: Normocephalic and atraumatic.  Eyes: No scleral icterus.  Neck: Normal range of motion. No JVD present. Carotid bruit is not present.  Cardiovascular: Normal rate, S1 normal and S2 normal.  An irregularly irregular rhythm present. Exam reveals no gallop and no friction rub.   No murmur heard. Pulmonary/Chest: Effort normal and breath sounds normal. He has no wheezes. He has no rhonchi. He has no rales.  Abdominal: Soft. There is no tenderness.  Musculoskeletal: He exhibits no edema.  Neurological: He is alert and oriented to  person, place, and time.  Skin: Skin is warm and dry.  Psychiatric: He has a normal mood and affect.    ASSESSMENT:    1. Chronic atrial fibrillation ([place])   2. Essential hypertension    PLAN:    In order of problems listed above:  1. Chronic atrial  fibrillation:  Rate is controlled.  CHADS2-VASc=2.  Continue long term anticoagulation with Eliquis.  BMET, CBC today.    2. Essential hypertension, benign: Controlled.  Continue current Rx.  Dispo - I explained our new team based care model to the patient today and that I am not on the same team as Dr. Johnsie Cancel.  He prefers to FU with me.  I will d/w Dr. Saunders Revel at my FU with him in 1 year to see if he will follow the patient with me.   Medication Adjustments/Labs and Tests Ordered: Current medicines are reviewed at length with the patient today.  Concerns regarding medicines are outlined above.  Medication changes, Labs and Tests ordered today are outlined in the Patient Instructions noted below. Patient Instructions  Medication Instructions:  Your physician recommends that you continue on your current medications as directed. Please refer to the Current Medication list given to you today. Labwork: TODAY BMET, CBC W/DIFF Testing/Procedures: NONE Follow-Up: Your physician wants you to follow-up in: [place], [place] You will receive a reminder letter in the mail two months in advance. If you don't receive a letter, please call our office to schedule the follow-up appointment. Any Other Special Instructions Will Be Listed Below (If Applicable). If you need a refill on your cardiac medications before your next appointment, please call your pharmacy.  Signed, [name], PA-C  04/29/2016 1:01 PM    [facility] [place], [place], [place]  [address] Phone: [phone]; Fax: [phone]

## 2016-04-29 ENCOUNTER — Encounter: Payer: Self-pay | Admitting: Physician Assistant

## 2016-04-29 ENCOUNTER — Ambulatory Visit (INDEPENDENT_AMBULATORY_CARE_PROVIDER_SITE_OTHER): Payer: Medicare Other | Admitting: Physician Assistant

## 2016-04-29 VITALS — BP 110/60 | HR 51 | Ht 74.0 in | Wt 210.8 lb

## 2016-04-29 DIAGNOSIS — I10 Essential (primary) hypertension: Secondary | ICD-10-CM | POA: Diagnosis not present

## 2016-04-29 DIAGNOSIS — I482 Chronic atrial fibrillation, unspecified: Secondary | ICD-10-CM

## 2016-04-29 LAB — CBC WITH DIFFERENTIAL/PLATELET
BASOS ABS: 0 {cells}/uL (ref 0–200)
Basophils Relative: 0 %
EOS ABS: 112 {cells}/uL (ref 15–500)
Eosinophils Relative: 2 %
HCT: 42.4 % (ref 38.5–50.0)
Hemoglobin: 15 g/dL (ref 13.2–17.1)
LYMPHS PCT: 17 %
Lymphs Abs: 952 cells/uL (ref 850–3900)
MCH: 33.3 pg — AB (ref 27.0–33.0)
MCHC: 35.4 g/dL (ref 32.0–36.0)
MCV: 94 fL (ref 80.0–100.0)
MONOS PCT: 22 %
MPV: 9.7 fL (ref 7.5–12.5)
Monocytes Absolute: 1232 cells/uL — ABNORMAL HIGH (ref 200–950)
Neutro Abs: 3304 cells/uL (ref 1500–7800)
Neutrophils Relative %: 59 %
PLATELETS: 177 10*3/uL (ref 140–400)
RBC: 4.51 MIL/uL (ref 4.20–5.80)
RDW: 13.4 % (ref 11.0–15.0)
WBC: 5.6 10*3/uL (ref 3.8–10.8)

## 2016-04-29 LAB — BASIC METABOLIC PANEL
BUN: 15 mg/dL (ref 7–25)
CALCIUM: 9.5 mg/dL (ref 8.6–10.3)
CHLORIDE: 100 mmol/L (ref 98–110)
CO2: 26 mmol/L (ref 20–31)
CREATININE: 1.18 mg/dL (ref 0.70–1.18)
Glucose, Bld: 105 mg/dL — ABNORMAL HIGH (ref 65–99)
Potassium: 4.5 mmol/L (ref 3.5–5.3)
SODIUM: 135 mmol/L (ref 135–146)

## 2016-04-29 NOTE — Patient Instructions (Addendum)
Medication Instructions:  Your physician recommends that you continue on your current medications as directed. Please refer to the Current Medication list given to you today. Labwork: TODAY BMET, CBC W/DIFF Testing/Procedures: NONE Follow-Up: Your physician wants you to follow-up in: [place], [place] You will receive a reminder letter in the mail two months in advance. If you don't receive a letter, please call our office to schedule the follow-up appointment. Any Other Special Instructions Will Be Listed Below (If Applicable). If you need a refill on your cardiac medications before your next appointment, please call your pharmacy.

## 2016-05-02 ENCOUNTER — Telehealth: Payer: Self-pay | Admitting: *Deleted

## 2016-05-02 NOTE — Telephone Encounter (Signed)
Pt notified of lab results by phone with verbal understanding.  

## 2016-06-01 ENCOUNTER — Ambulatory Visit (INDEPENDENT_AMBULATORY_CARE_PROVIDER_SITE_OTHER): Payer: Medicare Other

## 2016-06-01 DIAGNOSIS — Z23 Encounter for immunization: Secondary | ICD-10-CM

## 2016-06-15 DIAGNOSIS — N528 Other male erectile dysfunction: Secondary | ICD-10-CM | POA: Diagnosis not present

## 2016-06-15 DIAGNOSIS — Z8546 Personal history of malignant neoplasm of prostate: Secondary | ICD-10-CM | POA: Diagnosis not present

## 2016-06-15 DIAGNOSIS — C61 Malignant neoplasm of prostate: Secondary | ICD-10-CM | POA: Diagnosis not present

## 2016-06-22 DIAGNOSIS — C44719 Basal cell carcinoma of skin of left lower limb, including hip: Secondary | ICD-10-CM | POA: Diagnosis not present

## 2016-06-22 DIAGNOSIS — Z85828 Personal history of other malignant neoplasm of skin: Secondary | ICD-10-CM | POA: Diagnosis not present

## 2016-06-22 DIAGNOSIS — L570 Actinic keratosis: Secondary | ICD-10-CM | POA: Diagnosis not present

## 2016-06-22 DIAGNOSIS — D485 Neoplasm of uncertain behavior of skin: Secondary | ICD-10-CM | POA: Diagnosis not present

## 2016-07-05 ENCOUNTER — Other Ambulatory Visit: Payer: Self-pay | Admitting: Cardiovascular Disease

## 2016-07-15 DIAGNOSIS — L570 Actinic keratosis: Secondary | ICD-10-CM | POA: Diagnosis not present

## 2016-07-22 DIAGNOSIS — L570 Actinic keratosis: Secondary | ICD-10-CM | POA: Diagnosis not present

## 2016-08-01 DIAGNOSIS — L570 Actinic keratosis: Secondary | ICD-10-CM | POA: Diagnosis not present

## 2016-08-05 ENCOUNTER — Other Ambulatory Visit: Payer: Self-pay | Admitting: Cardiovascular Disease

## 2016-08-08 DIAGNOSIS — L570 Actinic keratosis: Secondary | ICD-10-CM | POA: Diagnosis not present

## 2016-08-11 ENCOUNTER — Telehealth: Payer: Self-pay | Admitting: Family Medicine

## 2016-08-11 DIAGNOSIS — E7800 Pure hypercholesterolemia, unspecified: Secondary | ICD-10-CM

## 2016-08-11 DIAGNOSIS — I10 Essential (primary) hypertension: Secondary | ICD-10-CM

## 2016-08-11 NOTE — Telephone Encounter (Signed)
Please put in orders for labs. Thanks.

## 2016-08-11 NOTE — Telephone Encounter (Signed)
Patient requesting to have order for cholesterol checked,  appt with health coach has been cancelled.

## 2016-08-11 NOTE — Telephone Encounter (Signed)
SW patient. He was agreeable to a general checkup on Friday 08/19/16 with Dr. Anitra Lauth with fasting labs 08/16/16.

## 2016-08-11 NOTE — Telephone Encounter (Signed)
OK- labs ordered

## 2016-08-11 NOTE — Telephone Encounter (Signed)
Patient states he met with health coach last year at Dr. Clayborn Heron office and this service was not beneficial to him.  Patient requested to cancel appt and have cholesterol checked.

## 2016-08-12 ENCOUNTER — Other Ambulatory Visit: Payer: Self-pay

## 2016-08-16 ENCOUNTER — Other Ambulatory Visit (INDEPENDENT_AMBULATORY_CARE_PROVIDER_SITE_OTHER): Payer: Medicare Other

## 2016-08-16 ENCOUNTER — Other Ambulatory Visit: Payer: Self-pay | Admitting: Cardiovascular Disease

## 2016-08-16 DIAGNOSIS — I10 Essential (primary) hypertension: Secondary | ICD-10-CM

## 2016-08-16 DIAGNOSIS — E7800 Pure hypercholesterolemia, unspecified: Secondary | ICD-10-CM | POA: Diagnosis not present

## 2016-08-16 LAB — COMPREHENSIVE METABOLIC PANEL
ALT: 14 U/L (ref 0–53)
AST: 16 U/L (ref 0–37)
Albumin: 4.1 g/dL (ref 3.5–5.2)
Alkaline Phosphatase: 53 U/L (ref 39–117)
BUN: 13 mg/dL (ref 6–23)
CHLORIDE: 99 meq/L (ref 96–112)
CO2: 28 mEq/L (ref 19–32)
Calcium: 9.2 mg/dL (ref 8.4–10.5)
Creatinine, Ser: 0.88 mg/dL (ref 0.40–1.50)
GFR: 90.9 mL/min (ref >60.00)
GLUCOSE: 132 mg/dL — AB (ref 70–99)
POTASSIUM: 4.3 meq/L (ref 3.5–5.1)
SODIUM: 134 meq/L — AB (ref 135–145)
Total Bilirubin: 0.9 mg/dL (ref 0.2–1.2)
Total Protein: 6.4 g/dL (ref 6.0–8.3)

## 2016-08-16 LAB — LIPID PANEL
Cholesterol: 124 mg/dL (ref 0–200)
HDL: 39.4 mg/dL (ref >39.00)
LDL CALC: 72 mg/dL (ref 0–99)
NONHDL: 84.54
Total CHOL/HDL Ratio: 3
Triglycerides: 65 mg/dL (ref 0.0–149.0)
VLDL: 13 mg/dL (ref 0.0–40.0)

## 2016-08-17 ENCOUNTER — Other Ambulatory Visit (INDEPENDENT_AMBULATORY_CARE_PROVIDER_SITE_OTHER): Payer: Medicare Other

## 2016-08-17 DIAGNOSIS — E119 Type 2 diabetes mellitus without complications: Secondary | ICD-10-CM | POA: Diagnosis not present

## 2016-08-17 LAB — HEMOGLOBIN A1C: Hgb A1c MFr Bld: 6.7 % — ABNORMAL HIGH (ref 4.6–6.5)

## 2016-08-18 ENCOUNTER — Encounter: Payer: Self-pay | Admitting: Family Medicine

## 2016-08-19 ENCOUNTER — Encounter: Payer: Medicare Other | Admitting: Family Medicine

## 2016-08-19 ENCOUNTER — Encounter: Payer: Self-pay | Admitting: Family Medicine

## 2016-08-19 ENCOUNTER — Ambulatory Visit (INDEPENDENT_AMBULATORY_CARE_PROVIDER_SITE_OTHER): Payer: Medicare Other | Admitting: Family Medicine

## 2016-08-19 ENCOUNTER — Ambulatory Visit: Payer: Medicare Other

## 2016-08-19 VITALS — BP 196/101 | HR 58 | Temp 98.0°F | Resp 16 | Ht 74.0 in | Wt 210.8 lb

## 2016-08-19 DIAGNOSIS — E7800 Pure hypercholesterolemia, unspecified: Secondary | ICD-10-CM | POA: Diagnosis not present

## 2016-08-19 DIAGNOSIS — I10 Essential (primary) hypertension: Secondary | ICD-10-CM

## 2016-08-19 DIAGNOSIS — L570 Actinic keratosis: Secondary | ICD-10-CM | POA: Diagnosis not present

## 2016-08-19 DIAGNOSIS — Z1159 Encounter for screening for other viral diseases: Secondary | ICD-10-CM | POA: Diagnosis not present

## 2016-08-19 DIAGNOSIS — I482 Chronic atrial fibrillation, unspecified: Secondary | ICD-10-CM

## 2016-08-19 DIAGNOSIS — E119 Type 2 diabetes mellitus without complications: Secondary | ICD-10-CM | POA: Diagnosis not present

## 2016-08-19 LAB — HEPATITIS C ANTIBODY: HCV Ab: NEGATIVE

## 2016-08-19 NOTE — Progress Notes (Signed)
OFFICE VISIT  08/19/2016   CC:  Chief Complaint  Patient presents with  . Follow-up    RCI, labs done 08/17/16, pt is not fasting.    HPI:    Patient is a 70 y.o. Caucasian male who presents for f/u diet controlled DM 2, HTN, HLD, chronic atrial fibrillation.  HTN: home bp monitoring 120s/80s consistently.  Pt states he's had lots of physical and emotional stress with taking care of his wife lately b/c she had an operation. We reviewed recent labs done fasting on 08/16/16.  These were great.  DM: diet controlled, admits he can do better with diet.  HLD: tolerating simvastatin daily.  No side effects.  A-fib: control good but he had stress test and his cardiologist told him he can no longer run for exercise, apparently b/c it worsened his a-fib per pt. He walks regularly for exercise.  Past Medical History:  Diagnosis Date  . Anemia    Early 20's  . Atrial flutter ([place]) 2008   Status post RFCA Dr Cristopher Peru  . Chronic atrial fibrillation (HCC)    Eliquis  . Diabetes mellitus without complication ([place])    Dx by A1c criteria 2014  . History of echocardiogram    Echo 12/14Severe basal septal hypertrophy, moderate LVH, EF 60-65%, moderate LAE, mild RAE   . Hx of adenomatous colonic polyps   . Hyperlipidemia   . Hypertension   . Prostate cancer [facility]) 2006   Prostatectomy    Past Surgical History:  Procedure Laterality Date  . COLONOSCOPY W/ POLYPECTOMY  2003;2010;04/2014   Tubular adenoma: recall 5 yrs (after 04/2019)  . HERNIA REPAIR  07/2008   Dr.Martin  . MOHS SURGERY     left hand  . 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 RADIOFREQUENCY ABLATION  01/15/2007   for ectopic atrial foci  . SEPTOPLASTY  Age 89  . TRANSTHORACIC ECHOCARDIOGRAM  09/02/2013   Normal.  EF 60%.  LAE and RAE.   MEDS: Eliquis 5mg  bid Outpatient Medications Prior to Visit  Medication Sig Dispense Refill  . amLODipine (NORVASC) 5 MG tablet TAKE 1 TABLET BY  MOUTH  DAILY 90 tablet 1  . ELIQUIS 5 MG TABS tablet TAKE 1 TABLET BY MOUTH TWO  TIMES DAILY 180 tablet 2  . lisinopril (PRINIVIL,ZESTRIL) 40 MG tablet TAKE 1 TABLET BY MOUTH ONCE DAILY 90 tablet 2  . metoprolol tartrate (LOPRESSOR) 25 MG tablet TAKE ONE-HALF TABLET BY  MOUTH TWO TIMES DAILY 90 tablet 1  . simvastatin (ZOCOR) 20 MG tablet Take 1 tablet (20 mg total) by mouth at bedtime. 90 tablet 3   No facility-administered medications prior to visit.     Allergies  Allergen Reactions  . Levofloxacin     REACTION: Rash  . Tape Other (See Comments)    ADHESIVE TAPE; BLISTERS,    ROS As per HPI  PE: Blood pressure (!) 196/101, pulse (!) 58, temperature 98 F (36.7 C), temperature source Oral, resp. rate 16, height 6\' 2"  (1.88 m), weight 210 lb 12 oz (95.6 kg), SpO2 100 %. Gen: Alert, well appearing.  Patient is oriented to person, place, time, and situation. AFFECT: pleasant, lucid thought and speech. CV: Irreg irreg, no m/r/g. Chest is clear, no wheezing or rales. Normal symmetric air entry throughout both lung fields. No chest wall deformities or tenderness. EXT: no clubbing, cyanosis, or edema.  Foot exam -no swelling, tenderness or skin or vascular lesions. Color and temperature is  normal. Sensation is diminished on plantar surfaces bilat, L worse than R. Peripheral pulses are palpable. Toenails are normal.   LABS:    Chemistry      Component Value Date/Time   NA 134 (L) 08/16/2016 0830   K 4.3 08/16/2016 0830   CL 99 08/16/2016 0830   CO2 28 08/16/2016 0830   BUN 13 08/16/2016 0830   CREATININE 0.88 08/16/2016 0830   CREATININE 1.18 04/29/2016 1314      Component Value Date/Time   CALCIUM 9.2 08/16/2016 0830   ALKPHOS 53 08/16/2016 0830   AST 16 08/16/2016 0830   ALT 14 08/16/2016 0830   BILITOT 0.9 08/16/2016 0830     Lab Results  Component Value Date   CHOL 124 08/16/2016   HDL 39.40 08/16/2016   LDLCALC 72 08/16/2016   TRIG 65.0 08/16/2016   CHOLHDL 3  08/16/2016   Lab Results  Component Value Date   HGBA1C 6.7 (H) 08/17/2016    IMPRESSION AND PLAN:  1) DM 2, well controlled with diet. Feet exam did show some loss of protective sensation, L worse than R. Discussed general diabetic foot care. Recent A1c 6.7%--great.  2) HTN: he says today's bp here is an isolated finding, as his home bp monitoring has consistently shown bp 120s/80s.  Recent lytes/cr normal.  3) HLD: tolerating statin.  Lipids great.  4) A-fib: rate controlled, also on eliquis--doing well.  Keep up with routine cardiology f/u.  5) Hep C screening: pt wanted this test so we added it to his blood work done on 08/16/16.  An After Visit Summary was printed and given to the patient.  FOLLOW UP: Return in about 6 months (around 02/17/2017) for routine chronic illness f/u.  Signed:  Crissie Sickles, MD           08/19/2016

## 2016-08-19 NOTE — Progress Notes (Signed)
Pre visit review using our clinic review tool, if applicable. No additional management support is needed unless otherwise documented below in the visit note. 

## 2016-08-22 ENCOUNTER — Encounter: Payer: Self-pay | Admitting: *Deleted

## 2016-08-22 DIAGNOSIS — L570 Actinic keratosis: Secondary | ICD-10-CM | POA: Diagnosis not present

## 2016-09-01 ENCOUNTER — Other Ambulatory Visit: Payer: Self-pay | Admitting: *Deleted

## 2016-09-01 ENCOUNTER — Encounter: Payer: Self-pay | Admitting: Physician Assistant

## 2016-09-01 DIAGNOSIS — I10 Essential (primary) hypertension: Secondary | ICD-10-CM

## 2016-09-07 MED ORDER — SIMVASTATIN 20 MG PO TABS: 20.0000 mg | ORAL_TABLET | Freq: Every day | ORAL | 2 refills | Status: DC

## 2016-09-07 MED ORDER — APIXABAN 5 MG PO TABS: 5.0000 mg | ORAL_TABLET | Freq: Two times a day (BID) | ORAL | 2 refills | Status: DC

## 2016-09-07 MED ORDER — LISINOPRIL 40 MG PO TABS: 40.0000 mg | ORAL_TABLET | Freq: Every day | ORAL | 2 refills | Status: DC

## 2016-09-07 MED ORDER — AMLODIPINE BESYLATE 5 MG PO TABS
5.0000 mg | ORAL_TABLET | Freq: Every day | ORAL | 2 refills | Status: DC
Start: 2016-09-07 — End: 2017-05-25

## 2016-09-07 MED ORDER — METOPROLOL TARTRATE 25 MG PO TABS: ORAL_TABLET | ORAL | 2 refills | Status: DC

## 2016-10-21 DIAGNOSIS — L570 Actinic keratosis: Secondary | ICD-10-CM | POA: Diagnosis not present

## 2016-10-21 DIAGNOSIS — L820 Inflamed seborrheic keratosis: Secondary | ICD-10-CM | POA: Diagnosis not present

## 2016-10-21 DIAGNOSIS — Z85828 Personal history of other malignant neoplasm of skin: Secondary | ICD-10-CM | POA: Diagnosis not present

## 2016-10-21 DIAGNOSIS — C44722 Squamous cell carcinoma of skin of right lower limb, including hip: Secondary | ICD-10-CM | POA: Diagnosis not present

## 2016-10-21 DIAGNOSIS — C44622 Squamous cell carcinoma of skin of right upper limb, including shoulder: Secondary | ICD-10-CM | POA: Diagnosis not present

## 2016-10-21 DIAGNOSIS — C44729 Squamous cell carcinoma of skin of left lower limb, including hip: Secondary | ICD-10-CM | POA: Diagnosis not present

## 2016-10-21 DIAGNOSIS — D485 Neoplasm of uncertain behavior of skin: Secondary | ICD-10-CM | POA: Diagnosis not present

## 2016-11-10 DIAGNOSIS — Z85828 Personal history of other malignant neoplasm of skin: Secondary | ICD-10-CM | POA: Diagnosis not present

## 2016-11-10 DIAGNOSIS — C44729 Squamous cell carcinoma of skin of left lower limb, including hip: Secondary | ICD-10-CM | POA: Diagnosis not present

## 2017-02-17 ENCOUNTER — Ambulatory Visit (INDEPENDENT_AMBULATORY_CARE_PROVIDER_SITE_OTHER): Payer: Medicare Other | Admitting: Family Medicine

## 2017-02-17 ENCOUNTER — Encounter: Payer: Self-pay | Admitting: Family Medicine

## 2017-02-17 VITALS — BP 138/82 | HR 50 | Temp 98.1°F | Resp 16 | Ht 74.0 in | Wt 207.2 lb

## 2017-02-17 DIAGNOSIS — I10 Essential (primary) hypertension: Secondary | ICD-10-CM

## 2017-02-17 DIAGNOSIS — I482 Chronic atrial fibrillation, unspecified: Secondary | ICD-10-CM

## 2017-02-17 DIAGNOSIS — E119 Type 2 diabetes mellitus without complications: Secondary | ICD-10-CM

## 2017-02-17 DIAGNOSIS — E7800 Pure hypercholesterolemia, unspecified: Secondary | ICD-10-CM | POA: Diagnosis not present

## 2017-02-17 LAB — BASIC METABOLIC PANEL
BUN: 14 mg/dL (ref 6–23)
CHLORIDE: 102 meq/L (ref 96–112)
CO2: 26 mEq/L (ref 19–32)
Calcium: 9.2 mg/dL (ref 8.4–10.5)
Creatinine, Ser: 0.89 mg/dL (ref 0.40–1.50)
GFR: 89.59 mL/min (ref >60.00)
GLUCOSE: 137 mg/dL — AB (ref 70–99)
POTASSIUM: 4.2 meq/L (ref 3.5–5.1)
Sodium: 135 mEq/L (ref 135–145)

## 2017-02-17 LAB — HEMOGLOBIN A1C: Hgb A1c MFr Bld: 6.7 % — ABNORMAL HIGH (ref 4.6–6.5)

## 2017-02-17 NOTE — Progress Notes (Signed)
OFFICE VISIT  02/17/2017   CC:  Chief Complaint  Patient presents with  . Follow-up    RCI, pt is fasting.    HPI:    Patient is a 71 y.o. Caucasian male who presents for 6 mo f/u diet-controlled DM 2, HTN, HLD. Has chronic a-fib, on metoprolol and eliquis.  He denies palpitations or racing heart. All labs were stable last visit, no changes made.  Has started playing golf again.  Walking more with new dog.  DM: no home glucose monitoring.  Diabetic diet--"the best I can". HTN: Occ home monitoring at home, seems to go up with emotional stress/anger but not with physical stress. Avg 130s/80s.  HLD: takes simva daily, no side effects.  ROS: no CP, no SOB, no palpitations, LE swelling, no myalgias/arthralgias.  No melena/hematochezia. No dizziness.  Past Medical History:  Diagnosis Date  . Anemia    Early 20's  . Atrial flutter ([place]) 2008   Status post RFCA Dr Cristopher Peru  . Chronic atrial fibrillation (HCC)    Eliquis  . Diabetes mellitus without complication ([place])    Dx by A1c criteria 2014  . History of echocardiogram    Echo 12/14Severe basal septal hypertrophy, moderate LVH, EF 60-65%, moderate LAE, mild RAE   . Hx of adenomatous colonic polyps   . Hyperlipidemia   . Hypertension   . Prostate cancer [facility]) 2006   Prostatectomy    Past Surgical History:  Procedure Laterality Date  . COLONOSCOPY W/ POLYPECTOMY  2003;2010;04/2014   Tubular adenoma: recall 5 yrs (after 04/2019)  . HERNIA REPAIR  07/2008   Dr.Martin  . MOHS SURGERY     left hand  . 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 RADIOFREQUENCY ABLATION  01/15/2007   for ectopic atrial foci  . SEPTOPLASTY  Age 7  . TRANSTHORACIC ECHOCARDIOGRAM  09/02/2013   Normal.  EF 60%.  LAE and RAE.    Outpatient Medications Prior to Visit  Medication Sig Dispense Refill  . amLODipine (NORVASC) 5 MG tablet Take 1 tablet (5 mg total) by mouth daily. 90 tablet 2  . apixaban (ELIQUIS) 5  MG TABS tablet Take 1 tablet (5 mg total) by mouth 2 (two) times daily. 180 tablet 2  . lisinopril (PRINIVIL,ZESTRIL) 40 MG tablet Take 1 tablet (40 mg total) by mouth daily. 90 tablet 2  . metoprolol tartrate (LOPRESSOR) 25 MG tablet TAKE ONE-HALF TABLET BY  MOUTH TWO TIMES DAILY 90 tablet 2  . simvastatin (ZOCOR) 20 MG tablet Take 1 tablet (20 mg total) by mouth at bedtime. 90 tablet 2   No facility-administered medications prior to visit.     Allergies  Allergen Reactions  . Levofloxacin     REACTION: Rash  . Tape Other (See Comments)    ADHESIVE TAPE; BLISTERS,  . Neosporin [Neomycin-Bacitracin Zn-Polymyx] Other (See Comments)    Dry/scaly skin    ROS As per HPI  PE: Blood pressure 138/82, pulse (!) 50, temperature 98.1 F (36.7 C), temperature source Oral, resp. rate 16, height 6\' 2"  (1.88 m), weight 207 lb 4 oz (94 kg), SpO2 98 %. Body mass index is 26.61 kg/m.  Gen: Alert, well appearing.  Patient is oriented to person, place, time, and situation. AFFECT: pleasant, lucid thought and speech. CV: irreg irreg, rate 50, no m/r. Chest is clear, no wheezing or rales. Normal symmetric air entry throughout both lung fields. No chest wall deformities or tenderness. EXT; no c/c.  No  edema in R LL, just a trace of pitting in L LL.  LABS:  Lab Results  Component Value Date   TSH 0.94 07/07/2015   Lab Results  Component Value Date   WBC 5.6 04/29/2016   HGB 15.0 04/29/2016   HCT 42.4 04/29/2016   MCV 94.0 04/29/2016   PLT 177 04/29/2016   Lab Results  Component Value Date   CREATININE 0.88 08/16/2016   BUN 13 08/16/2016   NA 134 (L) 08/16/2016   K 4.3 08/16/2016   CL 99 08/16/2016   CO2 28 08/16/2016   Lab Results  Component Value Date   ALT 14 08/16/2016   AST 16 08/16/2016   ALKPHOS 53 08/16/2016   BILITOT 0.9 08/16/2016   Lab Results  Component Value Date   CHOL 124 08/16/2016   Lab Results  Component Value Date   HDL 39.40 08/16/2016   Lab Results   Component Value Date   LDLCALC 72 08/16/2016   Lab Results  Component Value Date   TRIG 65.0 08/16/2016   Lab Results  Component Value Date   CHOLHDL 3 08/16/2016   Lab Results  Component Value Date   PSA 0.00 (L) 12/03/2010   Lab Results  Component Value Date   HGBA1C 6.7 (H) 08/17/2016   IMPRESSION AND PLAN:  1) DM 2, diet controlled. Good diet/exercise habits. HbA1c today.  2) HTN: The current medical regimen is effective;  continue present plan and medications. Lytes/cr today.,  3) HLD: tolerating statin.  Lipids and AST/ALT good 08/2016. Repeat at next f/u in 6 mo.  4) Chronic a-fib, doing well on rate control and eliquis.  An After Visit Summary was printed and given to the patient.  FOLLOW UP: Return in about 6 months (around 08/19/2017) for annual CPE (fasting); AWV with Maudie Mercury same day if possible.  Signed:  Crissie Sickles, MD           02/17/2017

## 2017-02-20 ENCOUNTER — Encounter: Payer: Self-pay | Admitting: *Deleted

## 2017-05-01 ENCOUNTER — Telehealth: Payer: Self-pay | Admitting: *Deleted

## 2017-05-01 NOTE — Telephone Encounter (Signed)
Will you pls print him a rx for this vaccine?-thx

## 2017-05-01 NOTE — Telephone Encounter (Signed)
Pt came into the office today requesting Rx for Shingrix. Pt will pick up Rx. Please advise. Thanks.

## 2017-05-01 NOTE — Progress Notes (Signed)
Cardiology Office Note:    Date:  05/02/2017   ID:  Mike Day, DOB 06-09-1946, MRN [id number]  PCP:  Mike Sou, MD  Cardiologist:  Mike Dopp, PA-C / Dr. Nelva Day    Referring MD: Mike Sou, MD   Chief Complaint  Patient presents with  . Follow-up    Atrial Fibrillation    History of Present Illness:    Mike Day is a 71 y.o. male with a hx of atrial flutter status post RFCA, chronic atrial fibrillation, HTN, HL. He established with Dr. Johnsie Day 08/2013. He was placed on Eliquis given stroke risk (CHADS2-VASc=2). Last seen in 8/17.    [name] returns for Cardiology follow up.  He is here with his wife.  They will celebrate their 50th wedding anniversary next June by taking a river cruise in [place] ([place]  He is doing well.  He denies chest pain, shortness of breath, syncope, significant edema, paroxysmal nocturnal dyspnea.  He does feel tired sometimes.    Prior CV studies:   The following studies were reviewed today:  ETT 10/16  Blood pressure demonstrated a hypertensive response to exercise.  There was no ST segment deviation noted during stress.  Echo 09/02/13  Severe basal septal hypertrophy, moderate LVH, EF 60-65%, moderate LAE, mild RAE   Past Medical History:  Diagnosis Date  . Anemia    Early 20's  . Atrial flutter ([place]) 2008   Status post RFCA Dr Mike Day  . Chronic atrial fibrillation (HCC)    Eliquis  . Diabetes mellitus without complication ([place])    Dx by A1c criteria 2014  . History of echocardiogram    Echo 12/14Severe basal septal hypertrophy, moderate LVH, EF 60-65%, moderate LAE, mild RAE   . Hx of adenomatous colonic polyps   . Hyperlipidemia   . Hypertension   . Prostate cancer [facility]) 2006   Prostatectomy    Past Surgical History:  Procedure Laterality Date  . COLONOSCOPY W/ POLYPECTOMY  2003;2010;04/2014   Tubular adenoma: recall 5 yrs (after 04/2019)  . HERNIA REPAIR  07/2008   Dr.Martin  . MOHS SURGERY     left hand  . 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 RADIOFREQUENCY ABLATION  01/15/2007   for ectopic atrial foci  . SEPTOPLASTY  Age 81  . TRANSTHORACIC ECHOCARDIOGRAM  09/02/2013   Normal.  EF 60%.  LAE and RAE.    Current Medications: Current Meds  Medication Sig  . amLODipine (NORVASC) 5 MG tablet Take 1 tablet (5 mg total) by mouth daily.  [name] apixaban (ELIQUIS) 5 MG TABS tablet Take 1 tablet (5 mg total) by mouth 2 (two) times daily.  [name] lisinopril (PRINIVIL,ZESTRIL) 40 MG tablet Take 1 tablet (40 mg total) by mouth daily.  . simvastatin (ZOCOR) 20 MG tablet Take 1 tablet (20 mg total) by mouth at bedtime.  [name] Zoster Vac Recomb Adjuvanted [facility]) injection Repeat 2-6 months  . [DISCONTINUED] metoprolol tartrate (LOPRESSOR) 25 MG tablet TAKE ONE-HALF TABLET BY  MOUTH TWO TIMES DAILY     Allergies:   Levofloxacin; Tape; and Neosporin [neomycin-bacitracin zn-polymyx]   Social History   Social History  . Marital status: Married    Spouse name: N/A  . Number of children: N/A  . Years of education: N/A   Social History Main Topics  . Smoking status: Former Smoker    Quit date: 09/06/2007  . Smokeless tobacco: Never Used  Comment: smoked 1963 -2009; total 5 packs of cigarettes in life . Cigars < 1 / day on average 1992-2009  . Alcohol use 4.2 oz/week    7 Glasses of wine per week     Comment: Socially  . Drug use: No  . Sexual activity: Not Asked   Other Topics Concern  . None   Social History Narrative   Married, 1 son, 2 grand-daughters.   Educ: Masters degree   Occup: retired from [employer].   No tobacco.   Alcohol: 1 glass red wine per night.   Was once a long distance runner.     Family Hx: The patient's family history includes Diabetes in his mother; Emphysema in his father; Heart attack (age of onset: 25) in his brother; Heart disease in his father; Hypertension in his brother; Lung disease in  his father; Stroke (age of onset: 63) in his mother. There is no history of Cancer.  ROS:   Please see the history of present illness.    ROS All other systems reviewed and are negative.   EKGs/Labs/Other Test Reviewed:    EKG:  EKG is  ordered today.  The ekg ordered today demonstrates AFib, HR 44  Recent Labs: 08/16/2016: ALT 14 02/17/2017: BUN 14; Creatinine, Ser 0.89; Potassium 4.2; Sodium 135   Recent Lipid Panel Lab Results  Component Value Date/Time   CHOL 124 08/16/2016 08:30 AM   TRIG 65.0 08/16/2016 08:30 AM   HDL 39.40 08/16/2016 08:30 AM   CHOLHDL 3 08/16/2016 08:30 AM   LDLCALC 72 08/16/2016 08:30 AM    Physical Exam:    VS:  BP 110/78   Pulse (!) 44   Ht 6\' 2"  (1.88 m)   Wt 203 lb 1.9 oz (92.1 kg)   SpO2 96%   BMI 26.08 kg/m     Wt Readings from Last 3 Encounters:  05/02/17 203 lb 1.9 oz (92.1 kg)  02/17/17 207 lb 4 oz (94 kg)  08/19/16 210 lb 12 oz (95.6 kg)     Physical Exam  Constitutional: He is oriented to person, place, and time. He appears well-developed and well-nourished. No distress.  HENT:  Head: Normocephalic and atraumatic.  Eyes: No scleral icterus.  Neck: No JVD present.  Cardiovascular: Normal rate.  An irregularly irregular rhythm present.  No murmur heard. Pulmonary/Chest: Effort normal. He has no rales.  Abdominal: Soft. There is no tenderness.  Musculoskeletal: He exhibits no edema.  Neurological: He is alert and oriented to person, place, and time.  Skin: Skin is warm and dry.  Psychiatric: He has a normal mood and affect.    ASSESSMENT:    1. Permanent atrial fibrillation ([place])   2. Essential hypertension    PLAN:    In order of problems listed above:  1. Permanent atrial fibrillation (HCC)  Rate controlled. HR is now in the 40s.  He is not really symptomatic but does note fatigue sometimes.  His BP is well controlled.  -  DC Metoprolol  -  Continue Apixaban  -  Recent BMET ok.  Check CBC today.   2. Essential  hypertension The patient's blood pressure is controlled on his current regimen.  Continue current therapy.     Dispo:  Return in about 1 year (around 05/02/2018) for Routine Follow Up, w/ Mike Dopp, PA-C.   Medication Adjustments/Labs and Tests Ordered: Current medicines are reviewed at length with the patient today.  Concerns regarding medicines are outlined above.  Tests Ordered: Orders Placed  This Encounter  Procedures  . CBC  . EKG 12-Lead   Medication Changes: No orders of the defined types were placed in this encounter.   Signed, Mike Dopp, PA-C  05/02/2017 12:59 PM    [facility] [address], [place], [place]  [address] Phone: [phone]; Fax: [phone]

## 2017-05-02 ENCOUNTER — Encounter: Payer: Self-pay | Admitting: Physician Assistant

## 2017-05-02 ENCOUNTER — Ambulatory Visit (INDEPENDENT_AMBULATORY_CARE_PROVIDER_SITE_OTHER): Payer: Medicare Other | Admitting: Physician Assistant

## 2017-05-02 VITALS — BP 110/78 | HR 44 | Ht 74.0 in | Wt 203.1 lb

## 2017-05-02 DIAGNOSIS — I482 Chronic atrial fibrillation: Secondary | ICD-10-CM | POA: Diagnosis not present

## 2017-05-02 DIAGNOSIS — I10 Essential (primary) hypertension: Secondary | ICD-10-CM | POA: Diagnosis not present

## 2017-05-02 DIAGNOSIS — I4821 Permanent atrial fibrillation: Secondary | ICD-10-CM

## 2017-05-02 MED ORDER — ZOSTER VAC RECOMB ADJUVANTED 50 MCG/0.5ML IM SUSR: INTRAMUSCULAR | 1 refills | Status: DC

## 2017-05-02 NOTE — Telephone Encounter (Signed)
Rx printed

## 2017-05-02 NOTE — Telephone Encounter (Signed)
Pt and pts wife advised that Rx is ready for p/u at our front desk.

## 2017-05-02 NOTE — Patient Instructions (Signed)
Medication Instructions:  1. STOP METOPROLOL   2. CONTINUE ALL OTHER MEDICATIONS AS PRESCRIBED  Labwork: TODAY CBC  Testing/Procedures: NONE ORDERED   Follow-Up: Your physician wants you to follow-up in: [place], [facility]  You will receive a reminder letter in the mail two months in advance. If you don't receive a letter, please call our office to schedule the follow-up appointment.   Any Other Special Instructions Will Be Listed Below (If Applicable).     If you need a refill on your cardiac medications before your next appointment, please call your pharmacy.

## 2017-05-03 LAB — CBC
HEMOGLOBIN: 13.6 g/dL (ref 13.0–17.7)
Hematocrit: 40.8 % (ref 37.5–51.0)
MCH: 32.3 pg (ref 26.6–33.0)
MCHC: 33.3 g/dL (ref 31.5–35.7)
MCV: 97 fL (ref 79–97)
PLATELETS: 169 10*3/uL (ref 150–379)
RBC: 4.21 x10E6/uL (ref 4.14–5.80)
RDW: 14 % (ref 12.3–15.4)
WBC: 6.5 10*3/uL (ref 3.4–10.8)

## 2017-05-11 ENCOUNTER — Ambulatory Visit (INDEPENDENT_AMBULATORY_CARE_PROVIDER_SITE_OTHER): Payer: Medicare Other

## 2017-05-11 DIAGNOSIS — L570 Actinic keratosis: Secondary | ICD-10-CM | POA: Diagnosis not present

## 2017-05-11 DIAGNOSIS — L821 Other seborrheic keratosis: Secondary | ICD-10-CM | POA: Diagnosis not present

## 2017-05-11 DIAGNOSIS — D485 Neoplasm of uncertain behavior of skin: Secondary | ICD-10-CM | POA: Diagnosis not present

## 2017-05-11 DIAGNOSIS — Z85828 Personal history of other malignant neoplasm of skin: Secondary | ICD-10-CM | POA: Diagnosis not present

## 2017-05-11 DIAGNOSIS — Z23 Encounter for immunization: Secondary | ICD-10-CM

## 2017-05-11 DIAGNOSIS — C4441 Basal cell carcinoma of skin of scalp and neck: Secondary | ICD-10-CM | POA: Diagnosis not present

## 2017-05-11 DIAGNOSIS — L814 Other melanin hyperpigmentation: Secondary | ICD-10-CM | POA: Diagnosis not present

## 2017-05-24 ENCOUNTER — Ambulatory Visit: Payer: Medicare Other | Admitting: Family Medicine

## 2017-05-24 NOTE — Progress Notes (Deleted)
OFFICE VISIT  05/24/2017   CC: No chief complaint on file.    HPI:    Patient is a 71 y.o. Caucasian male who presents for respiratory symptoms.  Past Medical History:  Diagnosis Date  . Anemia    Early 20's  . Atrial flutter ([place]) 2008   Status post RFCA Dr Cristopher Peru  . Chronic atrial fibrillation (HCC)    Eliquis  . Diabetes mellitus without complication ([place])    Dx by A1c criteria 2014  . History of echocardiogram    Echo 12/14Severe basal septal hypertrophy, moderate LVH, EF 60-65%, moderate LAE, mild RAE   . Hx of adenomatous colonic polyps   . Hyperlipidemia   . Hypertension   . Prostate cancer [facility]) 2006   Prostatectomy    Past Surgical History:  Procedure Laterality Date  . COLONOSCOPY W/ POLYPECTOMY  2003;2010;04/2014   Tubular adenoma: recall 5 yrs (after 04/2019)  . HERNIA REPAIR  07/2008   Dr.Martin  . MOHS SURGERY     left hand  . 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 RADIOFREQUENCY ABLATION  01/15/2007   for ectopic atrial foci  . SEPTOPLASTY  Age 2  . TRANSTHORACIC ECHOCARDIOGRAM  09/02/2013   Normal.  EF 60%.  LAE and RAE.    Outpatient Medications Prior to Visit  Medication Sig Dispense Refill  . amLODipine (NORVASC) 5 MG tablet Take 1 tablet (5 mg total) by mouth daily. 90 tablet 2  . apixaban (ELIQUIS) 5 MG TABS tablet Take 1 tablet (5 mg total) by mouth 2 (two) times daily. 180 tablet 2  . lisinopril (PRINIVIL,ZESTRIL) 40 MG tablet Take 1 tablet (40 mg total) by mouth daily. 90 tablet 2  . simvastatin (ZOCOR) 20 MG tablet Take 1 tablet (20 mg total) by mouth at bedtime. 90 tablet 2  . Zoster Vac Recomb Adjuvanted [facility]) injection Repeat 2-6 months 0.5 mL 1   No facility-administered medications prior to visit.     Allergies  Allergen Reactions  . Levofloxacin     REACTION: Rash  . Tape Other (See Comments)    ADHESIVE TAPE; BLISTERS,  . Neosporin [Neomycin-Bacitracin Zn-Polymyx] Other (See Comments)     Dry/scaly skin    ROS As per HPI  PE: There were no vitals taken for this visit. ***  LABS:    Chemistry      Component Value Date/Time   NA 135 02/17/2017 0822   K 4.2 02/17/2017 0822   CL 102 02/17/2017 0822   CO2 26 02/17/2017 0822   BUN 14 02/17/2017 0822   CREATININE 0.89 02/17/2017 0822   CREATININE 1.18 04/29/2016 1314      Component Value Date/Time   CALCIUM 9.2 02/17/2017 0822   ALKPHOS 53 08/16/2016 0830   AST 16 08/16/2016 0830   ALT 14 08/16/2016 0830   BILITOT 0.9 08/16/2016 0830     GFR 02/17/17= 89 ml/min  IMPRESSION AND PLAN:  No problem-specific Assessment & Plan notes found for this encounter.   FOLLOW UP: No Follow-up on file.

## 2017-05-25 ENCOUNTER — Other Ambulatory Visit: Payer: Self-pay | Admitting: Cardiovascular Disease

## 2017-05-25 DIAGNOSIS — I10 Essential (primary) hypertension: Secondary | ICD-10-CM

## 2017-05-27 ENCOUNTER — Other Ambulatory Visit: Payer: Self-pay | Admitting: Cardiovascular Disease

## 2017-05-29 ENCOUNTER — Encounter: Payer: Self-pay | Admitting: Family Medicine

## 2017-05-29 ENCOUNTER — Ambulatory Visit (INDEPENDENT_AMBULATORY_CARE_PROVIDER_SITE_OTHER): Payer: Medicare Other | Admitting: Family Medicine

## 2017-05-29 VITALS — BP 132/79 | HR 80 | Temp 98.8°F | Resp 16 | Ht 74.0 in | Wt 198.5 lb

## 2017-05-29 DIAGNOSIS — R05 Cough: Secondary | ICD-10-CM

## 2017-05-29 DIAGNOSIS — J069 Acute upper respiratory infection, unspecified: Secondary | ICD-10-CM | POA: Diagnosis not present

## 2017-05-29 DIAGNOSIS — R059 Cough, unspecified: Secondary | ICD-10-CM

## 2017-05-29 MED ORDER — AZITHROMYCIN 250 MG PO TABS: ORAL_TABLET | ORAL | 0 refills | Status: DC

## 2017-05-29 NOTE — Progress Notes (Signed)
OFFICE VISIT  05/29/2017   CC:  Chief Complaint  Patient presents with  . URI   HPI:    Patient is a 71 y.o. Caucasian male with hx of chronic a-fib (on eliquis) who presents for respiratory symptoms. Onset 5-6 days ago, head congestion, nasal congestion, yellow/green mucous.  Slight ST initially but gone now. Mild HA.  Tm 99.2.  Signif cough which is starting to get a little better.   Trying mucinex/robitussin.  NO nasal sprays.   Of note, when he had routine cardiology f/u 04/2017, his HR was in the 40s and they decided to d/c his metoprolol.   Past Medical History:  Diagnosis Date  . Anemia    Early 20's  . Atrial flutter ([place]) 2008   Status post RFCA Dr Cristopher Peru  . Chronic atrial fibrillation (HCC)    Eliquis  . Diabetes mellitus without complication ([place])    Dx by A1c criteria 2014  . History of echocardiogram    Echo 12/14Severe basal septal hypertrophy, moderate LVH, EF 60-65%, moderate LAE, mild RAE   . Hx of adenomatous colonic polyps   . Hyperlipidemia   . Hypertension   . Prostate cancer [facility]) 2006   Prostatectomy    Past Surgical History:  Procedure Laterality Date  . COLONOSCOPY W/ POLYPECTOMY  2003;2010;04/2014   Tubular adenoma: recall 5 yrs (after 04/2019)  . HERNIA REPAIR  07/2008   Dr.Martin  . MOHS SURGERY     left hand  . 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 RADIOFREQUENCY ABLATION  01/15/2007   for ectopic atrial foci  . SEPTOPLASTY  Age 39  . TRANSTHORACIC ECHOCARDIOGRAM  09/02/2013   Normal.  EF 60%.  LAE and RAE.    Outpatient Medications Prior to Visit  Medication Sig Dispense Refill  . amLODipine (NORVASC) 5 MG tablet TAKE 1 TABLET DAILY 90 tablet 3  . ELIQUIS 5 MG TABS tablet TAKE 1 TABLET TWICE A DAY 180 tablet 2  . lisinopril (PRINIVIL,ZESTRIL) 40 MG tablet TAKE 1 TABLET DAILY 90 tablet 3  . simvastatin (ZOCOR) 20 MG tablet TAKE 1 TABLET AT BEDTIME 90 tablet 3  . Zoster Vac Recomb Adjuvanted  [facility]) injection Repeat 2-6 months (Patient not taking: Reported on 05/29/2017) 0.5 mL 1   No facility-administered medications prior to visit.     Allergies  Allergen Reactions  . Levofloxacin     REACTION: Rash  . Tape Other (See Comments)    ADHESIVE TAPE; BLISTERS,  . Neosporin [Neomycin-Bacitracin Zn-Polymyx] Other (See Comments)    Dry/scaly skin    ROS As per HPI  PE: Blood pressure 132/79, pulse 80, temperature 98.8 F (37.1 C), temperature source Oral, resp. rate 16, height 6\' 2"  (1.88 m), weight 198 lb 8 oz (90 kg), SpO2 97 %. VS: noted--normal. Gen: alert, NAD, NONTOXIC APPEARING. HEENT: eyes without injection, drainage, or swelling.  Ears: EACs clear, TMs with normal light reflex and landmarks.  Nose: Clear rhinorrhea, with some dried, crusty exudate adherent to mildly injected mucosa.  No purulent d/c.  No paranasal sinus TTP.  No facial swelling.  Throat and mouth without focal lesion.  No pharyngial swelling, erythema, or exudate.   Neck: supple, no LAD.   LUNGS: CTA bilat, nonlabored resps.   CV: RRR, no m/r/g. EXT: no c/c/e SKIN: no rash  LABS:    Chemistry      Component Value Date/Time   NA 135 02/17/2017 0822   K 4.2 02/17/2017  0822   CL 102 02/17/2017 0822   CO2 26 02/17/2017 0822   BUN 14 02/17/2017 0822   CREATININE 0.89 02/17/2017 0822   CREATININE 1.18 04/29/2016 1314      Component Value Date/Time   CALCIUM 9.2 02/17/2017 0822   ALKPHOS 53 08/16/2016 0830   AST 16 08/16/2016 0830   ALT 14 08/16/2016 0830   BILITOT 0.9 08/16/2016 0830     Lab Results  Component Value Date   HGBA1C 6.7 (H) 02/17/2017   Lab Results  Component Value Date   WBC 6.5 05/02/2017   HGB 13.6 05/02/2017   HCT 40.8 05/02/2017   MCV 97 05/02/2017   PLT 169 05/02/2017   IMPRESSION AND PLAN:  1) URI with cough. Discussed need to continue symptomatic care about 2-3 more days, and if not improving at that time then fill rx for azithromycin that I sent in  today. Get otc generic robitussin DM OR Mucinex DM and use as directed on the packaging for cough and congestion. Use otc generic saline nasal spray 2-3 times per day to irrigate/moisturize your nasal passages. Signs/symptoms to call or return for were reviewed and pt expressed understanding.  He is going on a trip to [name] [place] other areas in the [place] next week.  An After Visit Summary was printed and given to the patient.  FOLLOW UP: Return if symptoms worsen or fail to improve.  Signed:  Crissie Sickles, MD           05/29/2017

## 2017-05-29 NOTE — Telephone Encounter (Signed)
Pt last saw [name], [place] on 05/02/17, last labs 02/17/17 Creat 0.89, age 71, weight 92.1kg, based on specified criteria pt is on appropriate dosage of Eliquis 5mg  BID.  Will refill rx.

## 2017-05-29 NOTE — Patient Instructions (Signed)
Get otc generic robitussin DM OR Mucinex DM and use as directed on the packaging for cough and congestion. Use otc generic saline nasal spray 2-3 times per day to irrigate/moisturize your nasal passages.   

## 2017-08-24 ENCOUNTER — Other Ambulatory Visit: Payer: Self-pay

## 2017-08-24 ENCOUNTER — Encounter: Payer: Self-pay | Admitting: Family Medicine

## 2017-08-24 ENCOUNTER — Ambulatory Visit: Payer: Medicare Other

## 2017-08-24 ENCOUNTER — Ambulatory Visit (INDEPENDENT_AMBULATORY_CARE_PROVIDER_SITE_OTHER): Payer: Medicare Other | Admitting: Family Medicine

## 2017-08-24 VITALS — BP 130/78 | HR 58 | Temp 97.9°F | Resp 18 | Ht 74.0 in | Wt 202.4 lb

## 2017-08-24 DIAGNOSIS — L84 Corns and callosities: Secondary | ICD-10-CM

## 2017-08-24 DIAGNOSIS — I482 Chronic atrial fibrillation, unspecified: Secondary | ICD-10-CM

## 2017-08-24 DIAGNOSIS — I10 Essential (primary) hypertension: Secondary | ICD-10-CM | POA: Diagnosis not present

## 2017-08-24 DIAGNOSIS — E119 Type 2 diabetes mellitus without complications: Secondary | ICD-10-CM

## 2017-08-24 DIAGNOSIS — E7800 Pure hypercholesterolemia, unspecified: Secondary | ICD-10-CM

## 2017-08-24 DIAGNOSIS — Z0000 Encounter for general adult medical examination without abnormal findings: Secondary | ICD-10-CM

## 2017-08-24 LAB — LIPID PANEL
CHOLESTEROL: 121 mg/dL (ref 0–200)
HDL: 47.7 mg/dL (ref >39.00)
LDL Cholesterol: 62 mg/dL (ref 0–99)
NONHDL: 73.06
TRIGLYCERIDES: 54 mg/dL (ref 0.0–149.0)
Total CHOL/HDL Ratio: 3
VLDL: 10.8 mg/dL (ref 0.0–40.0)

## 2017-08-24 LAB — COMPREHENSIVE METABOLIC PANEL
ALK PHOS: 69 U/L (ref 39–117)
ALT: 13 U/L (ref 0–53)
AST: 15 U/L (ref 0–37)
Albumin: 4.3 g/dL (ref 3.5–5.2)
BILIRUBIN TOTAL: 0.8 mg/dL (ref 0.2–1.2)
BUN: 13 mg/dL (ref 6–23)
CALCIUM: 9.4 mg/dL (ref 8.4–10.5)
CO2: 30 mEq/L (ref 19–32)
Chloride: 97 mEq/L (ref 96–112)
Creatinine, Ser: 0.89 mg/dL (ref 0.40–1.50)
GFR: 89.46 mL/min (ref >60.00)
GLUCOSE: 126 mg/dL — AB (ref 70–99)
Potassium: 4.5 mEq/L (ref 3.5–5.1)
Sodium: 133 mEq/L — ABNORMAL LOW (ref 135–145)
TOTAL PROTEIN: 7.2 g/dL (ref 6.0–8.3)

## 2017-08-24 LAB — HEMOGLOBIN A1C: HEMOGLOBIN A1C: 6.7 % — AB (ref 4.6–6.5)

## 2017-08-24 NOTE — Progress Notes (Signed)
Subjective:   Mike Day is a 71 y.o. male who presents for Medicare Annual/Subsequent preventive examination.  Review of Systems:  No ROS.  Medicare Wellness Visit. Additional risk factors are reflected in the social history.  Cardiac Risk Factors include: advanced age (>65men, >30 women);dyslipidemia;male gender;hypertension;family history of premature cardiovascular disease   Sleep patterns: Sleeps 8 hours, up x 2 to void.  Home Safety/Smoke Alarms: Feels safe in home. Smoke alarms in place.  Living environment; residence and Firearm Safety: Lives with wife in 2 story home.  Seat Belt Safety/Bike Helmet: Wears seat belt.   Male:   CCS-Colonoscopy 05/05/2014, polyps. Recall 5 years.      PSA-  Lab Results  Component Value Date   PSA 0.00 (L) 12/03/2010       Objective:    Vitals: BP 130/78 (BP Location: Left Arm, Patient Position: Sitting, Cuff Size: Normal)   Pulse (!) 58   Temp 97.9 F (36.6 C) (Oral)   Resp 18   Ht 6\' 2"  (1.88 m)   Wt 202 lb 6.4 oz (91.8 kg)   SpO2 97%   BMI 25.99 kg/m   Body mass index is 25.99 kg/m.  Advanced Directives 08/24/2017 06/05/2015  Does Patient Have a Medical Advance Directive? Yes Yes  Type of Paramedic of [place];Living will -  Copy of [place] in Chart? No - copy requested Yes    Tobacco Social History   Tobacco Use  Smoking Status Former Smoker  . Last attempt to quit: 09/06/2007  . Years since quitting: 9.9  Smokeless Tobacco Never Used  Tobacco Comment   smoked 1963 -2009; total 5 packs of cigarettes in life . Cigars < 1 / day on average 1992-2009     Counseling given: Not Answered Comment: smoked 1963 -2009; total 5 packs of cigarettes in life . Cigars < 1 / day on average 1992-2009     Past Medical History:  Diagnosis Date  . Anemia    Early 20's  . Atrial flutter ([place]) 2008   Status post RFCA Dr Cristopher Peru  . Chronic atrial fibrillation (HCC)    Eliquis    . Diabetes mellitus without complication ([place])    Dx by A1c criteria 2014  . History of echocardiogram    Echo 12/14Severe basal septal hypertrophy, moderate LVH, EF 60-65%, moderate LAE, mild RAE   . Hx of adenomatous colonic polyps   . Hyperlipidemia   . Hypertension   . Prostate cancer [facility]) 2006   Prostatectomy   Past Surgical History:  Procedure Laterality Date  . COLONOSCOPY W/ POLYPECTOMY  2003;2010;04/2014   Tubular adenoma: recall 5 yrs (after 04/2019)  . HERNIA REPAIR  07/2008   Dr.Martin  . MOHS SURGERY     left hand  . 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 RADIOFREQUENCY ABLATION  01/15/2007   for ectopic atrial foci  . SEPTOPLASTY  Age 78  . TRANSTHORACIC ECHOCARDIOGRAM  09/02/2013   Normal.  EF 60%.  LAE and RAE.   Family History  Problem Relation Age of Onset  . Lung disease Father        Black Lung  . Heart disease Father        Congenital  . Emphysema Father   . Diabetes Mother   . Stroke Mother 76  . Hypertension Brother   . Heart attack Brother 8       sudden death  . Cancer  Neg Hx    Social History   Socioeconomic History  . Marital status: Married    Spouse name: None  . Number of children: None  . Years of education: None  . Highest education level: None  Social Needs  . Financial resource strain: None  . Food insecurity - worry: None  . Food insecurity - inability: None  . Transportation needs - medical: None  . Transportation needs - non-medical: None  Occupational History  . None  Tobacco Use  . Smoking status: Former Smoker    Last attempt to quit: 09/06/2007    Years since quitting: 9.9  . Smokeless tobacco: Never Used  . Tobacco comment: smoked 1963 -2009; total 5 packs of cigarettes in life . Cigars < 1 / day on average 1992-2009  Substance and Sexual Activity  . Alcohol use: Yes    Alcohol/week: 4.2 oz    Types: 7 Glasses of wine per week    Comment: Socially  . Drug use: No  . Sexual activity:  None  Other Topics Concern  . None  Social History Narrative   Married, 1 son, 2 grand-daughters.   Educ: Masters degree   Occup: retired from [employer].   No tobacco.   Alcohol: 1 glass red wine per night.   Was once a long distance runner.    Outpatient Encounter Medications as of 08/24/2017  Medication Sig  . amLODipine (NORVASC) 5 MG tablet TAKE 1 TABLET DAILY  . ELIQUIS 5 MG TABS tablet TAKE 1 TABLET TWICE A DAY  . lisinopril (PRINIVIL,ZESTRIL) 40 MG tablet TAKE 1 TABLET DAILY  . simvastatin (ZOCOR) 20 MG tablet TAKE 1 TABLET AT BEDTIME  . [DISCONTINUED] azithromycin (ZITHROMAX) 250 MG tablet 2 tabs po qd x 1d, then 1 tab po qd x 4d   No facility-administered encounter medications on file as of 08/24/2017.     Activities of Daily Living In your present state of health, do you have any difficulty performing the following activities: 08/24/2017  Hearing? N  Vision? N  Difficulty concentrating or making decisions? N  Walking or climbing stairs? N  Dressing or bathing? N  Doing errands, shopping? N  Preparing Food and eating ? N  Using the Toilet? N  In the past six months, have you accidently leaked urine? N  Do you have problems with loss of bowel control? N  Managing your Medications? N  Managing your Finances? N  Housekeeping or managing your Housekeeping? N  Some recent data might be hidden    Patient Care Team: Tammi Sou, MD as PCP - General (Family Medicine) Lyla Glassing, Aaron Edelman, MD as Consulting Physician (Orthopedic Surgery) Sharmon Revere as Physician Assistant (Cardiology) Rolm Bookbinder, MD as Consulting Physician (Dermatology)   Assessment:   This is a routine wellness examination for Mike Day.  Exercise Activities and Dietary recommendations Current Exercise Habits: Home exercise routine, Type of exercise: walking, Frequency (Times/Week): 4, Exercise limited by: None identified   Diet (meal preparation, eat out, water intake, caffeinated  beverages, dairy products, fruits and vegetables): Drinks water, OJ and beer/wine.   Eats fresh fruits/veggies, fish.       Goals    . Exercise 150 minutes per week (moderate activity)     Goal to marathon;     . Patient Stated     Maintain current health by staying active.        Fall Risk Fall Risk  08/24/2017 08/12/2016 06/05/2015 07/02/2013  Falls in the past year?  Yes No Yes No  Comment - Emmi Telephone Survey: data to providers prior to load - -  Number falls in past yr: 1 - - -  Injury with Fall? No - No -  Risk for fall due to : - - History of fall(s) -  Follow up Falls prevention discussed - - -    Depression Screen PHQ 2/9 Scores 08/24/2017 06/05/2015 07/02/2013  PHQ - 2 Score 0 0 0    Cognitive Function       Ad8 score reviewed for issues:  Issues making decisions: no  Less interest in hobbies / activities: no  Repeats questions, stories (family complaining): no  Trouble using ordinary gadgets (microwave, computer, phone): no  Forgets the month or year: no  Mismanaging finances: no  Remembering appts: no  Daily problems with thinking and/or memory: no Ad8 score is=0     Immunization History  Administered Date(s) Administered  . Hep A / Hep B 08/04/2009  . Influenza Split 06/15/2011, 06/05/2012  . Influenza Whole 07/10/2007, 06/12/2008, 06/05/2009, 05/19/2010  . Influenza, High Dose Seasonal PF 06/01/2016, 05/11/2017  . Influenza,inj,Quad PF,6+ Mos 05/29/2013, 05/06/2014, 05/14/2015  . Pneumococcal Conjugate-13 07/03/2014, 07/07/2015  . Pneumococcal Polysaccharide-23 06/08/2012  . Td 09/06/1999  . Tdap 12/06/2010  . Zoster 09/07/2009      Screening Tests Health Maintenance  Topic Date Due  . OPHTHALMOLOGY EXAM  03/05/2017  . HEMOGLOBIN A1C  08/19/2017  . FOOT EXAM  08/24/2018  . COLONOSCOPY  05/06/2019  . TETANUS/TDAP  12/05/2020  . INFLUENZA VACCINE  Completed  . Hepatitis C Screening  Completed  . PNA vac Low Risk Adult   Completed   Diabetic Foot Exam - Simple   Simple Foot Form Diabetic Foot exam was performed with the following findings:  Yes 08/24/2017 10:36 AM  Visual Inspection See comments:  Yes Sensation Testing See comments:  Yes Pulse Check Posterior Tibialis and Dorsalis pulse intact bilaterally:  Yes Comments Right great toe with discoloration.          Plan:     Schedule eye exam.   Continue doing brain stimulating activities (puzzles, reading, adult coloring books, staying active) to keep memory sharp.   Bring a copy of your living will and/or healthcare power of attorney to your next office visit.  I have personally reviewed and noted the following in the patient's chart:   . Medical and social history . Use of alcohol, tobacco or illicit drugs  . Current medications and supplements . Functional ability and status . Nutritional status . Physical activity . Advanced directives . List of other physicians . Hospitalizations, surgeries, and ER visits in previous 12 months . Vitals . Screenings to include cognitive, depression, and falls . Referrals and appointments  In addition, I have reviewed and discussed with patient certain preventive protocols, quality metrics, and best practice recommendations. A written personalized care plan for preventive services as well as general preventive health recommendations were provided to patient.     Gerilyn Nestle, RN  08/24/2017

## 2017-08-24 NOTE — Progress Notes (Signed)
OFFICE VISIT  08/24/2017   CC:  Chief Complaint  Patient presents with  . Medicare Wellness  Also RCI  HPI:    Patient is a 71 y.o. Caucasian male who presents for f/u diet controlled DM 2, HTN, HLD. Has chronic a fib and is on eliquis---followed by cardiology. No palpitations, racing heart, dizziness, CP, or SOB. No melena, hematochezia, hematuria, nose bleeds, or unusual bruising.  DM: no polyuria or polydipsia.  No home glucose monitoring. Feet: no pain, tingling, or numbness.  No claudication symptoms. R great toe--tip has callus with bluish/black discoloration for the last 5-6 mo.  No pain or discomfort. No trauma recalled.  He does peel some of the surface when he can, also uses an abrasive to "sand" the area. Says the lesion has been gradually improving/resolving.  HTN: Home bp monitoring fairly regularly--avg low 130s over 70-80.  HLD: taking simva qd, no side effects.   Past Medical History:  Diagnosis Date  . Anemia    Early 20's  . Atrial flutter ([place]) 2008   Status post [facility] Dr Cristopher Peru  . Chronic atrial fibrillation (HCC)    Eliquis  . Diabetes mellitus without complication ([place])    Dx by A1c criteria 2014  . History of echocardiogram    Echo 12/14Severe basal septal hypertrophy, moderate LVH, EF 60-65%, moderate LAE, mild RAE   . Hx of adenomatous colonic polyps   . Hyperlipidemia   . Hypertension   . Prostate cancer [facility]) 2006   Prostatectomy    Past Surgical History:  Procedure Laterality Date  . COLONOSCOPY W/ POLYPECTOMY  2003;2010;04/2014   Tubular adenoma: recall 5 yrs (after 04/2019)  . HERNIA REPAIR  07/2008   Dr.Martin  . MOHS SURGERY     left hand  . 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 RADIOFREQUENCY ABLATION  01/15/2007   for ectopic atrial foci  . SEPTOPLASTY  Age 85  . TRANSTHORACIC ECHOCARDIOGRAM  09/02/2013   Normal.  EF 60%.  LAE and RAE.    Outpatient Medications Prior to Visit  Medication  Sig Dispense Refill  . amLODipine (NORVASC) 5 MG tablet TAKE 1 TABLET DAILY 90 tablet 3  . ELIQUIS 5 MG TABS tablet TAKE 1 TABLET TWICE A DAY 180 tablet 2  . lisinopril (PRINIVIL,ZESTRIL) 40 MG tablet TAKE 1 TABLET DAILY 90 tablet 3  . simvastatin (ZOCOR) 20 MG tablet TAKE 1 TABLET AT BEDTIME 90 tablet 3  . azithromycin (ZITHROMAX) 250 MG tablet 2 tabs po qd x 1d, then 1 tab po qd x 4d 6 tablet 0   No facility-administered medications prior to visit.     Allergies  Allergen Reactions  . Levofloxacin     REACTION: Rash  . Tape Other (See Comments)    ADHESIVE TAPE; BLISTERS,  . Neosporin [Neomycin-Bacitracin Zn-Polymyx] Other (See Comments)    Dry/scaly skin    ROS As per HPI  PE: Blood pressure 130/78, pulse (!) 58, temperature 97.9 F (36.6 C), temperature source Oral, resp. rate 18, height 6\' 2"  (1.88 m), weight 202 lb 6.4 oz (91.8 kg), SpO2 97 %. Gen: Alert, well appearing.  Patient is oriented to person, place, time, and situation. AFFECT: pleasant, lucid thought and speech. CV: irreg irreg, rate around 60/min, no m/r/g. Chest is clear, no wheezing or rales. Normal symmetric air entry throughout both lung fields. No chest wall deformities or tenderness. EXT: no clubbing, cyanosis, or edema.  R foot: tip of great toe  with callus with speckled violacious/dark brown discoloration.  No tenderness, fluctuance, or erythema. He has normal sensation in the entire foot. PT pulse 1+, DP 1+. Feet warm.  LABS:  Lab Results  Component Value Date   TSH 0.94 07/07/2015   Lab Results  Component Value Date   WBC 6.5 05/02/2017   HGB 13.6 05/02/2017   HCT 40.8 05/02/2017   MCV 97 05/02/2017   PLT 169 05/02/2017   Lab Results  Component Value Date   CREATININE 0.89 02/17/2017   BUN 14 02/17/2017   NA 135 02/17/2017   K 4.2 02/17/2017   CL 102 02/17/2017   CO2 26 02/17/2017   Lab Results  Component Value Date   ALT 14 08/16/2016   AST 16 08/16/2016   ALKPHOS 53  08/16/2016   BILITOT 0.9 08/16/2016   Lab Results  Component Value Date   CHOL 124 08/16/2016   Lab Results  Component Value Date   HDL 39.40 08/16/2016   Lab Results  Component Value Date   LDLCALC 72 08/16/2016   Lab Results  Component Value Date   TRIG 65.0 08/16/2016   Lab Results  Component Value Date   CHOLHDL 3 08/16/2016   Lab Results  Component Value Date   PSA 0.00 (L) 12/03/2010   Lab Results  Component Value Date   HGBA1C 6.7 (H) 02/17/2017    IMPRESSION AND PLAN:  1) DM 2--diet controlled. HbA1c today. Pt to schedule diab retpthy eye exam.  2) HTN: The current medical regimen is effective;  continue present plan and medications. Lytes/cr today.  3) HLD: tolerating statin.  Check FLP and hepatic panel today.  4) Chronic a-fib, on eliquis.  Doing well on no rate control med or antiarrhythmic med. Keep routine f/u with cardiologist.  5) Right great toe callus: some discoloration at the surface--? Bruise from being on eliquis. Low suspicion of pre-ulcerative stage at this time.  No sign of vascular insufficiency. He'll soak the toe in warm epsom salt water for 20 min qd-qod, and gently abrade the lesion with sanding stone. Signs/symptoms to call or return for were reviewed and pt expressed understanding.  An After Visit Summary was printed and given to the patient.  FOLLOW UP: Return in about 6 months (around 02/22/2018) for routine chronic illness f/u.  Signed:  Crissie Sickles, MD           08/24/2017

## 2017-08-24 NOTE — Patient Instructions (Addendum)
Schedule eye exam.   Continue doing brain stimulating activities (puzzles, reading, adult coloring books, staying active) to keep memory sharp.   Bring a copy of your living will and/or healthcare power of attorney to your next office visit.   Health Maintenance, Male A healthy lifestyle and preventive care is important for your health and wellness. Ask your health care provider about what schedule of regular examinations is right for you. What should I know about weight and diet? Eat a Healthy Diet  Eat plenty of vegetables, fruits, whole grains, low-fat dairy products, and lean protein.  Do not eat a lot of foods high in solid fats, added sugars, or salt.  Maintain a Healthy Weight Regular exercise can help you achieve or maintain a healthy weight. You should:  Do at least 150 minutes of exercise each week. The exercise should increase your heart rate and make you sweat (moderate-intensity exercise).  Do strength-training exercises at least twice a week.  Watch Your Levels of Cholesterol and Blood Lipids  Have your blood tested for lipids and cholesterol every 5 years starting at 71 years of age. If you are at high risk for heart disease, you should start having your blood tested when you are 71 years old. You may need to have your cholesterol levels checked more often if: ? Your lipid or cholesterol levels are high. ? You are older than 71 years of age. ? You are at high risk for heart disease.  What should I know about cancer screening? Many types of cancers can be detected early and may often be prevented. Lung Cancer  You should be screened every year for lung cancer if: ? You are a current smoker who has smoked for at least 30 years. ? You are a former smoker who has quit within the past 15 years.  Talk to your health care provider about your screening options, when you should start screening, and how often you should be screened.  Colorectal Cancer  Routine  colorectal cancer screening usually begins at 71 years of age and should be repeated every 5-10 years until you are 71 years old. You may need to be screened more often if early forms of precancerous polyps or small growths are found. Your health care provider may recommend screening at an earlier age if you have risk factors for colon cancer.  Your health care provider may recommend using home test kits to check for hidden blood in the stool.  A small camera at the end of a tube can be used to examine your colon (sigmoidoscopy or colonoscopy). This checks for the earliest forms of colorectal cancer.  Prostate and Testicular Cancer  Depending on your age and overall health, your health care provider may do certain tests to screen for prostate and testicular cancer.  Talk to your health care provider about any symptoms or concerns you have about testicular or prostate cancer.  Skin Cancer  Check your skin from head to toe regularly.  Tell your health care provider about any new moles or changes in moles, especially if: ? There is a change in a mole's size, shape, or color. ? You have a mole that is larger than a pencil eraser.  Always use sunscreen. Apply sunscreen liberally and repeat throughout the day.  Protect yourself by wearing long sleeves, pants, a wide-brimmed hat, and sunglasses when outside.  What should I know about heart disease, diabetes, and high blood pressure?  If you are 18-39 years of age, have   your blood pressure checked every 3-5 years. If you are 40 years of age or older, have your blood pressure checked every year. You should have your blood pressure measured twice-once when you are at a hospital or clinic, and once when you are not at a hospital or clinic. Record the average of the two measurements. To check your blood pressure when you are not at a hospital or clinic, you can use: ? An automated blood pressure machine at a pharmacy. ? A home blood pressure  monitor.  Talk to your health care provider about your target blood pressure.  If you are between 45-79 years old, ask your health care provider if you should take aspirin to prevent heart disease.  Have regular diabetes screenings by checking your fasting blood sugar level. ? If you are at a normal weight and have a low risk for diabetes, have this test once every three years after the age of 45. ? If you are overweight and have a high risk for diabetes, consider being tested at a younger age or more often.  A one-time screening for abdominal aortic aneurysm (AAA) by ultrasound is recommended for men aged 65-75 years who are current or former smokers. What should I know about preventing infection? Hepatitis B If you have a higher risk for hepatitis B, you should be screened for this virus. Talk with your health care provider to find out if you are at risk for hepatitis B infection. Hepatitis C Blood testing is recommended for:  Everyone born from 1945 through 1965.  Anyone with known risk factors for hepatitis C.  Sexually Transmitted Diseases (STDs)  You should be screened each year for STDs including gonorrhea and chlamydia if: ? You are sexually active and are younger than 71 years of age. ? You are older than 71 years of age and your health care provider tells you that you are at risk for this type of infection. ? Your sexual activity has changed since you were last screened and you are at an increased risk for chlamydia or gonorrhea. Ask your health care provider if you are at risk.  Talk with your health care provider about whether you are at high risk of being infected with HIV. Your health care provider may recommend a prescription medicine to help prevent HIV infection.  What else can I do?  Schedule regular health, dental, and eye exams.  Stay current with your vaccines (immunizations).  Do not use any tobacco products, such as cigarettes, chewing tobacco, and  e-cigarettes. If you need help quitting, ask your health care provider.  Limit alcohol intake to no more than 2 drinks per day. One drink equals 12 ounces of beer, 5 ounces of wine, or 1 ounces of hard liquor.  Do not use street drugs.  Do not share needles.  Ask your health care provider for help if you need support or information about quitting drugs.  Tell your health care provider if you often feel depressed.  Tell your health care provider if you have ever been abused or do not feel safe at home. This information is not intended to replace advice given to you by your health care provider. Make sure you discuss any questions you have with your health care provider. Document Released: 02/18/2008 Document Revised: 04/20/2016 Document Reviewed: 05/26/2015 Elsevier Interactive Patient Education  2018 Elsevier Inc.  

## 2017-08-25 ENCOUNTER — Encounter: Payer: Medicare Other | Admitting: Family Medicine

## 2017-08-25 DIAGNOSIS — Z8546 Personal history of malignant neoplasm of prostate: Secondary | ICD-10-CM | POA: Diagnosis not present

## 2017-08-25 DIAGNOSIS — N529 Male erectile dysfunction, unspecified: Secondary | ICD-10-CM | POA: Diagnosis not present

## 2017-08-25 DIAGNOSIS — C61 Malignant neoplasm of prostate: Secondary | ICD-10-CM | POA: Diagnosis not present

## 2017-08-25 DIAGNOSIS — I4891 Unspecified atrial fibrillation: Secondary | ICD-10-CM | POA: Diagnosis not present

## 2017-08-27 NOTE — Progress Notes (Signed)
AWV reviewed and agree.  Signed:  Phil Kardell Virgil, MD           08/27/2017  

## 2017-09-08 DIAGNOSIS — D485 Neoplasm of uncertain behavior of skin: Secondary | ICD-10-CM | POA: Diagnosis not present

## 2017-09-08 DIAGNOSIS — Z85828 Personal history of other malignant neoplasm of skin: Secondary | ICD-10-CM | POA: Diagnosis not present

## 2017-09-08 DIAGNOSIS — D0439 Carcinoma in situ of skin of other parts of face: Secondary | ICD-10-CM | POA: Diagnosis not present

## 2017-09-08 DIAGNOSIS — C44722 Squamous cell carcinoma of skin of right lower limb, including hip: Secondary | ICD-10-CM | POA: Diagnosis not present

## 2017-11-24 ENCOUNTER — Other Ambulatory Visit: Payer: Self-pay

## 2017-11-24 MED ORDER — APIXABAN 5 MG PO TABS: 5.0000 mg | ORAL_TABLET | Freq: Two times a day (BID) | ORAL | 2 refills | Status: DC

## 2017-11-24 NOTE — Telephone Encounter (Signed)
Eliquis 5mg  refill request received; pt is 72 yrs old, wt- 91.8kg, Crea-0.89 on 08/24/17, last seen by [name] on 05/02/17; will send in refill to requested pharmacy.

## 2018-01-03 DIAGNOSIS — L570 Actinic keratosis: Secondary | ICD-10-CM | POA: Diagnosis not present

## 2018-01-03 DIAGNOSIS — Z85828 Personal history of other malignant neoplasm of skin: Secondary | ICD-10-CM | POA: Diagnosis not present

## 2018-02-06 ENCOUNTER — Ambulatory Visit (INDEPENDENT_AMBULATORY_CARE_PROVIDER_SITE_OTHER): Payer: Medicare Other | Admitting: Family Medicine

## 2018-02-06 ENCOUNTER — Encounter: Payer: Self-pay | Admitting: Family Medicine

## 2018-02-06 VITALS — BP 132/76 | HR 60 | Temp 98.2°F | Resp 16 | Ht 74.0 in | Wt 201.4 lb

## 2018-02-06 DIAGNOSIS — I10 Essential (primary) hypertension: Secondary | ICD-10-CM

## 2018-02-06 DIAGNOSIS — I482 Chronic atrial fibrillation, unspecified: Secondary | ICD-10-CM

## 2018-02-06 DIAGNOSIS — E119 Type 2 diabetes mellitus without complications: Secondary | ICD-10-CM

## 2018-02-06 DIAGNOSIS — E663 Overweight: Secondary | ICD-10-CM | POA: Diagnosis not present

## 2018-02-06 DIAGNOSIS — E7800 Pure hypercholesterolemia, unspecified: Secondary | ICD-10-CM

## 2018-02-06 LAB — LIPID PANEL
CHOLESTEROL: 119 mg/dL (ref 0–200)
HDL: 43.3 mg/dL (ref >39.00)
LDL CALC: 65 mg/dL (ref 0–99)
NonHDL: 75.61
Total CHOL/HDL Ratio: 3
Triglycerides: 55 mg/dL (ref 0.0–149.0)
VLDL: 11 mg/dL (ref 0.0–40.0)

## 2018-02-06 LAB — BASIC METABOLIC PANEL
BUN: 12 mg/dL (ref 6–23)
CO2: 30 meq/L (ref 19–32)
Calcium: 9.7 mg/dL (ref 8.4–10.5)
Chloride: 99 mEq/L (ref 96–112)
Creatinine, Ser: 0.89 mg/dL (ref 0.40–1.50)
GFR: 89.35 mL/min (ref >60.00)
GLUCOSE: 131 mg/dL — AB (ref 70–99)
POTASSIUM: 4.9 meq/L (ref 3.5–5.1)
SODIUM: 135 meq/L (ref 135–145)

## 2018-02-06 LAB — HEMOGLOBIN A1C: HEMOGLOBIN A1C: 6.9 % — AB (ref 4.6–6.5)

## 2018-02-06 NOTE — Progress Notes (Signed)
OFFICE VISIT  02/06/2018   CC:  Chief Complaint  Patient presents with  . Follow-up    RCI, pt is fasting.    HPI:    Patient is a 72 y.o. Caucasian male who presents for 6 mo f/u HTN, HLD, DM 2.  DM 2: diet controlled, no meds.  No polyuria/polydipsia.  Good diet. No home gluc monitoring.   HTN: home bp monitoring shows consistently <130/80.  HLD: tolerating statin. Eats low fat/low chol diet.  ROS: no melena, no palpitations or dizziness or racing heart.  No HAs, no CP, no sOB, no myalgias.  Past Medical History:  Diagnosis Date  . Anemia    Early 20's  . Atrial flutter ([place]) 2008   Status post [facility] Dr Cristopher Peru  . Chronic atrial fibrillation (HCC)    Eliquis  . Diabetes mellitus without complication ([place])    Dx by A1c criteria 2014  . History of echocardiogram    Echo 12/14Severe basal septal hypertrophy, moderate LVH, EF 60-65%, moderate LAE, mild RAE   . Hx of adenomatous colonic polyps   . Hyperlipidemia   . Hypertension   . Prostate cancer [facility]) 2006   Prostatectomy    Past Surgical History:  Procedure Laterality Date  . COLONOSCOPY W/ POLYPECTOMY  2003;2010;04/2014   Tubular adenoma: recall 5 yrs (after 04/2019)  . HERNIA REPAIR  07/2008   Dr.Martin  . MOHS SURGERY     left hand  . 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 RADIOFREQUENCY ABLATION  01/15/2007   for ectopic atrial foci  . SEPTOPLASTY  Age 17  . TRANSTHORACIC ECHOCARDIOGRAM  09/02/2013   Normal.  EF 60%.  LAE and RAE.    Outpatient Medications Prior to Visit  Medication Sig Dispense Refill  . amLODipine (NORVASC) 5 MG tablet TAKE 1 TABLET DAILY 90 tablet 3  . apixaban (ELIQUIS) 5 MG TABS tablet Take 1 tablet (5 mg total) by mouth 2 (two) times daily. 180 tablet 2  . lisinopril (PRINIVIL,ZESTRIL) 40 MG tablet TAKE 1 TABLET DAILY 90 tablet 3  . simvastatin (ZOCOR) 20 MG tablet TAKE 1 TABLET AT BEDTIME 90 tablet 3   No facility-administered medications prior  to visit.     Allergies  Allergen Reactions  . Levofloxacin     REACTION: Rash  . Tape Other (See Comments)    ADHESIVE TAPE; BLISTERS,  . Neosporin [Neomycin-Bacitracin Zn-Polymyx] Other (See Comments)    Dry/scaly skin    ROS As per HPI  PE: Blood pressure 132/76, pulse 60, temperature 98.2 F (36.8 C), temperature source Oral, resp. rate 16, height 6\' 2"  (1.88 m), weight 201 lb 6 oz (91.3 kg), SpO2 99 %. Gen: Alert, well appearing.  Patient is oriented to person, place, time, and situation. AFFECT: pleasant, lucid thought and speech. CV: irreg irreg, no m/r/g. Chest is clear, no wheezing or rales. Normal symmetric air entry throughout both lung fields. No chest wall deformities or tenderness. EXT: no clubbing, cyanosis, or edema.    LABS:  Lab Results  Component Value Date   TSH 0.94 07/07/2015   Lab Results  Component Value Date   WBC 6.5 05/02/2017   HGB 13.6 05/02/2017   HCT 40.8 05/02/2017   MCV 97 05/02/2017   PLT 169 05/02/2017   Lab Results  Component Value Date   CREATININE 0.89 08/24/2017   BUN 13 08/24/2017   NA 133 (L) 08/24/2017   K 4.5 08/24/2017   CL 97  08/24/2017   CO2 30 08/24/2017   Lab Results  Component Value Date   ALT 13 08/24/2017   AST 15 08/24/2017   ALKPHOS 69 08/24/2017   BILITOT 0.8 08/24/2017   Lab Results  Component Value Date   CHOL 121 08/24/2017   Lab Results  Component Value Date   HDL 47.70 08/24/2017   Lab Results  Component Value Date   LDLCALC 62 08/24/2017   Lab Results  Component Value Date   TRIG 54.0 08/24/2017   Lab Results  Component Value Date   CHOLHDL 3 08/24/2017   Lab Results  Component Value Date   PSA 0.00 (L) 12/03/2010   Lab Results  Component Value Date   HGBA1C 6.7 (H) 08/24/2017    IMPRESSION AND PLAN:  1) DM 2, diet controlled. Hba1c today. He'll be arranging eye exam.  2) HTN:  The current medical regimen is effective;  continue present plan and medications. Lytes/cr  today.  3) Hypercholesterolemia: tolerating statin.  FLP today.  4) A-fib: doing well on no rate control or antiarrhythmic med.  Continue eliquis and appropriate cardiolgy f/u.  He is going on a [place]!  An After Visit Summary was printed and given to the patient.  FOLLOW UP: Return in about 3 months (around 05/09/2018) for routine chronic illness f/u.  Signed:  Crissie Sickles, MD           02/06/2018

## 2018-02-07 ENCOUNTER — Encounter: Payer: Self-pay | Admitting: *Deleted

## 2018-02-15 ENCOUNTER — Other Ambulatory Visit: Payer: Self-pay | Admitting: Cardiovascular Disease

## 2018-02-15 NOTE — Telephone Encounter (Signed)
Eliquis 5mg  refill request received; pt is 72 yrs old, wt-91.3kg, Crea-0.89 on 02/06/18, last seen by [name] om 05/02/17; will send in refill to requested pharmacy.

## 2018-03-26 DIAGNOSIS — H43811 Vitreous degeneration, right eye: Secondary | ICD-10-CM | POA: Diagnosis not present

## 2018-03-26 DIAGNOSIS — H35033 Hypertensive retinopathy, bilateral: Secondary | ICD-10-CM | POA: Diagnosis not present

## 2018-03-26 DIAGNOSIS — H25813 Combined forms of age-related cataract, bilateral: Secondary | ICD-10-CM | POA: Diagnosis not present

## 2018-04-23 ENCOUNTER — Telehealth: Payer: Self-pay | Admitting: Family Medicine

## 2018-04-23 NOTE — Telephone Encounter (Signed)
Noted.  Sorry you had to deal with this.

## 2018-04-23 NOTE — Telephone Encounter (Signed)
[facility] pt and reviewed Dr. Isla Pence note. Pt had several questions (his wife was also in the back ground asking questions). I did my best to answer his questions but after awhile I felt I was not providing him with the answer he was looking for so I offered to schedule an apt for him to come in and discuss his concerns with Dr. Anitra Lauth. Pt declined apt. Pt continued to ask questions like, when was I Dx (I reviewed Dx history looks like he was Dx in 2014 by A1c), why was I never told by Dr. Anitra Lauth or Dr. Linna Darner (I could not provided an answer for this question), why do I now have to follow up every 3 months (I advised pt that it is standard of care for [place] to follow up with our Diabetic pts every 3 months.) As mentioned earlier I did my best to answer pts question and he was not satisfied with the answers I provided him. He stated that he will just find another provider to handle his health care and hung up.

## 2018-04-23 NOTE — Telephone Encounter (Signed)
Pls inform pt that I reviewed his chart again and he DOES have diabetes. His fasting sugars are above the cutoff for dx of diabetes (126 is cutoff) and so are his HbA1c's (>6.5%). He is controlling his diabetes with diet, which is great, but he still technically has diabetes. Also, f/u in December is fine.-thx

## 2018-04-23 NOTE — Telephone Encounter (Signed)
Please advise. Thanks.  

## 2018-04-23 NOTE — Telephone Encounter (Unsigned)
Copied from [place] [phone]. Topic: Quick Communication - See Telephone Encounter >> Apr 23, 2018 10:46 AM Neva Seat wrote: Pt is asking why he is showing a diabetic when he doesn't have diabetic numbers. Pt also wants to know why he needs to come back in 3 months.  Wants to come back in 6 months in Dec.

## 2018-05-08 ENCOUNTER — Ambulatory Visit: Payer: Medicare Other | Admitting: Family Medicine

## 2018-05-11 ENCOUNTER — Other Ambulatory Visit: Payer: Self-pay | Admitting: *Deleted

## 2018-05-11 MED ORDER — APIXABAN 5 MG PO TABS: 5.0000 mg | ORAL_TABLET | Freq: Two times a day (BID) | ORAL | 2 refills | Status: DC

## 2018-05-11 NOTE — Telephone Encounter (Signed)
Pt is a 72 yr old male. Last weight was 91.3Kg on 02/06/18 and SCr was 0.89 same day. Pt has a pending appt on 06/08/18 with S. Kathlen Mody. PA for f/u.

## 2018-05-15 ENCOUNTER — Encounter: Payer: Self-pay | Admitting: Family Medicine

## 2018-05-15 ENCOUNTER — Other Ambulatory Visit: Payer: Self-pay | Admitting: Internal Medicine

## 2018-05-15 ENCOUNTER — Ambulatory Visit: Payer: Self-pay | Admitting: Family Medicine

## 2018-05-15 ENCOUNTER — Ambulatory Visit (INDEPENDENT_AMBULATORY_CARE_PROVIDER_SITE_OTHER): Payer: Medicare Other | Admitting: Family Medicine

## 2018-05-15 VITALS — BP 135/77 | HR 76 | Temp 97.9°F | Resp 20 | Ht 74.0 in | Wt 200.0 lb

## 2018-05-15 DIAGNOSIS — I4821 Permanent atrial fibrillation: Secondary | ICD-10-CM

## 2018-05-15 DIAGNOSIS — R42 Dizziness and giddiness: Secondary | ICD-10-CM

## 2018-05-15 DIAGNOSIS — I10 Essential (primary) hypertension: Secondary | ICD-10-CM

## 2018-05-15 DIAGNOSIS — I482 Chronic atrial fibrillation: Secondary | ICD-10-CM

## 2018-05-15 LAB — COMPREHENSIVE METABOLIC PANEL
ALT: 14 U/L (ref 0–53)
AST: 13 U/L (ref 0–37)
Albumin: 4.4 g/dL (ref 3.5–5.2)
Alkaline Phosphatase: 54 U/L (ref 39–117)
BUN: 14 mg/dL (ref 6–23)
CALCIUM: 10.1 mg/dL (ref 8.4–10.5)
CO2: 31 meq/L (ref 19–32)
CREATININE: 1 mg/dL (ref 0.40–1.50)
Chloride: 97 mEq/L (ref 96–112)
GFR: 78.05 mL/min (ref >60.00)
Glucose, Bld: 97 mg/dL (ref 70–99)
Potassium: 4.1 mEq/L (ref 3.5–5.1)
Sodium: 135 mEq/L (ref 135–145)
Total Bilirubin: 1 mg/dL (ref 0.2–1.2)
Total Protein: 7.3 g/dL (ref 6.0–8.3)

## 2018-05-15 LAB — CBC WITH DIFFERENTIAL/PLATELET
Basophils Absolute: 0 10*3/uL (ref 0.0–0.1)
Basophils Relative: 0.3 % (ref 0.0–3.0)
EOS ABS: 0.1 10*3/uL (ref 0.0–0.7)
Eosinophils Relative: 1.1 % (ref 0.0–5.0)
HCT: 45.3 % (ref 39.0–52.0)
HEMOGLOBIN: 15.9 g/dL (ref 13.0–17.0)
Lymphocytes Relative: 17.6 % (ref 12.0–46.0)
Lymphs Abs: 1.2 10*3/uL (ref 0.7–4.0)
MCHC: 35 g/dL (ref 30.0–36.0)
MCV: 96.8 fl (ref 78.0–100.0)
MONO ABS: 0.6 10*3/uL (ref 0.1–1.0)
Monocytes Relative: 8.4 % (ref 3.0–12.0)
Neutro Abs: 5.1 10*3/uL (ref 1.4–7.7)
Neutrophils Relative %: 72.6 % (ref 43.0–77.0)
Platelets: 207 10*3/uL (ref 150.0–400.0)
RBC: 4.68 Mil/uL (ref 4.22–5.81)
RDW: 13.1 % (ref 11.5–15.5)
WBC: 7 10*3/uL (ref 4.0–10.5)

## 2018-05-15 LAB — TSH: TSH: 1.66 u[IU]/mL (ref 0.35–4.50)

## 2018-05-15 MED ORDER — MECLIZINE HCL 25 MG PO TABS: 25.0000 mg | ORAL_TABLET | Freq: Three times a day (TID) | ORAL | 0 refills | Status: DC | PRN

## 2018-05-15 MED ORDER — ONDANSETRON HCL 4 MG PO TABS: 4.0000 mg | ORAL_TABLET | Freq: Three times a day (TID) | ORAL | 1 refills | Status: DC | PRN

## 2018-05-15 MED ORDER — LISINOPRIL 40 MG PO TABS: 40.0000 mg | ORAL_TABLET | Freq: Every day | ORAL | 0 refills | Status: DC

## 2018-05-15 MED ORDER — AMLODIPINE BESYLATE 5 MG PO TABS: 5.0000 mg | ORAL_TABLET | Freq: Every day | ORAL | 0 refills | Status: DC

## 2018-05-15 MED ORDER — SIMVASTATIN 20 MG PO TABS: 20.0000 mg | ORAL_TABLET | Freq: Every day | ORAL | 0 refills | Status: DC

## 2018-05-15 MED ORDER — APIXABAN 5 MG PO TABS: 5.0000 mg | ORAL_TABLET | Freq: Two times a day (BID) | ORAL | 2 refills | Status: DC

## 2018-05-15 NOTE — Patient Instructions (Signed)
Start meclizine today, can take every 8 hours if needed.  zofran for nausea.  HYDRATE.  I believe this is vertigo, but can not rule out other neurological causes in an OV. If worsening please go directly to the ED, so they can emergently scan your head.    Vertigo Vertigo means that you feel like you are moving when you are not. Vertigo can also make you feel like things around you are moving when they are not. This feeling can come and go at any time. Vertigo often goes away on its own. Follow these instructions at home:  Avoid making fast movements.  Avoid driving.  Avoid using heavy machinery.  Avoid doing any task or activity that might cause danger to you or other people if you would have a vertigo attack while you are doing it.  Sit down right away if you feel dizzy or have trouble with your balance.  Take over-the-counter and prescription medicines only as told by your doctor.  Follow instructions from your doctor about which positions or movements you should avoid.  Drink enough fluid to keep your pee (urine) clear or pale yellow.  Keep all follow-up visits as told by your doctor. This is important. Contact a doctor if:  Medicine does not help your vertigo.  You have a fever.  Your problems get worse or you have new symptoms.  Your family or friends see changes in your behavior.  You feel sick to your stomach (nauseous) or you throw up (vomit).  You have a "pins and needles" feeling or you are numb in part of your body. Get help right away if:  You have trouble moving or talking.  You are always dizzy.  You pass out (faint).  You get very bad headaches.  You feel weak or have trouble using your hands, arms, or legs.  You have changes in your hearing.  You have changes in your seeing (vision).  You get a stiff neck.  Bright light starts to bother you. This information is not intended to replace advice given to you by your health care provider. Make  sure you discuss any questions you have with your health care provider. Document Released: 05/31/2008 Document Revised: 01/28/2016 Document Reviewed: 12/15/2014 Elsevier Interactive Patient Education  [employer].

## 2018-05-15 NOTE — Progress Notes (Signed)
Mike Day , 07/04/1946, 72 y.o., male MRN: [id number] Patient Care Team    Relationship Specialty Notifications Start End  McGowen, Adrian Blackwater, MD PCP - General Family Medicine  10/01/15   Rod Can, MD Consulting Physician Orthopedic Surgery  10/03/15   Sharmon Revere Physician Assistant Cardiology  04/29/16   Rolm Bookbinder, MD Consulting Physician Dermatology  08/24/17     Chief Complaint  Patient presents with  . Dizziness    positional     Subjective: Pt presents for an OV with complaints of room spinning dizziness of 1 day  Duration. He called into triage and was encouraged to go to ED and declined.  He is accompanied by his wife today and needed assistance to walk back to the room. They report he woke up yesterday morning and felt dizzy. He tried to eat a bagel but felt nauseated. He went to church, but felt weak. After church he experienced nausea and vomit. During this time his wife took his blood pressure and it was 205/103. His BP was checked multiple times after, and returned to 131/70. He reports the dizziness is constantly present, but worse with transitioning positions. He has never had symptoms like this in the past. He has A.Fib and on eliquis. He denies any bleeding,  injury, headache, blurred vision, tinnitus, slurring, aphasia, dysphagia, ext weakness, chest pain, fever, chills, abd pain, diarrhea, dysuria or shortness of breath. He denies recent illness prior to onset of symptoms.  Depression screen [facility] 2/9 08/24/2017 06/05/2015 07/02/2013  Decreased Interest 0 0 0  Down, Depressed, Hopeless 0 0 0  PHQ - 2 Score 0 0 0    Allergies  Allergen Reactions  . Levofloxacin     REACTION: Rash  . Tape Other (See Comments)    ADHESIVE TAPE; BLISTERS,  . Neosporin [Neomycin-Bacitracin Zn-Polymyx] Other (See Comments)    Dry/scaly skin   Social History   Tobacco Use  . Smoking status: Former Smoker    Last attempt to quit: 09/06/2007    Years since quitting:  10.6  . Smokeless tobacco: Never Used  . Tobacco comment: smoked 1963 -2009; total 5 packs of cigarettes in life . Cigars < 1 / day on average 1992-2009  Substance Use Topics  . Alcohol use: Yes    Alcohol/week: 7.0 standard drinks    Types: 7 Glasses of wine per week    Comment: Socially   Past Medical History:  Diagnosis Date  . Anemia    Early 20's  . Atrial flutter ([place]) 2008   Status post [facility] Dr Cristopher Peru  . Chronic atrial fibrillation (HCC)    Eliquis  . Diabetes mellitus without complication ([place])    Dx by A1c criteria 2014  . History of echocardiogram    Echo 12/14Severe basal septal hypertrophy, moderate LVH, EF 60-65%, moderate LAE, mild RAE   . Hx of adenomatous colonic polyps   . Hyperlipidemia   . Hypertension   . Prostate cancer [facility]) 2006   Prostatectomy   Past Surgical History:  Procedure Laterality Date  . COLONOSCOPY W/ POLYPECTOMY  2003;2010;04/2014   Tubular adenoma: recall 5 yrs (after 04/2019)  . HERNIA REPAIR  07/2008   Dr.Martin  . MOHS SURGERY     left hand  . 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 RADIOFREQUENCY ABLATION  01/15/2007   for ectopic atrial foci  . SEPTOPLASTY  Age 43  . TRANSTHORACIC ECHOCARDIOGRAM  09/02/2013  Normal.  EF 60%.  LAE and RAE.   Family History  Problem Relation Age of Onset  . Lung disease Father        Black Lung  . Heart disease Father        Congenital  . Emphysema Father   . Diabetes Mother   . Stroke Mother 70  . Hypertension Brother   . Heart attack Brother 49       sudden death  . Cancer Neg Hx    Allergies as of 05/15/2018      Reactions   Levofloxacin    REACTION: Rash   Tape Other (See Comments)   ADHESIVE TAPE; BLISTERS,   Neosporin [neomycin-bacitracin Zn-polymyx] Other (See Comments)   Dry/scaly skin      Medication List        Accurate as of 05/15/18  5:54 PM. Always use your most recent med list.          amLODipine 5 MG tablet Commonly known as:   NORVASC Take 1 tablet (5 mg total) by mouth daily. Please keep upcoming appt in October for future refills. Thank you   apixaban 5 MG Tabs tablet Commonly known as:  ELIQUIS Take 1 tablet (5 mg total) by mouth 2 (two) times daily.   lisinopril 40 MG tablet Commonly known as:  PRINIVIL,ZESTRIL Take 1 tablet (40 mg total) by mouth daily. Please keep upcoming appt in October for future refills. Thank you   meclizine 25 MG tablet Commonly known as:  ANTIVERT Take 1 tablet (25 mg total) by mouth 3 (three) times daily as needed for dizziness.   ondansetron 4 MG tablet Commonly known as:  ZOFRAN Take 1 tablet (4 mg total) by mouth every 8 (eight) hours as needed for nausea or vomiting.   simvastatin 20 MG tablet Commonly known as:  ZOCOR Take 1 tablet (20 mg total) by mouth at bedtime. Please keep upcoming appt in October for future refills. Thank you       All past medical history, surgical history, allergies, family history, immunizations andmedications were updated in the EMR today and reviewed under the history and medication portions of their EMR.     ROS: Negative, with the exception of above mentioned in HPI   Objective:  BP 135/77 (BP Location: Right Arm, Cuff Size: Normal)   Pulse 76   Temp 97.9 F (36.6 C)   Resp 20   Ht '6\' 2"'  (1.88 m)   Wt 200 lb (90.7 kg)   SpO2 98%   BMI 25.68 kg/m  Body mass index is 25.68 kg/m. Orthostatics negative Gen: Afebrile. No acute distress. Nontoxic in appearance, well developed, well nourished. Needing assistance to stand/walk HENT: AT. [place]. Bilateral TM visualized without erythema or fullness. Mildly dry mucous membranes, no oral lesions. Bilateral nares without erythema or drainage. Throat without erythema or exudates. No cough or hoarseness.  Eyes:Pupils Equal Round Reactive to light, Extraocular movements intact,  Conjunctiva without redness, discharge or icterus. Neck/lymp/endocrine: Supple,no lymphadenopathy CV: irreg irreg, no  edema Chest: CTAB, no wheeze or crackles. Good air movement, normal resp effort.  Neuro/MSK: Holding on to wife for balance. Slow to stand on his own, guarded but capable. Walks to exam table with feet wide base and shuffle. PERLA. EOMi. Alert. Oriented x3. Uvula midline, Cranial nerves II through XII intact. Muscle strength 5/5 B U/L extremity. No pronator drift. Neg Romberg. Heel to shin normal. Finger to nose normal.  Psych: Normal affect, dress and demeanor. Normal speech.  Normal thought content and judgment.  Assessment/Plan: Mike Day is a 72 y.o. male present for OV for  Dizziness/Permanent atrial fibrillation ([place]) - Poss. Vertigo, cause unknown. concern for NPH with gait, however he has no other symptoms today, onset is abrupt,  and is actually in good spirits considering. His exam is normal/baseline, with the exception of gait. He is no longer nauseated. His VSS and orthostatics are negative. Lengthy discussion with he and his wife surrounding his symptoms. Can not rule out TIA/stroke or NPH without CT/MRI. Will obtain TSH, CBC and CMP to rule out low sodium, infection, anemia as cause.  - he has had prostate cancer with prostectomy, last PSA 08/2017 <0.01 - Comp Met (CMET) - TSH - CBC w/Diff - HYDRATE - prescribed antivert and zofran. Start antivert ASAP. Zofran available in the event he gets nauseated again. Await lab results. If symptoms do not respond to med and/or labs do not point to other potential cause--> low threshold to image brain ASAP   - Pt and his wife, were STRONGLY encouraged if any worsening symptoms he is to go to ED immediately to be further evaluated. They understand instructions and state they will go if symptoms worsen.  - close follow up with PCP will be arranged on call back after results received.   Reviewed expectations re: course of current medical issues.  Discussed self-management of symptoms.  Outlined signs and symptoms indicating need for more  acute intervention.  Patient verbalized understanding and all questions were answered.  Patient received an After-Visit Summary.    Orders Placed This Encounter  Procedures  . Comp Met (CMET)  . TSH  . CBC w/Diff   > 25 minutes spent with patient, >50% of time spent face to face counseling    Note is dictated utilizing voice recognition software. Although note has been proof read prior to signing, occasional typographical errors still can be missed. If any questions arise, please do not hesitate to call for verification.   electronically signed by:  Howard Pouch, DO  [place]

## 2018-05-15 NOTE — Telephone Encounter (Signed)
Pt requesting a refill on Eliquis sent to [facility] mail order, enough medication until appt in October, pharmacy accepts 90 day supply. Please address

## 2018-05-15 NOTE — Telephone Encounter (Signed)
Pt c/o moderate dizziness that began Monday. Pt having vertigo that is worsened with standing or with sudden motions of the head. Pt also c/o lightheadedness. Pt has h/o a fib and this am his BP is 131/76 HR 71.   Pt stated he has had similar symptoms before with some cough relief medicine. Pt states that his head feels like it is stuffed with feathers. Pt given care advice and an appointment with Dr Raoul Pitch at 1:15 pm today.  Reason for Disposition . [1] MODERATE dizziness (e.g., vertigo; feels very unsteady, interferes with normal activities) AND [2] has NOT been evaluated by physician for this  Answer Assessment - Initial Assessment Questions 1. DESCRIPTION: "Describe your dizziness."     Feels like the bed is vertigo 2. VERTIGO: "Do you feel like either you or the room is spinning or tilting?"      yes 3. LIGHTHEADED: "Do you feel lightheaded?" (e.g., somewhat faint, woozy, weak upon standing)     lightheaded 4. SEVERITY: "How bad is it?"  "Can you walk?"   - MILD - Feels unsteady but walking normally.   - MODERATE - Feels very unsteady when walking, but not falling; interferes with normal activities (e.g., school, work) .   - SEVERE - Unable to walk without falling (requires assistance).     moderate 5. ONSET:  "When did the dizziness begin?"     Monday 6. AGGRAVATING FACTORS: "Does anything make it worse?" (e.g., standing, change in head position)     Standing, sudden motions of head 7. CAUSE: "What do you think is causing the dizziness?"     Pt does not know 8. RECURRENT SYMPTOM: "Have you had dizziness before?" If so, ask: "When was the last time?" "What happened that time?"     Few years ago took cough relief medicine and caused similar symptoms 9. OTHER SYMPTOMS: "Do you have any other symptoms?" (e.g., headache, weakness, numbness, vomiting, earache)     Headache feels "stuffed" weakness all over 10. PREGNANCY: "Is there any chance you are pregnant?" "When was your last  menstrual period?"       n/a  Protocols used: DIZZINESS - VERTIGO-A-AH

## 2018-05-16 ENCOUNTER — Emergency Department (HOSPITAL_COMMUNITY): Payer: Medicare Other

## 2018-05-16 ENCOUNTER — Encounter (HOSPITAL_COMMUNITY): Payer: Self-pay

## 2018-05-16 ENCOUNTER — Other Ambulatory Visit: Payer: Self-pay

## 2018-05-16 ENCOUNTER — Encounter: Payer: Self-pay | Admitting: Family Medicine

## 2018-05-16 ENCOUNTER — Telehealth: Payer: Self-pay | Admitting: Family Medicine

## 2018-05-16 ENCOUNTER — Emergency Department (HOSPITAL_COMMUNITY)
Admission: EM | Admit: 2018-05-16 | Discharge: 2018-05-16 | Disposition: A | Discharge status: Home / Self Care | Payer: Medicare Other | Attending: Emergency Medicine | Admitting: Emergency Medicine

## 2018-05-16 DIAGNOSIS — E119 Type 2 diabetes mellitus without complications: Secondary | ICD-10-CM | POA: Insufficient documentation

## 2018-05-16 DIAGNOSIS — I4892 Unspecified atrial flutter: Secondary | ICD-10-CM | POA: Insufficient documentation

## 2018-05-16 DIAGNOSIS — Z87891 Personal history of nicotine dependence: Secondary | ICD-10-CM | POA: Diagnosis not present

## 2018-05-16 DIAGNOSIS — R2689 Other abnormalities of gait and mobility: Secondary | ICD-10-CM

## 2018-05-16 DIAGNOSIS — R42 Dizziness and giddiness: Secondary | ICD-10-CM | POA: Diagnosis not present

## 2018-05-16 DIAGNOSIS — Z8546 Personal history of malignant neoplasm of prostate: Secondary | ICD-10-CM

## 2018-05-16 DIAGNOSIS — I482 Chronic atrial fibrillation: Secondary | ICD-10-CM | POA: Diagnosis not present

## 2018-05-16 DIAGNOSIS — Z7901 Long term (current) use of anticoagulants: Secondary | ICD-10-CM

## 2018-05-16 DIAGNOSIS — R2681 Unsteadiness on feet: Secondary | ICD-10-CM

## 2018-05-16 DIAGNOSIS — I10 Essential (primary) hypertension: Secondary | ICD-10-CM | POA: Diagnosis not present

## 2018-05-16 DIAGNOSIS — G3281 Cerebellar ataxia in diseases classified elsewhere: Secondary | ICD-10-CM

## 2018-05-16 DIAGNOSIS — I4821 Permanent atrial fibrillation: Secondary | ICD-10-CM

## 2018-05-16 LAB — CBG MONITORING, ED: GLUCOSE-CAPILLARY: 113 mg/dL — AB (ref 70–99)

## 2018-05-16 LAB — URINALYSIS, ROUTINE W REFLEX MICROSCOPIC
BACTERIA UA: NONE SEEN
BILIRUBIN URINE: NEGATIVE
Glucose, UA: NEGATIVE mg/dL
KETONES UR: NEGATIVE mg/dL
Leukocytes, UA: NEGATIVE
Nitrite: NEGATIVE
PROTEIN: NEGATIVE mg/dL
Specific Gravity, Urine: 1.013 (ref 1.005–1.030)
pH: 5 (ref 5.0–8.0)

## 2018-05-16 NOTE — Discharge Instructions (Addendum)
Please review the discharge instructions and try doing the Epley maneuvers to see if they help with your dizziness.

## 2018-05-16 NOTE — Telephone Encounter (Addendum)
If it is not helping, then he does not have to take it. Which is more of a reason to get emergent treatment. In addition, I am unable to get an outpatient image until tomorrow. I strongly advise them to got to the ED now to get evaluated and emergent image.

## 2018-05-16 NOTE — Telephone Encounter (Signed)
Patient's wife calling and states that there are no orders in and that thte ED is telling her that they are outside of thr stroke window so therefore this is not emergent. States that the rn at the ED told her that she could see if a DR at the ed could put in the order for an MRI. Would have to wait and see. Please advise.  CB#: [phone]

## 2018-05-16 NOTE — ED Notes (Signed)
Patient transported to MRI. Pt stated they are not claustrophobic

## 2018-05-16 NOTE — Telephone Encounter (Signed)
Pt was called today to discuss lab work, which was normal and his response to antivert. He states he had some improvement with antivert last night, but not resolved. Today his ataxia is back to how it was prior to antivert. His nausea has improved. He is eating and drinking. CBC, CMP and TSH normal.  - I have concerns for stroke, NPH or lesion (prior h/o prostate cancer) with ongoing symptoms. Ordered MRI w contrast to eval further to be completed today.

## 2018-05-16 NOTE — Telephone Encounter (Signed)
Spoke with patient and patients wife reviewed instructions and recommendation to go to Er for evaluation and imaging. Patient wife verbalized understanding.

## 2018-05-16 NOTE — Telephone Encounter (Signed)
Patients wife, Stanton Kidney calling back and states she failed to ask if her husband should still take the meclizine (ANTIVERT) 25 MG tablet. She states it is not helping the dizziness, only making him sleepy.

## 2018-05-16 NOTE — ED Triage Notes (Signed)
Pt from home. Pt has had dizziness since Monday. Pt went to PCP and given antivert which did not help, had labs done, and wanted to do an MRI. PCP could not get pt into MRI today, so pt was sent to ED.

## 2018-05-16 NOTE — ED Provider Notes (Addendum)
[facility] DEPT Provider Note   CSN: [id number] Arrival date & time: 05/16/18  1723     History   Chief Complaint Chief Complaint  Patient presents with  . Dizziness    HPI KASTIN CERDA is a 72 y.o. male.  HPI Pt started having trouble with dizziness on Monday.  He started developing a headache.  He felt like he noticed his vision change. His symptoms persisted and he felt like his balance was off.  It gets worse with movement. He saw the doctor yesterday. He was given a dose of meclizine but it has not helped much.  She called back and checked on him today.  The doctor tried to schedule an MRI but they could not arrange it so he was sent to the ED. Past Medical History:  Diagnosis Date  . Anemia    Early 20's  . Atrial flutter ([place]) 2008   Status post [facility] Dr Cristopher Peru  . Chronic atrial fibrillation (HCC)    Eliquis  . Diabetes mellitus without complication ([place])    Dx by A1c criteria 2014  . History of echocardiogram    Echo 12/14Severe basal septal hypertrophy, moderate LVH, EF 60-65%, moderate LAE, mild RAE   . Hx of adenomatous colonic polyps   . Hyperlipidemia   . Hypertension   . Prostate cancer [facility]) 2006   Prostatectomy    Patient Active Problem List   Diagnosis Date Noted  . Chronic anticoagulation 04/14/2014  . Atrial flutter ([place])   . Hyperglycemia 03/11/2009  . SKIN CANCER, HX OF 03/11/2009  . Essential hypertension 09/17/2008  . Permanent atrial fibrillation ([place]) 09/17/2008  . Hyperlipidemia 11/01/2007  . PROSTATE CANCER, HX OF 01/18/2007  . Tubular adenoma of colon 01/12/2007    Past Surgical History:  Procedure Laterality Date  . COLONOSCOPY W/ POLYPECTOMY  2003;2010;04/2014   Tubular adenoma: recall 5 yrs (after 04/2019)  . HERNIA REPAIR  07/2008   Dr.Martin  . MOHS SURGERY     left hand  . 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 RADIOFREQUENCY ABLATION  01/15/2007   for  ectopic atrial foci  . SEPTOPLASTY  Age 52  . TRANSTHORACIC ECHOCARDIOGRAM  09/02/2013   Normal.  EF 60%.  LAE and RAE.        Home Medications    Prior to Admission medications   Medication Sig Start Date End Date Taking? Authorizing Provider  acetaminophen (TYLENOL) 500 MG tablet Take 1,000 mg by mouth every 6 (six) hours as needed for mild pain or headache.   Yes [provider]  amLODipine (NORVASC) 5 MG tablet Take 1 tablet (5 mg total) by mouth daily. Please keep upcoming appt in October for future refills. Thank you 05/15/18  Yes End, Harrell Gave, MD  apixaban (ELIQUIS) 5 MG TABS tablet Take 1 tablet (5 mg total) by mouth 2 (two) times daily. 05/15/18  Yes End, Harrell Gave, MD  lisinopril (PRINIVIL,ZESTRIL) 40 MG tablet Take 1 tablet (40 mg total) by mouth daily. Please keep upcoming appt in October for future refills. Thank you 05/15/18  Yes End, Harrell Gave, MD  meclizine (ANTIVERT) 25 MG tablet Take 1 tablet (25 mg total) by mouth 3 (three) times daily as needed for dizziness. 05/15/18  Yes Kuneff, Renee A, DO  simvastatin (ZOCOR) 20 MG tablet Take 1 tablet (20 mg total) by mouth at bedtime. Please keep upcoming appt in October for future refills. Thank you 05/15/18  Yes End, Harrell Gave,  MD  ondansetron (ZOFRAN) 4 MG tablet Take 1 tablet (4 mg total) by mouth every 8 (eight) hours as needed for nausea or vomiting. 05/15/18   Ma Hillock, DO    Family History Family History  Problem Relation Age of Onset  . Lung disease Father        Black Lung  . Heart disease Father        Congenital  . Emphysema Father   . Diabetes Mother   . Stroke Mother 67  . Hypertension Brother   . Heart attack Brother 35       sudden death  . Cancer Neg Hx     Social History Social History   Tobacco Use  . Smoking status: Former Smoker    Last attempt to quit: 09/06/2007    Years since quitting: 10.6  . Smokeless tobacco: Never Used  . Tobacco comment: smoked 1963 -2009; total 5  packs of cigarettes in life . Cigars < 1 / day on average 1992-2009  Substance Use Topics  . Alcohol use: Yes    Alcohol/week: 7.0 standard drinks    Types: 7 Glasses of wine per week    Comment: Socially  . Drug use: No     Allergies   Levofloxacin; Tape; and Neosporin [neomycin-bacitracin zn-polymyx]   Review of Systems Review of Systems  All other systems reviewed and are negative.    Physical Exam Updated Vital Signs BP (!) 162/95 (BP Location: Right Arm)   Pulse 61   Temp 97.7 F (36.5 C)   Resp 18   Ht 1.88 m (6\' 2" )   Wt 90.7 kg   SpO2 99%   BMI 25.67 kg/m   Physical Exam  Constitutional: He is oriented to person, place, and time. He appears well-developed and well-nourished. No distress.  HENT:  Head: Normocephalic and atraumatic.  Right Ear: External ear normal.  Left Ear: External ear normal.  Mouth/Throat: Oropharynx is clear and moist.  Eyes: Conjunctivae are normal. Right eye exhibits no discharge. Left eye exhibits no discharge. No scleral icterus.  Neck: Neck supple. No tracheal deviation present.  Cardiovascular: Normal rate, regular rhythm and intact distal pulses.  Pulmonary/Chest: Effort normal and breath sounds normal. No stridor. No respiratory distress. He has no wheezes. He has no rales.  Abdominal: Soft. Bowel sounds are normal. He exhibits no distension. There is no tenderness. There is no rebound and no guarding.  Musculoskeletal: He exhibits no edema or tenderness.  Neurological: He is alert and oriented to person, place, and time. He has normal strength. No cranial nerve deficit (No facial droop, extraocular movements intact, tongue midline ) or sensory deficit. He exhibits normal muscle tone. He displays no seizure activity. Coordination normal.  No pronator drift bilateral upper extrem, able to hold both legs off bed for 5 seconds, sensation intact in all extremities, no visual field cuts, no left or right sided neglect, normal finger-nose  exam bilaterally, no nystagmus noted   Skin: Skin is warm and dry. No rash noted.  Psychiatric: He has a normal mood and affect.  Nursing note and vitals reviewed.    ED Treatments / Results  Labs (all labs ordered are listed, but only abnormal results are displayed) Labs Reviewed  URINALYSIS, ROUTINE W REFLEX MICROSCOPIC - Abnormal; Notable for the following components:      Result Value   Hgb urine dipstick SMALL (*)    All other components within normal limits  CBG MONITORING, ED - Abnormal; Notable for  the following components:   Glucose-Capillary 113 (*)    All other components within normal limits    EKG EKG Interpretation  Date/Time:  Wednesday May 16 2018 20:05:01 EDT Ventricular Rate:  61 PR Interval:    QRS Duration: 109 QT Interval:  413 QTC Calculation: 416 R Axis:   68 Text Interpretation:  Atrial fibrillation Since last tracing Atrial fibrillation is new Confirmed by Daleen Bo [phone]) on 05/16/2018 8:29:43 PM   Radiology Mr Brain Wo Contrast  Result Date: 05/16/2018 CLINICAL DATA:  Initial evaluation for acute dizziness, ataxia. EXAM: MRI HEAD WITHOUT CONTRAST TECHNIQUE: Multiplanar, multiecho pulse sequences of the brain and surrounding structures were obtained without intravenous contrast. COMPARISON:  None. FINDINGS: Brain: Moderate cerebral atrophy with mild to moderate chronic small vessel ischemic disease. No acute intracranial infarct. No areas of chronic cortical infarction. No acute or chronic intracranial hemorrhage. No mass lesion, midline shift or mass effect. No hydrocephalus. No extra-axial fluid collection. 2 cherry gland normal. Vascular: Major intracranial vascular flow voids maintained Skull and upper cervical spine: Craniocervical junction normal. No focal marrow replacing lesion. Scalp soft tissues unremarkable. Sinuses/Orbits: Globes and orbital soft tissues within normal limits. Paranasal sinuses are clear. Small bilateral mastoid  effusions, of doubtful significance. Other: None. IMPRESSION: 1. No acute intracranial abnormality. 2. Generalized cerebral atrophy with mild to moderate chronic small vessel ischemic disease. Electronically Signed   By: Jeannine Boga M.D.   On: 05/16/2018 22:03    Procedures Procedures (including critical care time)  Medications Ordered in ED Medications - No data to display   Initial Impression / Assessment and Plan / ED Course  I have reviewed the triage vital signs and the nursing notes.  Pertinent labs & imaging results that were available during my care of the patient were reviewed by me and considered in my medical decision making (see chart for details).  Clinical Course as of May 16 2249  Wed May 16, 2018  2241 Patient had a CBC and comprehensive metabolic panel yesterday.  It was normal   [JK]  2241 MRI is negative for any acute abnormality   [JK]    Clinical Course User Index [JK] Dorie Rank, MD    Patient presented to the emergency room for evaluation of dizziness.  Patient was seen by the primary care doctor and they were concerned about an acute stroke.   Patient had an MRI this evening.  MRI is negative for acute stroke.  I suspect patient's symptoms are related to peripheral vertigo.  Discussed Epley maneuvers.  Consider outpatient follow-up for physical therapy if the symptoms persist. Final Clinical Impressions(s) / ED Diagnoses   Final diagnoses:  Vertigo    ED Discharge Orders    None       Dorie Rank, MD 05/16/18 2250 ROS added   Dorie Rank, MD 05/28/18 1129

## 2018-05-17 ENCOUNTER — Other Ambulatory Visit: Payer: Medicare Other

## 2018-05-17 ENCOUNTER — Telehealth: Payer: Self-pay | Admitting: *Deleted

## 2018-05-17 ENCOUNTER — Telehealth: Payer: Self-pay | Admitting: Family Medicine

## 2018-05-17 DIAGNOSIS — R42 Dizziness and giddiness: Secondary | ICD-10-CM

## 2018-05-17 NOTE — Telephone Encounter (Signed)
Copied from [place] [phone]. Topic: Quick Communication - See Telephone Encounter >> May 17, 2018  9:41 AM Bea Graff, NT wrote: CRM for notification. See Telephone encounter for: 05/17/18. Pt would like a call with his MRI results and to see if he needs to come in for follow-up.

## 2018-05-17 NOTE — Telephone Encounter (Signed)
Patient seen in ED. 

## 2018-05-17 NOTE — Telephone Encounter (Signed)
OK with me.

## 2018-05-17 NOTE — Telephone Encounter (Signed)
Patient has been scheduled for TOC appt

## 2018-05-17 NOTE — Telephone Encounter (Signed)
Spoke with patient reviewed MRI results ,instructions and information. Patient verbalized understanding.

## 2018-05-17 NOTE — Telephone Encounter (Signed)
Please inform patient the following information: He was seen in the ED, The ED physician's note states the MRI was without cause of dizziness- which rules out stroke, brain lesions/tumors etc. Therefore presumed diagnosis is vertigo.  He can take the Antivert if it helps at all. The ED note reports they gave him instructions on Eply maneuver, which can help relieve main cause of  Vertigo.  We could also try to get him set up with PT called [facility] to help correct, I am uncertain how quick they can get him in and I know they had a trip planned next week. I went ahead an placed that referral to try to get him in.  There is no "normal" length vertigo lasts, can be minutes, days or a few weeks in some people. If it continues after PT, then We would need to see him back.  The actual results are below. #2, cerebral atrophy- is usually age related and so "chronic small ischemic disease"--> maintaining good control of sugar and blood pressure, and continuing his simvastatin is only treatment for these.   MRI: 1. No acute intracranial abnormality. 2. Generalized cerebral atrophy with mild to moderate chronic small vessel ischemic disease.

## 2018-05-17 NOTE — Telephone Encounter (Signed)
Patient requesting to change PCP to Dr Raoul Pitch. Ok to change?

## 2018-05-17 NOTE — Telephone Encounter (Signed)
I do not mind, but Dr. Anitra Lauth needs to approve first. IF approved, pt then needs to be put on my schedule for TOC in a NEW PT slot and packet given to them to complete.  Thanks.

## 2018-05-18 ENCOUNTER — Other Ambulatory Visit: Payer: Medicare Other

## 2018-05-18 NOTE — Addendum Note (Signed)
Addended by: Leota Jacobsen on: 05/18/2018 08:20 AM   Modules accepted: Orders

## 2018-05-22 ENCOUNTER — Encounter: Payer: Self-pay | Admitting: Physical Therapy

## 2018-05-22 ENCOUNTER — Ambulatory Visit: Payer: Medicare Other | Attending: Family Medicine | Admitting: Physical Therapy

## 2018-05-22 ENCOUNTER — Other Ambulatory Visit: Payer: Self-pay

## 2018-05-22 DIAGNOSIS — R2681 Unsteadiness on feet: Secondary | ICD-10-CM | POA: Diagnosis not present

## 2018-05-22 DIAGNOSIS — R42 Dizziness and giddiness: Secondary | ICD-10-CM

## 2018-05-22 DIAGNOSIS — R262 Difficulty in walking, not elsewhere classified: Secondary | ICD-10-CM | POA: Insufficient documentation

## 2018-05-22 NOTE — Patient Instructions (Signed)
Gaze Stabilization: Tip Card  1.Target must remain in focus, not blurry, and appear stationary while head is in motion. 2.Perform exercises with small head movements (45 to either side of midline). 3.Increase speed of head motion so long as target is in focus. 4.If you wear eyeglasses, be sure you can see target through lens (therapist will give specific instructions for bifocal / progressive lenses). Don't wear glasses.  5.These exercises may provoke dizziness or nausea. Work through these symptoms. If too dizzy (moderate or a 5 out of 10, with 10=your falling), slow head movement slightly. Rest between each exercise. 6.Exercises demand concentration; avoid distractions.  Copyright  VHI. All rights reserved.     Special Instructions: (use these tips if you can't tolerate the instructions as written) Exercises may bring on mild to moderate symptoms of dizziness/nausea that resolve within 30 minutes of completing exercises. If symptoms are lasting longer than 30 minutes, modify your exercises by:  >decreasing the # of times you complete each activity >ensuring your symptoms return to baseline before moving onto the next exercise >dividing up exercises so you do not do them all in one session, but multiple short sessions throughout the day >doing them once a day until symptoms improve   Gaze Stabilization: Sitting    Keeping eyes on target on wall 3 feet away, and move head side to side for 10 repetitions (left to right= 1 rep) seconds. Rest between sets. Repeat two more sets of 10. Do __3__ sessions per day. Increase the speed of your head turns and then progress to 30 seconds of head turns. Copyright  VHI. All rights reserved.    Gaze Stabilization - Tip Card  IF you can do 3 reps of 30 seconds without symptoms, then can begin doing in standing.  For safety, perform standing exercises close to a counter, wall, corner, or next to someone.   Gaze Stabilization - Standing Feet  Apart   Feet shoulder width apart, keeping eyes on target on wall 3 feet away, tilt head down slightly and move head side to side for 30 seconds. Repeat 2 more reps of 30 seconds. *Work up to tolerating 60 seconds, as able. Do 3 sessions per day.   Copyright  VHI. All rights reserved.

## 2018-05-22 NOTE — Therapy (Signed)
[facility] [address] [place], [place], [address] Phone: [phone]   Fax:  [phone]  Physical Therapy Evaluation  Patient Details  Name: MANCEL LARDIZABAL MRN: [id number] Date of Birth: [birth year] Referring Provider: Ma Hillock, DO   Encounter Date: 05/22/2018  PT End of Session - 05/22/18 2115    Visit Number  1    Number of Visits  9    Date for PT Re-Evaluation  06/21/18    Authorization Type  Medicare A/B    Authorization Time Period  05/22/18 to 08/20/2018    PT Start Time  0935    PT Stop Time  1024    PT Time Calculation (min)  49 min    Activity Tolerance  Patient tolerated treatment well    Behavior During Therapy  [facility] for tasks assessed/performed       Past Medical History:  Diagnosis Date  . Anemia    Early 20's  . Atrial flutter ([place]) 2008   Status post RFCA Dr Cristopher Peru  . Chronic atrial fibrillation (HCC)    Eliquis  . Diabetes mellitus without complication ([place])    Dx by A1c criteria 2014  . History of echocardiogram    Echo 12/14Severe basal septal hypertrophy, moderate LVH, EF 60-65%, moderate LAE, mild RAE   . Hx of adenomatous colonic polyps   . Hyperlipidemia   . Hypertension   . Prostate cancer [facility]) 2006   Prostatectomy    Past Surgical History:  Procedure Laterality Date  . COLONOSCOPY W/ POLYPECTOMY  2003;2010;04/2014   Tubular adenoma: recall 5 yrs (after 04/2019)  . HERNIA REPAIR  07/2008   Dr.Martin  . MOHS SURGERY     left hand  . 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 RADIOFREQUENCY ABLATION  01/15/2007   for ectopic atrial foci  . SEPTOPLASTY  Age 62  . TRANSTHORACIC ECHOCARDIOGRAM  09/02/2013   Normal.  EF 60%.  LAE and RAE.    There were no vitals filed for this visit.   Subjective Assessment - 05/22/18 0940    Subjective  began last Monday. Woke up and felt dizzy and it got worse as the day went on. Had dry heaves; has had  one day when he felt almost normal; working last night in the low light it got worse. In June had a golf ball bounce off tree and hit him in rt eye and wonders if that was a factor.     Pertinent History  a-fib chronic anti-coag; DM; HLD, HTN, prostate Ca    Patient Stated Goals  Be able to go on his trip tomorrow to [place], [place]; to be able to get back to his usual level of activity    Currently in Pain?  No/denies         [facility] PT Assessment - 05/22/18 2104      Assessment   Medical Diagnosis  dizziness, vertigo    Referring Provider  Howard Pouch A, DO    Onset Date/Surgical Date  05/14/18    Prior Therapy  none      Precautions   Precautions  Fall      Restrictions   Weight Bearing Restrictions  No      Balance Screen   Has the patient fallen in the past 6 months  No    Has the patient had a decrease in activity level because of a fear of falling?   Yes  Is the patient reluctant to leave their home because of a fear of falling?   No      Home Environment   Living Environment  Private residence    Living Arrangements  Spouse/significant other    Available Help at Discharge  Family;Available 24 hours/day      Prior Function   Level of Independence  Independent    Vocation  Retired    Leisure  travel      Cognition   Overall Cognitive Status  Within Functional Limits for tasks assessed           Vestibular Assessment - 05/22/18 0941      Vestibular Assessment   General Observation  felt lightheaded initially; as the day went on had dry heaves      Symptom Behavior   Type of Dizziness  --   uneasiness, blurred vision,    Frequency of Dizziness  daily    Duration of Dizziness  typically < 1 minute    Aggravating Factors  Looking up to the ceiling;Sitting with head tilted back;Turning body quickly;Turning head quickly;Forward bending    Relieving Factors  Head stationary;Slow movements      Occulomotor Exam   Occulomotor Alignment  Normal   bur head  turned to right; rt eye elevate   Spontaneous  Absent    Gaze-induced  Left beating nystagmus with L gaze    Smooth Pursuits  Saccades    Saccades  Intact      Vestibulo-Occular Reflex   VOR 2 Head and Object (x 2 viewing)  Able to do x 10 head turns to each side; asymptomatic    Comment  + HIT to left      Positional Testing   Dix-Hallpike  Dix-Hallpike Right;Dix-Hallpike Left    Horizontal Canal Testing  Horizontal Canal Right;Horizontal Canal Left      Dix-Hallpike Right   Dix-Hallpike Right Duration  0    Dix-Hallpike Right Symptoms  No nystagmus      Dix-Hallpike Left   Dix-Hallpike Left Duration  0    Dix-Hallpike Left Symptoms  No nystagmus      Horizontal Canal Right   Horizontal Canal Right Duration  0    Horizontal Canal Right Symptoms  Normal      Horizontal Canal Left   Horizontal Canal Left Duration  0    Horizontal Canal Left Symptoms  Normal      Cognition   Cognition Orientation Level  Appropriate for developmental age          Objective measurements completed on examination: See above findings.       Vestibular Treatment/Exercise - 05/22/18 0001      Vestibular Treatment/Exercise   Vestibular Treatment Provided  Gaze    Gaze Exercises  X1 Viewing Horizontal;X1 Viewing Vertical   compensatory saccades     X1 Viewing Horizontal   Foot Position  standing, seated    Time  --   <5 sec; <10 sec; 10 head turns   Comments  vision becomes double or blurry very quickly in standing and sitting; able to tolerate 10 head turns with eyes fixed on target      X1 Viewing Vertical   Foot Position  --   standing, seated   Time  --   30 sec   Comments  no synmptoms      Compensatory technique of fixing eyes on stationary target that he is walking/moving toward (to assist with traveling to [facility] later this  week).        PT Education - 05/22/18 2114    Education Details  results of evaluation; how systems interact to maintain balance;  importance and purpose of exercises    Person(s) Educated  Patient;Spouse    Methods  Explanation;Demonstration;Verbal cues;Handout    Comprehension  Verbalized understanding;Returned demonstration;Verbal cues required;Need further instruction          PT Long Term Goals - 05/22/18 2131      PT LONG TERM GOAL #1   Title  Patient will be independent with HEp (Target all LTGs 06/21/2018)    Time  4    Period  Weeks    Status  New      PT LONG TERM GOAL #2   Title  Patient will improve FGA score to >=23/30 to show decr fall risk.    Time  4    Period  Weeks    Status  New      PT LONG TERM GOAL #3   Title  Patient will improve gait velocity to >=2.62 ft/sec to demonstrate lesser fall risk in the community.     Time  4    Period  Weeks    Status  New      PT LONG TERM GOAL #4   Title  Patient will ambulate over outdoor level, paved surfaces modified independent with independent recovery if LOB occurs.     Time  4    Period  Weeks    Status  New             Plan - 05/22/18 2117    Clinical Impression Statement  Patient referred to OPPT with 8 days of dizziness and imbalance. Seen in ED 9/11 with MRI brain negative for cause of dizziness. Patient assessed for BPPV with negative tests. 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 Patient with increased dizziness throughout the testing and significant imbalance when standing after assessment. Patient educated in initial HEP to address hypofunction (and how to progress to next steps as pt will be out-of town for several days). Wife very attentive and willing to assist pt with HEP as needed. Patient can benefit from additional PT visits to decrease his dizziness and decr his fall risk. See below for PT interventions to address the deficits listed below.     History and Personal Factors relevant to plan of care:  PMH-a-fib chronic anti-coag; DM; HLD, HTN, prostate Ca;   Personal history-supportive family    Clinical Presentation  Evolving    Clinical Presentation due to:  Onset of symptoms <2 weeks ago; continued daily dizziness    Clinical Decision Making  Low    Rehab Potential  Good    PT Frequency  2x / week    PT Duration  4 weeks    PT Treatment/Interventions  ADLs/Self Care Home Management;Biofeedback;Canalith Repostioning;DME Instruction;Gait training;Stair training;Functional mobility training;Therapeutic exercise;Therapeutic activities;Balance training;Neuromuscular re-education;Patient/family education;Vestibular;Visual/perceptual remediation/compensation    PT Next Visit Plan  check pt's progress with VORx1; do FGA; do gait velocity; add balance exercises to HEP    Consulted and Agree with Plan of Care  Patient;Family member/caregiver    Family Member Consulted  wife       Patient will benefit from skilled therapeutic intervention in order to improve the following deficits and impairments:  Decreased activity tolerance, Decreased balance, Decreased mobility, Decreased knowledge of use of DME, Decreased range of motion, Dizziness, Difficulty walking, Impaired  sensation, Impaired vision/preception  Visit Diagnosis: Dizziness and giddiness - Plan: PT plan of care cert/re-cert  Unsteadiness on feet - Plan: PT plan of care cert/re-cert  Difficulty in walking, not elsewhere classified - Plan: PT plan of care cert/re-cert     Problem List Patient Active Problem List   Diagnosis Date Noted  . Chronic anticoagulation 04/14/2014  . Atrial flutter ([place])   . Hyperglycemia 03/11/2009  . SKIN CANCER, HX OF 03/11/2009  . Essential hypertension 09/17/2008  . Permanent atrial fibrillation ([place]) 09/17/2008  . Hyperlipidemia 11/01/2007  . PROSTATE CANCER, HX OF 01/18/2007  . Tubular adenoma of colon 01/12/2007    Rexanne Mano, PT 05/22/2018, 9:38 PM  [facility] [address] [place], [place], [address] Phone: [phone]   Fax:  [phone]  Name: CUINN WESTERHOLD MRN: [id number] Date of Birth: 1946-08-09

## 2018-06-04 ENCOUNTER — Ambulatory Visit: Payer: Medicare Other | Admitting: Physical Therapy

## 2018-06-04 ENCOUNTER — Encounter: Payer: Self-pay | Admitting: Physical Therapy

## 2018-06-04 ENCOUNTER — Ambulatory Visit (INDEPENDENT_AMBULATORY_CARE_PROVIDER_SITE_OTHER): Payer: Medicare Other | Admitting: *Deleted

## 2018-06-04 ENCOUNTER — Ambulatory Visit: Payer: Medicare Other

## 2018-06-04 DIAGNOSIS — Z23 Encounter for immunization: Secondary | ICD-10-CM | POA: Diagnosis not present

## 2018-06-04 DIAGNOSIS — R42 Dizziness and giddiness: Secondary | ICD-10-CM | POA: Diagnosis not present

## 2018-06-04 DIAGNOSIS — R2681 Unsteadiness on feet: Secondary | ICD-10-CM

## 2018-06-04 DIAGNOSIS — R262 Difficulty in walking, not elsewhere classified: Secondary | ICD-10-CM

## 2018-06-04 NOTE — Patient Instructions (Signed)
Gaze Stabilization: Tip Card  1.Target must remain in focus, not blurry, and appear stationary while head is in motion. 2.Perform exercises with small head movements (45 to either side of midline). 3.Increase speed of head motion so long as target is in focus. 4.If you wear eyeglasses, be sure you can see target through lens (therapist will give specific instructions for bifocal / progressive lenses). Don't wear glasses.  5.These exercises may provoke dizziness or nausea. Work through these symptoms. If too dizzy (moderate or a 5 out of 10, with 10=your falling), slow head movement slightly. Rest between each exercise. 6.Exercises demand concentration; avoid distractions.      Special Instructions: (use these tips if you can't tolerate the instructions as written) Exercises may bring on mild to moderate symptoms of dizziness/nausea that resolve within 30 minutes of completing exercises. If symptoms are lasting longer than 30 minutes, modify your exercises by:  >decreasing the # of times you complete each activity >ensuring your symptoms return to baseline before moving onto the next exercise >dividing up exercises so you do not do them all in one session, but multiple short sessions throughout the day >doing them once a day until symptoms improve    Gaze Stabilization - Tip Card   For safety, perform standing exercises close to a counter, wall, corner, or next to someone.   Gaze Stabilization - Standing Feet Apart   Feet shoulder width apart, keeping eyes on target on wall 3 feet away, tilt head down slightly and move head side to side for 30 seconds. Repeat 2 more reps of 30 seconds. Do 3 sessions per day.   Feet Together, Head Motion - Eyes Closed    With eyes closed and feet together, hold for 30 seconds. Repeat total of 3 times.   *When you can do 3 in a row for 30 seconds without touching the walls, then move head slowly, up and down x 10 then left and right x  10  Do __1__ sessions per day.

## 2018-06-04 NOTE — Therapy (Signed)
[facility] [address] [place], [place], [address] Phone: [phone]   Fax:  [phone]  Physical Therapy Treatment  Patient Details  Name: Mike Day MRN: [id number] Date of Birth: 16-Mar-1946 Referring Provider (PT): Ma Hillock, DO   Encounter Date: 06/04/2018  PT End of Session - 06/04/18 1652    Visit Number  2    Number of Visits  9    Date for PT Re-Evaluation  06/21/18    Authorization Type  Medicare A/B    Authorization Time Period  05/22/18 to 08/20/2018    PT Start Time  1533    PT Stop Time  1619    PT Time Calculation (min)  46 min    Equipment Utilized During Treatment  Gait belt    Activity Tolerance  Patient tolerated treatment well    Behavior During Therapy  [facility] for tasks assessed/performed       Past Medical History:  Diagnosis Date  . Anemia    Early 20's  . Atrial flutter ([place]) 2008   Status post RFCA Dr Cristopher Peru  . Chronic atrial fibrillation (HCC)    Eliquis  . Diabetes mellitus without complication ([place])    Dx by A1c criteria 2014  . History of echocardiogram    Echo 12/14Severe basal septal hypertrophy, moderate LVH, EF 60-65%, moderate LAE, mild RAE   . Hx of adenomatous colonic polyps   . Hyperlipidemia   . Hypertension   . Prostate cancer [facility]) 2006   Prostatectomy    Past Surgical History:  Procedure Laterality Date  . COLONOSCOPY W/ POLYPECTOMY  2003;2010;04/2014   Tubular adenoma: recall 5 yrs (after 04/2019)  . HERNIA REPAIR  07/2008   Dr.Martin  . MOHS SURGERY     left hand  . 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 RADIOFREQUENCY ABLATION  01/15/2007   for ectopic atrial foci  . SEPTOPLASTY  Age 72  . TRANSTHORACIC ECHOCARDIOGRAM  09/02/2013   Normal.  EF 60%.  LAE and RAE.    There were no vitals filed for this visit.  Subjective Assessment - 06/04/18 1530    Subjective  Did well on the trip to [place] but did not try to  walk on the beach (rocky, hills). Was not able to keep his balance to even tee-off when tried to play golf. Cannot drive at night--something about the lights hitting my eyes, I don't feel safe.      Pertinent History  a-fib chronic anti-coag; DM; HLD, HTN, prostate Ca    Patient Stated Goals  Be able to go on his trip tomorrow to [facility], [place]; to be able to get back to his usual level of activity "I'll never    Currently in Pain?  No/denies         [facility] PT Assessment - 06/04/18 1544      Ambulation/Gait   Gait velocity  32.8/6.94=4.73 ft/sec      Functional Gait  Assessment   Gait assessed   Yes    Gait Level Surface  Walks 20 ft in less than 5.5 sec, no assistive devices, good speed, no evidence for imbalance, normal gait pattern, deviates no more than 6 in outside of the 12 in walkway width.   5.3   Change in Gait Speed  Able to smoothly change walking speed without loss of balance or gait deviation. Deviate no more than 6 in outside of the 12 in walkway  width.    Gait with Horizontal Head Turns  Performs head turns with moderate changes in gait velocity, slows down, deviates 10-15 in outside 12 in walkway width but recovers, can continue to walk.    Gait with Vertical Head Turns  Performs task with moderate change in gait velocity, slows down, deviates 10-15 in outside 12 in walkway width but recovers, can continue to walk.    Gait and Pivot Turn  Pivot turns safely in greater than 3 sec and stops with no loss of balance, or pivot turns safely within 3 sec and stops with mild imbalance, requires small steps to catch balance.    Step Over Obstacle  Is able to step over 2 stacked shoe boxes taped together (9 in total height) without changing gait speed. No evidence of imbalance.    Gait with Narrow Base of Support  Ambulates 4-7 steps.   4 steps   Gait with Eyes Closed  Cannot walk 20 ft without assistance, severe gait deviations or imbalance, deviates greater than 15 in outside 12 in  walkway width or will not attempt task.    Ambulating Backwards  Walks 20 ft, no assistive devices, good speed, no evidence for imbalance, normal gait    Steps  Two feet to a stair, must use rail.    Total Score  18       Pt noted to assume posture with head turned slightly to the right. When assisted to neutral, he reported feeling like his head was turned to the left. Had pt close eyes and turn head left and right and stop where he thought midline/neutral would be. He stopped exactly in neutral and when opened his eyes reported it felt like neutral. Later in session, noted again he was maintaining head slightly turned to the right.              Vestibular Treatment/Exercise - 06/04/18 1641      Vestibular Treatment/Exercise   Vestibular Treatment Provided  Gaze    Gaze Exercises  X1 Viewing Horizontal;X1 Viewing Vertical      X1 Viewing Horizontal   Foot Position  standing feet apart    Time  --   15-30 seconds   Reps  6    Comments  pt initially continuing through ex despite target "moving an inch"; emphasized need to stop and let system reset when target begins to move      X1 Viewing Vertical   Foot Position  standing feet apart    Time  --   30 sec   Reps  2    Comments  no imbalance or dizziness; target in focus         Balance Exercises - 06/04/18 1644      Balance Exercises: Standing   Standing Eyes Opened  Narrow base of support (BOS);Solid surface;30 secs    Standing Eyes Closed  Wide (BOA);Narrow base of support (BOS);Solid surface   wide BOS x 30 sec; narrow x 5 sec up to 30 sec with ++sway       PT Education - 06/04/18 1645    Education Details  how vestibular system, somatosensory system and vision work together; updated HEP; results of FGA    Person(s) Educated  Patient;Spouse    Methods  Explanation;Demonstration;Handout;Verbal cues    Comprehension  Verbalized understanding;Returned demonstration;Need further instruction;Verbal cues required           PT Long Term Goals - 05/22/18 2131      PT  LONG TERM GOAL #1   Title  Patient will be independent with HEp (Target all LTGs 06/21/2018)    Time  4    Period  Weeks    Status  New      PT LONG TERM GOAL #2   Title  Patient will improve FGA score to >=23/30 to show decr fall risk.    Time  4    Period  Weeks    Status  New      PT LONG TERM GOAL #3   Title  Patient will improve gait velocity to >=2.62 ft/sec to demonstrate lesser fall risk in the community.     Time  4    Period  Weeks    Status  New      PT LONG TERM GOAL #4   Title  Patient will ambulate over outdoor level, paved surfaces modified independent with independent recovery if LOB occurs.     Time  4    Period  Weeks    Status  New            Plan - 06/04/18 1654    Clinical Impression Statement  Patient returned feeling better and had been doing his exercises 3x/day. Able to progress VORx1 to standing and added corner balance exercise with EC. Patient scored 18/30 on FGA (indicative of high fall risk) with greatest difficulty with walking eyes closed, walking with head turns, and tandem gait. Anticipate he will continue to make good progress, although neuropathy may be playing a role in the slower progress.     Rehab Potential  Good    PT Frequency  2x / week    PT Duration  4 weeks    PT Treatment/Interventions  ADLs/Self Care Home Management;Biofeedback;Canalith Repostioning;DME Instruction;Gait training;Stair training;Functional mobility training;Therapeutic exercise;Therapeutic activities;Balance training;Neuromuscular re-education;Patient/family education;Vestibular;Visual/perceptual remediation/compensation    PT Next Visit Plan  check pt's technique progress with VORx1; add balance exercises to HEP (has feet together, EC on solid floor) needs EO with head turns on foam, SLS?, ?walk with head turns with hand along wall    Consulted and Agree with Plan of Care  Patient;Family member/caregiver     Family Member Consulted  wife       Patient will benefit from skilled therapeutic intervention in order to improve the following deficits and impairments:  Decreased activity tolerance, Decreased balance, Decreased mobility, Decreased knowledge of use of DME, Decreased range of motion, Dizziness, Difficulty walking, Impaired sensation, Impaired vision/preception  Visit Diagnosis: Dizziness and giddiness  Unsteadiness on feet  Difficulty in walking, not elsewhere classified     Problem List Patient Active Problem List   Diagnosis Date Noted  . Chronic anticoagulation 04/14/2014  . Atrial flutter ([place])   . Hyperglycemia 03/11/2009  . SKIN CANCER, HX OF 03/11/2009  . Essential hypertension 09/17/2008  . Permanent atrial fibrillation ([place]) 09/17/2008  . Hyperlipidemia 11/01/2007  . PROSTATE CANCER, HX OF 01/18/2007  . Tubular adenoma of colon 01/12/2007    Rexanne Mano, PT 06/04/2018, 5:04 PM  [facility] [address] [place], [place], [address] Phone: [phone]   Fax:  [phone]  Name: MILBERN DOESCHER MRN: [id number] Date of Birth: [birth year]

## 2018-06-05 ENCOUNTER — Ambulatory Visit (INDEPENDENT_AMBULATORY_CARE_PROVIDER_SITE_OTHER): Payer: Medicare Other | Admitting: Family Medicine

## 2018-06-05 ENCOUNTER — Encounter: Payer: Self-pay | Admitting: Family Medicine

## 2018-06-05 VITALS — BP 135/82 | HR 59 | Temp 98.5°F | Resp 20 | Ht 74.0 in | Wt 208.2 lb

## 2018-06-05 DIAGNOSIS — Z7901 Long term (current) use of anticoagulants: Secondary | ICD-10-CM | POA: Diagnosis not present

## 2018-06-05 DIAGNOSIS — I4821 Permanent atrial fibrillation: Secondary | ICD-10-CM | POA: Diagnosis not present

## 2018-06-05 DIAGNOSIS — I483 Typical atrial flutter: Secondary | ICD-10-CM

## 2018-06-05 DIAGNOSIS — E119 Type 2 diabetes mellitus without complications: Secondary | ICD-10-CM

## 2018-06-05 DIAGNOSIS — E782 Mixed hyperlipidemia: Secondary | ICD-10-CM

## 2018-06-05 DIAGNOSIS — I10 Essential (primary) hypertension: Secondary | ICD-10-CM

## 2018-06-05 LAB — POCT GLYCOSYLATED HEMOGLOBIN (HGB A1C): HEMOGLOBIN A1C: 6.3 % — AB (ref 4.0–5.6)

## 2018-06-05 NOTE — Patient Instructions (Addendum)
It was nice to see you again today.   Your "prediabetes/diabetes" is well controlled with diet and exercise. Your a1c 6.3 now --dropped from 6.9, which is great. No meds needed at this time. We will continue to follow every 6 months with other conditions.   Your blood  Pressure is good. Watch the salt intake and keep up the exercise.   Follow up in 6 mos.       Diabetes Mellitus and Nutrition When you have diabetes (diabetes mellitus), it is very important to have healthy eating habits because your blood sugar (glucose) levels are greatly affected by what you eat and drink. Eating healthy foods in the appropriate amounts, at about the same times every day, can help you:  Control your blood glucose.  Lower your risk of heart disease.  Improve your blood pressure.  Reach or maintain a healthy weight.  Every person with diabetes is different, and each person has different needs for a meal plan. Your health care provider may recommend that you work with a diet and nutrition specialist (dietitian) to make a meal plan that is best for you. Your meal plan may vary depending on factors such as:  The calories you need.  The medicines you take.  Your weight.  Your blood glucose, blood pressure, and cholesterol levels.  Your activity level.  Other health conditions you have, such as heart or kidney disease.  How do carbohydrates affect me? Carbohydrates affect your blood glucose level more than any other type of food. Eating carbohydrates naturally increases the amount of glucose in your blood. Carbohydrate counting is a method for keeping track of how many carbohydrates you eat. Counting carbohydrates is important to keep your blood glucose at a healthy level, especially if you use insulin or take certain oral diabetes medicines. It is important to know how many carbohydrates you can safely have in each meal. This is different for every person. Your dietitian can help you calculate how  many carbohydrates you should have at each meal and for snack. Foods that contain carbohydrates include:  Bread, cereal, rice, pasta, and crackers.  Potatoes and corn.  Peas, beans, and lentils.  Milk and yogurt.  Fruit and juice.  Desserts, such as cakes, cookies, ice cream, and candy.  How does alcohol affect me? Alcohol can cause a sudden decrease in blood glucose (hypoglycemia), especially if you use insulin or take certain oral diabetes medicines. Hypoglycemia can be a life-threatening condition. Symptoms of hypoglycemia (sleepiness, dizziness, and confusion) are similar to symptoms of having too much alcohol. If your health care provider says that alcohol is safe for you, follow these guidelines:  Limit alcohol intake to no more than 1 drink per day for nonpregnant women and 2 drinks per day for men. One drink equals 12 oz of beer, 5 oz of wine, or 1 oz of hard liquor.  Do not drink on an empty stomach.  Keep yourself hydrated with water, diet soda, or unsweetened iced tea.  Keep in mind that regular soda, juice, and other mixers may contain a lot of sugar and must be counted as carbohydrates.  What are tips for following this plan? Reading food labels  Start by checking the serving size on the label. The amount of calories, carbohydrates, fats, and other nutrients listed on the label are based on one serving of the food. Many foods contain more than one serving per package.  Check the total grams (g) of carbohydrates in one serving. You can calculate  the number of servings of carbohydrates in one serving by dividing the total carbohydrates by 15. For example, if a food has 30 g of total carbohydrates, it would be equal to 2 servings of carbohydrates.  Check the number of grams (g) of saturated and trans fats in one serving. Choose foods that have low or no amount of these fats.  Check the number of milligrams (mg) of sodium in one serving. Most people should limit total  sodium intake to less than 2,300 mg per day.  Always check the nutrition information of foods labeled as "low-fat" or "nonfat". These foods may be higher in added sugar or refined carbohydrates and should be avoided.  Talk to your dietitian to identify your daily goals for nutrients listed on the label. Shopping  Avoid buying canned, premade, or processed foods. These foods tend to be high in fat, sodium, and added sugar.  Shop around the outside edge of the grocery store. This includes fresh fruits and vegetables, bulk grains, fresh meats, and fresh dairy. Cooking  Use low-heat cooking methods, such as baking, instead of high-heat cooking methods like deep frying.  Cook using healthy oils, such as olive, canola, or sunflower oil.  Avoid cooking with butter, cream, or high-fat meats. Meal planning  Eat meals and snacks regularly, preferably at the same times every day. Avoid going long periods of time without eating.  Eat foods high in fiber, such as fresh fruits, vegetables, beans, and whole grains. Talk to your dietitian about how many servings of carbohydrates you can eat at each meal.  Eat 4-6 ounces of lean protein each day, such as lean meat, chicken, fish, eggs, or tofu. 1 ounce is equal to 1 ounce of meat, chicken, or fish, 1 egg, or 1/4 cup of tofu.  Eat some foods each day that contain healthy fats, such as avocado, nuts, seeds, and fish. Lifestyle   Check your blood glucose regularly.  Exercise at least 30 minutes 5 or more days each week, or as told by your health care provider.  Take medicines as told by your health care provider.  Do not use any products that contain nicotine or tobacco, such as cigarettes and e-cigarettes. If you need help quitting, ask your health care provider.  Work with a Social worker or diabetes educator to identify strategies to manage stress and any emotional and social challenges. What are some questions to ask my health care provider?  Do  I need to meet with a diabetes educator?  Do I need to meet with a dietitian?  What number can I call if I have questions?  When are the best times to check my blood glucose? Where to find more information:  American Diabetes Association: diabetes.org/food-and-fitness/food  Academy of Nutrition and Dietetics: PokerClues.dk  [employer] of Diabetes and Digestive and Kidney Diseases (NIH): ContactWire.be Summary  A healthy meal plan will help you control your blood glucose and maintain a healthy lifestyle.  Working with a diet and nutrition specialist (dietitian) can help you make a meal plan that is best for you.  Keep in mind that carbohydrates and alcohol have immediate effects on your blood glucose levels. It is important to count carbohydrates and to use alcohol carefully. This information is not intended to replace advice given to you by your health care provider. Make sure you discuss any questions you have with your health care provider. Document Released: 05/19/2005 Document Revised: 09/26/2016 Document Reviewed: 09/26/2016 Elsevier Interactive Patient Education  [employer].  Please help [place] help you:  We are honored you have chosen [facility] for your Primary Care home. Below you will find basic instructions that you may need to access in the future. Please help [place] help you by reading the instructions, which cover many of the frequent questions we experience.   Prescription refills and request:  -In order to allow more efficient response time, please call your pharmacy for all refills. They will forward the request electronically to [place]. This allows for the quickest possible response. Request left on a nurse line can take longer to refill, since these are checked as time allows between office patients and other phone calls.  - refill  request can take up to 3-5 working days to complete.  - If request is sent electronically and request is appropiate, it is usually completed in 1-2 business days.  - all patients will need to be seen routinely for all chronic medical conditions requiring prescription medications (see follow-up below). If you are overdue for follow up on your condition, you will be asked to make an appointment and we will call in enough medication to cover you until your appointment (up to 30 days).  - all controlled substances will require a face to face visit to request/refill.  - if you desire your prescriptions to go through a new pharmacy, and have an active script at original pharmacy, you will need to call your pharmacy and have scripts transferred to new pharmacy. This is completed between the pharmacy locations and not by your provider.    Results: If any images or labs were ordered, it can take up to 1 week to get results depending on the test ordered and the lab/facility running and resulting the test. - Normal or stable results, which do not need further discussion, may be released to your mychart immediately with attached note to you. A call may not be generated for normal results. Please make certain to sign up for mychart. If you have questions on how to activate your mychart you can call the front office.  - If your results need further discussion, our office will attempt to contact you via phone, and if unable to reach you after 2 attempts, we will release your abnormal result to your mychart with instructions.  - All results will be automatically released in mychart after 1 week.  - Your provider will provide you with explanation and instruction on all relevant material in your results. Please keep in mind, results and labs may appear confusing or abnormal to the untrained eye, but it does not mean they are actually abnormal for you personally. If you have any questions about your results that are not  covered, or you desire more detailed explanation than what was provided, you should make an appointment with your provider to do so.   Our office handles many outgoing and incoming calls daily. If we have not contacted you within 1 week about your results, please check your mychart to see if there is a message first and if not, then contact our office.  In helping with this matter, you help decrease call volume, and therefore allow [place] to be able to respond to patients needs more efficiently.   Acute office visits (sick visit):  An acute visit is intended for a new problem and are scheduled in shorter time slots to allow schedule openings for patients with new problems. This is the appropriate visit to discuss a new problem. Problems will not be addressed  by phone call or [employer]. Appointment is needed if requesting treatment. In order to provide you with excellent quality medical care with proper time for you to explain your problem, have an exam and receive treatment with instructions, these appointments should be limited to one new problem per visit. If you experience a new problem, in which you desire to be addressed, please make an acute office visit, we save openings on the schedule to accommodate you. Please do not save your new problem for any other type of visit, let us take care of it properly and quickly for you.   Follow up visits:  Depending on your condition(s) your provider will need to see you routinely in order to provide you with quality care and prescribe medication(s). Most chronic conditions (Example: hypertension, Diabetes, depression/anxiety... etc), require visits a couple times a year. Your provider will instruct you on proper follow up for your personal medical conditions and history. Please make certain to make follow up appointments for your condition as instructed. Failing to do so could result in lapse in your medication treatment/refills. If you request a refill, and are  overdue to be seen on a condition, we will always provide you with a 30 day script (once) to allow you time to schedule.    Medicare wellness (well visit): - we have a wonderful Nurse Maudie Mercury), that will meet with you and provide you will yearly medicare wellness visits. These visits should occur yearly (can not be scheduled less than 1 calendar year apart) and cover preventive health, immunizations, advance directives and screenings you are entitled to yearly through your medicare benefits. Do not miss out on your entitled benefits, this is when medicare will pay for these benefits to be ordered for you.  These are strongly encouraged by your provider and is the appropriate type of visit to make certain you are up to date with all preventive health benefits. If you have not had your medicare wellness exam in the last 12 months, please make certain to schedule one by calling the office and schedule your medicare wellness with Maudie Mercury as soon as possible.   Yearly physical (well visit):  - Adults are recommended to be seen yearly for physicals. Check with your insurance and date of your last physical, most insurances require one calendar year between physicals. Physicals include all preventive health topics, screenings, medical exam and labs that are appropriate for gender/age and history. You may have fasting labs needed at this visit. This is a well visit (not a sick visit), new problems should not be covered during this visit (see acute visit).  - Pediatric patients are seen more frequently when they are younger. Your provider will advise you on well child visit timing that is appropriate for your their age. - This is not a medicare wellness visit. Medicare wellness exams do not have an exam portion to the visit. Some medicare companies allow for a physical, some do not allow a yearly physical. If your medicare allows a yearly physical you can schedule the medicare wellness with our nurse Maudie Mercury and have your  physical with your provider after, on the same day. Please check with insurance for your full benefits.   Late Policy/No Shows:  - all new patients should arrive 15-30 minutes earlier than appointment to allow [place] time  to  obtain all personal demographics,  insurance information and for you to complete office paperwork. - All established patients should arrive 10-15 minutes earlier than appointment time to update  all information and be checked in .  - In our best efforts to run on time, if you are late for your appointment you will be asked to either reschedule or if able, we will work you back into the schedule. There will be a wait time to work you back in the schedule,  depending on availability.  - If you are unable to make it to your appointment as scheduled, please call 24 hours ahead of time to allow [place] to fill the time slot with someone else who needs to be seen. If you do not cancel your appointment ahead of time, you may be charged a no show fee.

## 2018-06-05 NOTE — Progress Notes (Signed)
Patient ID: Mike Day, male  DOB: Jun 04, 1946, 72 y.o.   MRN: [id number] Patient Care Team    Relationship Specialty Notifications Start End  McGowen, Adrian Blackwater, MD PCP - General Family Medicine  05/17/18   Rod Can, MD Consulting Physician Orthopedic Surgery  10/03/15   Sharmon Revere Physician Assistant Cardiology  04/29/16   Rolm Bookbinder, MD Consulting Physician Dermatology  08/24/17     Chief Complaint  Patient presents with  . Transfer of Care    Subjective:  Mike Day is a 72 y.o.  male present for new patient establishment (TOC). All past medical history, surgical history, allergies, family history, immunizations, medications and social history were updated in the electronic medical record today. All recent labs, ED visits and hospitalizations within the last year were reviewed.  Vertigo:  Patient has been attending vestibular rehab and see some improvement.  MRI is without acute intracranial abnormality to cause vertigo.  He was able to go on his trip and did not have many symptoms. There is no "normal" length vertigo lasts, can be minutes, days or a few weeks in some people. If it continues after PT, then We would need to see him back.  The actual results are below. #2, cerebral atrophy- is usually age related and so "chronic small ischemic disease"--> maintaining good control of sugar and blood pressure, and continuing his simvastatin is only treatment for these.  MRI: 1. No acute intracranial abnormality. 2. Generalized cerebral atrophy with mild to moderate chronic small vessel ischemic disease   HTN/HLD/A.fib: Pt reports compliance with loaded pain 5 mg daily, lisinopril 40 mg daily, Zocor 20 mg daily, Eliquis 5 mg twice daily. Blood pressures ranges at home are w/in normal. Patient denies chest pain, shortness of breath or lower extremity edema.  Pt is  prescribed statin. BMP: 05/15/2018 within normal limits, GFR 78 CBC: 05/15/2018 within normal  limits TSH: 05/15/2018 1.66 Diet: Low-sodium Lipids: 02/06/2018 total cholesterol 119, HDL 43, LDL 65, triglycerides 55 Exercise: Routine RF: Hypertension, hyperlipidemia, arrhythmia, family history heart disease and stroke, murmur smoker quit in 2009, Dr. Saunders Revel prescribes all meds.   Diabetes (diet controlled): Patient has been diet controlled for his diabetes and never medicated.  He denies numbness, tingling of extremities, hypo/hyperglycemic events or non-healing wounds.  He has been watching his diet and refraining from sugar. PNA series: Completed 2016 Flu shot: UTD 2019 (recommneded yearly) Foot exam: Completed 08/24/2017 Eye exam: Completed 05/15/2018 A1c: 6.9--> 6.3  Depression screen [facility] 2/9 06/05/2018 08/24/2017 06/05/2015 07/02/2013  Decreased Interest 0 0 0 0  Down, Depressed, Hopeless 0 0 0 0  PHQ - 2 Score 0 0 0 0   No flowsheet data found.  Current Exercise Habits: Home exercise routine, Time (Minutes): 45, Frequency (Times/Week): 7, Weekly Exercise (Minutes/Week): 315, Intensity: Mild Exercise limited by: None identified Fall Risk  06/05/2018 08/24/2017 08/12/2016 06/05/2015 07/02/2013  Falls in the past year? Yes Yes No Yes No  Comment - - Emmi Telephone Survey: data to providers prior to load - -  Number falls in past yr: 2 or more 1 - - -  Injury with Fall? No No - No -  Risk for fall due to : Other (Comment) - - History of fall(s) -  Risk for fall due to: Comment vertigo - - - -  Follow up Falls prevention discussed;Falls evaluation completed Falls prevention discussed - - -     Immunization History  Administered Date(s) Administered  .  Hep A / Hep B 08/04/2009  . Influenza Split 06/15/2011, 06/05/2012  . Influenza Whole 07/10/2007, 06/12/2008, 06/05/2009, 05/19/2010  . Influenza, High Dose Seasonal PF 06/01/2016, 05/11/2017, 06/04/2018  . Influenza,inj,Quad PF,6+ Mos 05/29/2013, 05/06/2014, 05/14/2015  . Pneumococcal Conjugate-13 07/03/2014, 07/07/2015  .  Pneumococcal Polysaccharide-23 06/08/2012  . Td 09/06/1999  . Tdap 12/06/2010  . Zoster 09/07/2009    No exam data present  Past Medical History:  Diagnosis Date  . Anemia    Early 20's  . Atrial flutter ([place]) 2008   Status post RFCA Dr Cristopher Peru  . Chronic atrial fibrillation    Eliquis  . Diabetes mellitus without complication ([place])    Dx by A1c criteria 2014  . History of echocardiogram    Echo 12/14Severe basal septal hypertrophy, moderate LVH, EF 60-65%, moderate LAE, mild RAE   . Hx of adenomatous colonic polyps   . Hyperlipidemia   . Hypertension   . Prostate cancer [facility]) 2006   Prostatectomy   Allergies  Allergen Reactions  . Levofloxacin Rash  . Tape Other (See Comments)    ADHESIVE TAPE; BLISTERS,  . Neosporin [Neomycin-Bacitracin Zn-Polymyx] Other (See Comments)    Dry/scaly skin   Past Surgical History:  Procedure Laterality Date  . COLONOSCOPY W/ POLYPECTOMY  2003;2010;04/2014   Tubular adenoma: recall 5 yrs (after 04/2019)  . HERNIA REPAIR  07/2008   Dr.Martin  . MOHS SURGERY     left hand  . 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 RADIOFREQUENCY ABLATION  01/15/2007   for ectopic atrial foci  . SEPTOPLASTY  Age 47  . TRANSTHORACIC ECHOCARDIOGRAM  09/02/2013   Normal.  EF 60%.  LAE and RAE.   Family History  Problem Relation Age of Onset  . Lung disease Father        Black Lung  . Heart disease Father        Congenital  . Emphysema Father   . COPD Father   . Diabetes Mother   . Stroke Mother 69  . Hypertension Brother   . Heart disease Brother   . Hyperlipidemia Brother   . Heart attack Brother 11       sudden death  . Cancer Neg Hx    Social History   Socioeconomic History  . Marital status: Married    Spouse name: Not on file  . Number of children: Not on file  . Years of education: Not on file  . Highest education level: Not on file  Occupational History  . Not on file  Social Needs  . Financial  resource strain: Not on file  . Food insecurity:    Worry: Not on file    Inability: Not on file  . Transportation needs:    Medical: Not on file    Non-medical: Not on file  Tobacco Use  . Smoking status: Former Smoker    Last attempt to quit: 09/06/2007    Years since quitting: 10.7  . Smokeless tobacco: Never Used  . Tobacco comment: smoked 1963 -2009; total 5 packs of cigarettes in life . Cigars < 1 / day on average 1992-2009  Substance and Sexual Activity  . Alcohol use: Yes    Alcohol/week: 7.0 standard drinks    Types: 7 Glasses of wine per week    Comment: Socially  . Drug use: No  . Sexual activity: Yes    Partners: Female  Lifestyle  . Physical activity:    Days per  week: Not on file    Minutes per session: Not on file  . Stress: Not on file  Relationships  . Social connections:    Talks on phone: Not on file    Gets together: Not on file    Attends religious service: Not on file    Active member of club or organization: Not on file    Attends meetings of clubs or organizations: Not on file    Relationship status: Not on file  . Intimate partner violence:    Fear of current or ex partner: Not on file    Emotionally abused: Not on file    Physically abused: Not on file    Forced sexual activity: Not on file  Other Topics Concern  . Not on file  Social History Narrative   Married, 1 son, 2 grand-daughters.   Educ: Masters degree   Occup: retired from [employer].   No tobacco.   Alcohol: 1 glass red wine per night.   Was once a long distance runner.   Allergies as of 06/05/2018      Reactions   Levofloxacin Rash   Tape Other (See Comments)   ADHESIVE TAPE; BLISTERS,   Neosporin [neomycin-bacitracin Zn-polymyx] Other (See Comments)   Dry/scaly skin      Medication List        Accurate as of 06/05/18 11:59 PM. Always use your most recent med list.          acetaminophen 500 MG tablet Commonly known as:  TYLENOL Take 1,000 mg by mouth every 6  (six) hours as needed for mild pain or headache.   amLODipine 5 MG tablet Commonly known as:  NORVASC Take 1 tablet (5 mg total) by mouth daily. Please keep upcoming appt in October for future refills. Thank you   apixaban 5 MG Tabs tablet Commonly known as:  ELIQUIS Take 1 tablet (5 mg total) by mouth 2 (two) times daily.   lisinopril 40 MG tablet Commonly known as:  PRINIVIL,ZESTRIL Take 1 tablet (40 mg total) by mouth daily. Please keep upcoming appt in October for future refills. Thank you   simvastatin 20 MG tablet Commonly known as:  ZOCOR Take 1 tablet (20 mg total) by mouth at bedtime. Please keep upcoming appt in October for future refills. Thank you       All past medical history, surgical history, allergies, family history, immunizations andmedications were updated in the EMR today and reviewed under the history and medication portions of their EMR.    Recent Results (from the past 2160 hour(s))  Comp Met (CMET)     Status: None   Collection Time: 05/15/18  2:16 PM  Result Value Ref Range   Sodium 135 135 - 145 mEq/L   Potassium 4.1 3.5 - 5.1 mEq/L   Chloride 97 96 - 112 mEq/L   CO2 31 19 - 32 mEq/L   Glucose, Bld 97 70 - 99 mg/dL   BUN 14 6 - 23 mg/dL   Creatinine, Ser 1.00 0.40 - 1.50 mg/dL   Total Bilirubin 1.0 0.2 - 1.2 mg/dL   Alkaline Phosphatase 54 39 - 117 U/L   AST 13 0 - 37 U/L   ALT 14 0 - 53 U/L   Total Protein 7.3 6.0 - 8.3 g/dL   Albumin 4.4 3.5 - 5.2 g/dL   Calcium 10.1 8.4 - 10.5 mg/dL   GFR 78.05 >60.00 mL/min  TSH     Status: None   Collection Time: 05/15/18  2:16 PM  Result Value Ref Range   TSH 1.66 0.35 - 4.50 uIU/mL  CBC w/Diff     Status: None   Collection Time: 05/15/18  2:16 PM  Result Value Ref Range   WBC 7.0 4.0 - 10.5 K/uL   RBC 4.68 4.22 - 5.81 Mil/uL   Hemoglobin 15.9 13.0 - 17.0 g/dL   HCT 45.3 39.0 - 52.0 %   MCV 96.8 78.0 - 100.0 fl   MCHC 35.0 30.0 - 36.0 g/dL   RDW 13.1 11.5 - 15.5 %   Platelets 207.0 150.0 - 400.0  K/uL   Neutrophils Relative % 72.6 43.0 - 77.0 %   Lymphocytes Relative 17.6 12.0 - 46.0 %   Monocytes Relative 8.4 3.0 - 12.0 %   Eosinophils Relative 1.1 0.0 - 5.0 %   Basophils Relative 0.3 0.0 - 3.0 %   Neutro Abs 5.1 1.4 - 7.7 K/uL   Lymphs Abs 1.2 0.7 - 4.0 K/uL   Monocytes Absolute 0.6 0.1 - 1.0 K/uL   Eosinophils Absolute 0.1 0.0 - 0.7 K/uL   Basophils Absolute 0.0 0.0 - 0.1 K/uL  Urinalysis, Routine w reflex microscopic     Status: Abnormal   Collection Time: 05/16/18  7:46 PM  Result Value Ref Range   Color, Urine YELLOW YELLOW   APPearance CLEAR CLEAR   Specific Gravity, Urine 1.013 1.005 - 1.030   pH 5.0 5.0 - 8.0   Glucose, UA NEGATIVE NEGATIVE mg/dL   Hgb urine dipstick SMALL (A) NEGATIVE   Bilirubin Urine NEGATIVE NEGATIVE   Ketones, ur NEGATIVE NEGATIVE mg/dL   Protein, ur NEGATIVE NEGATIVE mg/dL   Nitrite NEGATIVE NEGATIVE   Leukocytes, UA NEGATIVE NEGATIVE   RBC / HPF 0-5 0 - 5 RBC/hpf   WBC, UA 0-5 0 - 5 WBC/hpf   Bacteria, UA NONE SEEN NONE SEEN   Squamous Epithelial / LPF 0-5 0 - 5   Mucus PRESENT     Comment: Performed at [facility], [place] [address], [place], [place] [address]  CBG monitoring, ED     Status: Abnormal   Collection Time: 05/16/18  8:49 PM  Result Value Ref Range   Glucose-Capillary 113 (H) 70 - 99 mg/dL   Comment 1 Notify RN    Comment 2 Document in Chart   POCT glycosylated hemoglobin (Hb A1C)     Status: Abnormal   Collection Time: 06/05/18  9:17 AM  Result Value Ref Range   Hemoglobin A1C 6.3 (A) 4.0 - 5.6 %   HbA1c POC (<> result, manual entry)     HbA1c, POC (prediabetic range)     HbA1c, POC (controlled diabetic range)      Mr Brain Wo Contrast  Result Date: 05/16/2018 CLINICAL DATA:  Initial evaluation for acute dizziness, ataxia. EXAM: MRI HEAD WITHOUT CONTRAST TECHNIQUE: Multiplanar, multiecho pulse sequences of the brain and surrounding structures were obtained without intravenous contrast. COMPARISON:   None. FINDINGS: Brain: Moderate cerebral atrophy with mild to moderate chronic small vessel ischemic disease. No acute intracranial infarct. No areas of chronic cortical infarction. No acute or chronic intracranial hemorrhage. No mass lesion, midline shift or mass effect. No hydrocephalus. No extra-axial fluid collection. 2 cherry gland normal. Vascular: Major intracranial vascular flow voids maintained Skull and upper cervical spine: Craniocervical junction normal. No focal marrow replacing lesion. Scalp soft tissues unremarkable. Sinuses/Orbits: Globes and orbital soft tissues within normal limits. Paranasal sinuses are clear. Small bilateral mastoid effusions, of doubtful significance. Other: None. IMPRESSION: 1.  No acute intracranial abnormality. 2. Generalized cerebral atrophy with mild to moderate chronic small vessel ischemic disease. Electronically Signed   By: Jeannine Boga M.D.   On: 05/16/2018 22:03     ROS: 14 pt review of systems performed and negative (unless mentioned in an HPI)  Objective: BP 135/82 (BP Location: Right Arm, Patient Position: Sitting, Cuff Size: Large)   Pulse (!) 59   Temp 98.5 F (36.9 C)   Resp 20   Ht '6\' 2"'  (1.88 m)   Wt 208 lb 4 oz (94.5 kg)   SpO2 97%   BMI 26.74 kg/m  Gen: Afebrile. No acute distress. Nontoxic in appearance, well-developed, well-nourished, pleasant Caucasian male HENT: AT. [place]. Bilateral TM visualized and normal in appearance, normal external auditory canal. MMM, no oral lesions, adequate dentition.  No cough on exam, no hoarseness on exam. Eyes:Pupils Equal Round Reactive to light, Extraocular movements intact,  Conjunctiva without redness, discharge or icterus. Neck/lymp/endocrine: Supple, no lymphadenopathy, no thyromegaly CV: RRR, no murmur, no edema, +2/4 P posterior tibialis pulses.  Chest: CTAB, no wheeze, rhonchi or crackles. Skin: Warm and well-perfused. Skin intact. Neuro/Msk:  Normal gait. PERLA. EOMi. Alert. Oriented  x3.   Psych: Normal affect, dress and demeanor. Normal speech. Normal thought content and judgment.   Assessment/plan: Mike Day is a 72 y.o. male present for TOC.  Diabetes mellitus without complication ([place]) Well-controlled with diet and exercise.  A1c improved from 6.9-->6.3 today. PNA series: Completed 2016 Flu shot: UTD 2019 (recommneded yearly) Foot exam: Completed 08/24/2017 Eye exam: Completed 05/15/2018 A1c: 6.9--> 6.3 - POCT glycosylated hemoglobin (Hb A1C) -Follow-up 6 months  Essential hypertension/Mixed hyperlipidemia/Permanent atrial fibrillation/Typical atrial flutter (HCC)/Chronic anticoagulation - stable. Well controlled on current regimen.  No changes, continue cardiology follow ups, which provide medication.  -Continue Norvasc 5 mg daily, Eliquis 5 mg twice daily, lisinopril 40 mg daily, Zocor 20 mg daily On statin and chronic anticoag.  BMP: 05/15/2018 within normal limits, GFR 78 CBC: 05/15/2018 within normal limits TSH: 05/15/2018 1.66 Diet: Low-sodium Lipids: 02/06/2018 total cholesterol 119, HDL 43, LDL 65, triglycerides 55 Exercise: Routine RF: Hypertension, hyperlipidemia, arrhythmia, family history heart disease and stroke, murmur smoker quit in 2009, Dr. Kathalene Frames prescribes all meds.   Return in about 6 months (around 12/05/2018) for HTN/prediab.   Note is dictated utilizing voice recognition software. Although note has been proof read prior to signing, occasional typographical errors still can be missed. If any questions arise, please do not hesitate to call for verification.  Electronically signed by: Howard Pouch, DO [place]

## 2018-06-07 ENCOUNTER — Ambulatory Visit: Payer: Medicare Other | Attending: Family Medicine | Admitting: Physical Therapy

## 2018-06-07 ENCOUNTER — Encounter: Payer: Self-pay | Admitting: Physical Therapy

## 2018-06-07 DIAGNOSIS — R42 Dizziness and giddiness: Secondary | ICD-10-CM

## 2018-06-07 DIAGNOSIS — R262 Difficulty in walking, not elsewhere classified: Secondary | ICD-10-CM

## 2018-06-07 DIAGNOSIS — R2681 Unsteadiness on feet: Secondary | ICD-10-CM

## 2018-06-07 NOTE — Patient Instructions (Signed)
  Do your "A" exercise with your feet together. 30-45 seconds.  Hip Side Kick    Holding the bathroom counter and looking in the mirror, keep legs shoulder width apart and toes and knee caps pointed forward. Lift a leg out to side, keeping knee straight. Do not lean. Hold for 3 seconds. Repeat using other leg. Alternate legs for 20 reps. Do __1__ sessions per day.

## 2018-06-08 ENCOUNTER — Ambulatory Visit: Payer: Medicare Other | Admitting: Physician Assistant

## 2018-06-08 NOTE — Therapy (Signed)
[facility] [address] [place], [place], [address] Phone: [phone]   Fax:  [phone]  Physical Therapy Treatment  Patient Details  Name: Mike Day MRN: [id number] Date of Birth: 01-03-1946 Referring Provider (PT): Ma Hillock, DO   Encounter Date: 06/07/2018  PT End of Session - 06/08/18 1540    Visit Number  3    Number of Visits  9    Date for PT Re-Evaluation  06/21/18    Authorization Type  Medicare A/B    Authorization Time Period  05/22/18 to 08/20/2018    PT Start Time  0845    PT Stop Time  0934    PT Time Calculation (min)  49 min    Equipment Utilized During Treatment  --    Activity Tolerance  Patient tolerated treatment well    Behavior During Therapy  [facility] for tasks assessed/performed       Past Medical History:  Diagnosis Date  . Anemia    Early 20's  . Atrial flutter ([place]) 2008   Status post RFCA Dr Cristopher Peru  . Chronic atrial fibrillation    Eliquis  . Diabetes mellitus without complication ([place])    Dx by A1c criteria 2014  . History of echocardiogram    Echo 12/14Severe basal septal hypertrophy, moderate LVH, EF 60-65%, moderate LAE, mild RAE   . Hx of adenomatous colonic polyps   . Hyperlipidemia   . Hypertension   . Prostate cancer [facility]) 2006   Prostatectomy    Past Surgical History:  Procedure Laterality Date  . COLONOSCOPY W/ POLYPECTOMY  2003;2010;04/2014   Tubular adenoma: recall 5 yrs (after 04/2019)  . HERNIA REPAIR  07/2008   Dr.Martin  . MOHS SURGERY     left hand  . 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 RADIOFREQUENCY ABLATION  01/15/2007   for ectopic atrial foci  . SEPTOPLASTY  Age 7  . TRANSTHORACIC ECHOCARDIOGRAM  09/02/2013   Normal.  EF 60%.  LAE and RAE.    There were no vitals filed for this visit.  Subjective Assessment - 06/07/18 0845    Subjective  yesterday was a really good day and today is feeling dizzy.  Yesterday was able to bend over and trim shrubs and come back up. Able to stand with EC 30 sec and not touch walls. Began to move head with EC and much harder.     Pertinent History  a-fib chronic anti-coag; DM; HLD, HTN, prostate Ca    Patient Stated Goals  Be able to go on his trip tomorrow to [facility], [place]; to be able to get back to his usual level of activity "I'll never    Currently in Pain?  No/denies                       [facility] Adult PT Treatment/Exercise - 06/07/18 1700      Exercises   Exercises  Knee/Hip      Knee/Hip Exercises: Standing   Hip Abduction  Stengthening;Both;2 sets;10 reps;Knee straight   3 sec hold; minimize UE support for balance challenge     Vestibular Treatment/Exercise - 06/07/18 1700      Vestibular Treatment/Exercise   Vestibular Treatment Provided  Gaze    Gaze Exercises  X1 Viewing Horizontal;X1 Viewing Vertical      X1 Viewing Horizontal   Foot Position  feet apart, feet together    Time  --  30 sec   Reps  5    Comments  vc for ROM, chin tuck, speed & when to stop      X1 Viewing Vertical   Foot Position  feet apart, feet together    Time  --   30 sec   Reps  2         Balance Exercises - 06/07/18 1700      Balance Exercises: Standing   SLS  Eyes open;Upper extremity support 1;Upper extremity support 2;10 secs;5 reps   vc, assist for upright wt-shift over BOS; 5 each leg   Balance Beam  blue, crosswise; hip strategy and recover; step off/on anterior/posterior with recovery using ankle and hip strategy    Gait with Head Turns  Forward  Horizontal more difficult than vertical  200 feet       PT Education - 06/07/18 1700    Education Details  updated HEP    Person(s) Educated  Patient    Methods  Explanation;Demonstration;Tactile cues;Verbal cues;Handout    Comprehension  Verbalized understanding;Returned demonstration;Verbal cues required;Tactile cues required;Need further instruction          PT Long Term  Goals - 05/22/18 2131      PT LONG TERM GOAL #1   Title  Patient will be independent with HEp (Target all LTGs 06/21/2018)    Time  4    Period  Weeks    Status  New      PT LONG TERM GOAL #2   Title  Patient will improve FGA score to >=23/30 to show decr fall risk.    Time  4    Period  Weeks    Status  New      PT LONG TERM GOAL #3   Title  Patient will improve gait velocity to >=2.62 ft/sec to demonstrate lesser fall risk in the community.     Time  4    Period  Weeks    Status  New      PT LONG TERM GOAL #4   Title  Patient will ambulate over outdoor level, paved surfaces modified independent with independent recovery if LOB occurs.     Time  4    Period  Weeks    Status  New            Plan - 06/08/18 4332    Clinical Impression Statement  Patient continues to diligently do his HEP and improve. Focused on adding balance exercise to his HEP and assure correct technique with VORx1. Patient can continue to benefit from PT    Rehab Potential  Good    PT Frequency  2x / week    PT Duration  4 weeks    PT Treatment/Interventions  ADLs/Self Care Home Management;Biofeedback;Canalith Repostioning;DME Instruction;Gait training;Stair training;Functional mobility training;Therapeutic exercise;Therapeutic activities;Balance training;Neuromuscular re-education;Patient/family education;Vestibular;Visual/perceptual remediation/compensation    PT Next Visit Plan  check pt's technique progress with VORx1; walk with head turns and/or searching for items; ? add walk with hand along wall with head turns to HEP; balance activities to stimulate inner ear    PT Home Exercise Plan  VORx1 feet together; feet together, EC on solid floor; std hp abdct 3 sec hold;     Consulted and Agree with Plan of Care  Patient;Family member/caregiver    Family Member Consulted  wife       Patient will benefit from skilled therapeutic intervention in order to improve the following deficits and impairments:   Decreased activity tolerance, Decreased  balance, Decreased mobility, Decreased knowledge of use of DME, Decreased range of motion, Dizziness, Difficulty walking, Impaired sensation, Impaired vision/preception  Visit Diagnosis: Dizziness and giddiness  Unsteadiness on feet  Difficulty in walking, not elsewhere classified     Problem List Patient Active Problem List   Diagnosis Date Noted  . Chronic anticoagulation 04/14/2014  . Atrial flutter ([place])   . Prediabetes 03/11/2009  . SKIN CANCER, HX OF 03/11/2009  . Essential hypertension 09/17/2008  . Permanent atrial fibrillation 09/17/2008  . Hyperlipidemia 11/01/2007  . PROSTATE CANCER, HX OF 01/18/2007  . Tubular adenoma of colon 01/12/2007    Rexanne Mano , PT 06/08/2018, 6:54 AM  [facility] [address] [place], [place], [address] Phone: [phone]   Fax:  [phone]  Name: JEANETTE MOFFATT MRN: [id number] Date of Birth: 09/08/1945

## 2018-06-11 ENCOUNTER — Encounter: Payer: Self-pay | Admitting: Physical Therapy

## 2018-06-11 ENCOUNTER — Ambulatory Visit: Payer: Medicare Other | Admitting: Physical Therapy

## 2018-06-11 ENCOUNTER — Encounter: Payer: Self-pay | Admitting: Family Medicine

## 2018-06-11 DIAGNOSIS — R42 Dizziness and giddiness: Secondary | ICD-10-CM

## 2018-06-11 DIAGNOSIS — R262 Difficulty in walking, not elsewhere classified: Secondary | ICD-10-CM | POA: Diagnosis not present

## 2018-06-11 DIAGNOSIS — R2681 Unsteadiness on feet: Secondary | ICD-10-CM | POA: Diagnosis not present

## 2018-06-11 NOTE — Patient Instructions (Signed)
Access Code: [email or web address]  URL: [email or web address]  Date: 06/11/2018  Prepared by: Barry Brunner   Exercises  Half Tandem Stance Balance with Head Rotation - 2 reps - 1 sets - 3x daily - 7x weekly   (VORx1 on busy background)

## 2018-06-11 NOTE — Therapy (Signed)
[place] [address] [place], [place], [address] Phone: [phone]   Fax:  [phone]  Physical Therapy Treatment  Patient Details  Name: Mike Day MRN: [id number] Date of Birth: 10/25/1945 Referring Provider (PT): Ma Hillock, DO   Encounter Date: 06/11/2018  PT End of Session - 06/11/18 0942    Visit Number  4    Number of Visits  9    Date for PT Re-Evaluation  06/21/18    Authorization Type  Medicare A/B    Authorization Time Period  05/22/18 to 08/20/2018    PT Start Time  0848    PT Stop Time  0936    PT Time Calculation (min)  48 min    Activity Tolerance  Patient tolerated treatment well    Behavior During Therapy  [facility] for tasks assessed/performed       Past Medical History:  Diagnosis Date  . Anemia    Early 20's  . Atrial flutter ([place]) 2008   Status post RFCA Dr Cristopher Peru  . Chronic atrial fibrillation    Eliquis  . Diabetes mellitus without complication ([place])    Dx by A1c criteria 2014  . History of echocardiogram    Echo 12/14Severe basal septal hypertrophy, moderate LVH, EF 60-65%, moderate LAE, mild RAE   . Hx of adenomatous colonic polyps   . Hyperlipidemia   . Hypertension   . Prostate cancer [facility]) 2006   Prostatectomy    Past Surgical History:  Procedure Laterality Date  . COLONOSCOPY W/ POLYPECTOMY  2003;2010;04/2014   Tubular adenoma: recall 5 yrs (after 04/2019)  . HERNIA REPAIR  07/2008   Dr.Martin  . MOHS SURGERY     left hand  . 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 RADIOFREQUENCY ABLATION  01/15/2007   for ectopic atrial foci  . SEPTOPLASTY  Age 44  . TRANSTHORACIC ECHOCARDIOGRAM  09/02/2013   Normal.  EF 60%.  LAE and RAE.    There were no vitals filed for this visit.  Subjective Assessment - 06/11/18 0850    Subjective  Worked in the yard a lot yesterday. Dizziness has done better. Exercises are getting easier--especially  standing in the corner. Has not tried to swing a golf club yet.    Pertinent History  a-fib chronic anti-coag; DM; HLD, HTN, prostate Ca    Patient Stated Goals  Be able to go on his trip tomorrow to [place], [place]; to be able to get back to his usual level of activity     Currently in Pain?  No/denies         [facility] PT Assessment - 06/11/18 0001      Functional Gait  Assessment   Gait Level Surface  Walks 20 ft in less than 5.5 sec, no assistive devices, good speed, no evidence for imbalance, normal gait pattern, deviates no more than 6 in outside of the 12 in walkway width.   5.19   Change in Gait Speed  Able to smoothly change walking speed without loss of balance or gait deviation. Deviate no more than 6 in outside of the 12 in walkway width.    Gait with Horizontal Head Turns  Performs head turns smoothly with slight change in gait velocity (eg, minor disruption to smooth gait path), deviates 6-10 in outside 12 in walkway width, or uses an assistive device.    Gait with Vertical Head Turns  Performs task with slight change  in gait velocity (eg, minor disruption to smooth gait path), deviates 6 - 10 in outside 12 in walkway width or uses assistive device    Gait and Pivot Turn  Pivot turns safely within 3 sec and stops quickly with no loss of balance.    Step Over Obstacle  Is able to step over 2 stacked shoe boxes taped together (9 in total height) without changing gait speed. No evidence of imbalance.    Gait with Narrow Base of Support  Ambulates 4-7 steps.   4 steps   Gait with Eyes Closed  Cannot walk 20 ft without assistance, severe gait deviations or imbalance, deviates greater than 15 in outside 12 in walkway width or will not attempt task.    Ambulating Backwards  Walks 20 ft, no assistive devices, good speed, no evidence for imbalance, normal gait    Steps  Alternating feet, no rail.    Total Score  23                    Vestibular Treatment/Exercise - 06/11/18 0938       Vestibular Treatment/Exercise   Vestibular Treatment Provided  Gaze    Gaze Exercises  X1 Viewing Horizontal;X1 Viewing Vertical      X1 Viewing Horizontal   Foot Position  apart, together, 1/2 tandem 4" apart    Time  --   30-45 sec   Reps  6    Comments  feet together too challenging for his balance; 1/2 tandem feet apart, he began to feel dizzy (not just losing his balance)      X1 Viewing Vertical   Foot Position  apart, together, 1/2 tandem 4" apart    Time  --   30 sec   Reps  3    Comments  less dizziness than side to side        Walking in hallway with horizontal head turns x 160 ft, vertical x 80 ft (not as symptomatic, imbalance due to neuropathy not dizziness); walking tossing a small ball from hand to hand, progressively throwing it higher with incr dizziness when ball reaching eye level.    Balance Exercises - 06/11/18 0953      Balance Exercises: Standing   Standing Eyes Closed  Narrow base of support (BOS);Head turns;Solid surface    SLS  Eyes open;Solid surface;5 reps   each leg max 3.5 seconds; vc for tightening core            PT Long Term Goals - 05/22/18 2131      PT LONG TERM GOAL #1   Title  Patient will be independent with HEp (Target all LTGs 06/21/2018)    Time  4    Period  Weeks    Status  New      PT LONG TERM GOAL #2   Title  Patient will improve FGA score to >=23/30 to show decr fall risk.    Time  4    Period  Weeks    Status  New      PT LONG TERM GOAL #3   Title  Patient will improve gait velocity to >=2.62 ft/sec to demonstrate lesser fall risk in the community.     Time  4    Period  Weeks    Status  New      PT LONG TERM GOAL #4   Title  Patient will ambulate over outdoor level, paved surfaces modified independent with independent recovery if LOB occurs.  Time  4    Period  Weeks    Status  New            Plan - 06/11/18 0943    Clinical Impression Statement  Patient progressing well. Re-assessed  FGA with score improved 18 to 23/30. Some of his imbalance with activities appears to be more due to his neuropathy (assessed with monofilament with 0/10 spots felt on rt foot; felt 10/10 on left foot however location was inaccurate except for arch of foot). He was instructed to try to hit some golf balls this week as a way to assess how close to his baseline he is.     Rehab Potential  Good    PT Frequency  2x / week    PT Duration  4 weeks    PT Treatment/Interventions  ADLs/Self Care Home Management;Biofeedback;Canalith Repostioning;DME Instruction;Gait training;Stair training;Functional mobility training;Therapeutic exercise;Therapeutic activities;Balance training;Neuromuscular re-education;Patient/family education;Vestibular;Visual/perceptual remediation/compensation    PT Next Visit Plan  check pt's progress with VORx1 (1/2 tandem, 6 ft away, busy background; SLS activities (cone taps, one foot on vertical yoga block with head turns) ? add walk with hand along wall with head turns to HEP; balance activities to stimulate inner ear    PT Home Exercise Plan  VORx1 (1/2 tandem, 6 ft away, busy background), EC, feet together, head turns on solid floor; std hp abdct 3 sec hold; SLS x 10 sec    Consulted and Agree with Plan of Care  Patient;Family member/caregiver    Family Member Consulted  wife       Patient will benefit from skilled therapeutic intervention in order to improve the following deficits and impairments:  Decreased activity tolerance, Decreased balance, Decreased mobility, Decreased knowledge of use of DME, Decreased range of motion, Dizziness, Difficulty walking, Impaired sensation, Impaired vision/preception  Visit Diagnosis: Dizziness and giddiness  Unsteadiness on feet     Problem List Patient Active Problem List   Diagnosis Date Noted  . Chronic anticoagulation 04/14/2014  . Atrial flutter ([place])   . Prediabetes 03/11/2009  . SKIN CANCER, HX OF 03/11/2009  . Essential  hypertension 09/17/2008  . Permanent atrial fibrillation 09/17/2008  . Hyperlipidemia 11/01/2007  . PROSTATE CANCER, HX OF 01/18/2007  . Tubular adenoma of colon 01/12/2007    Rexanne Mano, PT 06/11/2018, 9:54 AM  [facility] [address] [place], [place], [address] Phone: [phone]   Fax:  [phone]  Name: HULAN SZUMSKI MRN: [id number] Date of Birth: 07/31/1946

## 2018-06-15 ENCOUNTER — Encounter: Payer: Self-pay | Admitting: Physical Therapy

## 2018-06-15 ENCOUNTER — Ambulatory Visit: Payer: Medicare Other | Admitting: Physical Therapy

## 2018-06-15 DIAGNOSIS — R262 Difficulty in walking, not elsewhere classified: Secondary | ICD-10-CM

## 2018-06-15 DIAGNOSIS — R2681 Unsteadiness on feet: Secondary | ICD-10-CM | POA: Diagnosis not present

## 2018-06-15 DIAGNOSIS — R42 Dizziness and giddiness: Secondary | ICD-10-CM | POA: Diagnosis not present

## 2018-06-15 NOTE — Patient Instructions (Signed)
  Feet Together, Head Motion - Eyes Closed    With eyes closed and feet together, hold for 30 seconds. Repeat total of 3 times.   *When you can do 3 in a row for 30 seconds without touching the walls, then move head slowly, up and down x 10 then left and right x 10  Do __1__ sessions per day.   *Can also add standing on a pillow or folded blanket (outdoor cushions are usually a good degree of firmness)

## 2018-06-15 NOTE — Therapy (Signed)
[place] [address] [place], [place], [address] Phone: [phone]   Fax:  [phone]  Physical Therapy Treatment and Discharge  Patient Details  Name: Mike Day MRN: [id number] Date of Birth: 1945/11/27 Referring Provider (PT): Ma Hillock, DO   Encounter Date: 06/15/2018  PT End of Session - 06/15/18 1712    Visit Number  5    Number of Visits  9    Date for PT Re-Evaluation  06/21/18    Authorization Type  Medicare A/B    Authorization Time Period  05/22/18 to 08/20/2018    PT Start Time  0849    PT Stop Time  0932    PT Time Calculation (min)  43 min    Activity Tolerance  Patient tolerated treatment well    Behavior During Therapy  [facility] for tasks assessed/performed       Past Medical History:  Diagnosis Date  . Anemia    Early 20's  . Atrial flutter ([place]) 2008   Status post RFCA Dr Cristopher Peru  . Chronic atrial fibrillation    Eliquis  . Diabetes mellitus without complication ([place])    Dx by A1c criteria 2014  . History of echocardiogram    Echo 12/14Severe basal septal hypertrophy, moderate LVH, EF 60-65%, moderate LAE, mild RAE   . Hx of adenomatous colonic polyps   . Hyperlipidemia   . Hypertension   . Prostate cancer [facility]) 2006   Prostatectomy    Past Surgical History:  Procedure Laterality Date  . COLONOSCOPY W/ POLYPECTOMY  2003;2010;04/2014   Tubular adenoma: recall 5 yrs (after 04/2019)  . HERNIA REPAIR  07/2008   Dr.Martin  . MOHS SURGERY     left hand  . 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 RADIOFREQUENCY ABLATION  01/15/2007   for ectopic atrial foci  . SEPTOPLASTY  Age 72  . TRANSTHORACIC ECHOCARDIOGRAM  09/02/2013   Normal.  EF 60%.  LAE and RAE.    There were no vitals filed for this visit.  Subjective Assessment - 06/15/18 1702    Subjective  Hit some golf balls yesteday and went better than before he had vertigo episode! Patient  very appreciative and surprised at how far he has come with PT's assistance.    Pertinent History  a-fib chronic anti-coag; DM; HLD, HTN, prostate Ca    Patient Stated Goals  Be able to go on his trip tomorrow to [place], [place]; to be able to get back to his usual level of activity     Currently in Pain?  No/denies                       [facility] Adult PT Treatment/Exercise - 06/15/18 1706      Ambulation/Gait   Ambulation/Gait  Yes    Ambulation/Gait Assistance  7: Independent    Ambulation Distance (Feet)  1000 Feet    Assistive device  None    Gait Pattern  Within Functional Limits    Ambulation Surface  Level;Unlevel;Indoor;Outdoor;Paved;Grass;Other (comment)   pinestraw, mulch,    Curb  7: Independent          Balance Exercises - 06/15/18 1709      Balance Exercises: Standing   Standing Eyes Opened  Narrow base of support (BOS);Foam/compliant surface;Head turns    Standing Eyes Closed  Wide (BOA);Foam/compliant surface    SLS  Eyes open;Intermittent upper extremity support;4 reps  Gait with Head Turns  Forward        PT Education - 06/15/18 1712    Education Details  finalized/updated HEP    Person(s) Educated  Patient    Methods  Explanation;Demonstration;Handout    Comprehension  Verbalized understanding;Returned demonstration          PT Long Term Goals - 06/15/18 1704      PT LONG TERM GOAL #1   Title  Patient will be independent with HEp (Target all LTGs 06/21/2018)    Time  4    Period  Weeks    Status  Achieved      PT LONG TERM GOAL #2   Title  Patient will improve FGA score to >=23/30 to show decr fall risk.    Baseline  10/07  23/30    Time  4    Period  Weeks    Status  Achieved      PT LONG TERM GOAL #3   Title  Patient will improve gait velocity to >=2.62 ft/sec to demonstrate lesser fall risk in the community.     Baseline  4.73 ft/sec    Time  4    Period  Weeks    Status  Achieved      PT LONG TERM GOAL #4   Title   Patient will ambulate over outdoor level, paved surfaces modified independent with independent recovery if LOB occurs.     Baseline  10/11 met (no LOB)    Time  4    Period  Weeks    Status  Achieved            Plan - 06/15/18 1713    Clinical Impression Statement  Patient doing very well and feels he is essentially back to his baseline. LTGs checked with pt meeting 4 of 4 goals. Patient has some remaining imbalance due to neuropathy of bil feet. He is very appreciative of HEP and plans to continue to work on improving his balance post-discharge. Patient is discharged from PT.     Rehab Potential  Good    PT Frequency  2x / week    PT Duration  4 weeks    PT Treatment/Interventions  ADLs/Self Care Home Management;Biofeedback;Canalith Repostioning;DME Instruction;Gait training;Stair training;Functional mobility training;Therapeutic exercise;Therapeutic activities;Balance training;Neuromuscular re-education;Patient/family education;Vestibular;Visual/perceptual remediation/compensation    PT Home Exercise Plan  VORx1 (1/2 tandem, 6 ft away, busy background), EC, feet together, head turns on solid floor; std hp abdct 3 sec hold; SLS x 10 sec    Consulted and Agree with Plan of Care  Patient;Family member/caregiver    Family Member Consulted  wife       Patient will benefit from skilled therapeutic intervention in order to improve the following deficits and impairments:  Decreased activity tolerance, Decreased balance, Decreased mobility, Decreased knowledge of use of DME, Decreased range of motion, Dizziness, Difficulty walking, Impaired sensation, Impaired vision/preception  Visit Diagnosis: Unsteadiness on feet  Difficulty in walking, not elsewhere classified     Problem List Patient Active Problem List   Diagnosis Date Noted  . Chronic anticoagulation 04/14/2014  . Atrial flutter ([place])   . Prediabetes 03/11/2009  . SKIN CANCER, HX OF 03/11/2009  . Essential hypertension  09/17/2008  . Permanent atrial fibrillation 09/17/2008  . Hyperlipidemia 11/01/2007  . PROSTATE CANCER, HX OF 01/18/2007  . Tubular adenoma of colon 01/12/2007   PHYSICAL THERAPY DISCHARGE SUMMARY  Visits from Start of Care: 5  Current functional level related to goals / functional  outcomes: See LTGs above   Remaining deficits: Imbalance due to neuropathy   Education / Equipment: HEP  Plan: Patient agrees to discharge.  Patient goals were partially met. Patient is being discharged due to meeting the stated rehab goals.  ?????       Rexanne Mano, PT 06/15/2018, 5:18 PM  [place] [address] [place], [place], [address] Phone: [phone]   Fax:  [phone]  Name: SAMMUEL BLICK MRN: [id number] Date of Birth: October 11, 1945

## 2018-06-22 ENCOUNTER — Ambulatory Visit (INDEPENDENT_AMBULATORY_CARE_PROVIDER_SITE_OTHER): Payer: Medicare Other | Admitting: Physician Assistant

## 2018-06-22 ENCOUNTER — Encounter: Payer: Self-pay | Admitting: Physician Assistant

## 2018-06-22 ENCOUNTER — Other Ambulatory Visit: Payer: Self-pay | Admitting: *Deleted

## 2018-06-22 ENCOUNTER — Encounter: Payer: Medicare Other | Admitting: Rehabilitative and Restorative Service Providers"

## 2018-06-22 VITALS — BP 126/62 | HR 60 | Ht 74.0 in | Wt 205.0 lb

## 2018-06-22 DIAGNOSIS — I4821 Permanent atrial fibrillation: Secondary | ICD-10-CM

## 2018-06-22 DIAGNOSIS — E782 Mixed hyperlipidemia: Secondary | ICD-10-CM | POA: Diagnosis not present

## 2018-06-22 DIAGNOSIS — I10 Essential (primary) hypertension: Secondary | ICD-10-CM

## 2018-06-22 MED ORDER — SIMVASTATIN 20 MG PO TABS: 20.0000 mg | ORAL_TABLET | Freq: Every day | ORAL | 3 refills | Status: DC

## 2018-06-22 MED ORDER — APIXABAN 5 MG PO TABS: 5.0000 mg | ORAL_TABLET | Freq: Two times a day (BID) | ORAL | 3 refills | Status: DC

## 2018-06-22 MED ORDER — AMLODIPINE BESYLATE 5 MG PO TABS: 5.0000 mg | ORAL_TABLET | Freq: Every day | ORAL | 3 refills | Status: DC

## 2018-06-22 MED ORDER — LISINOPRIL 40 MG PO TABS: 40.0000 mg | ORAL_TABLET | Freq: Every day | ORAL | 3 refills | Status: DC

## 2018-06-22 NOTE — Patient Instructions (Signed)
Medication Instructions:  REFILLS HAVE BEEN SENT IN  If you need a refill on your cardiac medications before your next appointment, please call your pharmacy.   Lab work: NONE ORDERED TODAY If you have labs (blood work) drawn today and your tests are completely normal, you will receive your results only by: [name] MyChart Message (if you have MyChart) OR . A paper copy in the mail If you have any lab test that is abnormal or we need to change your treatment, we will call you to review the results.  Testing/Procedures: NONE ORDERED TODAY  Follow-Up: At [facility], you and your health needs are our priority.  As part of our continuing mission to provide you with exceptional heart care, we have created designated Provider Care Teams.  These Care Teams include your primary Cardiologist (physician) and Advanced Practice Providers (APPs -  Physician Assistants and Nurse Practitioners) who all work together to provide you with the care you need, when you need it. You will need a follow up appointment in:  1 years.  Please call our office 2 months in advance to schedule this appointment.  You may see Sherren Mocha, MD or [facility], [facility] one of the following Advanced Practice Providers on your designated Care Team: [name], PA-C [place], [place] . Daune Perch, NP  Any Other Special Instructions Will Be Listed Below (If Applicable).

## 2018-06-22 NOTE — Progress Notes (Signed)
Cardiology Office Note:    Date:  06/22/2018   ID:  Mike Day, DOB 1945/11/04, MRN [id number]  PCP:  Ma Hillock, DO  Cardiologist:  Sherren Mocha, MD / [name], PA-C  Electrophysiologist:  None   Referring MD: Tammi Sou, MD   Chief Complaint  Patient presents with  . Follow-up    AFib     History of Present Illness:    Mike Day is a 72 y.o. male with atrial flutter status post RFCA, permanent atrial fibrillation, diabetes, HTN, HL, prostate cancer s/p prostatectomy. CHADS2-VASc=3 (DM, HTN, age x1).     Mike Day returns for follow up. He is here with his wife.  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  He is doing much better now.  He denies chest pain, shortness of breath, syncope, orthopnea, PND or significant pedal edema.   Prior CV studies:   The following studies were reviewed today:  ETT 10/16  Blood pressure demonstrated a hypertensive response to exercise.  There was no ST segment deviation noted during stress.  Echo 09/02/13  Severe basal septal hypertrophy, moderate LVH, EF 60-65%, moderate LAE, mild RAE   Past Medical History:  Diagnosis Date  . Anemia    Early 20's  . Atrial flutter ([place]) 2008   Status post RFCA Dr Cristopher Peru  . Chronic atrial fibrillation    Eliquis  . Diabetes mellitus without complication ([place])    Dx by A1c criteria 2014  . History of echocardiogram    Echo 12/14Severe basal septal hypertrophy, moderate LVH, EF 60-65%, moderate LAE, mild RAE   . Hx of adenomatous colonic polyps   . Hyperlipidemia   . Hypertension   . Prostate cancer [facility]) 2006   Prostatectomy   Surgical Hx: The patient  has a past surgical history that includes Radiofrequency ablation (01/15/2007); Mohs surgery; Colonoscopy w/ polypectomy (2003;2010;04/2014); Septoplasty (Age 28); Hernia repair (07/2008); Prostatectomy (2006); and transthoracic echocardiogram (09/02/2013).   Current  Medications: Current Meds  Medication Sig  . acetaminophen (TYLENOL) 500 MG tablet Take 1,000 mg by mouth every 6 (six) hours as needed for mild pain or headache.  [name] amLODipine (NORVASC) 5 MG tablet Take 1 tablet (5 mg total) by mouth daily. Please keep upcoming appt in October for future refills. Thank you  . apixaban (ELIQUIS) 5 MG TABS tablet Take 1 tablet (5 mg total) by mouth 2 (two) times daily.  [name] lisinopril (PRINIVIL,ZESTRIL) 40 MG tablet Take 1 tablet (40 mg total) by mouth daily. Please keep upcoming appt in October for future refills. Thank you  . simvastatin (ZOCOR) 20 MG tablet Take 1 tablet (20 mg total) by mouth at bedtime. Please keep upcoming appt in October for future refills. Thank you     Allergies:   Levofloxacin; Neosporin [neomycin-bacitracin zn-polymyx]; and Tape   Social History   Tobacco Use  . Smoking status: Former Smoker    Last attempt to quit: 09/06/2007    Years since quitting: 10.8  . Smokeless tobacco: Never Used  . Tobacco comment: smoked 1963 -2009; total 5 packs of cigarettes in life . Cigars < 1 / day on average 1992-2009  Substance Use Topics  . Alcohol use: Yes    Alcohol/week: 7.0 standard drinks    Types: 7 Glasses of wine per week    Comment: Socially  . Drug use: No     Family Hx: The patient's family history includes COPD in his father; Diabetes  in his mother; Emphysema in his father; Heart attack (age of onset: 52) in his brother; Heart disease in his brother and father; Hyperlipidemia in his brother; Hypertension in his brother; Lung disease in his father; Stroke (age of onset: 71) in his mother. There is no history of Cancer.  ROS:   Please see the history of present illness.    Review of Systems  Neurological: Positive for dizziness and loss of balance.   All other systems reviewed and are negative.   EKGs/Labs/Other Test Reviewed:    EKG:  EKG is  ordered today.  The ekg ordered today demonstrates AFib, HR 59, normal axis,  similar to old EKG dated 05/02/17  Recent Labs: 05/15/2018: ALT 14; BUN 14; Creatinine, Ser 1.00; Hemoglobin 15.9; Platelets 207.0; Potassium 4.1; Sodium 135; TSH 1.66   Recent Lipid Panel Lab Results  Component Value Date/Time   CHOL 119 02/06/2018 09:28 AM   TRIG 55.0 02/06/2018 09:28 AM   HDL 43.30 02/06/2018 09:28 AM   CHOLHDL 3 02/06/2018 09:28 AM   LDLCALC 65 02/06/2018 09:28 AM    Physical Exam:    VS:  BP 126/62   Pulse 60   Ht 6\' 2"  (1.88 m)   Wt 205 lb (93 kg)   SpO2 94%   BMI 26.32 kg/m     Wt Readings from Last 3 Encounters:  06/22/18 205 lb (93 kg)  06/05/18 208 lb 4 oz (94.5 kg)  05/16/18 199 lb 15.3 oz (90.7 kg)     Physical Exam  Constitutional: He is oriented to person, place, and time. He appears well-developed and well-nourished. No distress.  HENT:  Head: Normocephalic and atraumatic.  Neck: Neck supple. No JVD present.  Cardiovascular: Normal rate, S1 normal and S2 normal. An irregularly irregular rhythm present.  No murmur heard. Pulmonary/Chest: Breath sounds normal. He has no rales.  Abdominal: Soft. There is no hepatomegaly.  Musculoskeletal: He exhibits no edema.  Neurological: He is alert and oriented to person, place, and time.  Skin: Skin is warm and dry.    ASSESSMENT & PLAN:    Permanent atrial fibrillation  Rate controlled.  Recent Hgb and Creatinine normal.  Continue current dose of Apixaban.  Essential hypertension The patient's blood pressure is controlled on his current regimen.  Continue current therapy.    Mixed hyperlipidemia   LDL optimal on most recent lab work.  Continue current Rx.    Dispo:  Return in about 1 year (around 06/23/2019) for Routine Follow Up, w/ [name], PA-C.   Medication Adjustments/Labs and Tests Ordered: Current medicines are reviewed at length with the patient today.  Concerns regarding medicines are outlined above.  Tests Ordered: Orders Placed This Encounter  Procedures  . EKG 12-Lead    Medication Changes: No orders of the defined types were placed in this encounter.   Signed, [name], PA-C  06/22/2018 10:52 AM    [facility] [place], [place], [place]  [address] Phone: [phone]; Fax: [phone]

## 2018-06-25 ENCOUNTER — Encounter: Payer: Medicare Other | Admitting: Physical Therapy

## 2018-06-27 ENCOUNTER — Encounter: Payer: Medicare Other | Admitting: Physical Therapy

## 2018-07-17 ENCOUNTER — Encounter: Payer: Self-pay | Admitting: Family Medicine

## 2018-07-17 ENCOUNTER — Ambulatory Visit (INDEPENDENT_AMBULATORY_CARE_PROVIDER_SITE_OTHER): Payer: Medicare Other | Admitting: Family Medicine

## 2018-07-17 VITALS — BP 107/87 | HR 87 | Temp 98.6°F | Resp 20 | Ht 74.0 in | Wt 207.2 lb

## 2018-07-17 DIAGNOSIS — T7840XA Allergy, unspecified, initial encounter: Secondary | ICD-10-CM

## 2018-07-17 MED ORDER — HYDROXYZINE PAMOATE 25 MG PO CAPS: 25.0000 mg | ORAL_CAPSULE | Freq: Two times a day (BID) | ORAL | 0 refills | Status: DC | PRN

## 2018-07-17 MED ORDER — TRIAMCINOLONE ACETONIDE 0.1 % EX CREA: 1.0000 "application " | TOPICAL_CREAM | Freq: Two times a day (BID) | CUTANEOUS | 0 refills | Status: DC

## 2018-07-17 MED ORDER — PREDNISONE 20 MG PO TABS: ORAL_TABLET | ORAL | 0 refills | Status: DC

## 2018-07-17 MED ORDER — METHYLPREDNISOLONE ACETATE 80 MG/ML IJ SUSP
80.0000 mg | Freq: Once | INTRAMUSCULAR | Status: AC
  Administered 2018-07-17: 80 mg via INTRAMUSCULAR

## 2018-07-17 NOTE — Patient Instructions (Signed)
Start prednisone pills tomorrow. Shot provided today.  Start cream/steroid to area twice a day- avoid around the eyes.  Start vistaril/hysdroxine 1 tab before bed- may increase to every 12 hours and up to 2 pill (50 mg) if it does not make you too sedated, confused or other side effects (see below).  Do not use the new Aveeno cream.  Follow in 1 week if not improved, sooner if worsening, urgently if starts to affect breathing or mouth.    Anaphylactic Reaction An anaphylactic reaction (anaphylaxis) is a sudden allergic reaction that is very bad (severe). It also affects more than one part of the body. This condition can be life-threatening. If you have an anaphylactic reaction, you need to get medical help right away. You may need to stay in the hospital. Your doctor may teach you how to use an allergy kit (anaphylaxis kit) and how to give yourself an allergy shot (epinephrine injection). You can give yourself an allergy shot with what is commonly called an auto-injector "pen." Symptoms of an anaphylactic reaction may include:  A stuffy nose (nasal congestion).  Headache.  Tingling in your mouth.  A flushed face.  An itchy, red rash.  Swelling of your eyes, lips, face, or tongue.  Swelling of the back of your mouth and your throat.  Breathing loudly (wheezing).  A hoarse voice.  Itchy, red, swollen areas of skin (hives).  Dizziness or light-headedness.  Passing out (fainting).  Feeling worried or nervous (anxiety).  Feeling confused.  Pain in your belly (abdomen) or chest.  Trouble with breathing, talking, or swallowing.  A tight feeling in your chest or throat.  Fast or uneven heartbeats (palpitations).  Throwing up (vomiting).  Watery poop (diarrhea).  Follow these instructions at home: Safety  Always keep an auto-injector pen or your allergy kit with you. These could save your life. Use them as told by your doctor.  Do not drive until your doctor says that  it is safe.  Make sure that you, the people who live with you, and your employer know: ? How to use your allergy kit. ? How to use an auto-injector pen to give you an allergy shot.  If you used your auto-injector pen: ? Get more medicine for it right away. This is important in case you have another reaction. ? Get help right away.  Wear a bracelet or necklace that says you have an allergy, if your doctor tells you to do this.  Learn the signs of a very bad allergic reaction.  Work with your doctors to make a plan for what to do if you have a very bad allergic reaction. Being prepared is important. General instructions  Take over-the-counter and prescription medicines only as told by your doctor.  If you have itchy, red, swollen areas of skin or a rash: ? Use over-the-counter medicine (antihistamine) as told by your doctor. ? Put cold, wet cloths (cold compresses) on your skin. ? Take baths or showers in cool water. Avoid hot water.  If you had tests done, it is up to you to get your test results. Ask your doctor when your results will be ready.  Tell any doctors who care for you that you have an allergy.  Keep all follow-up visits as told by your doctor. This is important. How is this prevented?  Avoid things (allergens) that gave you a very bad allergic reaction before.  If you have a food allergy and you go to a restaurant, tell your server  about your allergy. If you are not sure if your meal was made with food that you are allergic to, ask your server before you eat it. Contact a doctor if:  You have symptoms of an allergic reaction. You may notice them soon after being around whatever it is that you are allergic to. Symptoms may include: ? A rash. ? A headache. ? Sneezing or a runny nose. ? Swelling. ? Feeling sick to your stomach. ? Watery poop. Get help right away if:  You had to use your auto-injector pen. You must go to the emergency room even if the medicine  seems to be working.  You have any of these: ? A tight feeling in your chest or your throat. ? Loud breathing. ? Trouble with breathing. ? Itchy, red, swollen areas of skin. ? Red skin or itching all over your body. ? Swelling in your lips, tongue, or the back of your throat.  You have throwing up that gets very bad.  You have watery poop that gets very bad.  You pass out or feel like you might pass out. These symptoms may be an emergency. Do not wait to see if the symptoms will go away. Use your auto-injector pen or allergy kit as you have been told. Get medical help right away. Call your local emergency services (911 in the [place] Do not drive yourself to the hospital. Summary  An anaphylactic reaction (anaphylaxis) is a sudden allergic reaction that is very bad (severe).  This condition can be life-threatening. If you have an anaphylactic reaction, you need to get medical help right away.  Your doctor may teach you how to use an allergy kit (anaphylaxis kit) and how to give yourself an allergy shot (epinephrine injection) with an auto-injector "pen."  Always keep an auto-injector pen or your allergy kit with you. These could save your life. Use them as told by your doctor.  If you had to use your auto-injector pen, you must go to the emergency room even if the medicine seems to be working. This information is not intended to replace advice given to you by your health care provider. Make sure you discuss any questions you have with your health care provider. Document Released: 02/08/2008 Document Revised: 04/15/2016 Document Reviewed: 04/15/2016 Elsevier Interactive Patient Education  2017 [employer].

## 2018-07-17 NOTE — Progress Notes (Signed)
Mike Day , 1945-10-22, 72 y.o., male MRN: [id number] Patient Care Team    Relationship Specialty Notifications Start End  Ma Hillock, DO PCP - General Family Medicine  06/11/18   Sherren Mocha, MD PCP - Cardiology Cardiology Admissions 06/22/18    Comment: PA:  [name], PA-C  Rod Can, MD Consulting Physician Orthopedic Surgery  10/03/15   Sharmon Revere Physician Assistant Cardiology  04/29/16   Rolm Bookbinder, MD Consulting Physician Dermatology  08/24/17   Myrlene Broker, MD Attending Physician Urology  06/11/18   Warden Fillers, MD Consulting Physician Ophthalmology  06/11/18     Chief Complaint  Patient presents with  . Rash    x 6 days     Subjective: Pt presents for an OV with complaints of itchy red rash of 6-7 days  duration.  Associated symptoms include itchiness, eye swelling and skin peeling.  Pt has tried benadryl and hydrocortisone cream to ease their symptoms. He had a similar reaction w/ neosporin a few years ago. They bring with him the lotions he uses over these area. He did not start a new tube of aveeno about 2 weeks ago, which has a "new look".   Depression screen [facility] 2/9 06/05/2018 08/24/2017 06/05/2015 07/02/2013  Decreased Interest 0 0 0 0  Down, Depressed, Hopeless 0 0 0 0  PHQ - 2 Score 0 0 0 0    Allergies  Allergen Reactions  . Levofloxacin Rash  . Neosporin [Neomycin-Bacitracin Zn-Polymyx] Other (See Comments)    Dry/scaly skin  . Tape Other (See Comments)    ADHESIVE TAPE; BLISTERS,   Social History   Tobacco Use  . Smoking status: Former Smoker    Last attempt to quit: 09/06/2007    Years since quitting: 10.8  . Smokeless tobacco: Never Used  . Tobacco comment: smoked 1963 -2009; total 5 packs of cigarettes in life . Cigars < 1 / day on average 1992-2009  Substance Use Topics  . Alcohol use: Yes    Alcohol/week: 7.0 standard drinks    Types: 7 Glasses of wine per week    Comment: Socially   Past Medical  History:  Diagnosis Date  . Anemia    Early 20's  . Atrial flutter ([place]) 2008   Status post [facility] Dr Cristopher [place]  . Chronic atrial fibrillation    Eliquis  . Diabetes mellitus without complication ([place])    Dx by A1c criteria 2014  . History of echocardiogram    Echo 12/14Severe basal septal hypertrophy, moderate LVH, EF 60-65%, moderate LAE, mild RAE   . Hx of adenomatous colonic polyps   . Hyperlipidemia   . Hypertension   . Prostate cancer [facility]) 2006   Prostatectomy   Past Surgical History:  Procedure Laterality Date  . COLONOSCOPY W/ POLYPECTOMY  2003;2010;04/2014   Tubular adenoma: recall 5 yrs (after 04/2019)  . HERNIA REPAIR  07/2008   Dr.Martin  . MOHS SURGERY     left hand  . 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 RADIOFREQUENCY ABLATION  01/15/2007   for ectopic atrial foci  . SEPTOPLASTY  Age 59  . TRANSTHORACIC ECHOCARDIOGRAM  09/02/2013   Normal.  EF 60%.  LAE and RAE.   Family History  Problem Relation Age of Onset  . Lung disease Father        Black Lung  . Heart disease Father        Congenital  .  Emphysema Father   . COPD Father   . Diabetes Mother   . Stroke Mother 19  . Hypertension Brother   . Heart disease Brother   . Hyperlipidemia Brother   . Heart attack Brother 26       sudden death  . Cancer Neg Hx    Allergies as of 07/17/2018      Reactions   Levofloxacin Rash   Neosporin [neomycin-bacitracin Zn-polymyx] Other (See Comments)   Dry/scaly skin   Tape Other (See Comments)   ADHESIVE TAPE; BLISTERS,      Medication List        Accurate as of 07/17/18 10:14 AM. Always use your most recent med list.          acetaminophen 500 MG tablet Commonly known as:  TYLENOL Take 1,000 mg by mouth every 6 (six) hours as needed for mild pain or headache.   amLODipine 5 MG tablet Commonly known as:  NORVASC Take 1 tablet (5 mg total) by mouth daily.   apixaban 5 MG Tabs tablet Commonly known as:  ELIQUIS Take  1 tablet (5 mg total) by mouth 2 (two) times daily.   lisinopril 40 MG tablet Commonly known as:  PRINIVIL,ZESTRIL Take 1 tablet (40 mg total) by mouth daily.   simvastatin 20 MG tablet Commonly known as:  ZOCOR Take 1 tablet (20 mg total) by mouth at bedtime.       All past medical history, surgical history, allergies, family history, immunizations andmedications were updated in the EMR today and reviewed under the history and medication portions of their EMR.     ROS: Negative, with the exception of above mentioned in HPI   Objective:  BP 107/87 (BP Location: Left Arm, Patient Position: Sitting, Cuff Size: Large)   Pulse 87   Temp 98.6 F (37 C)   Resp 20   Ht 6\' 2"  (1.88 m)   Wt 207 lb 3.2 oz (94 kg)   SpO2 97%   BMI 26.60 kg/m  Body mass index is 26.6 kg/m. Gen: Afebrile. No acute distress. Nontoxic in appearance, well developed, well nourished. Pleasant caucasian male  HENT: AT. [place]. MMM Eyes:Pupils Equal Round Reactive to light, Extraocular movements intact,  Conjunctiva without redness, discharge or icterus. Skin: red macular papular rash with skin peeling over face, exposed scalp and neck.  no purpura or petechiae.  Neuro: Normal gait. PERLA. EOMi. Alert. Oriented x3  No exam data present No results found. No results found for this or any previous visit (from the past 24 hour(s)).  Assessment/Plan: Mike Day is a 72 y.o. male present for OV for  Allergic reaction, initial encounter - allergic reaction to topical cream- reaction is only to exposed areas of cream and not under hairline. STOP new aveeno.  - IM depo med shot today.  - start vistartil q12 prn, star pred short taper tomorrow. Kenalog cream to affected areas- avoiding use around eyes.  - methylPREDNISolone acetate (DEPO-MEDROL) injection 80 mg - F/U 1 week if not improving, sooner if worsening. May take a few weeks to completely resolve.   Reviewed expectations re: course of current medical  issues.  Discussed self-management of symptoms.  Outlined signs and symptoms indicating need for more acute intervention.  Patient verbalized understanding and all questions were answered.  Patient received an After-Visit Summary.    No orders of the defined types were placed in this encounter.    Note is dictated utilizing voice recognition software. Although note has been  proof read prior to signing, occasional typographical errors still can be missed. If any questions arise, please do not hesitate to call for verification.   electronically signed by:  Howard Pouch, DO  [place]

## 2018-08-09 ENCOUNTER — Other Ambulatory Visit: Payer: Self-pay | Admitting: Cardiovascular Disease

## 2018-08-09 DIAGNOSIS — I10 Essential (primary) hypertension: Secondary | ICD-10-CM

## 2018-08-09 MED ORDER — LISINOPRIL 40 MG PO TABS: 40.0000 mg | ORAL_TABLET | Freq: Every day | ORAL | 3 refills | Status: DC

## 2018-08-09 MED ORDER — SIMVASTATIN 20 MG PO TABS: 20.0000 mg | ORAL_TABLET | Freq: Every day | ORAL | 3 refills | Status: DC

## 2018-08-09 MED ORDER — AMLODIPINE BESYLATE 5 MG PO TABS: 5.0000 mg | ORAL_TABLET | Freq: Every day | ORAL | 3 refills | Status: DC

## 2018-08-24 DIAGNOSIS — N528 Other male erectile dysfunction: Secondary | ICD-10-CM | POA: Diagnosis not present

## 2018-08-24 DIAGNOSIS — Z8546 Personal history of malignant neoplasm of prostate: Secondary | ICD-10-CM | POA: Diagnosis not present

## 2018-09-12 DIAGNOSIS — C61 Malignant neoplasm of prostate: Secondary | ICD-10-CM | POA: Diagnosis not present

## 2019-01-04 DIAGNOSIS — B029 Zoster without complications: Secondary | ICD-10-CM

## 2019-01-04 HISTORY — DX: Zoster without complications: B02.9

## 2019-02-13 DIAGNOSIS — B0223 Postherpetic polyneuropathy: Secondary | ICD-10-CM | POA: Insufficient documentation

## 2019-02-14 ENCOUNTER — Telehealth: Payer: Self-pay | Admitting: Family Medicine

## 2019-02-14 DIAGNOSIS — L0889 Other specified local infections of the skin and subcutaneous tissue: Secondary | ICD-10-CM | POA: Diagnosis not present

## 2019-02-14 DIAGNOSIS — B029 Zoster without complications: Secondary | ICD-10-CM | POA: Diagnosis not present

## 2019-02-14 DIAGNOSIS — B0239 Other herpes zoster eye disease: Secondary | ICD-10-CM | POA: Diagnosis not present

## 2019-02-14 DIAGNOSIS — Z85828 Personal history of other malignant neoplasm of skin: Secondary | ICD-10-CM | POA: Diagnosis not present

## 2019-02-14 DIAGNOSIS — H25813 Combined forms of age-related cataract, bilateral: Secondary | ICD-10-CM | POA: Diagnosis not present

## 2019-02-14 NOTE — Telephone Encounter (Signed)
Patient went to [facility] today for a rash. Dr. Martin Majestic diagnosed shingles. Patient is going to the eye doctor this afternoon since it is close to his eye. Patient wanted to let Dr. Raoul Pitch know. He doesn't have the new Rx from the dermatologist yet. He will call back after he picks it up to let us know the name. He wanted to check with Dr. Raoul Pitch to make sure that the new shingles medication would not affect his afib. Patient advised that Dr. Raoul Pitch is out of the office this afternoon but would review note 02/15/19.

## 2019-02-15 ENCOUNTER — Telehealth: Payer: Self-pay

## 2019-02-15 ENCOUNTER — Telehealth: Payer: Self-pay | Admitting: Physician Assistant

## 2019-02-15 ENCOUNTER — Telehealth: Payer: Self-pay | Admitting: Family Medicine

## 2019-02-15 DIAGNOSIS — R531 Weakness: Secondary | ICD-10-CM

## 2019-02-15 DIAGNOSIS — R42 Dizziness and giddiness: Secondary | ICD-10-CM

## 2019-02-15 DIAGNOSIS — R55 Syncope and collapse: Secondary | ICD-10-CM

## 2019-02-15 DIAGNOSIS — I951 Orthostatic hypotension: Secondary | ICD-10-CM

## 2019-02-15 DIAGNOSIS — I4821 Permanent atrial fibrillation: Secondary | ICD-10-CM

## 2019-02-15 NOTE — Telephone Encounter (Signed)
I spoke to the patient and his wife in regards to his BP.  They are concerned because it has been running low (84Q-59C systolic) HR 76F-94V.  He has been feeling tired and weak lately with no other symptoms, except for some swelling on his R foot.  They sent a My Chart message on 6/10 and were wondering why this was not addressed sooner.    On 6/11, he was diagnosed with Shingles and has been prescribed Valtrex 1 Gram tid for a period of 1 week.  They are concerned if this may be decreasing the BP. Please advise, thank you.

## 2019-02-15 NOTE — Telephone Encounter (Signed)
Copied from [place] [id number]. Topic: Quick Communication - See Telephone Encounter >> Feb 15, 2019  9:35 AM Robina Ade, Helene Kelp D wrote: CRM for notification. See Telephone encounter for: 02/15/19. Patient wife called and would like to talk to Dr. Raoul Pitch or her CMA about patients issue with shingles and the itchiness. She wanted to know if he can take vistaril for the itchiness. She also wants to know if there is something else other then tylenol he can take for pain and last if patient can get lidocain patch. Please call patient back, thanks.

## 2019-02-15 NOTE — Telephone Encounter (Signed)
CRM regarding this converted to telephone encounter and sent to PCP as FYI. Pt's wife returned called back  with information yesterday

## 2019-02-15 NOTE — Telephone Encounter (Signed)
Does pt need to make appt or call dermatology who made the dx?   Please advise

## 2019-02-15 NOTE — Telephone Encounter (Signed)
Patient can take the Vistaril for the itchiness, with caution on sedation it can make some people sleepy. For pain he can take Tylenol and use either lidocaine patches or lidocaine gel/cream which are over-the-counter. The over-the-counter patches and gel are almost the same strength as the prescribed and the prescribed lidocaine patches are not covered by insurance.   NSAIDs are contraindicated secondary to his blood thinners. If he needs any additional pain control, I would need to see him at least virtually to discuss since the next options would be prescribed medications.

## 2019-02-15 NOTE — Telephone Encounter (Signed)
FYI. Please see below  Copied from [place] [id number]. Topic: General - Other >> Feb 14, 2019  4:52 PM Rainey Pines A wrote: Wife called to inform Dr.Kuneff patient was prescribed Valtrex from the dermatologist.

## 2019-02-15 NOTE — Telephone Encounter (Signed)
Pt was advised regarding PCP recommendations and will call back to schedule if itchiness worsens or no improvement.

## 2019-02-15 NOTE — Telephone Encounter (Signed)
New Message  Pt c/o BP issue:  1. What are your last 5 BP readings? 87/44, 99/57 2. Are you having any other symptoms (ex. Dizziness, headache, blurred vision, passed out)? Patient has shingles are on his face. But his bp issues has been going on for about 2 weeks.  3. What is your medication issue? No    She also mentioned at the end of the call that he has been experiencing edema in his right foot. He was placed on valtrex for the shingles. She is concerned because he has been having spells as if he is going to fall out. Please call to discuss.

## 2019-02-17 NOTE — Telephone Encounter (Signed)
Unfortunately, the MyChart message was never sent to me. PLAN:  1. Stop Amlodipine 2. Arrange Zio 3-14 day monitor (if he has had any near syncope or syncope, he should have the live tele monitor). 3. Arrange Virtual Visit with me. 4. Call if BP < 449 or > 675 systolic. [name], PA-C    02/17/2019 9:03 PM

## 2019-02-18 ENCOUNTER — Telehealth: Payer: Self-pay | Admitting: Radiology

## 2019-02-18 NOTE — Telephone Encounter (Signed)
Left message to call back  

## 2019-02-18 NOTE — Telephone Encounter (Signed)
Spoke with the pt and wife and informed them both that per [name] PA-C, given the pts mychart message, he would like the pt to STOP Amlodipine, arrange for him to have a 3-14 day long term zio patch, schedule a virtual visit with him sometime this week, and he or his wife should call if his systolic BP is <482 or >707.  Clarified with both the pt and wife if he was having near syncope or syncopal episodes, and the pt is not.  Pt is very weak, he has dizziness, he has PAF, and he is dealing with new onset shingles on top of that. They would like to proceed with the 3-14 day zio.  Scheduled the pt to see [name] PA-C for this Friday 6/19 at 1115.  Endorsed to both parties that Scott's CMA will be in contact with them either tomorrow or the next day, to discuss how this visit will work and provide platform instructions as well as obtain consent.  Reiterated BP parameters Scott mentioned in this message, and when to be alarmed and call.  Informed both parties that I will send a message to our monitor techs to call them back and make arrangements for his zio patch.  Advised the pt to hydrate well.  Both parties verbalized understanding and agrees with this plan.  Both were very gracious for the follow-up call.  Will route this message to Scott's covering CMA for further follow-up of making sure zio patch gets to the pt, and to set him up for his virtual visit.

## 2019-02-18 NOTE — Telephone Encounter (Signed)
Follow Up:    Pt had been out, was wondering if he had missed a call or anybody had tried to call him back.

## 2019-02-18 NOTE — Telephone Encounter (Signed)
Enrolled patient for a 14 day Zio monitor to be mailed. Brief instructions were gone over with the patient and he knows to expect the monitor to arrive in 3-4 days.  

## 2019-02-18 NOTE — Telephone Encounter (Signed)
Can someone call him on Tuesday to see how he is doing? If he is still weak/dizzy, we should bring him in with the DOD or APPOD to be seen in person, get an ECG, orthostatics, etc. [name], PA-C    02/18/2019 6:07 PM

## 2019-02-18 NOTE — Telephone Encounter (Signed)
Patient returning office call. Please call back after lunch

## 2019-02-19 NOTE — Telephone Encounter (Signed)
Spoke with patient who stated his blood pressure was 114/70.  Patent stated his dizziness and weakness has gotten much better but dealing with shingles not fun. Has sleepless nights and still some swelling in right leg.  Patient was told provider will be contacted with update and contact back if have any further recommendations.

## 2019-02-19 NOTE — Telephone Encounter (Signed)
Ok to schedule next week when his Shingles are improved. [name], PA-C    02/19/2019 8:47 PM

## 2019-02-20 NOTE — Telephone Encounter (Signed)
Follow Up:  Wife is calling this morning to report that pt had an episode last night where his blood pressure went up to 17/105. She gave the pt Amlodipine.They had stopped his Amlodipine, She said she did not know what else to do. She took it again  around eleven something and it wast down to 126/76. This morning it is 130/70.

## 2019-02-20 NOTE — Telephone Encounter (Signed)
Resume Norvasc but take 2.5 mg once daily  Keep track of blood pressure. If it spikes > 150/90, take extra 2.5 mg and let us know. It is ok to break the Norvasc he has in 1/2 until we know if he will need 2.5 mg or 5 mg to keep his BP optimal. [name], PA-C    02/20/2019 6:02 PM

## 2019-02-20 NOTE — Telephone Encounter (Signed)
F/U Message             Patient's wife is calling because patient pressure is up again, patient was instructed to call if pressure goes up again.

## 2019-02-20 NOTE — Telephone Encounter (Signed)
Spoke with the patient's wife, his pressure currently is 172/93, his heart rate so far today was 57. His wife is worried since his pressure has been elevated. Last night his blood pressure was at 170/105, she gave an amlodipine and it brought it to 126/76. She wants to know what to do since his pressure is high.

## 2019-02-21 ENCOUNTER — Other Ambulatory Visit (INDEPENDENT_AMBULATORY_CARE_PROVIDER_SITE_OTHER): Payer: Medicare Other

## 2019-02-21 DIAGNOSIS — R42 Dizziness and giddiness: Secondary | ICD-10-CM

## 2019-02-21 DIAGNOSIS — I4821 Permanent atrial fibrillation: Secondary | ICD-10-CM | POA: Diagnosis not present

## 2019-02-21 DIAGNOSIS — R531 Weakness: Secondary | ICD-10-CM

## 2019-02-21 DIAGNOSIS — I951 Orthostatic hypotension: Secondary | ICD-10-CM | POA: Diagnosis not present

## 2019-02-21 MED ORDER — AMLODIPINE BESYLATE 5 MG PO TABS: ORAL_TABLET | ORAL | 3 refills | Status: DC

## 2019-02-21 NOTE — Telephone Encounter (Signed)
Spoke with the patient, he expressed understanding about taking his amlodipine.

## 2019-02-21 NOTE — Progress Notes (Signed)
Virtual Visit via Video Note   This visit type was conducted due to national recommendations for restrictions regarding the COVID-19 Pandemic (e.g. social distancing) in an effort to limit this patient's exposure and mitigate transmission in our community.  Due to his co-morbid illnesses, this patient is at least at moderate risk for complications without adequate follow up.  This format is felt to be most appropriate for this patient at this time.  All issues noted in this document were discussed and addressed.  A limited physical exam was performed with this format.  Please refer to the patient's chart for his consent to telehealth for [facility].   Date:  02/22/2019   ID:  Mike Day, DOB May 24, 1946, MRN [id number]  Patient Location: Home Provider Location: Office  PCP:  Mike Hillock, DO  Cardiologist:  Sherren Mocha, MD / [name], PA-C  Electrophysiologist:  None   Evaluation Performed:  Follow-Up Visit  Chief Complaint:  Low BP  History of Present Illness:    Mike Day is a 73 y.o. male with:  AFlutter s/p RFCA  Permanent AFib  CHADS2-VASc=3 (DM, HTN, age x1).  Diabetes mellitus  Hypertension   Hyperlipidemia   Prostate CA s/p prostatectomy  I saw him last in 06/2018.  He called in recently with R foot swelling, fatigue, low blood pressure and reported he had an active zoster infection.  I stopped his Amlodipine (BP 80s/40s at times).  He called back with complaints of markedly elevated blood pressure.  I have requested a 14 day monitor to rule out significant bradycardia.  Today, he notes that he had about a week of feeling low energy, lightheaded and his blood pressure was in the 80s at times.  He then broke out with shingles on his face and the trigeminal nerve distribution.  This was left-sided.  He had to see ophthalmology to rule out eye involvement.  He just finished Valtrex.  He is feeling better now.  He is taking 2.5 mg of amlodipine twice a  day and his blood pressure has been optimal.  He has not had any further lightheadedness or near syncope.  He denies any history of syncope.  He has not had chest pain, shortness of breath or orthopnea.  He did injure his right foot a couple of weeks ago.  He thinks he still has some glass in there.  His right leg was swelling some.  It has been decreasing and is no longer swollen today.  The patient does not have symptoms concerning for COVID-19 infection (fever, chills, cough, or new shortness of breath).    Past Medical History:  Diagnosis Date  . Anemia    Early 20's  . Atrial flutter ([place]) 2008   Status post RFCA Dr Cristopher Peru  . Chronic atrial fibrillation    Eliquis  . Diabetes mellitus without complication ([place])    Dx by A1c criteria 2014  . History of echocardiogram    Echo 12/14Severe basal septal hypertrophy, moderate LVH, EF 60-65%, moderate LAE, mild RAE   . Hx of adenomatous colonic polyps   . Hyperlipidemia   . Hypertension   . Prostate cancer [facility]) 2006   Prostatectomy   Past Surgical History:  Procedure Laterality Date  . COLONOSCOPY W/ POLYPECTOMY  2003;2010;04/2014   Tubular adenoma: recall 5 yrs (after 04/2019)  . HERNIA REPAIR  07/2008   Dr.Martin  . MOHS SURGERY     left hand  . 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 RADIOFREQUENCY ABLATION  01/15/2007   for ectopic atrial foci  . SEPTOPLASTY  Age 33  . TRANSTHORACIC ECHOCARDIOGRAM  09/02/2013   Normal.  EF 60%.  LAE and RAE.     Current Meds  Medication Sig  . amLODipine (NORVASC) 2.5 MG tablet Take 1 tablet (2.5 mg total) by mouth 2 (two) times a day.  [name] apixaban (ELIQUIS) 5 MG TABS tablet Take 1 tablet (5 mg total) by mouth 2 (two) times daily.  [name] lisinopril (PRINIVIL,ZESTRIL) 40 MG tablet Take 1 tablet (40 mg total) by mouth daily.  . simvastatin (ZOCOR) 20 MG tablet Take 1 tablet (20 mg total) by mouth at bedtime.  . [DISCONTINUED] amLODipine (NORVASC) 5 MG tablet Take  2.5 mg, 1/2 tablet, daily, by mouth, take an extra 2.5 mg, if your blood pressure is greater than 150/90 and call the office.     Allergies:   Levofloxacin, Neosporin [neomycin-bacitracin zn-polymyx], and Tape   Social History   Tobacco Use  . Smoking status: Former Smoker    Quit date: 09/06/2007    Years since quitting: 11.4  . Smokeless tobacco: Never Used  . Tobacco comment: smoked 1963 -2009; total 5 packs of cigarettes in life . Cigars < 1 / day on average 1992-2009  Substance Use Topics  . Alcohol use: Yes    Alcohol/week: 7.0 standard drinks    Types: 7 Glasses of wine per week    Comment: Socially  . Drug use: No     Family Hx: The patient's family history includes COPD in his father; Diabetes in his mother; Emphysema in his father; Heart attack (age of onset: 34) in his brother; Heart disease in his brother and father; Hyperlipidemia in his brother; Hypertension in his brother; Lung disease in his father; Stroke (age of onset: 6) in his mother. There is no history of Cancer.  ROS:   Please see the history of present illness.     All other systems reviewed and are negative.   Prior CV studies:   The following studies were reviewed today:   ETT 10/16  Blood pressure demonstrated a hypertensive response to exercise.  There was no ST segment deviation noted during stress.  Echo 09/02/13  Severe basal septal hypertrophy, moderate LVH, EF 60-65%, moderate LAE, mild RAE  Labs/Other Tests and Data Reviewed:    EKG:  No ECG reviewed.  Recent Labs: 05/15/2018: ALT 14; BUN 14; Creatinine, Ser 1.00; Hemoglobin 15.9; Platelets 207.0; Potassium 4.1; Sodium 135; TSH 1.66   Recent Lipid Panel Lab Results  Component Value Date/Time   CHOL 119 02/06/2018 09:28 AM   TRIG 55.0 02/06/2018 09:28 AM   HDL 43.30 02/06/2018 09:28 AM   CHOLHDL 3 02/06/2018 09:28 AM   LDLCALC 65 02/06/2018 09:28 AM    Wt Readings from Last 3 Encounters:  02/22/19 210 lb (95.3 kg)   07/17/18 207 lb 3.2 oz (94 kg)  06/22/18 205 lb (93 kg)     Objective:    Vital Signs:  BP 120/62   Pulse (!) 55   Ht 6\' 2"  (1.88 m)   Wt 210 lb (95.3 kg)   BMI 26.96 kg/m    VITAL SIGNS:  reviewed GEN:  no acute distress EYES:  sclerae anicteric, EOMI - Extraocular Movements Intact RESPIRATORY:  normal respiratory effort SKIN:  crusted lesions noted scattered about L face NEURO:  Alert and oriented PSYCH:  normal affect  ASSESSMENT & PLAN:  1. Essential hypertension His blood pressure appears to be better controlled now.  Continue amlodipine 2.5 mg twice daily.  If his pressure starts to run low again, we can cut his amlodipine back to 2.5 mg daily.  2. Permanent atrial fibrillation He is tolerating anticoagulation.  He is currently wearing a 14-day monitor.  I ordered this to rule out the possibility of significant bradycardia contributing to some of his symptoms.  However, I believe that a lot of his symptoms are related to his zoster infection.  If he does have significant pauses or bradycardia, I will refer him to EP.  3. Herpes zoster without complication This seems to be improving.  4. Educated About Covid-19 Virus Infection The signs and symptoms of COVID-19 were discussed with the patient and how to seek care for testing (follow up with PCP or arrange E-visit).  The importance of social distancing was discussed today.  5. Leg swelling He is got something in his right foot.  He thinks it is glass.  I have asked him to contact his primary care doctor to have this evaluated.  Time:   Today, I have spent 25 minutes with the patient with telehealth technology discussing the above problems.     Medication Adjustments/Labs and Tests Ordered: Current medicines are reviewed at length with the patient today.  Concerns regarding medicines are outlined above.   Tests Ordered: No orders of the defined types were placed in this encounter.   Medication Changes: Meds  ordered this encounter  Medications  . amLODipine (NORVASC) 2.5 MG tablet    Sig: Take 1 tablet (2.5 mg total) by mouth 2 (two) times a day.    Dispense:  180 tablet    Refill:  3    Do not fill, patient will call when they need a refill.    Follow Up:  Virtual Visit or In Person in 3 month(s)  Signed, [name], PA-C  02/22/2019 1:55 PM    [facility]

## 2019-02-22 ENCOUNTER — Encounter: Payer: Self-pay | Admitting: Physician Assistant

## 2019-02-22 ENCOUNTER — Telehealth (INDEPENDENT_AMBULATORY_CARE_PROVIDER_SITE_OTHER): Payer: Medicare Other | Admitting: Physician Assistant

## 2019-02-22 ENCOUNTER — Other Ambulatory Visit: Payer: Self-pay

## 2019-02-22 VITALS — BP 120/62 | HR 55 | Ht 74.0 in | Wt 210.0 lb

## 2019-02-22 DIAGNOSIS — I10 Essential (primary) hypertension: Secondary | ICD-10-CM

## 2019-02-22 DIAGNOSIS — M7989 Other specified soft tissue disorders: Secondary | ICD-10-CM

## 2019-02-22 DIAGNOSIS — I4821 Permanent atrial fibrillation: Secondary | ICD-10-CM

## 2019-02-22 DIAGNOSIS — Z7189 Other specified counseling: Secondary | ICD-10-CM

## 2019-02-22 DIAGNOSIS — B029 Zoster without complications: Secondary | ICD-10-CM

## 2019-02-22 MED ORDER — AMLODIPINE BESYLATE 2.5 MG PO TABS: 2.5000 mg | ORAL_TABLET | Freq: Two times a day (BID) | ORAL | 3 refills | Status: DC

## 2019-02-22 NOTE — Patient Instructions (Signed)
Medication Instructions:    Continue to take  Amlodipine 2.5 mg twice a day   If you need a refill on your cardiac medications before your next appointment, please call your pharmacy.   Lab work: NONE ORDERED  TODAY   If you have labs (blood work) drawn today and your tests are completely normal, you will receive your results only by: [name] MyChart Message (if you have MyChart) OR . A paper copy in the mail If you have any lab test that is abnormal or we need to change your treatment, we will call you to review the results.  Testing/Procedures: NONE ORDERED  TODAY'  Follow-Up: In 3 months with Mike Day  Any Other Special Instructions Will Be Listed Below (If Applicable). Follow up with Primary care about foot injuries

## 2019-02-24 ENCOUNTER — Other Ambulatory Visit: Payer: Self-pay | Admitting: Internal Medicine

## 2019-02-25 ENCOUNTER — Ambulatory Visit: Payer: Medicare Other | Admitting: Family Medicine

## 2019-02-25 NOTE — Telephone Encounter (Signed)
Pt is a 69yom requesting eliquis 5mg . Pt has a wt of94kg, scr 1(05/15/18), lov w/ weaver (02/22/19) will authorize a 3 month refill because labs will expire in september

## 2019-02-25 NOTE — Telephone Encounter (Signed)
Please review for refill.  

## 2019-02-26 ENCOUNTER — Encounter: Payer: Self-pay | Admitting: Family Medicine

## 2019-02-26 ENCOUNTER — Ambulatory Visit (HOSPITAL_BASED_OUTPATIENT_CLINIC_OR_DEPARTMENT_OTHER)
Admission: RE | Admit: 2019-02-26 | Discharge: 2019-02-26 | Disposition: A | Discharge status: Home / Self Care | Payer: Medicare Other | Source: Ambulatory Visit | Attending: Family Medicine | Admitting: Family Medicine

## 2019-02-26 ENCOUNTER — Other Ambulatory Visit: Payer: Self-pay

## 2019-02-26 ENCOUNTER — Ambulatory Visit (INDEPENDENT_AMBULATORY_CARE_PROVIDER_SITE_OTHER): Payer: Medicare Other | Admitting: Family Medicine

## 2019-02-26 VITALS — BP 121/72 | HR 70 | Temp 97.9°F | Resp 18 | Ht 74.0 in | Wt 200.0 lb

## 2019-02-26 DIAGNOSIS — R7303 Prediabetes: Secondary | ICD-10-CM | POA: Diagnosis not present

## 2019-02-26 DIAGNOSIS — E782 Mixed hyperlipidemia: Secondary | ICD-10-CM

## 2019-02-26 DIAGNOSIS — S99921A Unspecified injury of right foot, initial encounter: Secondary | ICD-10-CM | POA: Diagnosis not present

## 2019-02-26 DIAGNOSIS — Z7901 Long term (current) use of anticoagulants: Secondary | ICD-10-CM

## 2019-02-26 DIAGNOSIS — S90851A Superficial foreign body, right foot, initial encounter: Secondary | ICD-10-CM | POA: Diagnosis not present

## 2019-02-26 DIAGNOSIS — I10 Essential (primary) hypertension: Secondary | ICD-10-CM | POA: Diagnosis not present

## 2019-02-26 DIAGNOSIS — Z23 Encounter for immunization: Secondary | ICD-10-CM

## 2019-02-26 DIAGNOSIS — Z0389 Encounter for observation for other suspected diseases and conditions ruled out: Secondary | ICD-10-CM | POA: Diagnosis not present

## 2019-02-26 DIAGNOSIS — S91341A Puncture wound with foreign body, right foot, initial encounter: Secondary | ICD-10-CM | POA: Diagnosis not present

## 2019-02-26 DIAGNOSIS — B0223 Postherpetic polyneuropathy: Secondary | ICD-10-CM

## 2019-02-26 DIAGNOSIS — S90852A Superficial foreign body, left foot, initial encounter: Secondary | ICD-10-CM

## 2019-02-26 DIAGNOSIS — I4891 Unspecified atrial fibrillation: Secondary | ICD-10-CM

## 2019-02-26 LAB — COMPREHENSIVE METABOLIC PANEL
ALT: 13 U/L (ref 0–53)
AST: 12 U/L (ref 0–37)
Albumin: 4.2 g/dL (ref 3.5–5.2)
Alkaline Phosphatase: 59 U/L (ref 39–117)
BUN: 11 mg/dL (ref 6–23)
CO2: 30 mEq/L (ref 19–32)
Calcium: 9.6 mg/dL (ref 8.4–10.5)
Chloride: 97 mEq/L (ref 96–112)
Creatinine, Ser: 0.85 mg/dL (ref 0.40–1.50)
GFR: 88.38 mL/min (ref >60.00)
Glucose, Bld: 172 mg/dL — ABNORMAL HIGH (ref 70–99)
Potassium: 5.4 mEq/L — ABNORMAL HIGH (ref 3.5–5.1)
Sodium: 134 mEq/L — ABNORMAL LOW (ref 135–145)
Total Bilirubin: 0.7 mg/dL (ref 0.2–1.2)
Total Protein: 6.4 g/dL (ref 6.0–8.3)

## 2019-02-26 LAB — CBC WITH DIFFERENTIAL/PLATELET
Basophils Absolute: 0.1 10*3/uL (ref 0.0–0.1)
Basophils Relative: 0.9 % (ref 0.0–3.0)
Eosinophils Absolute: 0.1 10*3/uL (ref 0.0–0.7)
Eosinophils Relative: 1.7 % (ref 0.0–5.0)
HCT: 43 % (ref 39.0–52.0)
Hemoglobin: 14.6 g/dL (ref 13.0–17.0)
Lymphocytes Relative: 21.6 % (ref 12.0–46.0)
Lymphs Abs: 1.3 10*3/uL (ref 0.7–4.0)
MCHC: 33.9 g/dL (ref 30.0–36.0)
MCV: 98.8 fl (ref 78.0–100.0)
Monocytes Absolute: 0.4 10*3/uL (ref 0.1–1.0)
Monocytes Relative: 7.2 % (ref 3.0–12.0)
Neutro Abs: 4.2 10*3/uL (ref 1.4–7.7)
Neutrophils Relative %: 68.6 % (ref 43.0–77.0)
Platelets: 203 10*3/uL (ref 150.0–400.0)
RBC: 4.35 Mil/uL (ref 4.22–5.81)
RDW: 14.4 % (ref 11.5–15.5)
WBC: 6.1 10*3/uL (ref 4.0–10.5)

## 2019-02-26 LAB — HEMOGLOBIN A1C: Hgb A1c MFr Bld: 6.9 % — ABNORMAL HIGH (ref 4.6–6.5)

## 2019-02-26 MED ORDER — ZOSTER VAC RECOMB ADJUVANTED 50 MCG/0.5ML IM SUSR: 0.5000 mL | Freq: Once | INTRAMUSCULAR | 1 refills | Status: AC

## 2019-02-26 NOTE — Progress Notes (Signed)
Mike Day , 1946-04-26, 73 y.o., male MRN: [id number] Patient Care Team    Relationship Specialty Notifications Start End  Ma Hillock, DO PCP - General Family Medicine  06/11/18   Sherren Mocha, MD PCP - Cardiology Cardiology Admissions 06/22/18    Comment: PA:  [name], PA-C  Rod Can, MD Consulting Physician Orthopedic Surgery  10/03/15   Sharmon Revere Physician Assistant Cardiology  04/29/16   Rolm Bookbinder, MD Consulting Physician Dermatology  08/24/17   Myrlene Broker, MD Attending Physician Urology  06/11/18   Warden Fillers, MD Consulting Physician Ophthalmology  06/11/18     Chief Complaint  Patient presents with  . Foot Pain    Pt got piece of glass stuck in the bottom of his right foot x3-4 weeks ago. Wife removed glass with hemostats. Using warm salt water soaks daily. Foot is still painful and bruised     Subjective: Pt presents for an OV with complaints of foot pain of 4 weeks  Duration after stepping on glass in his garage.  HTN/HLD/A.fib: Pt reports compliance with amlodipine 2.5 mg twice daily,, lisinopril 40 mg daily, Zocor 20 mg daily, Eliquis 5 mg twice daily. Blood pressures ranges at home are within normal range.  He was having some lower blood pressures over the last 2 weeks and amlodipine was divided to twice daily dosing which seemed to resolve his issue. Patient denies chest pain, shortness of breath, dizziness or lower extremity edema.  Pt is  prescribed statin. BMP: 05/15/2018 within normal limits, GFR 78 CBC: 05/15/2018 within normal limits TSH: 05/15/2018 1.66 Diet: Low-sodium Lipids: 02/06/2018 total cholesterol 119, HDL 43, LDL 65, triglycerides 55 Exercise: Routine RF: Hypertension, hyperlipidemia, arrhythmia, family history heart disease and stroke, murmur smoker quit in 2009, Dr. Saunders Revel prescribes all meds.   Diabetes (diet controlled)/foot injury: Patient has been diet controlled for his diabetes and never medicated.  Patient denies dizziness, hyperglycemic or hypoglycemic events. Patient denies numbness, tingling in the extremities.  He does have a nonhealing wound after stepping on glass 4 weeks ago.  He states he did not think there was glass in it but then was having pain.  His wife was able to get the piece of glass out approximately 1 week after he stepped on the glass.  He has been performing Epson salt soaks a few times a day.  He denies any redness or drainage.  He states the area is still quite tender and bruised.  He is on chronic anticoagulation for his A. fib.  Tetanus completed 2012. He has been watching his diet and refraining from sugar. PNA series: Completed 2016 Flu shot: utd 2019 (recommneded yearly) Foot exam: Completed 02/26/2019 Eye exam: Completed 05/15/2018 A1c: 6.9--> 6.3>> ordered today  Depression screen [facility] 2/9 02/26/2019 06/05/2018 08/24/2017 06/05/2015 07/02/2013  Decreased Interest 0 0 0 0 0  Down, Depressed, Hopeless 0 0 0 0 0  PHQ - 2 Score 0 0 0 0 0    Allergies  Allergen Reactions  . Levofloxacin Rash  . Neosporin [Neomycin-Bacitracin Zn-Polymyx] Other (See Comments)    Dry/scaly skin  . Tape Other (See Comments)    ADHESIVE TAPE; BLISTERS,   Social History   Social History Narrative   Married, 1 son, 2 grand-daughters.   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   Alcohol: 1 glass red wine per night.   Was once a long distance runner.  Past Medical History:  Diagnosis Date  . Anemia    Early 20's  . Atrial flutter ([place]) 2008   Status post RFCA Dr Cristopher Peru  . Atrial flutter ([place])    2008 S/P radiofrequency ablation , Dr Lovena Le   . Chronic atrial fibrillation    Eliquis  . Diabetes mellitus without complication ([place])    Dx by A1c criteria 2014  . History of echocardiogram    Echo 12/14Severe basal septal hypertrophy, moderate LVH, EF 60-65%, moderate LAE, mild RAE   . Hx of adenomatous colonic polyps   .  Hyperlipidemia   . Hypertension   . Prostate cancer [facility]) 2006   Prostatectomy   Past Surgical History:  Procedure Laterality Date  . COLONOSCOPY W/ POLYPECTOMY  2003;2010;04/2014   Tubular adenoma: recall 5 yrs (after 04/2019)  . HERNIA REPAIR  07/2008   Dr.Martin  . MOHS SURGERY     left hand  . 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 RADIOFREQUENCY ABLATION  01/15/2007   for ectopic atrial foci  . SEPTOPLASTY  Age 4  . TRANSTHORACIC ECHOCARDIOGRAM  09/02/2013   Normal.  EF 60%.  LAE and RAE.   Family History  Problem Relation Age of Onset  . Lung disease Father        Black Lung  . Heart disease Father        Congenital  . Emphysema Father   . COPD Father   . Diabetes Mother   . Stroke Mother 61  . Hypertension Brother   . Heart disease Brother   . Hyperlipidemia Brother   . Heart attack Brother 16       sudden death  . Cancer Neg Hx    Allergies as of 02/26/2019      Reactions   Levofloxacin Rash   Neosporin [neomycin-bacitracin Zn-polymyx] Other (See Comments)   Dry/scaly skin   Tape Other (See Comments)   ADHESIVE TAPE; BLISTERS,      Medication List       Accurate as of February 26, 2019 12:36 PM. If you have any questions, ask your nurse or doctor.        amLODipine 2.5 MG tablet Commonly known as: NORVASC Take 1 tablet (2.5 mg total) by mouth 2 (two) times a day.   Eliquis 5 MG Tabs tablet Generic drug: apixaban TAKE 1 TABLET TWICE A DAY   lisinopril 40 MG tablet Commonly known as: ZESTRIL Take 1 tablet (40 mg total) by mouth daily.   simvastatin 20 MG tablet Commonly known as: ZOCOR Take 1 tablet (20 mg total) by mouth at bedtime.   Zoster Vaccine Adjuvanted injection Commonly known as: SHINGRIX Inject 0.5 mLs into the muscle once for 1 dose. Rpt dose in 3 mos once Started by: Howard Pouch, DO       All past medical history, surgical history, allergies, family history, immunizations andmedications were updated  in the EMR today and reviewed under the history and medication portions of their EMR.     ROS: Negative, with the exception of above mentioned in HPI   Objective:  BP 121/72 (BP Location: Left Arm, Patient Position: Sitting, Cuff Size: Normal)   Pulse 70   Temp 97.9 F (36.6 C) (Temporal)   Resp 18   Ht '6\' 2"'  (1.88 m)   Wt 200 lb (90.7 kg)   SpO2 99%   BMI 25.68 kg/m  Body mass index is 25.68 kg/m. Gen: Afebrile. No acute distress.  Nontoxic in appearance, well developed, well nourished.  HENT: AT. [place]. MMM Eyes:Pupils Equal Round Reactive to light, Extraocular movements intact,  Conjunctiva without redness, discharge or icterus. CV: RRR, no edema Chest: CTAB, no wheeze or crackles. Skin: no rashes, purpura or petechiae.  Neuro:  Normal gait. PERLA. EOMi. Alert. Oriented x3 . Psych: Normal affect, dress and demeanor. Normal speech. Normal thought content and judgment.  No exam data present No results found. No results found for this or any previous visit (from the past 24 hour(s)).  Assessment/Plan: JAYSON WATERHOUSE is a 73 y.o. male present for OV for  Injury of right foot, initial encounter/Foreign body in right foot -I am mildly concerned over the callus formation in this area.  Patient does not seem to know if this was there prior or in response to the injury.  Even though diet controlled with his diabetes the callus formation is of concern.  Discussed options with him today and we will obtain an x-ray to make sure it does not appear to have fluid pocket/abscess/osteomyelitis.  Area is still uncomfortable to touch, he states he cannot tell if there is anything left in the wound or not.  Glass will not show up greatly on x-ray, but we will start with this image modality first.  Also discussed care for the callus formation, and he declined a podiatry referral but agreed to Dr. Sharol Given - orthopedics. - Continue Epson salt soaks 2-3 times a day. - CBC w/Diff - Ambulatory referral to  Orthopedic Surgery - Td : Tetanus/diphtheria >7yo Preservative  free - DG Foot Complete Right; Future  Prediabetes/diet controlled  Well-controlled with diet and exercise.  A1c improved from 6.9-->6.3 months ago.  Has never been on medication. PNA series: Completed 2016 Flu shot: utd 2019 (recommneded yearly) Foot exam: Completed 02/26/2019 Eye exam: Completed 05/15/2018 A1c: 6.9--> 6.3>> ordered today Follow-up every 6 months for now, if we need to start medication will need to follow-up every 4 months.  Essential hypertension/Mixed hyperlipidemia/Permanent atrial fibrillation/Chronic anticoagulation -Stable.  Well controlled . No changes, continue cardiology follow ups, which provide medication.  -Continue Norvasc 2.5 mg twice daily, Eliquis 5 mg twice daily, lisinopril 40 mg daily, Zocor 20 mg daily On statin and chronic anticoag.  BMP:  Collected today CBC:  Collected today TSH: 05/15/2018 1.66 Diet: Low-sodium Lipids: 02/06/2018 total cholesterol 119, HDL 43, LDL 65, triglycerides 55 Exercise: Routine RF: Hypertension, hyperlipidemia, arrhythmia, family history heart disease and stroke, murmur smoker quit in 2009, Dr. Kathalene Frames prescribes all meds.    Need for Td vaccine - td > 6 years old with foot wound>> Td updated today  Shingles (herpes zoster) polyneuropathy - recent shingles infection left ear, neck and eye. He was seen by derm and eye doc. Eye exam good.  - He has 3 healing areas. He asked any specific cream or ointment he could use.  Advised him to try bag balm 2 times a day. -Ensured he has had recent follow-up with urology with his prior history of prostate cancer and 5 year follow up w/ GI due now.  - shingrix script provided for him today   Reviewed expectations re: course of current medical issues.  Discussed self-management of symptoms.  Outlined signs and symptoms indicating need for more acute intervention.  Patient verbalized understanding and all  questions were answered.  Patient received an After-Visit Summary.    Orders Placed This Encounter  Procedures  . DG Foot Complete Right  . Td : Tetanus/diphtheria >7yo Preservative  free  . CBC w/Diff  . Comp Met (CMET)  . Hemoglobin A1c  . Ambulatory referral to Orthopedic Surgery     Note is dictated utilizing voice recognition software. Although note has been proof read prior to signing, occasional typographical errors still can be missed. If any questions arise, please do not hesitate to call for verification.   electronically signed by:  Howard Pouch, DO  [place]

## 2019-02-26 NOTE — Patient Instructions (Addendum)
Tetanus updated today.  Shingrix script printed for you  Have xray completed at [facility] today.  We will call you with all results.  Referred you to Dr. Sharol Given for the callus on your foot and your prediabetes.  Continue epson salt soaks.   Follow up on your chronic medical conditions in 6 mos.    Diabetes Mellitus and Foot Care Foot care is an important part of your health, especially when you have diabetes. Diabetes may cause you to have problems because of poor blood flow (circulation) to your feet and legs, which can cause your skin to:  Become thinner and drier.  Break more easily.  Heal more slowly.  Peel and crack. You may also have nerve damage (neuropathy) in your legs and feet, causing decreased feeling in them. This means that you may not notice minor injuries to your feet that could lead to more serious problems. Noticing and addressing any potential problems early is the best way to prevent future foot problems. How to care for your feet Foot hygiene  Wash your feet daily with warm water and mild soap. Do not use hot water. Then, pat your feet and the areas between your toes until they are completely dry. Do not soak your feet as this can dry your skin.  Trim your toenails straight across. Do not dig under them or around the cuticle. File the edges of your nails with an emery board or nail file.  Apply a moisturizing lotion or petroleum jelly to the skin on your feet and to dry, brittle toenails. Use lotion that does not contain alcohol and is unscented. Do not apply lotion between your toes. Shoes and socks  Wear clean socks or stockings every day. Make sure they are not too tight. Do not wear knee-high stockings since they may decrease blood flow to your legs.  Wear shoes that fit properly and have enough cushioning. Always look in your shoes before you put them on to be sure there are no objects inside.  To break in new shoes, wear them for just a few hours  a day. This prevents injuries on your feet. Wounds, scrapes, corns, and calluses  Check your feet daily for blisters, cuts, bruises, sores, and redness. If you cannot see the bottom of your feet, use a mirror or ask someone for help.  Do not cut corns or calluses or try to remove them with medicine.  If you find a minor scrape, cut, or break in the skin on your feet, keep it and the skin around it clean and dry. You may clean these areas with mild soap and water. Do not clean the area with peroxide, alcohol, or iodine.  If you have a wound, scrape, corn, or callus on your foot, look at it several times a day to make sure it is healing and not infected. Check for: ? Redness, swelling, or pain. ? Fluid or blood. ? Warmth. ? Pus or a bad smell. General instructions  Do not cross your legs. This may decrease blood flow to your feet.  Do not use heating pads or hot water bottles on your feet. They may burn your skin. If you have lost feeling in your feet or legs, you may not know this is happening until it is too late.  Protect your feet from hot and cold by wearing shoes, such as at the beach or on hot pavement.  Schedule a complete foot exam at least once a year (annually) or  more often if you have foot problems. If you have foot problems, report any cuts, sores, or bruises to your health care provider immediately. Contact a health care provider if:  You have a medical condition that increases your risk of infection and you have any cuts, sores, or bruises on your feet.  You have an injury that is not healing.  You have redness on your legs or feet.  You feel burning or tingling in your legs or feet.  You have pain or cramps in your legs and feet.  Your legs or feet are numb.  Your feet always feel cold.  You have pain around a toenail. Get help right away if:  You have a wound, scrape, corn, or callus on your foot and: ? You have pain, swelling, or redness that gets  worse. ? You have fluid or blood coming from the wound, scrape, corn, or callus. ? Your wound, scrape, corn, or callus feels warm to the touch. ? You have pus or a bad smell coming from the wound, scrape, corn, or callus. ? You have a fever. ? You have a red line going up your leg. Summary  Check your feet every day for cuts, sores, red spots, swelling, and blisters.  Moisturize feet and legs daily.  Wear shoes that fit properly and have enough cushioning.  If you have foot problems, report any cuts, sores, or bruises to your health care provider immediately.  Schedule a complete foot exam at least once a year (annually) or more often if you have foot problems. This information is not intended to replace advice given to you by your health care provider. Make sure you discuss any questions you have with your health care provider. Document Released: 08/19/2000 Document Revised: 10/04/2017 Document Reviewed: 09/23/2016 Elsevier Interactive Patient Education  2019 [employer].

## 2019-02-27 ENCOUNTER — Telehealth: Payer: Self-pay | Admitting: Family Medicine

## 2019-02-27 ENCOUNTER — Encounter: Payer: Self-pay | Admitting: Family Medicine

## 2019-02-27 ENCOUNTER — Ambulatory Visit (INDEPENDENT_AMBULATORY_CARE_PROVIDER_SITE_OTHER): Payer: Medicare Other | Admitting: Orthopedic Surgery

## 2019-02-27 ENCOUNTER — Encounter: Payer: Self-pay | Admitting: Orthopedic Surgery

## 2019-02-27 VITALS — Ht 74.0 in | Wt 200.0 lb

## 2019-02-27 DIAGNOSIS — L97511 Non-pressure chronic ulcer of other part of right foot limited to breakdown of skin: Secondary | ICD-10-CM

## 2019-02-27 DIAGNOSIS — E119 Type 2 diabetes mellitus without complications: Secondary | ICD-10-CM

## 2019-02-27 DIAGNOSIS — E875 Hyperkalemia: Secondary | ICD-10-CM

## 2019-02-27 NOTE — Telephone Encounter (Signed)
Noted they declined management of diabetes condition.  Noted they declined to follow up on elevated potassium. Noted they decline to follow up on colonoscopy in August

## 2019-02-27 NOTE — Telephone Encounter (Signed)
Please inform patient the following information: - his labs resulted with a normal white count- which is in favor of no infection has set in. -  His potassium is elevated above his normal at 5.4 (NL 5.1)- avoid potassium rich foods or salt substitutes (which are typically potassium based) for a week and we will retest his potassium 1 week by lab appt.   - His foot xray did not show evidence of bone infection, abscess formation or foreign body retention. -  It does report "vascular calcifications noted"- which is calcified plaques along the vessel walls make them visible on xray. Just as it can build along the vessels of the heart/coronary or the neck/carotids, it can build up in other vessels. Plaque build up causes decreased blood flow as it continues to build and narrow the vessel. Which also means it will slow his healing process. If he asks this usually caused by a persons prior h/o either smoking, diabetes, hypertension and/or elevated cholesterol- all could contribute. Controlling those factors will help avoid progression.  The warm water/epson salt soaks will be helpful.   Actual read: 1. No acute osseous abnormality. 2. No radiopaque foreign body. 3. Vascular calcifications are noted.   -His a1c went up to 6.9 and his glucose was 172. This is the diabetic range and in order to protect his vessels (per discussion above on his xray) increase in exercise, closely monitoring diet - cutting back on sugar and heavy carbohydrate meals, as well as starting a low dose med. Metformin is the first line medication, it is an oral medication. We can either start this now and follow up in 3 mos or if he would prefer to discuss in person- make an appt within a week (virtual ok if he desires).   Lastly, in reference to his shingles he had recently... shingles can present when someone is mildly immunocompromised or stressed (mentally or physically) in anyway. To be cautious, Since he had a h/o prostate cancer  I would advise him to make sure he is up to date with following with his urologist and testing- as well his repeat colonoscopy that is due this August. Again, just being extra cautious- sometimes shingles just occur without cause has well.   Again, this is  A good bit of info to take in ... we can set up an appt or virtual visit to discuss if he desires.

## 2019-02-27 NOTE — Telephone Encounter (Signed)
Pt was called and wife was placed on speaker phone to listen per husbands request. Lab results were given to patient and wife. Wife stated they were not starting diabetic medications and he ate a big meal before appt. Pt/wife was educated on A1C and that is a 3 month avg of blood sugar readings. Wife declined for pt on starting any diabetic medications and stated they will control with diet and exercise. Pt has F/U appt in Sept with Urologist and is up to date. Wife declined colonoscopy at this time due to AFIB and stated he was under MD orders to not exercise at this time and would worry about colonoscopy when "this was all over". Wife refused to make return lab appt or make appt to discuss any results. She asked if pt had to go to podiatry appt then because his appt was at 2:45pm today, Wife and pt were advised to go to all scheduled appts. Wife stated "We know he has neuropathy in his feet and have known for years, they were going to be late for appt" Call was ended.

## 2019-02-27 NOTE — Progress Notes (Signed)
Office Visit Note   Patient: Mike Day           Date of Birth: [birth year]           MRN: [id number] Visit Date: 02/27/2019              Requested by: Ma Hillock, DO [address] [place],  [place] [address] PCP: Ma Hillock, DO  Chief Complaint  Patient presents with  . Right Foot - Pain      HPI: Patient is a 73 year old gentleman who is seen for initial evaluation for ulcer beneath the second metatarsal head right foot.  Patient states he stepped on a piece of glass about a month ago got it out but he still has a ulcerative callus.  Patient states he has constant throbbing pain and states he is a borderline diabetic.  Assessment & Plan: Visit Diagnoses:  1. Non-pressure chronic ulcer of other part of right foot limited to breakdown of skin ([place])     Plan: The ulcer was debrided of skin and soft tissue there is no foreign body within the wound and no foreign body visible on the x-ray.  Patient was given instructions to use a stiff soled sneaker orthotics to unload the forefoot Achilles stretching reevaluate in 4 weeks.  Follow-Up Instructions: Return in about 4 weeks (around 03/27/2019).   Ortho Exam  Patient is alert, oriented, no adenopathy, well-dressed, normal affect, normal respiratory effort. Examination patient has a good pulse he has dorsiflexion about 10 degrees past neutral he has prominent lesser metatarsal heads with a ulcerative callus over the second metatarsal head.  After informed consent a 10 blade knife was used to debride the skin and soft tissue back to bleeding viable granulation tissue the ulcerative areas 10 mm in diameter 3 mm deep.  There is no foreign body palpable within the depth of the wound this does not probe to bone or tendon.  Radiographs were reviewed which showed no foreign bodies he does have a long second third and fourth metatarsal.  Imaging: No results found. No images are attached to the encounter.  Labs: Lab Results   Component Value Date   HGBA1C 6.9 (H) 02/26/2019   HGBA1C 6.3 (A) 06/05/2018   HGBA1C 6.9 (H) 02/06/2018     Lab Results  Component Value Date   ALBUMIN 4.2 02/26/2019   ALBUMIN 4.4 05/15/2018   ALBUMIN 4.3 08/24/2017    Body mass index is 25.68 kg/m.  Orders:  No orders of the defined types were placed in this encounter.  No orders of the defined types were placed in this encounter.    Procedures: No procedures performed  Clinical Data: No additional findings.  ROS:  All other systems negative, except as noted in the HPI. Review of Systems  Objective: Vital Signs: Ht 6\' 2"  (1.88 m)   Wt 200 lb (90.7 kg)   BMI 25.68 kg/m   Specialty Comments:  No specialty comments available.  PMFS History: Patient Active Problem List   Diagnosis Date Noted  . 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 Atrial fibrillation ([place]) 10/07/2014  . Dermatochalasis of both upper eyelids 07/01/2014  . Chronic anticoagulation 04/14/2014  . 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 Malignant neoplasm of prostate ([place]) 05/18/2012  . Diabetes mellitus without complication ([place]) 17/40/8144  . SKIN CANCER, HX OF 03/11/2009  . Essential hypertension 09/17/2008  . Hyperlipidemia 11/01/2007  . PROSTATE CANCER, HX OF 01/18/2007  . Tubular  adenoma of colon 01/12/2007   Past Medical History:  Diagnosis Date  . Anemia    Early 20's  . Atrial flutter ([place]) 2008   Status post RFCA Dr Cristopher Peru  . Atrial flutter ([place])    2008 S/P radiofrequency ablation , Dr Lovena Le   . Chronic atrial fibrillation    Eliquis  . Diabetes mellitus without complication ([place])    Dx by A1c criteria 2014  . History of echocardiogram    Echo 12/14Severe basal septal hypertrophy, moderate LVH, EF 60-65%, moderate LAE, mild RAE   . Hx of adenomatous colonic polyps   . Hyperlipidemia   . Hypertension   . Prostate cancer [facility]) 2006   Prostatectomy  . Shingles 01/2019   left  side of face/eye/neck/ear     Family History  Problem Relation Age of Onset  . Lung disease Father        Black Lung  . Heart disease Father        Congenital  . Emphysema Father   . COPD Father   . Diabetes Mother   . Stroke Mother 77  . Hypertension Brother   . Heart disease Brother   . Hyperlipidemia Brother   . Heart attack Brother 11       sudden death  . Cancer Neg Hx     Past Surgical History:  Procedure Laterality Date  . COLONOSCOPY W/ POLYPECTOMY  2003;2010;04/2014   Tubular adenoma: recall 5 yrs (after 04/2019)  . HERNIA REPAIR  07/2008   Dr.Martin  . MOHS SURGERY     left hand  . 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 RADIOFREQUENCY ABLATION  01/15/2007   for ectopic atrial foci  . SEPTOPLASTY  Age 3  . TRANSTHORACIC ECHOCARDIOGRAM  09/02/2013   Normal.  EF 60%.  LAE and RAE.   Social History   Occupational History  . Not on file  Tobacco Use  . Smoking status: Former Smoker    Quit date: 09/06/2007    Years since quitting: 11.4  . Smokeless tobacco: Never Used  . Tobacco comment: smoked 1963 -2009; total 5 packs of cigarettes in life . Cigars < 1 / day on average 1992-2009  Substance and Sexual Activity  . Alcohol use: Yes    Alcohol/week: 7.0 standard drinks    Types: 7 Glasses of wine per week    Comment: Socially  . Drug use: No  . Sexual activity: Not Currently    Partners: Female

## 2019-03-07 ENCOUNTER — Ambulatory Visit (INDEPENDENT_AMBULATORY_CARE_PROVIDER_SITE_OTHER): Payer: Medicare Other | Admitting: Family Medicine

## 2019-03-07 ENCOUNTER — Other Ambulatory Visit: Payer: Self-pay

## 2019-03-07 DIAGNOSIS — E875 Hyperkalemia: Secondary | ICD-10-CM

## 2019-03-08 LAB — BASIC METABOLIC PANEL
BUN: 11 mg/dL (ref 7–25)
CO2: 26 mmol/L (ref 20–32)
Calcium: 9.7 mg/dL (ref 8.6–10.3)
Chloride: 100 mmol/L (ref 98–110)
Creat: 0.83 mg/dL (ref 0.70–1.18)
Glucose, Bld: 122 mg/dL — ABNORMAL HIGH (ref 65–99)
Potassium: 5.3 mmol/L (ref 3.5–5.3)
Sodium: 136 mmol/L (ref 135–146)

## 2019-03-12 ENCOUNTER — Telehealth: Payer: Self-pay | Admitting: Family Medicine

## 2019-03-12 ENCOUNTER — Encounter: Payer: Self-pay | Admitting: Orthopedic Surgery

## 2019-03-12 NOTE — Telephone Encounter (Signed)
Pt was called and told Dr Raoul Pitch is not here today but shingles would not cause dizziness 4 weeks after being gone. Pt has been taking BP and no abnormal readings per patient. It was hard to communicate with patient as wife was in the background talking at the same time. Pt states he feels unbalanced on his feet and has vertigo. Pt wanted appt and was scheduled.

## 2019-03-12 NOTE — Telephone Encounter (Signed)
Patient would like to know if the case of shingles he had about 4 weeks ago could be causing his light headedness?   Please advise.

## 2019-03-13 ENCOUNTER — Other Ambulatory Visit: Payer: Self-pay

## 2019-03-13 DIAGNOSIS — I4821 Permanent atrial fibrillation: Secondary | ICD-10-CM | POA: Diagnosis not present

## 2019-03-13 DIAGNOSIS — R42 Dizziness and giddiness: Secondary | ICD-10-CM | POA: Diagnosis not present

## 2019-03-14 ENCOUNTER — Encounter: Payer: Self-pay | Admitting: Physician Assistant

## 2019-03-14 ENCOUNTER — Other Ambulatory Visit: Payer: Self-pay

## 2019-03-14 ENCOUNTER — Other Ambulatory Visit: Payer: Self-pay | Admitting: *Deleted

## 2019-03-14 ENCOUNTER — Telehealth: Payer: Self-pay | Admitting: Physician Assistant

## 2019-03-14 ENCOUNTER — Ambulatory Visit (INDEPENDENT_AMBULATORY_CARE_PROVIDER_SITE_OTHER): Payer: Medicare Other | Admitting: Family Medicine

## 2019-03-14 VITALS — BP 137/73 | HR 71 | Temp 97.4°F | Resp 18 | Ht 74.0 in | Wt 201.4 lb

## 2019-03-14 DIAGNOSIS — R2681 Unsteadiness on feet: Secondary | ICD-10-CM | POA: Diagnosis not present

## 2019-03-14 DIAGNOSIS — I4891 Unspecified atrial fibrillation: Secondary | ICD-10-CM

## 2019-03-14 DIAGNOSIS — I679 Cerebrovascular disease, unspecified: Secondary | ICD-10-CM | POA: Diagnosis not present

## 2019-03-14 DIAGNOSIS — B0229 Other postherpetic nervous system involvement: Secondary | ICD-10-CM | POA: Diagnosis not present

## 2019-03-14 DIAGNOSIS — E782 Mixed hyperlipidemia: Secondary | ICD-10-CM | POA: Diagnosis not present

## 2019-03-14 DIAGNOSIS — G319 Degenerative disease of nervous system, unspecified: Secondary | ICD-10-CM

## 2019-03-14 DIAGNOSIS — E119 Type 2 diabetes mellitus without complications: Secondary | ICD-10-CM | POA: Diagnosis not present

## 2019-03-14 DIAGNOSIS — R82998 Other abnormal findings in urine: Secondary | ICD-10-CM | POA: Diagnosis not present

## 2019-03-14 DIAGNOSIS — E875 Hyperkalemia: Secondary | ICD-10-CM

## 2019-03-14 DIAGNOSIS — Z7901 Long term (current) use of anticoagulants: Secondary | ICD-10-CM | POA: Diagnosis not present

## 2019-03-14 DIAGNOSIS — R531 Weakness: Secondary | ICD-10-CM | POA: Diagnosis not present

## 2019-03-14 DIAGNOSIS — R5383 Other fatigue: Secondary | ICD-10-CM

## 2019-03-14 LAB — CBC WITH DIFFERENTIAL/PLATELET
Basophils Absolute: 0 10*3/uL (ref 0.0–0.1)
Basophils Relative: 0.3 % (ref 0.0–3.0)
Eosinophils Absolute: 0.1 10*3/uL (ref 0.0–0.7)
Eosinophils Relative: 2.3 % (ref 0.0–5.0)
HCT: 43.7 % (ref 39.0–52.0)
Hemoglobin: 15 g/dL (ref 13.0–17.0)
Lymphocytes Relative: 18.5 % (ref 12.0–46.0)
Lymphs Abs: 1 10*3/uL (ref 0.7–4.0)
MCHC: 34.2 g/dL (ref 30.0–36.0)
MCV: 98.3 fl (ref 78.0–100.0)
Monocytes Absolute: 0.5 10*3/uL (ref 0.1–1.0)
Monocytes Relative: 9.2 % (ref 3.0–12.0)
Neutro Abs: 3.9 10*3/uL (ref 1.4–7.7)
Neutrophils Relative %: 69.7 % (ref 43.0–77.0)
Platelets: 201 10*3/uL (ref 150.0–400.0)
RBC: 4.45 Mil/uL (ref 4.22–5.81)
RDW: 14.2 % (ref 11.5–15.5)
WBC: 5.6 10*3/uL (ref 4.0–10.5)

## 2019-03-14 LAB — COMPREHENSIVE METABOLIC PANEL
ALT: 15 U/L (ref 0–53)
AST: 13 U/L (ref 0–37)
Albumin: 4.4 g/dL (ref 3.5–5.2)
Alkaline Phosphatase: 61 U/L (ref 39–117)
BUN: 13 mg/dL (ref 6–23)
CO2: 29 mEq/L (ref 19–32)
Calcium: 9.8 mg/dL (ref 8.4–10.5)
Chloride: 99 mEq/L (ref 96–112)
Creatinine, Ser: 0.91 mg/dL (ref 0.40–1.50)
GFR: 81.68 mL/min (ref >60.00)
Glucose, Bld: 137 mg/dL — ABNORMAL HIGH (ref 70–99)
Potassium: 5.5 mEq/L — ABNORMAL HIGH (ref 3.5–5.1)
Sodium: 134 mEq/L — ABNORMAL LOW (ref 135–145)
Total Bilirubin: 0.9 mg/dL (ref 0.2–1.2)
Total Protein: 6.9 g/dL (ref 6.0–8.3)

## 2019-03-14 LAB — TSH: TSH: 1.12 u[IU]/mL (ref 0.35–4.50)

## 2019-03-14 MED ORDER — GABAPENTIN 100 MG PO CAPS: ORAL_CAPSULE | ORAL | 0 refills | Status: DC

## 2019-03-14 NOTE — Telephone Encounter (Signed)
Pt says he already spoke with another nurse re: his results and agrees to having an Echocardiogram... order has been placed.

## 2019-03-14 NOTE — Telephone Encounter (Signed)
New Message             Patient is returning Christine's call and would like a call back.

## 2019-03-14 NOTE — Progress Notes (Signed)
Mike Day , Feb 17, 1946, 72 y.o., male MRN: [id number] Patient Care Team    Relationship Specialty Notifications Start End  Ma Hillock, DO PCP - General Family Medicine  06/11/18   Sherren Mocha, MD PCP - Cardiology Cardiology Admissions 06/22/18    Comment: PA:  [name], PA-C  Rod Can, MD Consulting Physician Orthopedic Surgery  10/03/15   Sharmon Revere Physician Assistant Cardiology  04/29/16   Rolm Bookbinder, MD Consulting Physician Dermatology  08/24/17   Myrlene Broker, MD Attending Physician Urology  06/11/18   Warden Fillers, MD Consulting Physician Ophthalmology  06/11/18     Chief Complaint  Patient presents with  . Dizziness    Pt complains of being unsteady on feet and dizziness x2 weeks. Happens when he goes from sitting to standing position.   . Neck Pain    Pt states he has had neck soreness since shingles      Subjective: Pt presents for an OV with complaints of of dizziness and intermittent unsteadiness on his feet for 2 weeks duration.  He has a significant medical history of atrial fibrillation, hypertension, diabetes, hyperkalemia, hyperlipidemia, cerebral atrophy, cerebral artery disease and on chronic anticoagulation.  Associated symptoms include symptoms are worsened with transitioning positions.  He denies chest pain, shortness of breath, lower extremity edema.  He does endorse feeling unsteady on steps.  He has difficulty elaborating on what he means by "unsteady ". Patient had a history of vertigo last year with a resolution in his symptoms with vestibular rehab.  MRI at that time September/2019 was without acute intracranial abnormality.  However, he had some generalized cerebral atrophy and mild to moderate chronic small vessel ischemic disease.  Cardiac conditions are managed by his cardiology team.  He is prescribed Eliquis, lisinopril, amlodipine and simvastatin. Closely monitors his blood pressures and he is not reporting any  low blood pressures during these events.  Blood pressures are well maintained mostly around 315 systolic and 94V diastolic.  He has declined treatment of his diabetes, last A1c 6.9.  Reports he is still working on his diet. He has had no recent illness other than a shingles outbreak along the left side of his neck and left ear.  He was evaluated this by specialist, which day he did not have a complication of his shingles.  He denies any ringing in his ears, hearing changes or visual changes.  He also complains of continued burning sensation associated with the location of his recent shingles infection.  He reports the lesions have finally cleared up completely.  He denies any ringing of his ears or visual changes.  He was seen by dermatology for diagnosis.  He was evaluated by ophthalmologist and ENT for complications considering location over left ear/face/neck.  He states he has heard of a medication that can help decrease the nerve pain and is interested in discussing today.  Depression screen [facility] 2/9 02/26/2019 06/05/2018 08/24/2017 06/05/2015 07/02/2013  Decreased Interest 0 0 0 0 0  Down, Depressed, Hopeless 0 0 0 0 0  PHQ - 2 Score 0 0 0 0 0    Allergies  Allergen Reactions  . Levofloxacin Rash  . Neosporin [Neomycin-Bacitracin Zn-Polymyx] Other (See Comments)    Dry/scaly skin  . Tape Other (See Comments)    ADHESIVE TAPE; BLISTERS,   Social History   Social History Narrative   Married, 1 son, 2 grand-daughters.   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   Alcohol: 1 glass red wine per night.   Was once a long distance runner.   Past Medical History:  Diagnosis Date  . Anemia    Early 20's  . Atrial fibrillation ([place]) 10/07/2014   3-14 day cardiac monitor 03/2019:  AFib, Avg HR 69, no significant pauses, PVCs, wide complex runs (aberrancy vs NSVT - longest 32 beats).  . Atrial flutter [facility]) 2008   Status post [facility] Dr Cristopher [place] 2008   . Chronic atrial fibrillation    Eliquis  . Diabetes mellitus without complication ([place])    Dx by A1c criteria 2014  . History of echocardiogram    Echo 12/14Severe basal septal hypertrophy, moderate LVH, EF 60-65%, moderate LAE, mild RAE   . Hx of adenomatous colonic polyps   . Hyperlipidemia   . Hypertension   . Malignant neoplasm of prostate ([place]) 05/18/2012  . Prostate cancer [facility]) 2006   Prostatectomy  . Shingles 01/2019   left side of face/eye/neck/ear    Past Surgical History:  Procedure Laterality Date  . COLONOSCOPY W/ POLYPECTOMY  2003;2010;04/2014   Tubular adenoma: recall 5 yrs (after 04/2019)  . HERNIA REPAIR  07/2008   Dr.Martin  . MOHS SURGERY     left hand  . 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 RADIOFREQUENCY ABLATION  01/15/2007   for ectopic atrial foci  . SEPTOPLASTY  Age 85  . TRANSTHORACIC ECHOCARDIOGRAM  09/02/2013   Normal.  EF 60%.  LAE and RAE.   Family History  Problem Relation Age of Onset  . Lung disease Father        Black Lung  . Heart disease Father        Congenital  . Emphysema Father   . COPD Father   . Diabetes Mother   . Stroke Mother 42  . Hypertension Brother   . Heart disease Brother   . Hyperlipidemia Brother   . Heart attack Brother 75       sudden death  . Cancer Neg Hx    Allergies as of 03/14/2019      Reactions   Levofloxacin Rash   Neosporin [neomycin-bacitracin Zn-polymyx] Other (See Comments)   Dry/scaly skin   Tape Other (See Comments)   ADHESIVE TAPE; BLISTERS,      Medication List       Accurate as of March 14, 2019 11:59 PM. If you have any questions, ask your nurse or doctor.        amLODipine 2.5 MG tablet Commonly known as: NORVASC Take 1 tablet (2.5 mg total) by mouth 2 (two) times a day.   Eliquis 5 MG Tabs tablet Generic drug: apixaban TAKE 1 TABLET TWICE A DAY   gabapentin 100 MG capsule Commonly known as: NEURONTIN 1 tab before bed for 3 days, then increase to  one tab BID for 3 days, then 1 tab TID Started by: Howard Pouch, DO   lisinopril 40 MG tablet Commonly known as: ZESTRIL Take 1 tablet (40 mg total) by mouth daily.   simvastatin 20 MG tablet Commonly known as: ZOCOR Take 1 tablet (20 mg total) by mouth at bedtime.       All past medical history, surgical history, allergies, family history, immunizations andmedications were updated in the EMR today and reviewed under the history and medication portions of their EMR.     ROS: Negative, with the exception of above mentioned in HPI  Objective:  BP 137/73 (BP Location: Right Arm, Patient Position: Sitting, Cuff Size: Normal)   Pulse 71   Temp (!) 97.4 F (36.3 C) (Temporal)   Resp 18   Ht '6\' 2"'  (1.88 m)   Wt 201 lb 6 oz (91.3 kg)   SpO2 98%   BMI 25.86 kg/m  Body mass index is 25.86 kg/m. Gen: Afebrile. No acute distress. Nontoxic in appearance, well developed, well nourished.  HENT: AT. [place]. mildly dry mucous membranes, no oral lesions.  Eyes:Pupils Equal Round Reactive to light, Extraocular movements intact,  Conjunctiva without redness, discharge or icterus. Neck/lymp/endocrine: Supple,no lymphadenopathy CV: RRR no murmur, no edema Chest: CTAB, no wheeze or crackles. Good air movement, normal resp effort.  Skin: no rashes- shingles lesions well healed,no purpura or petechiae.  Neuro: Normal gait. PERLA. EOMi. Alert. Oriented x3 Cranial nerves II through XII intact. Muscle strength 5/5 BL extremity.  Negative Kernig's Psych: Normal affect, dress and demeanor. Normal speech. Normal thought content and judgment.  No exam data present No results found. No results found for this or any previous visit (from the past 24 hour(s)).  Assessment/Plan: Mike Day is a 73 y.o. male present for OV for  Weakness/Unsteady/fatigue/Hyperkalemia/cerebral vessel disease/cerebral atrophy - hydrate. Urine should be very pale yellow- almost clear. He states his urine has been dark  golden.  - avoid potassium rich foods or salt substitutes.  - consider carotid duplex.  - consider repeating MRI- brain - consider neuro referral -cerebral atrophy/cerebral vessel disease on MRI 05/2018 - BMP, TSH, CBC and urine studies collected today.  - Further work up dependent on results.   Dark urine Hydrate. Dehydration could be contributor in symptoms. 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  - He reports work up on ear and eye for shingles complication was normal.  - discussed trial of gabapentin for his neuralgia (with sedation precautions  and tapering instructions)- he would like to try this today.  - gabapentin 100 mg taper to TID  - CBC  Atrial fibrillation, unspecified type (HCC)/Chronic anticoagulation Mixed hyperlipidemia Managed by cardiology. He states he has an upcoming appt to discuss his cardiac monitoring.  - CBC, TSH, CMP - hyperkalemia could be playing a role- although potassium highest 5.4>> then dropped to 5.3 last week by lab collection>> retest today.  - may need to consider lowering lisinopril dose and increasing other BP regimen. Considered lasix- however with current signs of dehydration do not feel this is a good option for him at this time.   Diabetes mellitus without complication ([place]) He is not accepting of diabetes diagnosis- last a1c 6.9. He declines medication and wants to continue to work harder on his diet control and exercise.  - education provided to him today surrounding diabetes and potential risk associated without treatment and control of diabetes condition. Including increase in CV risk.  Discussed hyperglycemia can cause dizziness and even potassium shifts if high enough.    Reviewed expectations re: course of current medical issues.  Discussed self-management of symptoms.  Outlined signs and symptoms  indicating need for more acute intervention.  Patient verbalized understanding and all questions were answered.  Patient received an After-Visit Summary.    Orders Placed This Encounter  Procedures  . TSH  . CBC w/Diff  . Comp Met (CMET)  . Urinalysis w microscopic + reflex cultur  . REFLEXIVE URINE CULTURE    >  25 minutes spent with patient, >50% of time spent face to face    Note is dictated utilizing voice recognition software. Although note has been proof read prior to signing, occasional typographical errors still can be missed. If any questions arise, please do not hesitate to call for verification.   electronically signed by:  Howard Pouch, DO  [place]

## 2019-03-14 NOTE — Patient Instructions (Signed)
Gabapentin for shingles pain>>> 1 tab before bed for 3 days, then increase to one tab every 12 hours for 3 days, then 1 tab every 8 hours .    Hydrate- at least 80-100 ounces of water a day as long as not seeing fluid in your legs.   We will call you with lab results once available.    Orthostatic Hypotension Blood pressure is a measurement of how strongly, or weakly, your blood is pressing against the walls of your arteries. Orthostatic hypotension is a sudden drop in blood pressure that happens when you quickly change positions, such as when you get up from sitting or lying down. Arteries are blood vessels that carry blood from your heart throughout your body. When blood pressure is too low, you may not get enough blood to your brain or to the rest of your organs. This can cause weakness, light-headedness, rapid heartbeat, and fainting. This can last for just a few seconds or for up to a few minutes. Orthostatic hypotension is usually not a serious problem. However, if it happens frequently or gets worse, it may be a sign of something more serious. What are the causes? This condition may be caused by:  Sudden changes in posture, such as standing up quickly after you have been sitting or lying down.  Blood loss.  Loss of body fluids (dehydration).  Heart problems.  Hormone (endocrine) problems.  Pregnancy.  Severe infection.  Lack of certain nutrients.  Severe allergic reactions (anaphylaxis).  Certain medicines, such as blood pressure medicine or medicines that make the body lose excess fluids (diuretics). Sometimes, this condition can be caused by not taking medicine as directed, such as taking too much of a certain medicine. What increases the risk? The following factors may make you more likely to develop this condition:  Age. Risk increases as you get older.  Conditions that affect the heart or the central nervous system.  Taking certain medicines, such as blood pressure  medicine or diuretics.  Being pregnant. What are the signs or symptoms? Symptoms of this condition may include:  Weakness.  Light-headedness.  Dizziness.  Blurred vision.  Fatigue.  Rapid heartbeat.  Fainting, in severe cases. How is this diagnosed? This condition is diagnosed based on:  Your medical history.  Your symptoms.  Your blood pressure measurement. Your health care provider will check your blood pressure when you are: ? Lying down. ? Sitting. ? Standing. A blood pressure reading is recorded as two numbers, such as "120 over 80" (or 120/80). The first ("top") number is called the systolic pressure. It is a measure of the pressure in your arteries as your heart beats. The second ("bottom") number is called the diastolic pressure. It is a measure of the pressure in your arteries when your heart relaxes between beats. Blood pressure is measured in a unit called mm Hg. Healthy blood pressure for most adults is 120/80. If your blood pressure is below 90/60, you may be diagnosed with hypotension. Other information or tests that may be used to diagnose orthostatic hypotension include:  Your other vital signs, such as your heart rate and temperature.  Blood tests.  Tilt table test. For this test, you will be safely secured to a table that moves you from a lying position to an upright position. Your heart rhythm and blood pressure will be monitored during the test. How is this treated? This condition may be treated by:  Changing your diet. This may involve eating more salt (sodium) or  drinking more water.  Taking medicines to raise your blood pressure.  Changing the dosage of certain medicines you are taking that might be lowering your blood pressure.  Wearing compression stockings. These stockings help to prevent blood clots and reduce swelling in your legs. In some cases, you may need to go to the hospital for:  Fluid replacement. This means you will receive fluids  through an IV.  Blood replacement. This means you will receive donated blood through an IV (transfusion).  Treating an infection or heart problems, if this applies.  Monitoring. You may need to be monitored while medicines that you are taking wear off. Follow these instructions at home: Eating and drinking   Drink enough fluid to keep your urine pale yellow.  Eat a healthy diet, and follow instructions from your health care provider about eating or drinking restrictions. A healthy diet includes: ? Fresh fruits and vegetables. ? Whole grains. ? Lean meats. ? Low-fat dairy products.  Eat extra salt only as directed. Do not add extra salt to your diet unless your health care provider told you to do that.  Eat frequent, small meals.  Avoid standing up suddenly after eating. Medicines  Take over-the-counter and prescription medicines only as told by your health care provider. ? Follow instructions from your health care provider about changing the dosage of your current medicines, if this applies. ? Do not stop or adjust any of your medicines on your own. General instructions   Wear compression stockings as told by your health care provider.  Get up slowly from lying down or sitting positions. This gives your blood pressure a chance to adjust.  Avoid hot showers and excessive heat as directed by your health care provider.  Return to your normal activities as told by your health care provider. Ask your health care provider what activities are safe for you.  Do not use any products that contain nicotine or tobacco, such as cigarettes, e-cigarettes, and chewing tobacco. If you need help quitting, ask your health care provider.  Keep all follow-up visits as told by your health care provider. This is important. Contact a health care provider if you:  Vomit.  Have diarrhea.  Have a fever for more than 2-3 days.  Feel more thirsty than usual.  Feel weak and tired. Get help  right away if you:  Have chest pain.  Have a fast or irregular heartbeat.  Develop numbness in any part of your body.  Cannot move your arms or your legs.  Have trouble speaking.  Become sweaty or feel light-headed.  Faint.  Feel short of breath.  Have trouble staying awake.  Feel confused. Summary  Orthostatic hypotension is a sudden drop in blood pressure that happens when you quickly change positions.  Orthostatic hypotension is usually not a serious problem.  It is diagnosed by having your blood pressure taken lying down, sitting, and then standing.  It may be treated by changing your diet or adjusting your medicines. This information is not intended to replace advice given to you by your health care provider. Make sure you discuss any questions you have with your health care provider. Document Released: 08/12/2002 Document Revised: 02/15/2018 Document Reviewed: 02/15/2018 Elsevier Patient Education  2020 [employer].

## 2019-03-15 ENCOUNTER — Encounter: Payer: Self-pay | Admitting: Family Medicine

## 2019-03-15 ENCOUNTER — Telehealth: Payer: Self-pay | Admitting: Family Medicine

## 2019-03-15 DIAGNOSIS — E875 Hyperkalemia: Secondary | ICD-10-CM | POA: Insufficient documentation

## 2019-03-15 DIAGNOSIS — B0229 Other postherpetic nervous system involvement: Secondary | ICD-10-CM | POA: Insufficient documentation

## 2019-03-15 DIAGNOSIS — I679 Cerebrovascular disease, unspecified: Secondary | ICD-10-CM | POA: Insufficient documentation

## 2019-03-15 DIAGNOSIS — G319 Degenerative disease of nervous system, unspecified: Secondary | ICD-10-CM | POA: Insufficient documentation

## 2019-03-15 HISTORY — DX: Other postherpetic nervous system involvement: B02.29

## 2019-03-15 LAB — URINALYSIS W MICROSCOPIC + REFLEX CULTURE
Bacteria, UA: NONE SEEN /HPF
Bilirubin Urine: NEGATIVE
Glucose, UA: NEGATIVE
Hgb urine dipstick: NEGATIVE
Hyaline Cast: NONE SEEN /LPF
Ketones, ur: NEGATIVE
Leukocyte Esterase: NEGATIVE
Nitrites, Initial: NEGATIVE
Protein, ur: NEGATIVE
Specific Gravity, Urine: 1.018 (ref 1.001–1.03)
Squamous Epithelial / HPF: NONE SEEN /HPF (ref <=5)
WBC, UA: NONE SEEN /HPF (ref 0–5)
pH: <=5 (ref 5.0–8.0)

## 2019-03-15 LAB — NO CULTURE INDICATED

## 2019-03-15 NOTE — Telephone Encounter (Signed)
Called pt and informed him of the following results concerning his dizziness/balance issues.  - His labs resulted with  potassium  Elevated again to 5.5 (NL is 5.1). His lisinopril can cause him to hold on to potassium. When potassium levels are elevated and a pt is on lisinopril we attempt to reduce the dose- which means he will need to increase in the amlodipine to cover his BP- suggest he go through his cardiologist for changes since they have managed all his cardiac meds and conditions. Pt was counseled elevated potassium can cause HR disturbances- which can lead to dizziness.  Carotid artery stenosis can also cause dizziness. Suggested he also discuss with his cardiologist since they scheduled and echo at their location this month- may be able to complete both there. If they can not, I  will be happy to order for him - sodium is lower than normal at 134 (135 normal). It could be a contributor to his symptoms- but unlikely at that level.  - urine studies are also normal Hydrate.   Pt reports understanding and he will call his cardiologist on Monday. I will also communicate with them via epic message.

## 2019-03-17 ENCOUNTER — Telehealth: Payer: Self-pay | Admitting: Physician Assistant

## 2019-03-17 DIAGNOSIS — R42 Dizziness and giddiness: Secondary | ICD-10-CM

## 2019-03-17 MED ORDER — LISINOPRIL 40 MG PO TABS: 20.0000 mg | ORAL_TABLET | Freq: Every day | ORAL | 3 refills | Status: DC

## 2019-03-17 NOTE — Telephone Encounter (Signed)
See phone note from 03/17/2019. [name], PA-C    03/17/2019 2:58 PM

## 2019-03-17 NOTE — Telephone Encounter (Signed)
-----   Message from Ma Hillock, DO sent at 03/15/2019  5:30 PM EDT ----- Regarding: mutual pt Hello Zanaya Baize.  I am Mr. Hlavaty PCP.  He has been complaining of dizziness/gait instability that seems to be positional for the last 2 weeks.  His potassium has been mildly elevated over this time and last check was 5.5.  We discussed higher potassiums can cause certain rhythm irregularities.  He does not seem to be consuming any additional potassium or high potassium meals.  He is however on lisinopril.   I also see you have ordered an echocardiogram for him within a few weeks.  I have recommended we consider a carotid plaque study for further work-up on his balance/dizziness complaints.  I informed him I would make contact with you to see if you could add the carotid duplex to be completed at your location with his echocardiogram.  If you cannot, I will be happy to place order for him at a different location.  Since you manage his blood pressure medications.  I have asked him to contact you about considering lowering his lisinopril dose to help decrease his potassium.  Thanks.  Dr. Evangeline Dakin- OR

## 2019-03-17 NOTE — Telephone Encounter (Signed)
Please call the patient.  His PCP reached out to me regarding his blood pressure medication, high potassium and dizziness. PLAN:  1. Decrease Lisinopril to 20 mg once daily (I changed his med list) 2. Arrange Carotid US (order placed) 3. Arrange repeat BMET in 1 week 4. Monitor BP.  If BP consistently > 130/80, call so we can adjust his Amlodipine.  [name], PA-C    03/17/2019 2:55 PM

## 2019-03-18 DIAGNOSIS — B0239 Other herpes zoster eye disease: Secondary | ICD-10-CM | POA: Diagnosis not present

## 2019-03-18 NOTE — Telephone Encounter (Signed)
Spoke with the patient, he expressed understanding about changing his medication and accepted the lab appointment on 03/25/2019. Message sent to scheduling regarding carotid ultrasound.

## 2019-03-19 ENCOUNTER — Other Ambulatory Visit: Payer: Self-pay | Admitting: *Deleted

## 2019-03-19 ENCOUNTER — Ambulatory Visit (HOSPITAL_COMMUNITY)
Admission: RE | Admit: 2019-03-19 | Discharge: 2019-03-19 | Disposition: A | Discharge status: Home / Self Care | Payer: Medicare Other | Source: Ambulatory Visit | Attending: Cardiology | Admitting: Cardiology

## 2019-03-19 ENCOUNTER — Other Ambulatory Visit: Payer: Self-pay | Admitting: Physician Assistant

## 2019-03-19 ENCOUNTER — Other Ambulatory Visit: Payer: Self-pay

## 2019-03-19 DIAGNOSIS — R42 Dizziness and giddiness: Secondary | ICD-10-CM | POA: Insufficient documentation

## 2019-03-19 DIAGNOSIS — I679 Cerebrovascular disease, unspecified: Secondary | ICD-10-CM

## 2019-03-25 ENCOUNTER — Other Ambulatory Visit: Payer: Self-pay

## 2019-03-25 ENCOUNTER — Other Ambulatory Visit: Payer: Medicare Other | Admitting: *Deleted

## 2019-03-25 DIAGNOSIS — R42 Dizziness and giddiness: Secondary | ICD-10-CM | POA: Diagnosis not present

## 2019-03-25 LAB — BASIC METABOLIC PANEL
BUN/Creatinine Ratio: 14 (ref 10–24)
BUN: 13 mg/dL (ref 8–27)
CO2: 22 mmol/L (ref 20–29)
Calcium: 9.5 mg/dL (ref 8.6–10.2)
Chloride: 95 mmol/L — ABNORMAL LOW (ref 96–106)
Creatinine, Ser: 0.94 mg/dL (ref 0.76–1.27)
GFR calc Af Amer: 93 mL/min/{1.73_m2} (ref >59)
GFR calc non Af Amer: 81 mL/min/{1.73_m2} (ref >59)
Glucose: 141 mg/dL — ABNORMAL HIGH (ref 65–99)
Potassium: 4.1 mmol/L (ref 3.5–5.2)
Sodium: 133 mmol/L — ABNORMAL LOW (ref 134–144)

## 2019-03-28 ENCOUNTER — Ambulatory Visit (INDEPENDENT_AMBULATORY_CARE_PROVIDER_SITE_OTHER): Payer: Medicare Other | Admitting: Orthopedic Surgery

## 2019-03-28 ENCOUNTER — Encounter: Payer: Self-pay | Admitting: Orthopedic Surgery

## 2019-03-28 ENCOUNTER — Other Ambulatory Visit: Payer: Self-pay

## 2019-03-28 VITALS — Ht 74.0 in | Wt 201.0 lb

## 2019-03-28 DIAGNOSIS — L97511 Non-pressure chronic ulcer of other part of right foot limited to breakdown of skin: Secondary | ICD-10-CM | POA: Diagnosis not present

## 2019-04-01 ENCOUNTER — Encounter: Payer: Self-pay | Admitting: Gastroenterology

## 2019-04-01 ENCOUNTER — Encounter: Payer: Self-pay | Admitting: Orthopedic Surgery

## 2019-04-01 NOTE — Progress Notes (Signed)
Office Visit Note   Patient: Mike Day           Date of Birth: [birth year]           MRN: [id number] Visit Date: 03/28/2019              Requested by: Ma Hillock, DO [address] [place],  [place] [address] PCP: Ma Hillock, DO  Chief Complaint  Patient presents with  . Right Foot - Follow-up    2nd MTH ulcer       HPI: Patient is a 73 year old gentleman who presents in follow-up for right foot second metatarsal head ulcer.  He states it is open denies any drainage has been weightbearing in regular shoewear.  He has been doing Achilles stretching and wearing orthotics.  Assessment & Plan: Visit Diagnoses:  1. Non-pressure chronic ulcer of other part of right foot limited to breakdown of skin ([place])     Plan: A felt pad was placed beneath his orthotic to further unload the second metatarsal head.  Continue with his protective shoe wear.  Follow-Up Instructions: Return in about 4 weeks (around 04/25/2019).   Ortho Exam  Patient is alert, oriented, no adenopathy, well-dressed, normal affect, normal respiratory effort. Examination patient has a palpable dorsalis pedis pulse he has an ulcer beneath the second metatarsal head which is smaller.  After informed consent a 10 blade knife was used to debride the skin and soft tissue back to healthy viable granulation tissue the ulcer is 10 mm in diameter 1 mm deep there is no exposed bone or tendon.  A felt pad was placed beneath the orthotic to unload the second metatarsal head.  Imaging: No results found. No images are attached to the encounter.  Labs: Lab Results  Component Value Date   HGBA1C 6.9 (H) 02/26/2019   HGBA1C 6.3 (A) 06/05/2018   HGBA1C 6.9 (H) 02/06/2018     Lab Results  Component Value Date   ALBUMIN 4.4 03/14/2019   ALBUMIN 4.2 02/26/2019   ALBUMIN 4.4 05/15/2018    Lab Results  Component Value Date   MG 2.1 06/08/2012   No results found for: VD25OH  No results found for: PREALBUMIN CBC  EXTENDED Latest Ref Rng & Units 03/14/2019 02/26/2019 05/15/2018  WBC 4.0 - 10.5 K/uL 5.6 6.1 7.0  RBC 4.22 - 5.81 Mil/uL 4.45 4.35 4.68  HGB 13.0 - 17.0 g/dL 15.0 14.6 15.9  HCT 39.0 - 52.0 % 43.7 43.0 45.3  PLT 150.0 - 400.0 K/uL 201.0 203.0 207.0  NEUTROABS 1.4 - 7.7 K/uL 3.9 4.2 5.1  LYMPHSABS 0.7 - 4.0 K/uL 1.0 1.3 1.2     Body mass index is 25.81 kg/m.  Orders:  No orders of the defined types were placed in this encounter.  No orders of the defined types were placed in this encounter.    Procedures: No procedures performed  Clinical Data: No additional findings.  ROS:  All other systems negative, except as noted in the HPI. Review of Systems  Objective: Vital Signs: Ht 6\' 2"  (1.88 m)   Wt 201 lb (91.2 kg)   BMI 25.81 kg/m   Specialty Comments:  No specialty comments available.  PMFS History: Patient Active Problem List   Diagnosis Date Noted  . Post herpetic neuralgia 03/15/2019  . Hyperkalemia 03/15/2019  . Cerebral atrophy ([place]) 03/15/2019  . Cerebral arterial disease 03/15/2019  . Atrial fibrillation ([place]) 10/07/2014  . Dermatochalasis of both upper eyelids 07/01/2014  . Chronic  anticoagulation 04/14/2014  . 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 Diabetes mellitus without complication ([place]) 33/54/5625  . SKIN CANCER, HX OF 03/11/2009  . Essential hypertension 09/17/2008  . Hyperlipidemia 11/01/2007  . PROSTATE CANCER, HX OF 01/18/2007  . Tubular adenoma of colon 01/12/2007   Past Medical History:  Diagnosis Date  . Anemia    Early 20's  . Atrial fibrillation ([place]) 10/07/2014   3-14 day cardiac monitor 03/2019:  AFib, Avg HR 69, no significant pauses, PVCs, wide complex runs (aberrancy vs NSVT - longest 32 beats).  . Atrial flutter [facility]) 2008   Status post RFCA Dr Cristopher Peru 2008  . Chronic atrial fibrillation    Eliquis  . Diabetes mellitus without complication ([place])    Dx by A1c criteria 2014  . History of echocardiogram     Echo 12/14Severe basal septal hypertrophy, moderate LVH, EF 60-65%, moderate LAE, mild RAE   . Hx of adenomatous colonic polyps   . Hyperlipidemia   . Hypertension   . Malignant neoplasm of prostate ([place]) 05/18/2012  . Prostate cancer [facility]) 2006   Prostatectomy  . Shingles 01/2019   left side of face/eye/neck/ear     Family History  Problem Relation Age of Onset  . Lung disease Father        Black Lung  . Heart disease Father        Congenital  . Emphysema Father   . COPD Father   . Diabetes Mother   . Stroke Mother 72  . Hypertension Brother   . Heart disease Brother   . Hyperlipidemia Brother   . Heart attack Brother 42       sudden death  . Cancer Neg Hx     Past Surgical History:  Procedure Laterality Date  . COLONOSCOPY W/ POLYPECTOMY  2003;2010;04/2014   Tubular adenoma: recall 5 yrs (after 04/2019)  . HERNIA REPAIR  07/2008   Dr.Martin  . MOHS SURGERY     left hand  . 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 RADIOFREQUENCY ABLATION  01/15/2007   for ectopic atrial foci  . SEPTOPLASTY  Age 36  . TRANSTHORACIC ECHOCARDIOGRAM  09/02/2013   Normal.  EF 60%.  LAE and RAE.   Social History   Occupational History  . Not on file  Tobacco Use  . Smoking status: Former Smoker    Quit date: 09/06/2007    Years since quitting: 11.5  . Smokeless tobacco: Never Used  . Tobacco comment: smoked 1963 -2009; total 5 packs of cigarettes in life . Cigars < 1 / day on average 1992-2009  Substance and Sexual Activity  . Alcohol use: Yes    Alcohol/week: 7.0 standard drinks    Types: 7 Glasses of wine per week    Comment: Socially  . Drug use: No  . Sexual activity: Not Currently    Partners: Female

## 2019-04-02 ENCOUNTER — Ambulatory Visit (HOSPITAL_COMMUNITY): Payer: Medicare Other | Attending: Internal Medicine

## 2019-04-02 ENCOUNTER — Other Ambulatory Visit: Payer: Self-pay

## 2019-04-02 DIAGNOSIS — I4891 Unspecified atrial fibrillation: Secondary | ICD-10-CM

## 2019-04-04 ENCOUNTER — Encounter: Payer: Self-pay | Admitting: Physician Assistant

## 2019-04-10 ENCOUNTER — Encounter: Payer: Self-pay | Admitting: Family Medicine

## 2019-04-21 ENCOUNTER — Encounter: Payer: Self-pay | Admitting: Orthopedic Surgery

## 2019-04-22 ENCOUNTER — Encounter: Payer: Self-pay | Admitting: Orthopedic Surgery

## 2019-04-22 ENCOUNTER — Encounter: Payer: Self-pay | Admitting: Family Medicine

## 2019-04-25 ENCOUNTER — Encounter: Payer: Self-pay | Admitting: Orthopedic Surgery

## 2019-04-25 ENCOUNTER — Ambulatory Visit (INDEPENDENT_AMBULATORY_CARE_PROVIDER_SITE_OTHER): Payer: Medicare Other | Admitting: Orthopedic Surgery

## 2019-04-25 VITALS — Ht 74.0 in | Wt 201.0 lb

## 2019-04-25 DIAGNOSIS — L97511 Non-pressure chronic ulcer of other part of right foot limited to breakdown of skin: Secondary | ICD-10-CM

## 2019-04-28 ENCOUNTER — Encounter: Payer: Self-pay | Admitting: Orthopedic Surgery

## 2019-04-28 NOTE — Progress Notes (Signed)
Office Visit Note   Patient: Mike Day           Date of Birth: [birth year]           MRN: [id number] Visit Date: 04/25/2019              Requested by: Ma Hillock, DO [address] [place],  [place] [address] PCP: Ma Hillock, DO  Chief Complaint  Patient presents with  . Right Foot - Follow-up      HPI: Patient is a 73 year old gentleman who presents in follow-up for Wegner grade 1 ulcer beneath the right foot second metatarsal head.  Patient is concerned that the areas not healing due to debridement in the office.  Assessment & Plan: Visit Diagnoses:  1. Non-pressure chronic ulcer of other part of right foot limited to breakdown of skin ([place])     Plan: The ulcer was debrided a metatarsal pad was placed to further unload the metatarsal head.  Follow-Up Instructions: Return in about 4 weeks (around 05/23/2019).   Ortho Exam  Patient is alert, oriented, no adenopathy, well-dressed, normal affect, normal respiratory effort. Examination patient has a palpable pulse he has a cavus foot with prominent metatarsal heads.  He has a Medical illustrator grade 1 ulcer beneath the second metatarsal head.  After informed consent a 10 blade knife was used to debride the skin and soft tissue down to healthy viable granulation tissue there is no exposed bone or tendon this ulcer does not probe to bone.  The ulcer is 10 mm in diameter 1 mm deep.  A metatarsal pad was placed proximal to the metatarsal heads.  Imaging: No results found. No images are attached to the encounter.  Labs: Lab Results  Component Value Date   HGBA1C 6.9 (H) 02/26/2019   HGBA1C 6.3 (A) 06/05/2018   HGBA1C 6.9 (H) 02/06/2018     Lab Results  Component Value Date   ALBUMIN 4.4 03/14/2019   ALBUMIN 4.2 02/26/2019   ALBUMIN 4.4 05/15/2018    Lab Results  Component Value Date   MG 2.1 06/08/2012   No results found for: VD25OH  No results found for: PREALBUMIN CBC EXTENDED Latest Ref Rng & Units 03/14/2019  02/26/2019 05/15/2018  WBC 4.0 - 10.5 K/uL 5.6 6.1 7.0  RBC 4.22 - 5.81 Mil/uL 4.45 4.35 4.68  HGB 13.0 - 17.0 g/dL 15.0 14.6 15.9  HCT 39.0 - 52.0 % 43.7 43.0 45.3  PLT 150.0 - 400.0 K/uL 201.0 203.0 207.0  NEUTROABS 1.4 - 7.7 K/uL 3.9 4.2 5.1  LYMPHSABS 0.7 - 4.0 K/uL 1.0 1.3 1.2     Body mass index is 25.81 kg/m.  Orders:  No orders of the defined types were placed in this encounter.  No orders of the defined types were placed in this encounter.    Procedures: No procedures performed  Clinical Data: No additional findings.  ROS:  All other systems negative, except as noted in the HPI. Review of Systems  Objective: Vital Signs: Ht _0  (1.88 m)   Wt 201 lb (91.2 kg)   BMI 25.81 kg/m   Specialty Comments:  No specialty comments available.  PMFS History: Patient Active Problem List   Diagnosis Date Noted  . Post herpetic neuralgia 03/15/2019  . Hyperkalemia 03/15/2019  . Cerebral atrophy ([place]) 03/15/2019  . Cerebral arterial disease 03/15/2019  . Atrial fibrillation ([place]) 10/07/2014  . Dermatochalasis of both upper eyelids 07/01/2014  . Chronic anticoagulation 04/14/2014  . ED (erectile dysfunction)  of organic origin 05/18/2012  . Diabetes mellitus without complication ([place]) 99/77/4142  . SKIN CANCER, HX OF 03/11/2009  . Essential hypertension 09/17/2008  . Hyperlipidemia 11/01/2007  . PROSTATE CANCER, HX OF 01/18/2007  . Tubular adenoma of colon 01/12/2007   Past Medical History:  Diagnosis Date  . Anemia    Early 20's  . Atrial fibrillation ([place]) 10/07/2014   3-14 day cardiac monitor 03/2019:  AFib, Avg HR 69, no significant pauses, PVCs, wide complex runs (aberrancy vs NSVT - longest 32 beats).  . Atrial flutter [facility]) 2008   Status post RFCA Dr Cristopher Peru 2008  . Chronic atrial fibrillation    Eliquis  . Diabetes mellitus without complication ([place])    Dx by A1c criteria 2014  . Echocardiograms    Echo 12/14 - Severe basal septal hypertrophy,  moderate LVH, EF 60-65%, moderate LAE, mild RAE // Echo 7/20: EF 60-65, severe asymmetric LVH, mild RVE, normal RV SF, moderate BAE, moderate MAC, moderate aortic valve calcification (noncoronary cusp), mild dilation of ascending aorta (41 mm)  . Hx of adenomatous colonic polyps   . Hyperlipidemia   . Hypertension   . Malignant neoplasm of prostate ([place]) 05/18/2012  . Prostate cancer [facility]) 2006   Prostatectomy  . Shingles 01/2019   left side of face/eye/neck/ear     Family History  Problem Relation Age of Onset  . Lung disease Father        Black Lung  . Heart disease Father        Congenital  . Emphysema Father   . COPD Father   . Diabetes Mother   . Stroke Mother 16  . Hypertension Brother   . Heart disease Brother   . Hyperlipidemia Brother   . Heart attack Brother 41       sudden death  . Cancer Neg Hx     Past Surgical History:  Procedure Laterality Date  . COLONOSCOPY W/ POLYPECTOMY  2003;2010;04/2014   Tubular adenoma: recall 5 yrs (after 04/2019)  . HERNIA REPAIR  07/2008   Dr.Martin  . MOHS SURGERY     left hand  . 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 RADIOFREQUENCY ABLATION  01/15/2007   for ectopic atrial foci  . SEPTOPLASTY  Age 32  . TRANSTHORACIC ECHOCARDIOGRAM  09/02/2013   Normal.  EF 60%.  LAE and RAE.   Social History   Occupational History  . Not on file  Tobacco Use  . Smoking status: Former Smoker    Quit date: 09/06/2007    Years since quitting: 11.6  . Smokeless tobacco: Never Used  . Tobacco comment: smoked 1963 -2009; total 5 packs of cigarettes in life . Cigars < 1 / day on average 1992-2009  Substance and Sexual Activity  . Alcohol use: Yes    Alcohol/week: 7.0 standard drinks    Types: 7 Glasses of wine per week    Comment: Socially  . Drug use: No  . Sexual activity: Not Currently    Partners: Female

## 2019-05-01 ENCOUNTER — Other Ambulatory Visit: Payer: Self-pay

## 2019-05-01 ENCOUNTER — Ambulatory Visit (INDEPENDENT_AMBULATORY_CARE_PROVIDER_SITE_OTHER): Payer: Medicare Other

## 2019-05-01 DIAGNOSIS — Z23 Encounter for immunization: Secondary | ICD-10-CM

## 2019-05-06 ENCOUNTER — Other Ambulatory Visit: Payer: Self-pay | Admitting: *Deleted

## 2019-05-06 MED ORDER — LISINOPRIL 20 MG PO TABS: 20.0000 mg | ORAL_TABLET | Freq: Every day | ORAL | 3 refills | Status: DC

## 2019-05-15 DIAGNOSIS — B029 Zoster without complications: Secondary | ICD-10-CM | POA: Diagnosis not present

## 2019-05-15 DIAGNOSIS — L821 Other seborrheic keratosis: Secondary | ICD-10-CM | POA: Diagnosis not present

## 2019-05-15 DIAGNOSIS — L820 Inflamed seborrheic keratosis: Secondary | ICD-10-CM | POA: Diagnosis not present

## 2019-05-15 DIAGNOSIS — Z85828 Personal history of other malignant neoplasm of skin: Secondary | ICD-10-CM | POA: Diagnosis not present

## 2019-05-15 DIAGNOSIS — L570 Actinic keratosis: Secondary | ICD-10-CM | POA: Diagnosis not present

## 2019-05-19 ENCOUNTER — Other Ambulatory Visit: Payer: Self-pay | Admitting: Physician Assistant

## 2019-05-20 ENCOUNTER — Encounter: Payer: Self-pay | Admitting: Family Medicine

## 2019-05-20 NOTE — Telephone Encounter (Signed)
10m 91.2kg Scr 0.94 03/25/19 Lovw/cooper 02/21/19

## 2019-05-23 ENCOUNTER — Ambulatory Visit (INDEPENDENT_AMBULATORY_CARE_PROVIDER_SITE_OTHER): Payer: Medicare Other | Admitting: Orthopedic Surgery

## 2019-05-23 ENCOUNTER — Encounter: Payer: Self-pay | Admitting: Orthopedic Surgery

## 2019-05-23 ENCOUNTER — Other Ambulatory Visit: Payer: Self-pay

## 2019-05-23 VITALS — Ht 74.0 in | Wt 201.0 lb

## 2019-05-23 DIAGNOSIS — L97511 Non-pressure chronic ulcer of other part of right foot limited to breakdown of skin: Secondary | ICD-10-CM | POA: Diagnosis not present

## 2019-05-25 ENCOUNTER — Encounter: Payer: Self-pay | Admitting: Orthopedic Surgery

## 2019-05-25 NOTE — Progress Notes (Signed)
Office Visit Note   Patient: Mike Day           Date of Birth: Dec 02, 1945           MRN: [id number] Visit Date: 05/23/2019              Requested by: Ma Hillock, DO [address] [place],   [address] PCP: Ma Hillock, DO  Chief Complaint  Patient presents with  . Right Foot - Follow-up      HPI: Patient is a 73 year old gentleman who presents for evaluation of a Wegner grade 1 ulcer right foot second metatarsal head.  Patient feels like he is doing well has no complaints.  Assessment & Plan: Visit Diagnoses:  1. Non-pressure chronic ulcer of other part of right foot limited to breakdown of skin ([place])     Plan: Patient was given a felt relieving pad to unload pressure beneath the second metatarsal head he will obtain more felt at 1 of the craft stores.  Follow-Up Instructions: Return if symptoms worsen or fail to improve.   Ortho Exam  Patient is alert, oriented, no adenopathy, well-dressed, normal affect, normal respiratory effort. Examination patient has improvement of the ulcer beneath the right foot second metatarsal head he has dorsiflexion to neutral of the ankle he has a good pulse after informed consent a 10 blade knife was used to debride the skin and soft tissue back to healthy viable granulation tissue.  The ulcer is 2 cm in diameter 2 mm deep there is no exposed bone or tendon no necrotic tissue no signs of infection.  Imaging: No results found. No images are attached to the encounter.  Labs: Lab Results  Component Value Date   HGBA1C 6.9 (H) 02/26/2019   HGBA1C 6.3 (A) 06/05/2018   HGBA1C 6.9 (H) 02/06/2018     Lab Results  Component Value Date   ALBUMIN 4.4 03/14/2019   ALBUMIN 4.2 02/26/2019   ALBUMIN 4.4 05/15/2018    Lab Results  Component Value Date   MG 2.1 06/08/2012   No results found for: VD25OH  No results found for: PREALBUMIN CBC EXTENDED Latest Ref Rng & Units 03/14/2019 02/26/2019 05/15/2018  WBC 4.0 - 10.5 K/uL  5.6 6.1 7.0  RBC 4.22 - 5.81 Mil/uL 4.45 4.35 4.68  HGB 13.0 - 17.0 g/dL 15.0 14.6 15.9  HCT 39.0 - 52.0 % 43.7 43.0 45.3  PLT 150.0 - 400.0 K/uL 201.0 203.0 207.0  NEUTROABS 1.4 - 7.7 K/uL 3.9 4.2 5.1  LYMPHSABS 0.7 - 4.0 K/uL 1.0 1.3 1.2     Body mass index is 25.81 kg/m.  Orders:  No orders of the defined types were placed in this encounter.  No orders of the defined types were placed in this encounter.    Procedures: No procedures performed  Clinical Data: No additional findings.  ROS:  All other systems negative, except as noted in the HPI. Review of Systems  Objective: Vital Signs: Ht _0  (1.88 m)   Wt 201 lb (91.2 kg)   BMI 25.81 kg/m   Specialty Comments:  No specialty comments available.  PMFS History: Patient Active Problem List   Diagnosis Date Noted  . Post herpetic neuralgia 03/15/2019  . Hyperkalemia 03/15/2019  . Cerebral atrophy ([place]) 03/15/2019  . Cerebral arterial disease 03/15/2019  . Atrial fibrillation ([place]) 10/07/2014  . Dermatochalasis of both upper eyelids 07/01/2014  . Chronic anticoagulation 04/14/2014  . 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  Diabetes mellitus without complication ([place]) 29/56/2130  . SKIN CANCER, HX OF 03/11/2009  . Essential hypertension 09/17/2008  . Hyperlipidemia 11/01/2007  . PROSTATE CANCER, HX OF 01/18/2007  . Tubular adenoma of colon 01/12/2007   Past Medical History:  Diagnosis Date  . Anemia    Early 20's  . Atrial fibrillation ([place]) 10/07/2014   3-14 day cardiac monitor 03/2019:  AFib, Avg HR 69, no significant pauses, PVCs, wide complex runs (aberrancy vs NSVT - longest 32 beats).  . Atrial flutter [facility]) 2008   Status post RFCA Dr Cristopher Peru 2008  . Chronic atrial fibrillation    Eliquis  . Diabetes mellitus without complication ([place])    Dx by A1c criteria 2014  . Echocardiograms    Echo 12/14 - Severe basal septal hypertrophy, moderate LVH, EF 60-65%, moderate LAE, mild  RAE // Echo 7/20: EF 60-65, severe asymmetric LVH, mild RVE, normal RV SF, moderate BAE, moderate MAC, moderate aortic valve calcification (noncoronary cusp), mild dilation of ascending aorta (41 mm)  . Hx of adenomatous colonic polyps   . Hyperlipidemia   . Hypertension   . Malignant neoplasm of prostate ([place]) 05/18/2012  . Prostate cancer [facility]) 2006   Prostatectomy  . Shingles 01/2019   left side of face/eye/neck/ear     Family History  Problem Relation Age of Onset  . Lung disease Father        Black Lung  . Heart disease Father        Congenital  . Emphysema Father   . COPD Father   . Diabetes Mother   . Stroke Mother 77  . Hypertension Brother   . Heart disease Brother   . Hyperlipidemia Brother   . Heart attack Brother 23       sudden death  . Cancer Neg Hx     Past Surgical History:  Procedure Laterality Date  . COLONOSCOPY W/ POLYPECTOMY  2003;2010;04/2014   Tubular adenoma: recall 5 yrs (after 04/2019)  . HERNIA REPAIR  07/2008   Dr.Martin  . MOHS SURGERY     left hand  . 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 RADIOFREQUENCY ABLATION  01/15/2007   for ectopic atrial foci  . SEPTOPLASTY  Age 79  . TRANSTHORACIC ECHOCARDIOGRAM  09/02/2013   Normal.  EF 60%.  LAE and RAE.   Social History   Occupational History  . Not on file  Tobacco Use  . Smoking status: Former Smoker    Quit date: 09/06/2007    Years since quitting: 11.7  . Smokeless tobacco: Never Used  . Tobacco comment: smoked 1963 -2009; total 5 packs of cigarettes in life . Cigars < 1 / day on average 1992-2009  Substance and Sexual Activity  . Alcohol use: Yes    Alcohol/week: 7.0 standard drinks    Types: 7 Glasses of wine per week    Comment: Socially  . Drug use: No  . Sexual activity: Not Currently    Partners: Female

## 2019-05-28 NOTE — Progress Notes (Signed)
Cardiology Office Note:    Date:  05/29/2019   ID:  TYAN DY, DOB [birth year], MRN [id number]  PCP:  Ma Hillock, DO  Cardiologist:  Sherren Mocha, MD  / [name], PA-C  Electrophysiologist:  None   Referring MD: Ma Hillock, DO   Chief Complaint  Patient presents with  . Follow-up    A. fib    History of Present Illness:    Mike Day is a 73 y.o. male with:  AFlutter s/p RFCA  Permanent AFib ? CHADS2-VASc=3(DM,HTN, age x59).  Diabetes mellitus  Hypertension   Hyperlipidemia   Prostate CA s/p prostatectomy  Echocardiogram 03/2019: EF 60-65  Event monitor 03/2019: AFib, Avg HR 69, PVCs, wide complex rhythm (NSVT vs aberrancy)  Carotid Dz ? [place] 03/2019: Bilat ICA 1-39  Mike Day had low blood pressure associated with a Herpes Zoster infection in June 2020.  His hypertensive regimen was adjusted.  He had to resume Amlodipine due to uncontrolled blood pressure.    He returns for follow-up.  He is here alone.  Since last seen, he has done well.  He has not had chest pain, shortness of breath, syncope, lower extremity swelling.  He has not had any bleeding issues.  Prior CV studies:   The following studies were reviewed today:  Echocardiogram 04/02/2019 EF 60-65, severe asymmetric LVH (no LVOT gradient), normal RVSF, mild RVE, RVSP 24.6, mod BAE, mod MAC, ascending aortal mildly dilated (41 mm)  LT Monitor (3-14 days) 03/13/2019 The basic rhythm is atrial fibrillation with well-controlled ventricular rate, average heart rate 69 bpm There are no pathologic pauses greater than 3 seconds There are isolated PVCs and wide-complex runs that could represent nonsustained VT versus aberrancy, longest of which is 32 beats  Carotid US 03/19/2019 Summary: Right Carotid: Velocities in the right ICA are consistent with a 1-39% stenosis.                Non-hemodynamically significant plaque <50% noted in the CCA.  Left Carotid: Velocities in the left ICA are  consistent with a 1-39% stenosis.               Non-hemodynamically significant plaque noted in the CCA.  Vertebrals:  Bilateral vertebral arteries demonstrate antegrade flow. Subclavians: Normal flow hemodynamics were seen in bilateral subclavian              arteries.   ETT 10/16  Blood pressure demonstrated a hypertensive response to exercise.  There was no ST segment deviation noted during stress.  Echo 09/02/13  Severe basal septal hypertrophy, moderate LVH, EF 60-65%, moderate LAE, mild RAE   Past Medical History:  Diagnosis Date  . Anemia    Early 20's  . Atrial fibrillation ([place]) 10/07/2014   3-14 day cardiac monitor 03/2019:  AFib, Avg HR 69, no significant pauses, PVCs, wide complex runs (aberrancy vs NSVT - longest 32 beats).  . Atrial flutter [facility]) 2008   Status post RFCA Dr Cristopher Peru 2008  . Chronic atrial fibrillation    Eliquis  . Diabetes mellitus without complication ([place])    Dx by A1c criteria 2014  . Echocardiograms    Echo 12/14 - Severe basal septal hypertrophy, moderate LVH, EF 60-65%, moderate LAE, mild RAE // Echo 7/20: EF 60-65, severe asymmetric LVH, mild RVE, normal RV SF, moderate BAE, moderate MAC, moderate aortic valve calcification (noncoronary cusp), mild dilation of ascending aorta (41 mm)  . Hx of adenomatous colonic polyps   . Hyperlipidemia   .  Hypertension   . Malignant neoplasm of prostate ([place]) 05/18/2012  . Prostate cancer [facility]) 2006   Prostatectomy  . Shingles 01/2019   left side of face/eye/neck/ear    Surgical Hx: The patient  has a past surgical history that includes Radiofrequency ablation (01/15/2007); Mohs surgery; Colonoscopy w/ polypectomy (2003;2010;04/2014); Septoplasty (Age 89); Hernia repair (07/2008); Prostatectomy (2006); and transthoracic echocardiogram (09/02/2013).   Current Medications: Current Meds  Medication Sig  . amLODipine (NORVASC) 2.5 MG tablet Take 1 tablet (2.5 mg total) by mouth 2 (two) times a day.   [name] ELIQUIS 5 MG TABS tablet TAKE 1 TABLET TWICE A DAY  . gabapentin (NEURONTIN) 100 MG capsule 1 tab before bed for 3 days, then increase to one tab BID for 3 days, then 1 tab TID  . lisinopril (ZESTRIL) 20 MG tablet Take 1 tablet (20 mg total) by mouth daily.  . simvastatin (ZOCOR) 20 MG tablet Take 1 tablet (20 mg total) by mouth at bedtime.     Allergies:   Levofloxacin, Neosporin [neomycin-bacitracin zn-polymyx], and Tape   Social History   Tobacco Use  . Smoking status: Former Smoker    Quit date: 09/06/2007    Years since quitting: 11.7  . Smokeless tobacco: Never Used  . Tobacco comment: smoked 1963 -2009; total 5 packs of cigarettes in life . Cigars < 1 / day on average 1992-2009  Substance Use Topics  . Alcohol use: Yes    Alcohol/week: 7.0 standard drinks    Types: 7 Glasses of wine per week    Comment: Socially  . Drug use: No     Family Hx: The patient's family history includes COPD in his father; Diabetes in his mother; Emphysema in his father; Heart attack (age of onset: 36) in his brother; Heart disease in his brother and father; Hyperlipidemia in his brother; Hypertension in his brother; Lung disease in his father; Stroke (age of onset: 14) in his mother. There is no history of Cancer.  ROS:   Please see the history of present illness.    ROS All other systems reviewed and are negative.   EKGs/Labs/Other Test Reviewed:    EKG:  EKG is   ordered today.  The ekg ordered today demonstrates atrial fibrillation, HR 53, normal axis, QTC 427, no change from prior tracing  Recent Labs: 03/14/2019: ALT 15; Hemoglobin 15.0; Platelets 201.0; TSH 1.12 03/25/2019: BUN 13; Creatinine, Ser 0.94; Potassium 4.1; Sodium 133   Recent Lipid Panel Lab Results  Component Value Date/Time   CHOL 119 02/06/2018 09:28 AM   TRIG 55.0 02/06/2018 09:28 AM   HDL 43.30 02/06/2018 09:28 AM   CHOLHDL 3 02/06/2018 09:28 AM   LDLCALC 65 02/06/2018 09:28 AM    Physical Exam:    VS:  BP  122/72   Pulse (!) 53   Ht _0  (1.88 m)   Wt 204 lb (92.5 kg)   BMI 26.19 kg/m     Wt Readings from Last 3 Encounters:  05/29/19 204 lb (92.5 kg)  05/23/19 201 lb (91.2 kg)  04/25/19 201 lb (91.2 kg)     Physical Exam  Constitutional: He is oriented to person, place, and time. He appears well-developed and well-nourished. No distress.  HENT:  Head: Normocephalic and atraumatic.  Neck: Neck supple. No JVD present.  Cardiovascular: Normal rate, S1 normal and S2 normal. An irregularly irregular rhythm present.  No murmur heard. Pulmonary/Chest: Breath sounds normal. He has no rales.  Abdominal: Soft. There is no hepatomegaly.  Musculoskeletal:        General: No edema.  Neurological: He is alert and oriented to person, place, and time.  Skin: Skin is warm and dry.    ASSESSMENT & PLAN:    1. Permanent atrial fibrillation Heart rate well controlled.  Recent event monitor demonstrated average heart rate of 69.  He is tolerating anticoagulation.  Continue Apixaban 5 mg twice daily.  Most recent hemoglobin and creatinine are normal.  2. Essential hypertension The patient's blood pressure is controlled on his current regimen.  Continue current therapy.    Dispo:  Return in about 6 months (around 11/26/2019) for Routine Follow Up, w/ [name], PA-C.   Medication Adjustments/Labs and Tests Ordered: Current medicines are reviewed at length with the patient today.  Concerns regarding medicines are outlined above.  Tests Ordered: Orders Placed This Encounter  Procedures  . EKG 12-Lead   Medication Changes: No orders of the defined types were placed in this encounter.   Signed, [name], PA-C  05/29/2019 12:06 PM    [facility] [address], [place], [place]  [address] Phone: [phone]; Fax: [phone]

## 2019-05-29 ENCOUNTER — Encounter: Payer: Self-pay | Admitting: Physician Assistant

## 2019-05-29 ENCOUNTER — Ambulatory Visit (INDEPENDENT_AMBULATORY_CARE_PROVIDER_SITE_OTHER): Payer: Medicare Other | Admitting: Physician Assistant

## 2019-05-29 ENCOUNTER — Other Ambulatory Visit: Payer: Self-pay

## 2019-05-29 VITALS — BP 122/72 | HR 53 | Ht 74.0 in | Wt 204.0 lb

## 2019-05-29 DIAGNOSIS — I10 Essential (primary) hypertension: Secondary | ICD-10-CM | POA: Diagnosis not present

## 2019-05-29 DIAGNOSIS — I4821 Permanent atrial fibrillation: Secondary | ICD-10-CM | POA: Diagnosis not present

## 2019-05-29 MED ORDER — AMLODIPINE BESYLATE 2.5 MG PO TABS: 2.5000 mg | ORAL_TABLET | Freq: Two times a day (BID) | ORAL | 3 refills | Status: DC

## 2019-05-29 NOTE — Patient Instructions (Signed)
Medication Instructions:  none If you need a refill on your cardiac medications before your next appointment, please call your pharmacy.   Lab work: none If you have labs (blood work) drawn today and your tests are completely normal, you will receive your results only by: [name] MyChart Message (if you have MyChart) OR . A paper copy in the mail If you have any lab test that is abnormal or we need to change your treatment, we will call you to review the results.  Testing/Procedures: none  Follow-Up: 6 months with [facility], you and your health needs are our priority.  As part of our continuing mission to provide you with exceptional heart care, we have created designated Provider Care Teams.  These Care Teams include your primary Cardiologist (physician) and Advanced Practice Providers (APPs -  Physician Assistants and Nurse Practitioners) who all work together to provide you with the care you need, when you need it. .   Any Other Special Instructions Will Be Listed Below (If Applicable).

## 2019-05-30 ENCOUNTER — Other Ambulatory Visit: Payer: Self-pay | Admitting: Cardiovascular Disease

## 2019-06-07 ENCOUNTER — Encounter: Payer: Self-pay | Admitting: Gastroenterology

## 2019-06-07 ENCOUNTER — Ambulatory Visit (INDEPENDENT_AMBULATORY_CARE_PROVIDER_SITE_OTHER): Payer: Medicare Other | Admitting: Gastroenterology

## 2019-06-07 VITALS — BP 126/68 | HR 66 | Temp 98.1°F | Ht 75.0 in | Wt 204.1 lb

## 2019-06-07 DIAGNOSIS — Z860100 Personal history of colon polyps, unspecified: Secondary | ICD-10-CM | POA: Insufficient documentation

## 2019-06-07 DIAGNOSIS — Z7901 Long term (current) use of anticoagulants: Secondary | ICD-10-CM

## 2019-06-07 MED ORDER — NA SULFATE-K SULFATE-MG SULF 17.5-3.13-1.6 GM/177ML PO SOLN: 1.0000 | Freq: Once | ORAL | 0 refills | Status: AC

## 2019-06-07 NOTE — Progress Notes (Signed)
Reviewed and agree with management plan.  Destan Franchini T. Devynn Scheff, MD FACG [facility]  

## 2019-06-07 NOTE — Progress Notes (Signed)
06/07/2019 Mike Day [id number] [birth year]   HISTORY OF PRESENT ILLNESS:  This is a 73 year old male who is known to Dr. Fuller Plan for previous colonoscopy. Had adenomatous polyps removed on colonoscopy in 05/2009 and 04/2014. Also had diverticulosis. Repeat colonoscopy was recommended in 5 years from that time. He is here today to schedule colonoscopy. He is on Eliquis due to atrial fibrillation/flutter, which is controlled by [name], PA-C. No GI complaints.    Past Medical History:  Diagnosis Date  . Anemia    Early 20's  . Atrial fibrillation ([place]) 10/07/2014   3-14 day cardiac monitor 03/2019:  AFib, Avg HR 69, no significant pauses, PVCs, wide complex runs (aberrancy vs NSVT - longest 32 beats).  . Atrial flutter [facility]) 2008   Status post RFCA Dr Cristopher Peru 2008  . Chronic atrial fibrillation ([facility])    Eliquis  . Diabetes mellitus without complication ([place])    Dx by A1c criteria 2014  . Echocardiograms    Echo 12/14 - Severe basal septal hypertrophy, moderate LVH, EF 60-65%, moderate LAE, mild RAE // Echo 7/20: EF 60-65, severe asymmetric LVH, mild RVE, normal RV SF, moderate BAE, moderate MAC, moderate aortic valve calcification (noncoronary cusp), mild dilation of ascending aorta (41 mm)  . Hx of adenomatous colonic polyps   . Hyperlipidemia   . Hypertension   . Malignant neoplasm of prostate ([place]) 05/18/2012  . Prostate cancer [facility]) 2006   Prostatectomy  . Shingles 01/2019   left side of face/eye/neck/ear    Past Surgical History:  Procedure Laterality Date  . COLONOSCOPY W/ POLYPECTOMY  2003;2010;04/2014   Tubular adenoma: recall 5 yrs (after 04/2019)  . HERNIA REPAIR  07/2008   Dr.Martin  . MOHS SURGERY     left hand  . 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 RADIOFREQUENCY ABLATION  01/15/2007   for ectopic atrial foci  . SEPTOPLASTY  Age 76  . TRANSTHORACIC ECHOCARDIOGRAM  09/02/2013   Normal.  EF 60%.  LAE and RAE.    reports  that he quit smoking about 11 years ago. He has never used smokeless tobacco. He reports current alcohol use of about 7.0 standard drinks of alcohol per week. He reports that he does not use drugs. family history includes COPD in his father; Diabetes in his mother; Emphysema in his father; Heart attack (age of onset: 30) in his brother; Heart disease in his brother and father; Hyperlipidemia in his brother; Hypertension in his brother; Lung disease in his father; Stroke (age of onset: 7) in his mother. Allergies  Allergen Reactions  . Levofloxacin Rash  . Neosporin [Neomycin-Bacitracin Zn-Polymyx] Other (See Comments)    Dry/scaly skin  . Tape Other (See Comments)    ADHESIVE TAPE; BLISTERS,      Outpatient Encounter Medications as of 06/07/2019  Medication Sig  . amLODipine (NORVASC) 2.5 MG tablet Take 1 tablet (2.5 mg total) by mouth 2 (two) times daily.  [name] ELIQUIS 5 MG TABS tablet TAKE 1 TABLET TWICE A DAY  . lisinopril (ZESTRIL) 20 MG tablet Take 1 tablet (20 mg total) by mouth daily.  . simvastatin (ZOCOR) 20 MG tablet Take 1 tablet (20 mg total) by mouth at bedtime.  . [DISCONTINUED] gabapentin (NEURONTIN) 100 MG capsule 1 tab before bed for 3 days, then increase to one tab BID for 3 days, then 1 tab TID   No facility-administered encounter medications on file as of 06/07/2019.  REVIEW OF SYSTEMS  : All other systems reviewed and negative except where noted in the History of Present Illness.   PHYSICAL EXAM: BP 126/68   Pulse 66   Temp 98.1 F (36.7 C)   Ht _0  (1.905 m)   Wt 204 lb 2 oz (92.6 kg)   BMI 25.51 kg/m  General: Well developed white male in no acute distress Head: Normocephalic and atraumatic Eyes:  Sclerae anicteric, conjunctiva pink. Ears: Normal auditory acuity Lungs: Clear throughout to auscultation; no increased WOB. Heart: Irregularly irregular. Abdomen: Soft, non-distended.  BS present.  Non-tender. Rectal:  Will be done at the time of  colonoscopy. Musculoskeletal: Symmetrical with no gross deformities  Skin: No lesions on visible extremities Extremities: No edema  Neurological: Alert oriented x 4, grossly non-focal Psychological:  Alert and cooperative. Normal mood and affect  ASSESSMENT AND PLAN: *Personal history of adenomatous polyps: Last colonoscopy August 2015.  Will schedule for colonoscopy with Dr. Fuller Plan. *Chronic anticoagulation use with Eliquis for atrial fibrillation/flutter.  Will hold Eliquis for 2 days prior to endoscopic procedures - will instruct when and how to resume after procedure. Benefits and risks of procedure explained including risks of bleeding, perforation, infection, missed lesions, reactions to medications and possible need for hospitalization and surgery for complications. Additional rare but real risk of stroke or other vascular clotting events off of Eliquis also explained and need to seek urgent help if any signs of these problems occur. Will communicate by phone or EMR with patient's prescribing provider, [name], PA-C, to confirm that holding Eliquis is reasonable in this case.    CC:  Kuneff, Renee A, DO

## 2019-06-07 NOTE — Patient Instructions (Addendum)
If you are age 73 or older, your body mass index should be between 23-30. Your Body mass index is 25.51 kg/m. If this is out of the aforementioned range listed, please consider follow up with your Primary Care Provider.  If you are age 76 or younger, your body mass index should be between 19-25. Your Body mass index is 25.51 kg/m. If this is out of the aformentioned range listed, please consider follow up with your Primary Care Provider.   You have been scheduled for a colonoscopy. Please follow written instructions given to you at your visit today.  Please pick up your prep supplies at the pharmacy within the next 1-3 days. If you use inhalers (even only as needed), please bring them with you on the day of your procedure.  You will be contacted by our office prior to your procedure for directions on holding your ELIQUIS.  If you do not hear from our office 1 week prior to your scheduled procedure, please call [phone] to discuss.    It was a pleasure to see you today!  Alonza Bogus PA-C

## 2019-06-10 ENCOUNTER — Telehealth: Payer: Self-pay

## 2019-06-10 NOTE — Telephone Encounter (Signed)
Hi Toni Mr. Acy may hold his Eliquis for 2 days prior to his colonoscopy and resume as soon as possible post procedure (when felt to be safe from a bleeding perspective). Thank you! [name], PA-C    06/10/2019 5:16 PM

## 2019-06-10 NOTE — Telephone Encounter (Signed)
Leta Speller PA   This is Mike Day,CMA from GI. This patient is scheduled for a colonoscopy.  I asked for cardiac clearance as per protocol.  Patients wife was not satisfied with this way of clearance and is requesting your input. Please advise on holding Mike Day's Eliquis. We like to hold 2 days prior to the procedure.   Thank you for your time.   Vivien Rota, CMA

## 2019-06-10 NOTE — Telephone Encounter (Signed)
   Primary Cardiologist: Sherren Mocha, MD  Chart reviewed as part of pre-operative protocol coverage. Given past medical history and time since last visit, based on ACC/AHA guidelines, Mike Day would be at acceptable risk for the planned procedure without further cardiovascular testing.   Pt takes Eliquis for atrial fibrillation with CHADS2VASc score of 4 (age, HTN, DM, CAD). Renal function is normal. Ok to hold Eliquis for 1-2 days prior to procedure.  I will route this recommendation to the requesting party via Epic fax function and remove from pre-op pool.  Please call with questions.  Kathyrn Drown, NP 06/10/2019, 10:33 AM

## 2019-06-10 NOTE — Telephone Encounter (Signed)
[facility] Pre-operative Risk Assessment     Request for surgical clearance:     Endoscopy Procedure  What type of surgery is being performed?     Colonoscopy   When is this surgery scheduled?     07-04-2019  What type of clearance is required ?   Pharmacy  Are there any medications that need to be held prior to surgery and how long? Eliquis   Practice name and name of physician performing surgery?      [facility]  What is your office phone and fax number?      Phone- [phone]  [phone]  Anesthesia type (None, local, MAC, general) ?       MAC

## 2019-06-10 NOTE — Telephone Encounter (Signed)
Pt takes Eliquis for afib with CHADS2VASc score of 4 (age, HTN, DM, CAD). Renal function is normal. Ok to hold Eliquis for 1-2 days prior to procedure.

## 2019-06-11 NOTE — Telephone Encounter (Signed)
Patient has been notified and aware. He states clear understanding. Copy of communication has been sent to patient on my chart.

## 2019-06-14 ENCOUNTER — Encounter: Payer: Self-pay | Admitting: Family Medicine

## 2019-06-14 NOTE — Telephone Encounter (Signed)
Hey before I call the patient do you know about this? He message sounds familiar to me and wanted to check with you

## 2019-07-04 ENCOUNTER — Encounter: Payer: Medicare Other | Admitting: Gastroenterology

## 2019-07-26 ENCOUNTER — Other Ambulatory Visit: Payer: Self-pay | Admitting: Cardiovascular Disease

## 2019-07-26 DIAGNOSIS — I10 Essential (primary) hypertension: Secondary | ICD-10-CM

## 2019-07-26 MED ORDER — LISINOPRIL 20 MG PO TABS: 20.0000 mg | ORAL_TABLET | Freq: Every day | ORAL | 2 refills | Status: DC

## 2019-07-26 MED ORDER — AMLODIPINE BESYLATE 2.5 MG PO TABS: 2.5000 mg | ORAL_TABLET | Freq: Two times a day (BID) | ORAL | 2 refills | Status: DC

## 2019-07-27 IMAGING — MR MR HEAD W/O CM
8 of 10 series · 39 of 48 positions shown · non-contrast
Comparison: None.

CLINICAL DATA: Initial evaluation for acute dizziness, ataxia.

EXAM:
MRI HEAD WITHOUT CONTRAST
TECHNIQUE: Multiplanar, multiecho pulse sequences of the brain and surrounding
structures were obtained without intravenous contrast.

[Series 3: DWI · axial · 3.0mm · 1.09mm/px · z∈[-18,+143]mm · 11 of 110 slices shown (1 of 4)]
[im 1/110]
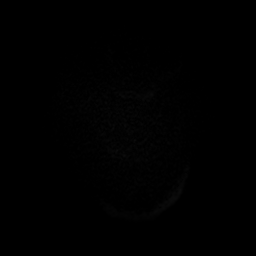
[im 11/110]
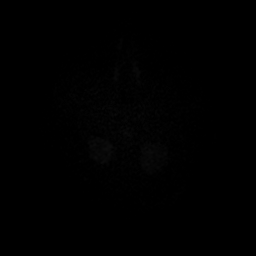
[im 22/110]
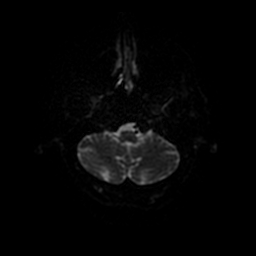
[im 33/110]
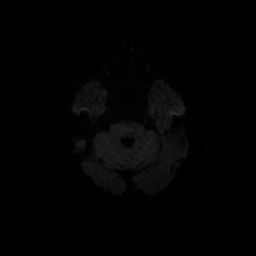
[im 44/110]
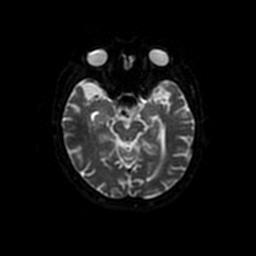
[im 55/110]
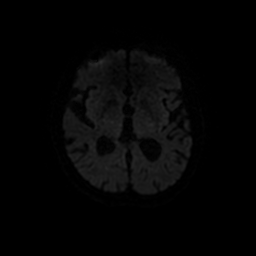
[im 66/110]
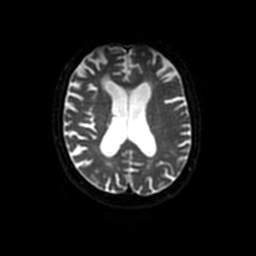
[im 77/110]
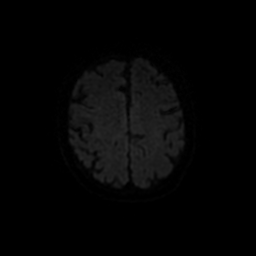
[im 88/110]
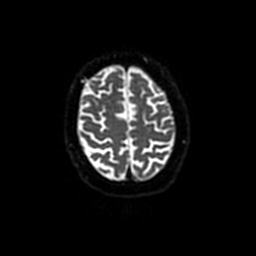
[im 99/110]
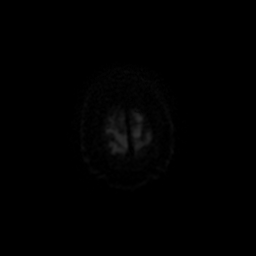
[im 110/110]
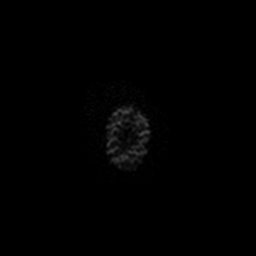

[Series 4: T1 · sagittal · 5.0mm · 0.47mm/px · 2 of 24 slices shown]
[im 1/24]
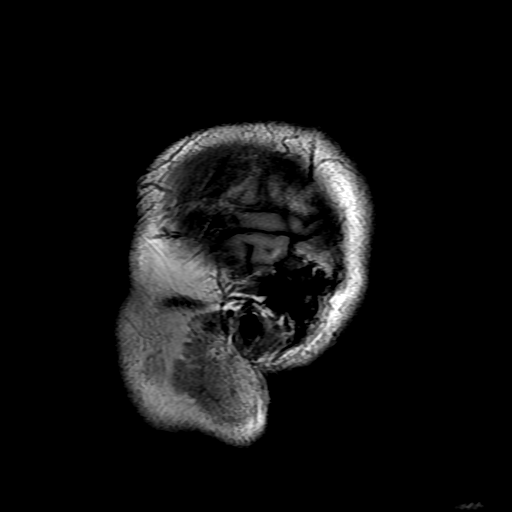
[im 24/24]
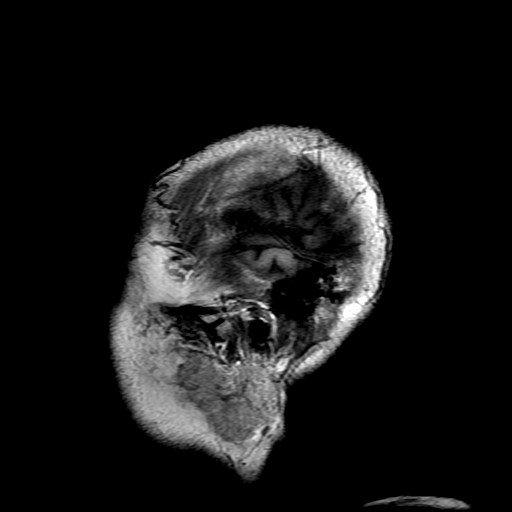

[Series 5: DWI · coronal · 4.0mm · 1.09mm/px · 7 of 68 slices shown (2 of 4)]
[im 1/68]
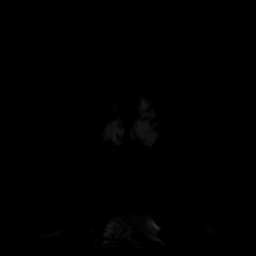
[im 12/68]
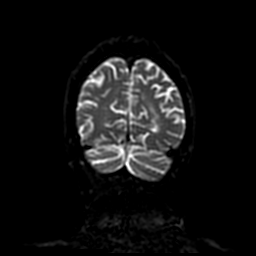
[im 23/68]
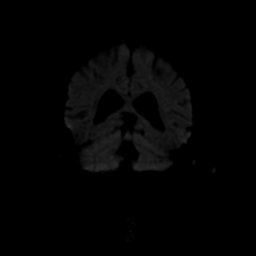
[im 34/68]
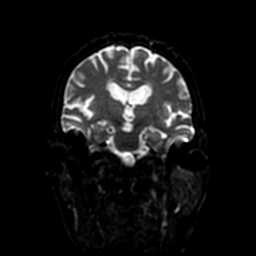
[im 45/68]
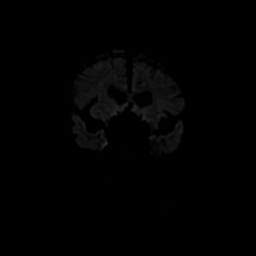
[im 56/68]
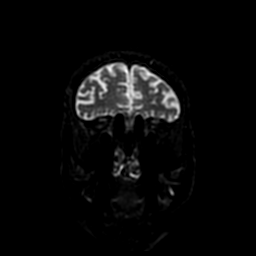
[im 68/68]
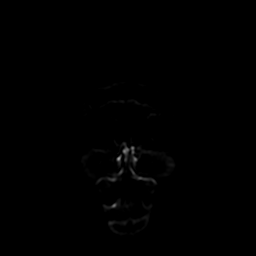

[Series 6: T2 · axial · 5.0mm · 0.43mm/px · z∈[-13,+144]mm · 3 of 24 slices shown (1 of 2)]
[im 1/24]
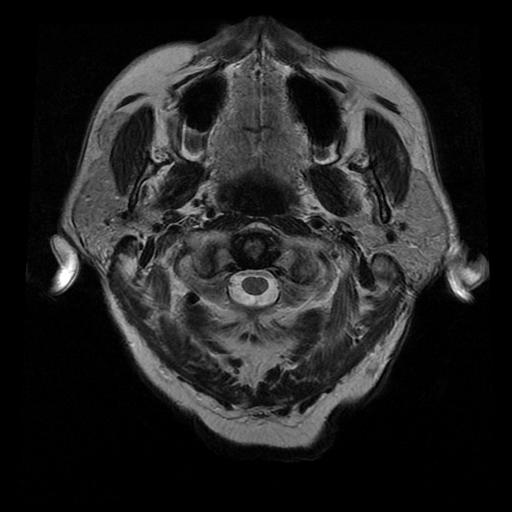
[im 12/24]
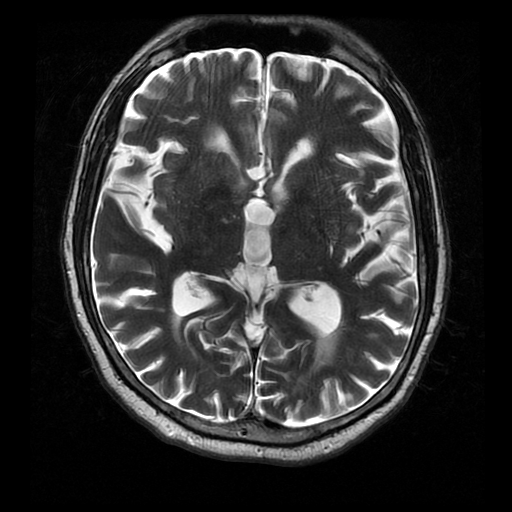
[im 24/24]
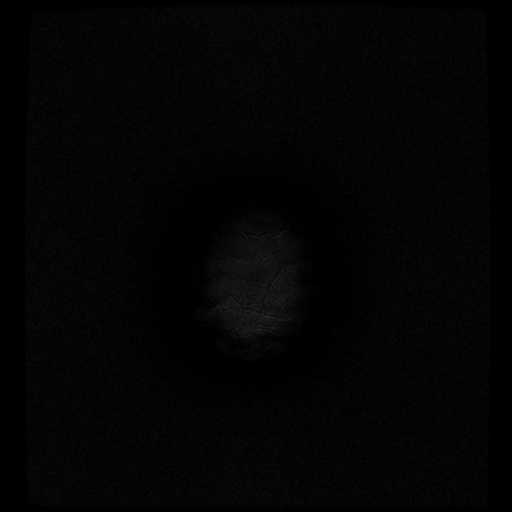

[Series 7: FLAIR · axial · 3.0mm · 0.43mm/px · z∈[-11,+142]mm · 3 of 27 slices shown]
[im 1/27]
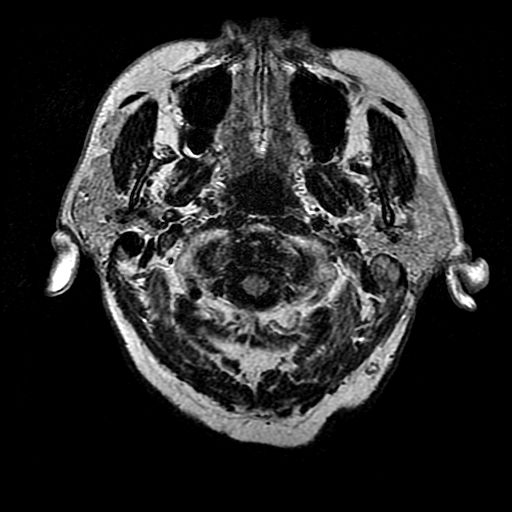
[im 14/27]
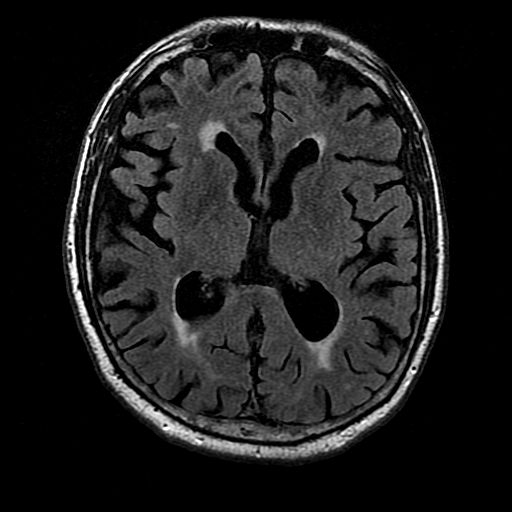
[im 27/27]
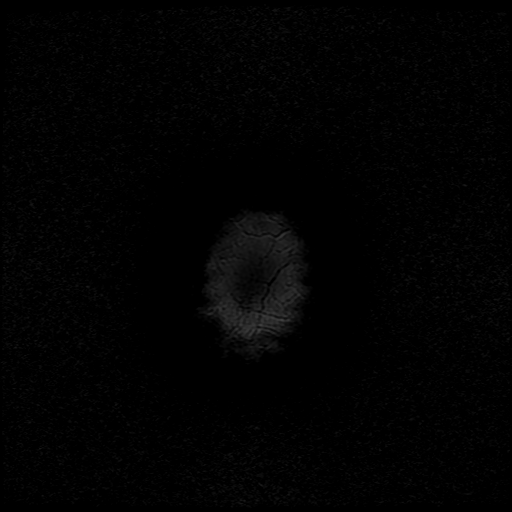

[Series 10: T2 · coronal · 5.0mm · 0.45mm/px · 3 of 26 slices shown (2 of 2)]
[im 1/26]
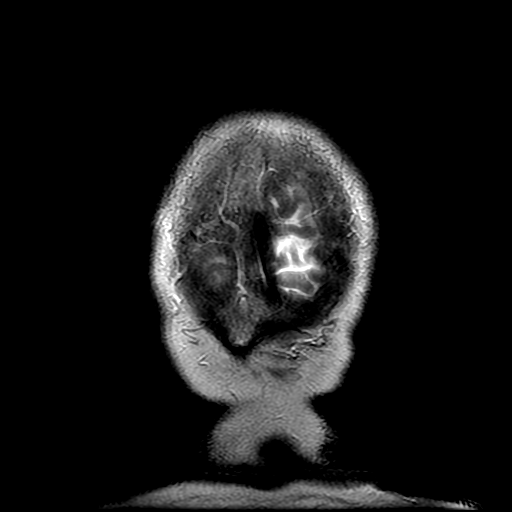
[im 13/26]
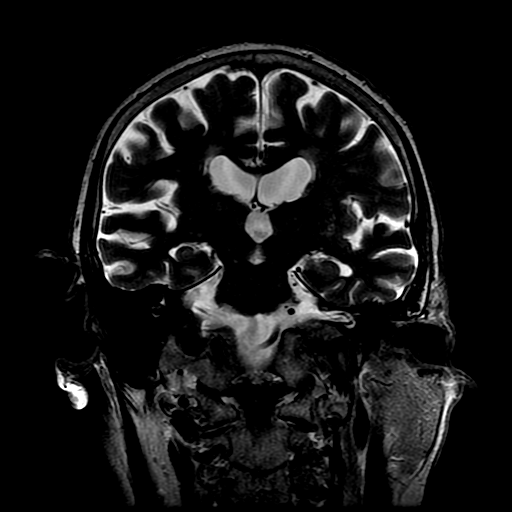
[im 26/26]
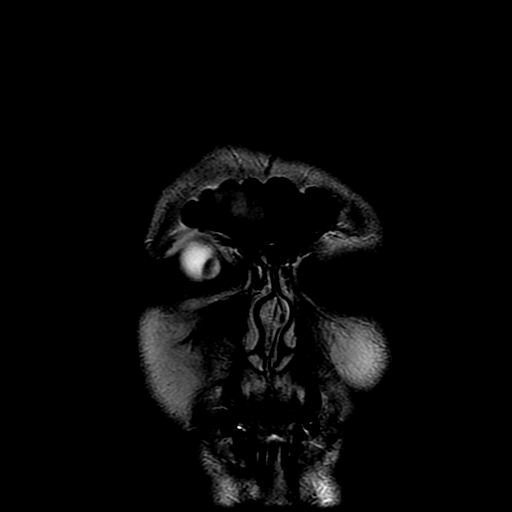

[Series 300: DWI · axial · 3.0mm · 1.09mm/px · z∈[-18,+143]mm · 6 of 55 slices shown (3 of 4)]
[im 1/55]
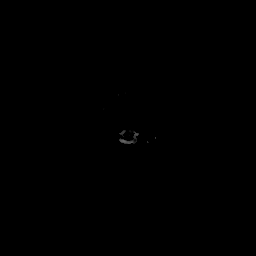
[im 11/55]
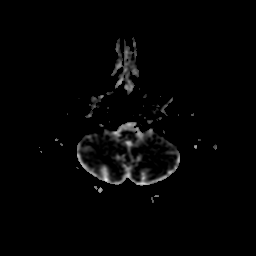
[im 22/55]
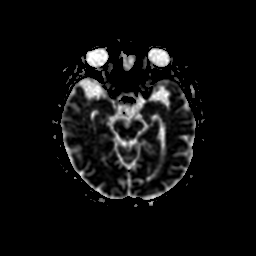
[im 33/55]
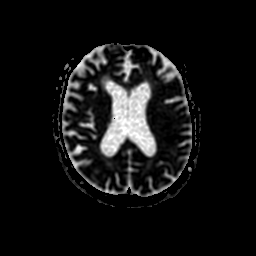
[im 44/55]
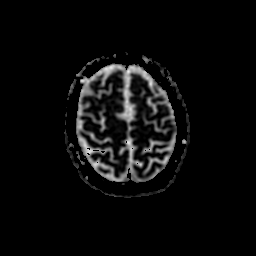
[im 55/55]
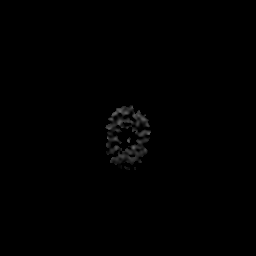

[Series 500: DWI · coronal · 4.0mm · 1.09mm/px · 4 of 34 slices shown (4 of 4)]
[im 1/34]
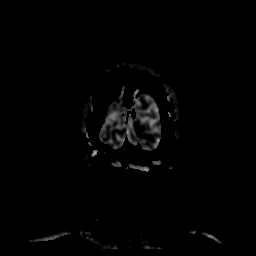
[im 12/34]
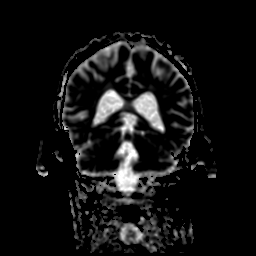
[im 23/34]
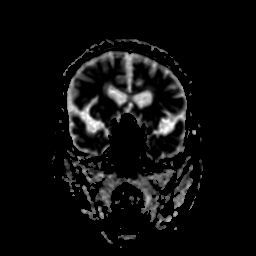
[im 34/34]
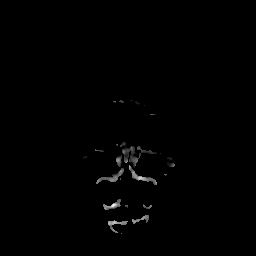

[39 of 48 positions shown; findings below may reference images not displayed]

FINDINGS: Brain: Moderate cerebral atrophy with mild to moderate chronic small
vessel ischemic disease. No acute intracranial infarct. No areas of
chronic cortical infarction. No acute or chronic intracranial
hemorrhage.

No mass lesion, midline shift or mass effect. No hydrocephalus. No
extra-axial fluid collection. 2 Crook gland normal.

Vascular: Major intracranial vascular flow voids maintained

Skull and upper cervical spine: Craniocervical junction normal. No
focal marrow replacing lesion. Scalp soft tissues unremarkable.

Sinuses/Orbits: Globes and orbital soft tissues within normal
limits. Paranasal sinuses are clear. Small bilateral mastoid
effusions, of doubtful significance.

Other: None.
IMPRESSION: 1. No acute intracranial abnormality.
2. Generalized cerebral atrophy with mild to moderate chronic small
vessel ischemic disease.

## 2019-08-14 ENCOUNTER — Other Ambulatory Visit: Payer: Self-pay | Admitting: Cardiovascular Disease

## 2019-08-14 DIAGNOSIS — I10 Essential (primary) hypertension: Secondary | ICD-10-CM

## 2019-08-15 ENCOUNTER — Other Ambulatory Visit: Payer: Self-pay

## 2019-08-15 ENCOUNTER — Emergency Department (HOSPITAL_BASED_OUTPATIENT_CLINIC_OR_DEPARTMENT_OTHER): Payer: Medicare Other

## 2019-08-15 ENCOUNTER — Encounter (HOSPITAL_BASED_OUTPATIENT_CLINIC_OR_DEPARTMENT_OTHER): Payer: Self-pay

## 2019-08-15 ENCOUNTER — Telehealth: Payer: Self-pay | Admitting: Family Medicine

## 2019-08-15 ENCOUNTER — Emergency Department (HOSPITAL_BASED_OUTPATIENT_CLINIC_OR_DEPARTMENT_OTHER)
Admission: EM | Admit: 2019-08-15 | Discharge: 2019-08-15 | Disposition: A | Discharge status: Home / Self Care | Payer: Medicare Other | Attending: Emergency Medicine | Admitting: Emergency Medicine

## 2019-08-15 DIAGNOSIS — Z87891 Personal history of nicotine dependence: Secondary | ICD-10-CM | POA: Diagnosis not present

## 2019-08-15 DIAGNOSIS — E119 Type 2 diabetes mellitus without complications: Secondary | ICD-10-CM | POA: Insufficient documentation

## 2019-08-15 DIAGNOSIS — Z883 Allergy status to other anti-infective agents status: Secondary | ICD-10-CM | POA: Insufficient documentation

## 2019-08-15 DIAGNOSIS — W19XXXA Unspecified fall, initial encounter: Secondary | ICD-10-CM

## 2019-08-15 DIAGNOSIS — M25512 Pain in left shoulder: Secondary | ICD-10-CM | POA: Diagnosis not present

## 2019-08-15 DIAGNOSIS — R1110 Vomiting, unspecified: Secondary | ICD-10-CM | POA: Insufficient documentation

## 2019-08-15 DIAGNOSIS — R519 Headache, unspecified: Secondary | ICD-10-CM | POA: Insufficient documentation

## 2019-08-15 DIAGNOSIS — I10 Essential (primary) hypertension: Secondary | ICD-10-CM | POA: Diagnosis not present

## 2019-08-15 DIAGNOSIS — Z881 Allergy status to other antibiotic agents status: Secondary | ICD-10-CM | POA: Diagnosis not present

## 2019-08-15 DIAGNOSIS — Z79899 Other long term (current) drug therapy: Secondary | ICD-10-CM | POA: Diagnosis not present

## 2019-08-15 DIAGNOSIS — Z8546 Personal history of malignant neoplasm of prostate: Secondary | ICD-10-CM | POA: Insufficient documentation

## 2019-08-15 DIAGNOSIS — I4891 Unspecified atrial fibrillation: Secondary | ICD-10-CM | POA: Diagnosis not present

## 2019-08-15 DIAGNOSIS — E785 Hyperlipidemia, unspecified: Secondary | ICD-10-CM | POA: Diagnosis not present

## 2019-08-15 DIAGNOSIS — Z7901 Long term (current) use of anticoagulants: Secondary | ICD-10-CM | POA: Insufficient documentation

## 2019-08-15 DIAGNOSIS — G4751 Confusional arousals: Secondary | ICD-10-CM | POA: Diagnosis not present

## 2019-08-15 DIAGNOSIS — S0990XA Unspecified injury of head, initial encounter: Secondary | ICD-10-CM

## 2019-08-15 MED ORDER — ACETAMINOPHEN 500 MG PO TABS
1000.0000 mg | ORAL_TABLET | Freq: Once | ORAL | Status: AC
  Administered 2019-08-15: 1000 mg via ORAL
  Filled 2019-08-15: qty 2

## 2019-08-15 NOTE — ED Provider Notes (Signed)
Emergency Department Provider Note   I have reviewed the triage vital signs and the nursing notes.   HISTORY  Chief Complaint No chief complaint on file.   HPI Mike Day is a 73 y.o. male presents to the emergency department for evaluation after mechanical fall.  Patient was working outside when he slipped on some wet leaves falling forward and striking his face as well as landing on his left arm.  He is having pain in the left shoulder but also notes some facial pain and mild headache.  He has not had confusion or vomiting.  Denies any neck pain.  No weakness or numbness in the arms or legs.  No uncontrolled bleeding.  He has been ambulatory since the fall and feels unsteady on his feet.  No radiation of symptoms or other modifying factors. He is anticoagulated due to A-fib.    Past Medical History:  Diagnosis Date   Anemia    Early 20's   Atrial fibrillation ([place]) 10/07/2014   3-14 day cardiac monitor 03/2019:  AFib, Avg HR 69, no significant pauses, PVCs, wide complex runs (aberrancy vs NSVT - longest 32 beats).   Atrial flutter ([place]) 2008   Status post RFCA Dr Cristopher Peru 2008   Chronic atrial fibrillation ([place])    Eliquis   Diabetes mellitus without complication ([place])    Dx by A1c criteria 2014   Echocardiograms    Echo 12/14 - Severe basal septal hypertrophy, moderate LVH, EF 60-65%, moderate LAE, mild RAE // Echo 7/20: EF 60-65, severe asymmetric LVH, mild RVE, normal RV SF, moderate BAE, moderate MAC, moderate aortic valve calcification (noncoronary cusp), mild dilation of ascending aorta (41 mm)   Hx of adenomatous colonic polyps    Hyperlipidemia    Hypertension    Malignant neoplasm of prostate ([employer]) 05/18/2012   Prostate cancer ([place]) 2006   Prostatectomy   Shingles 01/2019   left side of face/eye/neck/ear     Patient Active Problem List   Diagnosis Date Noted   Hx of colonic polyps 06/07/2019   Post herpetic neuralgia 03/15/2019    Hyperkalemia 03/15/2019   Cerebral atrophy ([place]) 03/15/2019   Cerebral arterial disease 03/15/2019   Atrial fibrillation ([place]) 10/07/2014   Dermatochalasis of both upper eyelids 07/01/2014   Chronic anticoagulation 04/14/2014   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   Diabetes mellitus without complication ([place]) 94/49/6759   SKIN CANCER, HX OF 03/11/2009   Essential hypertension 09/17/2008   Hyperlipidemia 11/01/2007   PROSTATE CANCER, HX OF 01/18/2007   Tubular adenoma of colon 01/12/2007    Past Surgical History:  Procedure Laterality Date   COLONOSCOPY W/ POLYPECTOMY  2003;2010;04/2014   Tubular adenoma: recall 5 yrs (after 04/2019)   HERNIA REPAIR  07/2008   Dr.Martin   MOHS SURGERY     left hand   PROSTATECTOMY  2006   Robotic for adenocarcinoma; Dr Lawerance Bach   RADIOFREQUENCY ABLATION  01/15/2007   for ectopic atrial foci   SEPTOPLASTY  Age 67   TRANSTHORACIC ECHOCARDIOGRAM  09/02/2013   Normal.  EF 60%.  LAE and RAE.    Allergies Levofloxacin, Neosporin [neomycin-bacitracin zn-polymyx], and Tape  Family History  Problem Relation Age of Onset   Lung disease Father        Black Lung   Heart disease Father        Congenital   Emphysema Father    COPD Father    Diabetes Mother    Stroke Mother 49  Hypertension Brother    Heart disease Brother    Hyperlipidemia Brother    Heart attack Brother 55       sudden death   Cancer Neg Hx    Colon cancer Neg Hx    Esophageal cancer Neg Hx     Social History Social History   Tobacco Use   Smoking status: Former Smoker    Quit date: 09/06/2007    Years since quitting: 11.9   Smokeless tobacco: Never Used   Tobacco comment: smoked 1963 -2009; total 5 packs of cigarettes in life . Cigars < 1 / day on average 1992-2009  Substance Use Topics   Alcohol use: Yes    Alcohol/week: 7.0 standard drinks    Types: 7 Glasses of wine per week    Comment: Socially   Drug  use: No    Review of Systems  Constitutional: No fever/chills Eyes: No visual changes. ENT: No sore throat. Mild face pain.  Cardiovascular: Denies chest pain. Respiratory: Denies shortness of breath. Gastrointestinal: No abdominal pain.  No nausea, no vomiting.  No diarrhea.  No constipation. Genitourinary: Negative for dysuria. Musculoskeletal: Negative for back pain. Positive left shoulder pain.  Skin: Negative for rash. Neurological: Negative for focal weakness or numbness. Positive mild HA and face pain.   10-point ROS otherwise negative.  ____________________________________________   PHYSICAL EXAM:  VITAL SIGNS: ED Triage Vitals  Enc Vitals Group     BP 08/15/19 1810 (!) 163/117     Pulse Rate 08/15/19 1810 83     Resp 08/15/19 1810 17     Temp 08/15/19 1810 98.2 F (36.8 C)     Temp Source 08/15/19 1810 Oral     SpO2 08/15/19 1810 100 %     Weight 08/15/19 1811 205 lb (93 kg)     Height 08/15/19 1811 _0  (1.905 m)    Constitutional: Alert and oriented. Well appearing and in no acute distress. Eyes: Conjunctivae are normal.  Head: Atraumatic. Abrasion to the left face without hematoma.  Nose: No congestion/rhinnorhea. Mouth/Throat: Mucous membranes are moist.  Neck: No stridor. No cervical spine tenderness to palpation. Cardiovascular: Normal rate, regular rhythm. Good peripheral circulation. Grossly normal heart sounds.   Respiratory: Normal respiratory effort.  No retractions. Lungs CTAB. Gastrointestinal:  No distention.  Musculoskeletal: Pain with range of motion of the left shoulder.  Normal range of motion of the left elbow and wrist.  Mild anterior tenderness to palpation.  No deformity.  No gross deformities of extremities. Neurologic:  Normal speech and language. No gross focal neurologic deficits are appreciated.  Skin:  Skin is warm, dry and intact. No rash noted.  ____________________________________________  RADIOLOGY  CT Head Wo  Contrast  Result Date: 08/15/2019 CLINICAL DATA:  Posttraumatic headache EXAM: CT HEAD WITHOUT CONTRAST TECHNIQUE: Contiguous axial images were obtained from the base of the skull through the vertex without intravenous contrast. COMPARISON:  None. FINDINGS: Brain: There is no mass, hemorrhage or extra-axial collection. Generalized volume loss. There is hypoattenuation of the white matter, most commonly indicating chronic small vessel disease. 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 Vascular: Atherosclerotic calcification of the vertebral and internal carotid arteries at the skull base. No abnormal hyperdensity of the major intracranial arteries or dural venous sinuses. Skull: The visualized skull base, calvarium and extracranial soft tissues are normal. Sinuses/Orbits: No fluid levels or advanced mucosal thickening of the visualized paranasal sinuses. No mastoid or middle ear effusion. The orbits are normal. IMPRESSION: Generalized volume loss and  chronic small vessel disease without acute intracranial abnormality. Electronically Signed   By: Ulyses Jarred M.D.   On: 08/15/2019 20:22   DG Shoulder Left  Result Date: 08/15/2019 CLINICAL DATA:  [place] again streaky with anterior left shoulder pain, concern for dislocation and relocation. EXAM: LEFT SHOULDER - 2+ VIEW COMPARISON:  None. FINDINGS: The osseous structures appear diffusely demineralized which may limit detection of small or nondisplaced fractures. There is no evidence of fracture or dislocation. Humeral head is normally located at this time. No convincing Hill-Sachs or [place] deformities. Mild overlying soft tissue swelling is present. Minimal glenohumeral arthrosis. Included portions of lungs and chest wall are unremarkable aside from aortic atherosclerosis. IMPRESSION: 1. No acute fracture or dislocation of the left shoulder. 2. Diffuse osseous demineralization which may limit detection of small or nondisplaced fractures. 3. Mild overlying soft  tissue swelling. 4.  Aortic Atherosclerosis (ICD10-I70.0). Electronically Signed   By: Lovena Le M.D.   On: 08/15/2019 18:48    ____________________________________________   PROCEDURES  Procedure(s) performed:   Procedures  None ____________________________________________   INITIAL IMPRESSION / ASSESSMENT AND PLAN / ED COURSE  Pertinent labs & imaging results that were available during my care of the patient were reviewed by me and considered in my medical decision making (see chart for details).   Patient presents to the emergency department for evaluation after mechanical fall.  Plain film of the left shoulder ordered from triage which shows diffuse demineralization which may limit study but no obvious fracture or dislocation.  I do not feel that this is likely after my clinical exam.  I added on a CT head given the patient's anticoagulation status, abrasion to the face, history of fall with head injury.  No loss of consciousness or vomiting.  Neuro intact.  Plan for sports medicine follow-up, range of motion exercises, over-the-counter pain medications.  Discussed ED return precautions in detail.  Patient pleased at discharge.   ____________________________________________  FINAL CLINICAL IMPRESSION(S) / ED DIAGNOSES  Final diagnoses:  Injury of head, initial encounter  Acute pain of left shoulder  Fall, initial encounter     MEDICATIONS GIVEN DURING THIS VISIT:  Medications  acetaminophen (TYLENOL) tablet 1,000 mg (1,000 mg Oral Given 08/15/19 1959)     Note:  This document was prepared using Dragon voice recognition software and may include unintentional dictation errors.  Nanda Quinton, MD, [facility] Emergency Medicine    Limuel Nieblas, [facility], MD 08/16/19 [phone]

## 2019-08-15 NOTE — Telephone Encounter (Signed)
FYI  Patient wife called office. Reported patient was working outside and fell. He has possibly broke his collar bone and his nose. She is taking patient to [facility] ED.  No call back requested at this time. Wife wanted to let Dr. Raoul Pitch know.  Thank you!

## 2019-08-15 NOTE — Discharge Instructions (Signed)
You were seen in the emergency department today after fall with head injury and shoulder pain.  Please follow-up with the sports medicine doctor listed.  Perform the range of motion exercises outlined in the paperwork below.  You may take Tylenol as needed for pain.  Return to the emergency department with any worsening headache, vomiting, confusion, or other severe symptoms.

## 2019-08-15 NOTE — ED Notes (Signed)
Pt stated hitting his face on tree when slipping, afib pt on eliquis, skin abrasion but no tear. MD notified

## 2019-08-15 NOTE — ED Triage Notes (Signed)
Working in [place] today and slipped, left shoulder may have been dislocated but back in place now, denies any other injuries to arm. Good cap refill

## 2019-08-19 ENCOUNTER — Ambulatory Visit: Payer: Self-pay

## 2019-08-19 ENCOUNTER — Encounter: Payer: Self-pay | Admitting: Family Medicine

## 2019-08-19 ENCOUNTER — Ambulatory Visit (INDEPENDENT_AMBULATORY_CARE_PROVIDER_SITE_OTHER): Payer: Medicare Other | Admitting: Family Medicine

## 2019-08-19 ENCOUNTER — Other Ambulatory Visit: Payer: Self-pay

## 2019-08-19 VITALS — Ht 75.0 in | Wt 205.0 lb

## 2019-08-19 DIAGNOSIS — S43005A Unspecified dislocation of left shoulder joint, initial encounter: Secondary | ICD-10-CM | POA: Diagnosis not present

## 2019-08-19 DIAGNOSIS — S43005D Unspecified dislocation of left shoulder joint, subsequent encounter: Secondary | ICD-10-CM | POA: Insufficient documentation

## 2019-08-19 NOTE — Progress Notes (Signed)
Mike Day - 73 y.o. male MRN [id number]  Date of birth: 1946-04-23  SUBJECTIVE:  Including CC & ROS.  Chief Complaint  Patient presents with  . Shoulder Injury    left shoulder x 08/15/2019    Mike Day is a 73 y.o. male that is presenting with left shoulder pain.  He is having his shoulder pain since he fell forward on 12/10.  He was out in the woods cutting trees down and had an accident.  He fell forward and had this noticeable change in the shoulder joint.  He was evaluated in the emergency room and was not found to have a dislocation at that time.  His pain is been intermittent in nature.  It is worse if he lies on the affected side.  Can be mild to severe.  Has change in his range of motion.  Also having some swelling of the upper extremity.  He is right-handed.  Has been taking Tylenol for the pain.  Independent review of the left shoulder x-ray from 12/10 shows no acute abnormality.   Review of Systems  Constitutional: Negative for fever.  HENT: Negative for congestion.   Respiratory: Negative for cough.   Cardiovascular: Negative for chest pain.  Gastrointestinal: Negative for abdominal pain.  Musculoskeletal: Positive for arthralgias and joint swelling.  Skin: Negative for color change.  Neurological: Negative for weakness.  Hematological: Negative for adenopathy.    HISTORY: Past Medical, Surgical, Social, and Family History Reviewed & Updated per EMR.   Pertinent Historical Findings include:  Past Medical History:  Diagnosis Date  . Anemia    Early 20's  . Atrial fibrillation ([place]) 10/07/2014   3-14 day cardiac monitor 03/2019:  AFib, Avg HR 69, no significant pauses, PVCs, wide complex runs (aberrancy vs NSVT - longest 32 beats).  . Atrial flutter [facility]) 2008   Status post RFCA Dr Cristopher Peru 2008  . Chronic atrial fibrillation (H[facility])    Eliquis  . Diabetes mellitus without complication ([place])    Dx by A1c criteria 2014  . Echocardiograms    Echo 12/14 -  Severe basal septal hypertrophy, moderate LVH, EF 60-65%, moderate LAE, mild RAE // Echo 7/20: EF 60-65, severe asymmetric LVH, mild RVE, normal RV SF, moderate BAE, moderate MAC, moderate aortic valve calcification (noncoronary cusp), mild dilation of ascending aorta (41 mm)  . Hx of adenomatous colonic polyps   . Hyperlipidemia   . Hypertension   . Malignant neoplasm of prostate ([place]) 05/18/2012  . Prostate cancer [facility]) 2006   Prostatectomy  . Shingles 01/2019   left side of face/eye/neck/ear     Past Surgical History:  Procedure Laterality Date  . COLONOSCOPY W/ POLYPECTOMY  2003;2010;04/2014   Tubular adenoma: recall 5 yrs (after 04/2019)  . HERNIA REPAIR  07/2008   Dr.Martin  . MOHS SURGERY     left hand  . 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 RADIOFREQUENCY ABLATION  01/15/2007   for ectopic atrial foci  . SEPTOPLASTY  Age 35  . TRANSTHORACIC ECHOCARDIOGRAM  09/02/2013   Normal.  EF 60%.  LAE and RAE.    Allergies  Allergen Reactions  . Levofloxacin Rash  . Neosporin [Neomycin-Bacitracin Zn-Polymyx] Other (See Comments)    Dry/scaly skin  . Tape Other (See Comments)    ADHESIVE TAPE; BLISTERS,    Family History  Problem Relation Age of Onset  . Lung disease Father        Black Lung  .  Heart disease Father        Congenital  . Emphysema Father   . COPD Father   . Diabetes Mother   . Stroke Mother 96  . Hypertension Brother   . Heart disease Brother   . Hyperlipidemia Brother   . Heart attack Brother 5       sudden death  . Cancer Neg Hx   . Colon cancer Neg Hx   . Esophageal cancer Neg Hx      Social History   Socioeconomic History  . Marital status: Married    Spouse name: Not on file  . Number of children: Not on file  . Years of education: Not on file  . Highest education level: Not on file  Occupational History  . Not on file  Tobacco Use  . Smoking status: Former Smoker    Quit date: 09/06/2007    Years since  quitting: 11.9  . Smokeless tobacco: Never Used  . Tobacco comment: smoked 1963 -2009; total 5 packs of cigarettes in life . Cigars < 1 / day on average 1992-2009  Substance and Sexual Activity  . Alcohol use: Yes    Alcohol/week: 7.0 standard drinks    Types: 7 Glasses of wine per week    Comment: Socially  . Drug use: No  . Sexual activity: Not Currently    Partners: Female  Other Topics Concern  . Not on file  Social History Narrative   Married, 1 son, 2 grand-daughters.   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   Alcohol: 1 glass red wine per night.   Was once a long distance runner.   Social Determinants of Health   Financial Resource Strain:   . Difficulty of Paying Living Expenses: Not on file  Food Insecurity:   . Worried About Charity fundraiser in the Last Year: Not on file  . Ran Out of Food in the Last Year: Not on file  Transportation Needs:   . Lack of Transportation (Medical): Not on file  . Lack of Transportation (Non-Medical): Not on file  Physical Activity:   . Days of Exercise per Week: Not on file  . Minutes of Exercise per Session: Not on file  Stress:   . Feeling of Stress : Not on file  Social Connections:   . Frequency of Communication with Friends and Family: Not on file  . Frequency of Social Gatherings with Friends and Family: Not on file  . Attends Religious Services: Not on file  . Active Member of Clubs or Organizations: Not on file  . Attends Archivist Meetings: Not on file  . Marital Status: Not on file  Intimate Partner Violence:   . Fear of Current or Ex-Partner: Not on file  . Emotionally Abused: Not on file  . Physically Abused: Not on file  . Sexually Abused: Not on file     PHYSICAL EXAM:  VS: Ht _0  (1.905 m)   Wt 205 lb (93 kg)   BMI 25.62 kg/m  Physical Exam Gen: NAD, alert, cooperative with exam, well-appearing ENT: normal lips, normal nasal mucosa,  Eye:  normal EOM, normal conjunctiva and lids CV:  no edema, +2 pedal pulses   Resp: no accessory muscle use, non-labored,  Skin: no rashes, no areas of induration  Neuro: normal tone, normal sensation to touch Psych:  normal insight, alert and oriented MSK:  Left shoulder: Limited external rotation. Limited  flexion and abduction. Normal strength resistance with internal and external rotation. Neurovascularly intact  Limited ultrasound: Left shoulder:  Moderate encircling effusion noted of the biceps tendon on short and long axis. Normal subscapularis. Supraspinatus with calcific changes that are likely chronic in nature.  No tear appreciated. Large effusion noted in the posterior glenohumeral joint  Summary: Large joint effusion  Ultrasound and interpretation by Clearance Coots, MD    ASSESSMENT & PLAN:   Shoulder dislocation, left, initial encounter From his description it sounds like he had a shoulder dislocation.  He has a significant effusion noted on ultrasound today. -Sling. -Counseled on home exercise therapy and supportive care. -Could consider joint injection or physical therapy.  Could consider reimaging

## 2019-08-19 NOTE — Patient Instructions (Signed)
Nice to meet you Please try ice  Please try tylenol  Please try the sling You can try the range of motion exercises   Please send me a message in MyChart with any questions or updates.  Please see me back in 3 weeks.   --Dr. Raeford Razor

## 2019-08-19 NOTE — Assessment & Plan Note (Signed)
From his description it sounds like he had a shoulder dislocation.  He has a significant effusion noted on ultrasound today. -Sling. -Counseled on home exercise therapy and supportive care. -Could consider joint injection or physical therapy.  Could consider reimaging

## 2019-09-06 DIAGNOSIS — S82092A Other fracture of left patella, initial encounter for closed fracture: Secondary | ICD-10-CM

## 2019-09-06 HISTORY — DX: Other fracture of left patella, initial encounter for closed fracture: S82.092A

## 2019-09-09 ENCOUNTER — Ambulatory Visit (INDEPENDENT_AMBULATORY_CARE_PROVIDER_SITE_OTHER): Payer: Medicare Other | Admitting: Family Medicine

## 2019-09-09 ENCOUNTER — Ambulatory Visit (HOSPITAL_BASED_OUTPATIENT_CLINIC_OR_DEPARTMENT_OTHER)
Admission: RE | Admit: 2019-09-09 | Discharge: 2019-09-09 | Disposition: A | Discharge status: Home / Self Care | Payer: Medicare Other | Source: Ambulatory Visit | Attending: Family Medicine | Admitting: Family Medicine

## 2019-09-09 ENCOUNTER — Encounter: Payer: Self-pay | Admitting: Family Medicine

## 2019-09-09 ENCOUNTER — Other Ambulatory Visit: Payer: Self-pay

## 2019-09-09 VITALS — Ht 75.0 in | Wt 205.0 lb

## 2019-09-09 DIAGNOSIS — S43005A Unspecified dislocation of left shoulder joint, initial encounter: Secondary | ICD-10-CM | POA: Diagnosis not present

## 2019-09-09 DIAGNOSIS — S4992XA Unspecified injury of left shoulder and upper arm, initial encounter: Secondary | ICD-10-CM | POA: Diagnosis not present

## 2019-09-09 DIAGNOSIS — M25512 Pain in left shoulder: Secondary | ICD-10-CM | POA: Diagnosis not present

## 2019-09-09 DIAGNOSIS — S43005D Unspecified dislocation of left shoulder joint, subsequent encounter: Secondary | ICD-10-CM

## 2019-09-09 NOTE — Progress Notes (Signed)
Mike Day - 74 y.o. male MRN [id number]  Date of birth: 1946-08-14  SUBJECTIVE:  Including CC & ROS.  Chief Complaint  Patient presents with  . Follow-up    follow up for left shoulder    Mike Day is a 74 y.o. male that is following up for his left shoulder dislocation.  Has been wearing the sling and reports improvement of his pain.  He has not been actively using the shoulder.  He does have improvement of the range of motion.  Denies any numbness or tingling.    Review of Systems See HPI  HISTORY: Past Medical, Surgical, Social, and Family History Reviewed & Updated per EMR.   Pertinent Historical Findings include:  Past Medical History:  Diagnosis Date  . Anemia    Early 20's  . Atrial fibrillation ([place]) 10/07/2014   3-14 day cardiac monitor 03/2019:  AFib, Avg HR 69, no significant pauses, PVCs, wide complex runs (aberrancy vs NSVT - longest 32 beats).  . Atrial flutter [facility]) 2008   Status post RFCA Dr Cristopher Peru 2008  . Chronic atrial fibrillation ([facility])    Eliquis  . Diabetes mellitus without complication ([place])    Dx by A1c criteria 2014  . Echocardiograms    Echo 12/14 - Severe basal septal hypertrophy, moderate LVH, EF 60-65%, moderate LAE, mild RAE // Echo 7/20: EF 60-65, severe asymmetric LVH, mild RVE, normal RV SF, moderate BAE, moderate MAC, moderate aortic valve calcification (noncoronary cusp), mild dilation of ascending aorta (41 mm)  . Hx of adenomatous colonic polyps   . Hyperlipidemia   . Hypertension   . Malignant neoplasm of prostate ([place]) 05/18/2012  . Prostate cancer [facility]) 2006   Prostatectomy  . Shingles 01/2019   left side of face/eye/neck/ear     Past Surgical History:  Procedure Laterality Date  . COLONOSCOPY W/ POLYPECTOMY  2003;2010;04/2014   Tubular adenoma: recall 5 yrs (after 04/2019)  . HERNIA REPAIR  07/2008   Dr.Martin  . MOHS SURGERY     left hand  . 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  RADIOFREQUENCY ABLATION  01/15/2007   for ectopic atrial foci  . SEPTOPLASTY  Age 21  . TRANSTHORACIC ECHOCARDIOGRAM  09/02/2013   Normal.  EF 60%.  LAE and RAE.    Allergies  Allergen Reactions  . Levofloxacin Rash  . Neosporin [Neomycin-Bacitracin Zn-Polymyx] Other (See Comments)    Dry/scaly skin  . Tape Other (See Comments)    ADHESIVE TAPE; BLISTERS,    Family History  Problem Relation Age of Onset  . Lung disease Father        Black Lung  . Heart disease Father        Congenital  . Emphysema Father   . COPD Father   . Diabetes Mother   . Stroke Mother 61  . Hypertension Brother   . Heart disease Brother   . Hyperlipidemia Brother   . Heart attack Brother 21       sudden death  . Cancer Neg Hx   . Colon cancer Neg Hx   . Esophageal cancer Neg Hx      Social History   Socioeconomic History  . Marital status: Married    Spouse name: Not on file  . Number of children: Not on file  . Years of education: Not on file  . Highest education level: Not on file  Occupational History  . Not on file  Tobacco  Use  . Smoking status: Former Smoker    Quit date: 09/06/2007    Years since quitting: 12.0  . Smokeless tobacco: Never Used  . Tobacco comment: smoked 1963 -2009; total 5 packs of cigarettes in life . Cigars < 1 / day on average 1992-2009  Substance and Sexual Activity  . Alcohol use: Yes    Alcohol/week: 7.0 standard drinks    Types: 7 Glasses of wine per week    Comment: Socially  . Drug use: No  . Sexual activity: Not Currently    Partners: Female  Other Topics Concern  . Not on file  Social History Narrative   Married, 1 son, 2 grand-daughters.   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   Alcohol: 1 glass red wine per night.   Was once a long distance runner.   Social Determinants of Health   Financial Resource Strain:   . Difficulty of Paying Living Expenses: Not on file  Food Insecurity:   . Worried  About Charity fundraiser in the Last Year: Not on file  . Ran Out of Food in the Last Year: Not on file  Transportation Needs:   . Lack of Transportation (Medical): Not on file  . Lack of Transportation (Non-Medical): Not on file  Physical Activity:   . Days of Exercise per Week: Not on file  . Minutes of Exercise per Session: Not on file  Stress:   . Feeling of Stress : Not on file  Social Connections:   . Frequency of Communication with Friends and Family: Not on file  . Frequency of Social Gatherings with Friends and Family: Not on file  . Attends Religious Services: Not on file  . Active Member of Clubs or Organizations: Not on file  . Attends Archivist Meetings: Not on file  . Marital Status: Not on file  Intimate Partner Violence:   . Fear of Current or Ex-Partner: Not on file  . Emotionally Abused: Not on file  . Physically Abused: Not on file  . Sexually Abused: Not on file     PHYSICAL EXAM:  VS: Ht _0  (1.905 m)   Wt 205 lb (93 kg)   BMI 25.62 kg/m  Physical Exam Gen: NAD, alert, cooperative with exam, well-appearing ENT: normal lips, normal nasal mucosa,  Eye: normal EOM, normal conjunctiva and lids CV:  no edema, +2 pedal pulses   Resp: no accessory muscle use, non-labored,  Skin: no rashes, no areas of induration  Neuro: normal tone, normal sensation to touch Psych:  normal insight, alert and oriented MSK:  Left shoulder: Pain with external rotation. Normal strength to resistance  Mild pain with empty can  Near normal flexion  NVI      ASSESSMENT & PLAN:   Shoulder dislocation, left, initial encounter Improvement of his symptoms.  Still has limitations with full flexion and external rotation.  Has been using the sling. -Discontinue the sling. -X-ray. -Counseled on home exercise therapy and supportive care. -If no improvement will consider injection and physical therapy.

## 2019-09-09 NOTE — Patient Instructions (Signed)
Good to see you Please try the range of motion movements in the beginning and then the strengthening  Please try ice   Please send me a message in MyChart with any questions or updates.  Please see me back in 4-6 weeks and we can try an injection no better.   --Dr. Raeford Razor

## 2019-09-09 NOTE — Assessment & Plan Note (Signed)
Improvement of his symptoms.  Still has limitations with full flexion and external rotation.  Has been using the sling. -Discontinue the sling. -X-ray. -Counseled on home exercise therapy and supportive care. -If no improvement will consider injection and physical therapy.

## 2019-09-10 ENCOUNTER — Telehealth: Payer: Self-pay | Admitting: Family Medicine

## 2019-09-10 NOTE — Telephone Encounter (Signed)
Spoke with patient about results.   Rosemarie Ax, MD [facility] 09/10/2019, 2:41 PM

## 2019-09-20 ENCOUNTER — Encounter: Payer: Self-pay | Admitting: Family Medicine

## 2019-10-14 ENCOUNTER — Ambulatory Visit: Payer: Medicare Other | Admitting: Family Medicine

## 2019-10-15 ENCOUNTER — Other Ambulatory Visit: Payer: Self-pay

## 2019-10-15 ENCOUNTER — Ambulatory Visit (INDEPENDENT_AMBULATORY_CARE_PROVIDER_SITE_OTHER): Payer: Medicare Other | Admitting: Family Medicine

## 2019-10-15 ENCOUNTER — Encounter: Payer: Self-pay | Admitting: Family Medicine

## 2019-10-15 VITALS — BP 148/95 | HR 73 | Ht 75.0 in | Wt 210.0 lb

## 2019-10-15 DIAGNOSIS — S43005D Unspecified dislocation of left shoulder joint, subsequent encounter: Secondary | ICD-10-CM

## 2019-10-15 NOTE — Progress Notes (Signed)
Mike Day - 74 y.o. male MRN [id number]  Date of birth: [birth year]  SUBJECTIVE:  Including CC & ROS.  Chief Complaint  Patient presents with  . Follow-up    follow up for left shoulder    Mike Day is a 74 y.o. male that is following up for his left shoulder injury.  He has pain infrequently and that is mild.  He has good range of motion.  He denies any significant pain.  It does occur laterally.   Review of Systems See HPI   HISTORY: Past Medical, Surgical, Social, and Family History Reviewed & Updated per EMR.   Pertinent Historical Findings include:  Past Medical History:  Diagnosis Date  . Anemia    Early 20's  . Atrial fibrillation ([place]) 10/07/2014   3-14 day cardiac monitor 03/2019:  AFib, Avg HR 69, no significant pauses, PVCs, wide complex runs (aberrancy vs NSVT - longest 32 beats).  . Atrial flutter [facility]) 2008   Status post RFCA Dr Cristopher Peru 2008  . Chronic atrial fibrillation (HCC)    Eliquis  . Diabetes mellitus without complication ([place])    Dx by A1c criteria 2014  . Echocardiograms    Echo 12/14 - Severe basal septal hypertrophy, moderate LVH, EF 60-65%, moderate LAE, mild RAE // Echo 7/20: EF 60-65, severe asymmetric LVH, mild RVE, normal RV SF, moderate BAE, moderate MAC, moderate aortic valve calcification (noncoronary cusp), mild dilation of ascending aorta (41 mm)  . Hx of adenomatous colonic polyps   . Hyperlipidemia   . Hypertension   . Malignant neoplasm of prostate ([place]) 05/18/2012  . Prostate cancer [facility]) 2006   Prostatectomy  . Shingles 01/2019   left side of face/eye/neck/ear     Past Surgical History:  Procedure Laterality Date  . COLONOSCOPY W/ POLYPECTOMY  2003;2010;04/2014   Tubular adenoma: recall 5 yrs (after 04/2019)  . HERNIA REPAIR  07/2008   Dr.Martin  . MOHS SURGERY     left hand  . 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 RADIOFREQUENCY ABLATION  01/15/2007   for ectopic atrial foci  .  SEPTOPLASTY  Age 85  . TRANSTHORACIC ECHOCARDIOGRAM  09/02/2013   Normal.  EF 60%.  LAE and RAE.    Allergies  Allergen Reactions  . Levofloxacin Rash  . Neosporin [Neomycin-Bacitracin Zn-Polymyx] Other (See Comments)    Dry/scaly skin  . Tape Other (See Comments)    ADHESIVE TAPE; BLISTERS,    Family History  Problem Relation Age of Onset  . Lung disease Father        Black Lung  . Heart disease Father        Congenital  . Emphysema Father   . COPD Father   . Diabetes Mother   . Stroke Mother 47  . Hypertension Brother   . Heart disease Brother   . Hyperlipidemia Brother   . Heart attack Brother 29       sudden death  . Cancer Neg Hx   . Colon cancer Neg Hx   . Esophageal cancer Neg Hx      Social History   Socioeconomic History  . Marital status: Married    Spouse name: Not on file  . Number of children: Not on file  . Years of education: Not on file  . Highest education level: Not on file  Occupational History  . Not on file  Tobacco Use  . Smoking status: Former Smoker  Quit date: 09/06/2007    Years since quitting: 12.1  . Smokeless tobacco: Never Used  . Tobacco comment: smoked 1963 -2009; total 5 packs of cigarettes in life . Cigars < 1 / day on average 1992-2009  Substance and Sexual Activity  . Alcohol use: Yes    Alcohol/week: 7.0 standard drinks    Types: 7 Glasses of wine per week    Comment: Socially  . Drug use: No  . Sexual activity: Not Currently    Partners: Female  Other Topics Concern  . Not on file  Social History Narrative   Married, 1 son, 2 grand-daughters.   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   Alcohol: 1 glass red wine per night.   Was once a long distance runner.   Social Determinants of Health   Financial Resource Strain:   . Difficulty of Paying Living Expenses: Not on file  Food Insecurity:   . Worried About Charity fundraiser in the Last Year: Not on file  . Ran  Out of Food in the Last Year: Not on file  Transportation Needs:   . Lack of Transportation (Medical): Not on file  . Lack of Transportation (Non-Medical): Not on file  Physical Activity:   . Days of Exercise per Week: Not on file  . Minutes of Exercise per Session: Not on file  Stress:   . Feeling of Stress : Not on file  Social Connections:   . Frequency of Communication with Friends and Family: Not on file  . Frequency of Social Gatherings with Friends and Family: Not on file  . Attends Religious Services: Not on file  . Active Member of Clubs or Organizations: Not on file  . Attends Archivist Meetings: Not on file  . Marital Status: Not on file  Intimate Partner Violence:   . Fear of Current or Ex-Partner: Not on file  . Emotionally Abused: Not on file  . Physically Abused: Not on file  . Sexually Abused: Not on file     PHYSICAL EXAM:  VS: BP (!) 148/95   Pulse 73   Ht _0  (1.905 m)   Wt 210 lb (95.3 kg)   BMI 26.25 kg/m  Physical Exam Gen: NAD, alert, cooperative with exam, well-appearing ENT: normal lips, normal nasal mucosa,  Eye: normal EOM, normal conjunctiva and lids Skin: no rashes, no areas of induration  Neuro: normal tone, normal sensation to touch Psych:  normal insight, alert and oriented MSK:  Left shoulder: Normal active flexion and abduction. Normal internal and external rotation. Normal strength resistance. Neurovascularly intact     ASSESSMENT & PLAN:   Shoulder dislocation, left, subsequent encounter Continues to have improvement of his range of motion and strength.  Pain is intermittent and mild. -Counseled on home exercise therapy and supportive care. -Could always consider an intra-articular injection or physical therapy. -Follow-up as needed.

## 2019-10-15 NOTE — Assessment & Plan Note (Signed)
Continues to have improvement of his range of motion and strength.  Pain is intermittent and mild. -Counseled on home exercise therapy and supportive care. -Could always consider an intra-articular injection or physical therapy. -Follow-up as needed.

## 2019-11-14 DIAGNOSIS — C44519 Basal cell carcinoma of skin of other part of trunk: Secondary | ICD-10-CM | POA: Diagnosis not present

## 2019-11-14 DIAGNOSIS — L821 Other seborrheic keratosis: Secondary | ICD-10-CM | POA: Diagnosis not present

## 2019-11-14 DIAGNOSIS — Z85828 Personal history of other malignant neoplasm of skin: Secondary | ICD-10-CM | POA: Diagnosis not present

## 2019-11-14 DIAGNOSIS — L570 Actinic keratosis: Secondary | ICD-10-CM | POA: Diagnosis not present

## 2019-11-14 DIAGNOSIS — C44619 Basal cell carcinoma of skin of left upper limb, including shoulder: Secondary | ICD-10-CM | POA: Diagnosis not present

## 2019-11-14 DIAGNOSIS — C44629 Squamous cell carcinoma of skin of left upper limb, including shoulder: Secondary | ICD-10-CM | POA: Diagnosis not present

## 2019-11-14 DIAGNOSIS — D485 Neoplasm of uncertain behavior of skin: Secondary | ICD-10-CM | POA: Diagnosis not present

## 2019-11-14 DIAGNOSIS — L820 Inflamed seborrheic keratosis: Secondary | ICD-10-CM | POA: Diagnosis not present

## 2019-11-25 ENCOUNTER — Other Ambulatory Visit: Payer: Self-pay | Admitting: Cardiovascular Disease

## 2019-11-25 NOTE — Telephone Encounter (Signed)
Prescription refill request for Eliquis received.  Last office visit: Mike Day 05/29/2019 Scr: 0.94, 03/25/2019  Age: 74 y.o. Weight: 95.3 kg   Prescription refill sent.

## 2019-11-26 ENCOUNTER — Other Ambulatory Visit: Payer: Self-pay

## 2019-11-26 ENCOUNTER — Ambulatory Visit (INDEPENDENT_AMBULATORY_CARE_PROVIDER_SITE_OTHER): Payer: Medicare Other | Admitting: Physician Assistant

## 2019-11-26 ENCOUNTER — Encounter: Payer: Self-pay | Admitting: Physician Assistant

## 2019-11-26 VITALS — BP 130/60 | HR 61 | Ht 75.0 in | Wt 200.8 lb

## 2019-11-26 DIAGNOSIS — I10 Essential (primary) hypertension: Secondary | ICD-10-CM | POA: Diagnosis not present

## 2019-11-26 DIAGNOSIS — I4821 Permanent atrial fibrillation: Secondary | ICD-10-CM

## 2019-11-26 DIAGNOSIS — E782 Mixed hyperlipidemia: Secondary | ICD-10-CM | POA: Diagnosis not present

## 2019-11-26 NOTE — Patient Instructions (Signed)
Medication Instructions:   Your physician recommends that you continue on your current medications as directed. Please refer to the Current Medication list given to you today.  *If you need a refill on your cardiac medications before your next appointment, please call your pharmacy*  Lab Work:  None ordered today  Testing/Procedures:  None ordered today  Follow-Up: At [facility], you and your health needs are our priority.  As part of our continuing mission to provide you with exceptional heart care, we have created designated Provider Care Teams.  These Care Teams include your primary Cardiologist (physician) and Advanced Practice Providers (APPs -  Physician Assistants and Nurse Practitioners) who all work together to provide you with the care you need, when you need it.  We recommend signing up for the patient portal called "MyChart".  Sign up information is provided on this After Visit Summary.  MyChart is used to connect with patients for Virtual Visits (Telemedicine).  Patients are able to view lab/test results, encounter notes, upcoming appointments, etc.  Non-urgent messages can be sent to your provider as well.   To learn more about what you can do with MyChart, go to [email or web address].    Your next appointment:   12 month(s)  The format for your next appointment:   In Person  Provider:   [name], PA-C   Other Instructions  When you go to your primary care doctor for your physical, ask their office to draw a BMET, CBC, LFTs, and lipid panel, you will need to be fasting for lipid panel.

## 2019-11-26 NOTE — Progress Notes (Signed)
Cardiology Office Note:    Date:  11/26/2019   ID:  Mike Day, DOB 08-30-1946, MRN [id number]  PCP:  Ma Hillock, DO  Cardiologist:  Sherren Mocha, MD / [name], PA-C  Electrophysiologist:  None   Referring MD: Ma Hillock, DO   Chief Complaint:  Follow-up (AFib)    Patient Profile:    Mike Day is a 74 y.o. male with:   AFlutter s/p RFCA  Permanent AFib ? CHADS2-VASc=3(DM,HTN, age x58). ? Apixaban Rx  Diabetes mellitus  Hypertension   Hyperlipidemia   Prostate CA s/p prostatectomy  Echocardiogram 03/2019: EF 60-65  Event monitor 03/2019: AFib, Avg HR 69, PVCs, wide complex rhythm (NSVT vs aberrancy)  Carotid Dz ? [place] 03/2019: Bilat ICA 1-39  Prior CV studies: Echocardiogram 04/02/2019 EF 60-65, severe asymmetric LVH (no LVOT gradient), normal RVSF, mild RVE, RVSP 24.6, mod BAE, mod MAC, ascending aortal mildly dilated (41 mm)  LT Monitor (3-14 days) 03/13/2019 AFib, avg HR 69, wide complex runs (NSVT vs aberrancy) - longest 32 beats  Carotid US 03/19/2019 Bilateral ICA 1-39  ETT 10/16 No ST segment changes with exercise   Echo 09/02/13  Severe basal septal hypertrophy, moderate LVH, EF 60-65%, moderate LAE, mild RAE  History of Present Illness:    Mike Day was last seen in clinic in 05/2019.  He fell in December and hurt his L shoulder.  He did go to the ED and his head CT was negative for acute findings.    He returns for follow up.    He is here alone.  He is doing well without chest pain, shortness of breath, syncope, leg swelling.  He L shoulder is feeling better.  He has not had any significant bleeding issues.    Past Medical History:  Diagnosis Date  . Anemia    Early 20's  . Atrial fibrillation ([place]) 10/07/2014   3-14 day cardiac monitor 03/2019:  AFib, Avg HR 69, no significant pauses, PVCs, wide complex runs (aberrancy vs NSVT - longest 32 beats).  . Atrial flutter [facility]) 2008   Status post RFCA Dr Cristopher Peru 2008  .  Chronic atrial fibrillation (HCC)    Eliquis  . Diabetes mellitus without complication ([place])    Dx by A1c criteria 2014  . Echocardiograms    Echo 12/14 - Severe basal septal hypertrophy, moderate LVH, EF 60-65%, moderate LAE, mild RAE // Echo 7/20: EF 60-65, severe asymmetric LVH, mild RVE, normal RV SF, moderate BAE, moderate MAC, moderate aortic valve calcification (noncoronary cusp), mild dilation of ascending aorta (41 mm)  . Hx of adenomatous colonic polyps   . Hyperlipidemia   . Hypertension   . Malignant neoplasm of prostate ([place]) 05/18/2012  . Prostate cancer [facility]) 2006   Prostatectomy  . Shingles 01/2019   left side of face/eye/neck/ear     Current Medications: Current Meds  Medication Sig  . amLODipine (NORVASC) 2.5 MG tablet Take 2.5 mg by mouth daily.  [name] ELIQUIS 5 MG TABS tablet TAKE 1 TABLET TWICE A DAY  . lisinopril (ZESTRIL) 20 MG tablet Take 1 tablet (20 mg total) by mouth daily.  . simvastatin (ZOCOR) 20 MG tablet TAKE 1 TABLET AT BEDTIME     Allergies:   Levofloxacin, Neosporin [neomycin-bacitracin zn-polymyx], and Tape   Social History   Tobacco Use  . Smoking status: Former Smoker    Quit date: 09/06/2007    Years since quitting: 12.2  . Smokeless tobacco: Never Used  . Tobacco  comment: smoked 1963 -2009; total 5 packs of cigarettes in life . Cigars < 1 / day on average 1992-2009  Substance Use Topics  . Alcohol use: Yes    Alcohol/week: 7.0 standard drinks    Types: 7 Glasses of wine per week    Comment: Socially  . Drug use: No     Family Hx: The patient's family history includes COPD in his father; Diabetes in his mother; Emphysema in his father; Heart attack (age of onset: 65) in his brother; Heart disease in his brother and father; Hyperlipidemia in his brother; Hypertension in his brother; Lung disease in his father; Stroke (age of onset: 77) in his mother. There is no history of Cancer, Colon cancer, or Esophageal cancer.  ROS   EKGs/Labs/Other  Test Reviewed:    EKG:  EKG is   ordered today.  The ekg ordered today demonstrates atrial fibrillation, HR 61, normal axis, PVC, no ischemic ST-TW changes, QTc 442, no change from prior ECG.  Recent Labs: 03/14/2019: ALT 15; Hemoglobin 15.0; Platelets 201.0; TSH 1.12 03/25/2019: BUN 13; Creatinine, Ser 0.94; Potassium 4.1; Sodium 133   Recent Lipid Panel Lab Results  Component Value Date/Time   CHOL 119 02/06/2018 09:28 AM   TRIG 55.0 02/06/2018 09:28 AM   HDL 43.30 02/06/2018 09:28 AM   CHOLHDL 3 02/06/2018 09:28 AM   LDLCALC 65 02/06/2018 09:28 AM    Physical Exam:    VS:  BP 130/60   Pulse 61   Ht _0  (1.905 m)   Wt 200 lb 12.8 oz (91.1 kg)   SpO2 97%   BMI 25.10 kg/m     Wt Readings from Last 3 Encounters:  11/26/19 200 lb 12.8 oz (91.1 kg)  10/15/19 210 lb (95.3 kg)  09/09/19 205 lb (93 kg)     Constitutional:      Appearance: Healthy appearance. Not in distress.  Neck:     Thyroid: No thyromegaly.     Vascular: JVD normal.  Pulmonary:     Breath sounds: No wheezing. No rales.  Cardiovascular:     Normal rate. Irregularly irregular rhythm. Normal S1. Normal S2.     Murmurs: There is no murmur.  Edema:    Peripheral edema absent.  Abdominal:     Palpations: Abdomen is soft. There is no hepatomegaly.  Skin:    General: Skin is warm and dry.  Neurological:     General: No focal deficit present.     Mental Status: Alert and oriented to person, place and time.       ASSESSMENT & PLAN:    1. Permanent atrial fibrillation (HCC) Rate is controlled.  He is tolerating anticoagulation with Apixaban.  Continue current dose.  When he has his next physical with his PCP, I asked him to get a BMET, CBC drawn and have it forwarded to me.  Follow up 1 year.   2. Essential hypertension The patient's blood pressure is controlled on his current regimen.  Continue current therapy.    3. Mixed hyperlipidemia Continue Simvastatin 20 mg daily at bedtime.  He should have  fasting Lipids and LFTs drawn with his next exam with this PCP.  I have asked him to have the results sent to me for review.      Dispo:  Return in about 1 year (around 11/25/2020) for Routine Follow Up, w/ [name], PA-C, in person.   Medication Adjustments/Labs and Tests Ordered: Current medicines are reviewed at length with the patient today.  Concerns regarding medicines are outlined above.  Tests Ordered: Orders Placed This Encounter  Procedures  . EKG 12-Lead   Medication Changes: No orders of the defined types were placed in this encounter.   Signed, [name], PA-C  11/26/2019 2:30 PM    [facility] [address], [place], [place]  [address] Phone: [phone]; Fax: [phone]

## 2019-12-13 ENCOUNTER — Ambulatory Visit: Payer: Medicare Other | Admitting: Family Medicine

## 2019-12-16 ENCOUNTER — Encounter: Payer: Self-pay | Admitting: Family Medicine

## 2019-12-16 ENCOUNTER — Other Ambulatory Visit: Payer: Self-pay

## 2019-12-16 ENCOUNTER — Ambulatory Visit (INDEPENDENT_AMBULATORY_CARE_PROVIDER_SITE_OTHER): Payer: Medicare Other | Admitting: Family Medicine

## 2019-12-16 VITALS — BP 128/84 | HR 59 | Temp 97.9°F | Resp 16 | Ht 75.0 in | Wt 196.5 lb

## 2019-12-16 DIAGNOSIS — E782 Mixed hyperlipidemia: Secondary | ICD-10-CM

## 2019-12-16 DIAGNOSIS — E875 Hyperkalemia: Secondary | ICD-10-CM | POA: Diagnosis not present

## 2019-12-16 DIAGNOSIS — R1909 Other intra-abdominal and pelvic swelling, mass and lump: Secondary | ICD-10-CM | POA: Diagnosis not present

## 2019-12-16 DIAGNOSIS — I10 Essential (primary) hypertension: Secondary | ICD-10-CM | POA: Diagnosis not present

## 2019-12-16 DIAGNOSIS — Z8546 Personal history of malignant neoplasm of prostate: Secondary | ICD-10-CM | POA: Diagnosis not present

## 2019-12-16 DIAGNOSIS — Z7901 Long term (current) use of anticoagulants: Secondary | ICD-10-CM | POA: Diagnosis not present

## 2019-12-16 DIAGNOSIS — I4891 Unspecified atrial fibrillation: Secondary | ICD-10-CM | POA: Diagnosis not present

## 2019-12-16 DIAGNOSIS — E119 Type 2 diabetes mellitus without complications: Secondary | ICD-10-CM

## 2019-12-16 NOTE — Progress Notes (Signed)
Mike Day , Jun 12, 1946, 74 y.o., male MRN: [id number] Patient Care Team    Relationship Specialty Notifications Start End  Ma Hillock, DO PCP - General Family Medicine  06/11/18   Sherren Mocha, MD PCP - Cardiology Cardiology Admissions 06/22/18    Comment: PA:  [name], PA-C  Rod Can, MD Consulting Physician Orthopedic Surgery  10/03/15   Sharmon Revere Physician Assistant Cardiology  04/29/16   Rolm Bookbinder, MD Consulting Physician Dermatology  08/24/17   Myrlene Broker, MD Attending Physician Urology  06/11/18   Warden Fillers, MD Consulting Physician Ophthalmology  06/11/18     Chief Complaint  Patient presents with  . Hernia    Lower left Groin, x3-4 weeks. No pain but does have swelling.   [name] Hyperlipidemia    Not Fasting.      Subjective: Mike Day is a 74 y.o. male present for [facility]. Atrial fibrillation, unspecified type (HCC)/Essential hypertension/Chronic anticoagulation/HLD Pt reports compliance with lisinopril 5 mg daily, amlodipine 2.5 mg daily, Eliquis 5 mg daily and simvastatin 20 mg daily. Blood pressures ranges at home within normal limits. Patient denies chest pain, shortness of breath or lower extremity edema.   He is followed by cardiology who prescribes all his medications. Labs are due today.  Diabetes mellitus without complication (HCC)/hyperk Patient has declined start of medication concerning his elevated A1c and has continued to work on dietary and exercise modifications to control his diabetes.  He has been counseled that an A1c of 6.9 is considered diabetic.  Denies numbness, tingling of extremities, hypo/hyperglycemic events or non-healing wounds. PNA series: Completed Flu shot: Up-to-date 2020 (recommneded yearly) BMP: Elected today Foot exam: Completed 02/2019 Eye exam: 2019-overdue A1c: 6.9  Left pelvic mass: Patient reports he has noticed a new groin mass over the last 3-4 weeks.  He denies pain associated  with the mass.  He states that it is sometimes more pronounced.  He denies any bowel changes or blood in his stool.  He denies any abdominal pain, fever, nausea or vomiting.  He states he had a hernia in his groin many years ago, but does not recall which side.  Colonoscopy completed by Dr. Fuller Plan 04/2014 with 5-year recall.  Patient is due for recall.  Depression screen [facility] 2/9 02/26/2019 06/05/2018 08/24/2017 06/05/2015 07/02/2013  Decreased Interest 0 0 0 0 0  Down, Depressed, Hopeless 0 0 0 0 0  PHQ - 2 Score 0 0 0 0 0    Allergies  Allergen Reactions  . Levofloxacin Rash  . Neosporin [Neomycin-Bacitracin Zn-Polymyx] Other (See Comments)    Dry/scaly skin  . Tape Other (See Comments)    ADHESIVE TAPE; BLISTERS,   Social History   Social History Narrative   Married, 1 son, 2 grand-daughters.   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   Alcohol: 1 glass red wine per night.   Was once a long distance runner.   Past Medical History:  Diagnosis Date  . Anemia    Early 20's  . Atrial fibrillation ([place]) 10/07/2014   3-14 day cardiac monitor 03/2019:  AFib, Avg HR 69, no significant pauses, PVCs, wide complex runs (aberrancy vs NSVT - longest 32 beats).  . Atrial flutter [facility]) 2008   Status post RFCA Dr Cristopher Peru 2008  . Chronic atrial fibrillation (HCC)    Eliquis  . Diabetes mellitus without complication ([place])    Dx by  A1c criteria 2014  . Echocardiograms    Echo 12/14 - Severe basal septal hypertrophy, moderate LVH, EF 60-65%, moderate LAE, mild RAE // Echo 7/20: EF 60-65, severe asymmetric LVH, mild RVE, normal RV SF, moderate BAE, moderate MAC, moderate aortic valve calcification (noncoronary cusp), mild dilation of ascending aorta (41 mm)  . Hx of adenomatous colonic polyps   . Hyperlipidemia   . Hypertension   . Malignant neoplasm of prostate ([place]) 05/18/2012  . Prostate cancer [facility]) 2006   Prostatectomy  . Shingles 01/2019   left  side of face/eye/neck/ear    Past Surgical History:  Procedure Laterality Date  . COLONOSCOPY W/ POLYPECTOMY  2003;2010;04/2014   Tubular adenoma: recall 5 yrs (after 04/2019)  . HERNIA REPAIR  07/2008   Dr.Martin  . MOHS SURGERY     left hand  . 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 RADIOFREQUENCY ABLATION  01/15/2007   for ectopic atrial foci  . SEPTOPLASTY  Age 67  . TRANSTHORACIC ECHOCARDIOGRAM  09/02/2013   Normal.  EF 60%.  LAE and RAE.   Family History  Problem Relation Age of Onset  . Lung disease Father        Black Lung  . Heart disease Father        Congenital  . Emphysema Father   . COPD Father   . Diabetes Mother   . Stroke Mother 72  . Hypertension Brother   . Heart disease Brother   . Hyperlipidemia Brother   . Heart attack Brother 98       sudden death  . Cancer Neg Hx   . Colon cancer Neg Hx   . Esophageal cancer Neg Hx    Allergies as of 12/16/2019      Reactions   Levofloxacin Rash   Neosporin [neomycin-bacitracin Zn-polymyx] Other (See Comments)   Dry/scaly skin   Tape Other (See Comments)   ADHESIVE TAPE; BLISTERS,      Medication List       Accurate as of December 16, 2019  4:41 PM. If you have any questions, ask your nurse or doctor.        amLODipine 2.5 MG tablet Commonly known as: NORVASC Take 2.5 mg by mouth daily.   Eliquis 5 MG Tabs tablet Generic drug: apixaban TAKE 1 TABLET TWICE A DAY   lisinopril 20 MG tablet Commonly known as: ZESTRIL Take 1 tablet (20 mg total) by mouth daily.   simvastatin 20 MG tablet Commonly known as: ZOCOR TAKE 1 TABLET AT BEDTIME       All past medical history, surgical history, allergies, family history, immunizations andmedications were updated in the EMR today and reviewed under the history and medication portions of their EMR.     ROS: Negative, with the exception of above mentioned in HPI   Objective:  BP 128/84 (BP Location: Right Arm, Patient Position:  Sitting, Cuff Size: Normal)   Pulse (!) 59   Temp 97.9 F (36.6 C) (Temporal)   Resp 16   Ht 6' 3" (1.905 m)   Wt 196 lb 8 oz (89.1 kg)   SpO2 99%   BMI 24.56 kg/m  Body mass index is 24.56 kg/m. Gen: Afebrile. No acute distress.  HENT: AT. [place].  Eyes:Pupils Equal Round Reactive to light, Extraocular movements intact,  Conjunctiva without redness, discharge or icterus. Neck/lymp/endocrine: Supple, no lymphadenopathy, no thyromegaly CV: RRR no murmur, no edema, +2/4 P posterior tibialis pulses Chest: CTAB, no wheeze  or crackles Abd: Soft.  Nontender baseball sized nonreducible mass left lower pelvis. BS present.  Skin: No rashes, purpura or petechiae.  No skin changes. Neuro: Normal gait. PERLA. EOMi. Alert. Oriented x3  Psych: Normal affect, dress and demeanor. Normal speech. Normal thought content and judgment.   No exam data present No results found. No results found for this or any previous visit (from the past 24 hour(s)).  Assessment/Plan: Mike Day is a 74 y.o. male present for OV for  Atrial fibrillation, unspecified type (HCC)/Chronic anticoagulation Mixed hyperlipidemia Patient is following routinely with his cardiologist.  Conditions are stable. Continue lisinopril 20 mg daily, Eliquis 5 mg daily, Zocor 20 mg daily, Norvasc 2.5 mg daily.  All medications are prescribed by his cardiology team. -He is due for his labs today CBC, CMP, TSH and lipid panel collected today.  Continue to follow with cardiology.  If desiring labs collected here yearly, will need yearly appointment for chronic medical conditions.  Diabetes mellitus without complication ([place]) He is not accepting of diabetes diagnosis- last a1c 6.9. -He has declined diabetes treatment for routine follow-up for this condition.  He has worked on his diet and exercise to help control this condition.  Patient has been educated surrounding diabetes and potential risk associated without treatment and control of  diabetes condition. Including increase in CV risk.   -A1c collected today.  Left groin mass/history of prostate cancer: -Likely inguinal hernia given location.  Currently not painful to have and no other symptoms to report.  Palpated mass today is not reducible and is about the size of a baseball.  He does have a history of prostate cancer, therefore believe imaging is warranted to further evaluate pelvic mass prior to referral.  If indeed it does reveal to be a inguinal hernia, patient has seen Dr. Johnathan Hausen [facility] surgery) in the past and would like referral be placed to him if possible.   Reviewed expectations re: course of current medical issues.  Discussed self-management of symptoms.  Outlined signs and symptoms indicating need for more acute intervention.  Patient verbalized understanding and all questions were answered.  Patient received an After-Visit Summary.    Orders Placed This Encounter  Procedures  . CT Abdomen Pelvis W Contrast  . Comp Met (CMET)  . CBC w/Diff  . Hemoglobin A1c  . Lipid panel   No orders of the defined types were placed in this encounter.  Referral Orders  No referral(s) requested today       Note is dictated utilizing voice recognition software. Although note has been proof read prior to signing, occasional typographical errors still can be missed. If any questions arise, please do not hesitate to call for verification.   electronically signed by:  Howard Pouch, DO  [place]

## 2019-12-16 NOTE — Patient Instructions (Signed)
We will call you with lab results.  They will also call you to get you scheduled for an Ultrasound of the groin mass. We will call you with those results once we receive them.

## 2019-12-17 ENCOUNTER — Telehealth: Payer: Self-pay | Admitting: Family Medicine

## 2019-12-17 LAB — COMPREHENSIVE METABOLIC PANEL
ALT: 13 U/L (ref 0–53)
AST: 15 U/L (ref 0–37)
Albumin: 4.4 g/dL (ref 3.5–5.2)
Alkaline Phosphatase: 61 U/L (ref 39–117)
BUN: 11 mg/dL (ref 6–23)
CO2: 28 mEq/L (ref 19–32)
Calcium: 9.4 mg/dL (ref 8.4–10.5)
Chloride: 98 mEq/L (ref 96–112)
Creatinine, Ser: 0.77 mg/dL (ref 0.40–1.50)
GFR: 98.84 mL/min (ref >60.00)
Glucose, Bld: 114 mg/dL — ABNORMAL HIGH (ref 70–99)
Potassium: 4.1 mEq/L (ref 3.5–5.1)
Sodium: 132 mEq/L — ABNORMAL LOW (ref 135–145)
Total Bilirubin: 1.1 mg/dL (ref 0.2–1.2)
Total Protein: 6.7 g/dL (ref 6.0–8.3)

## 2019-12-17 LAB — CBC WITH DIFFERENTIAL/PLATELET
Basophils Absolute: 0 10*3/uL (ref 0.0–0.1)
Basophils Relative: 0.7 % (ref 0.0–3.0)
Eosinophils Absolute: 0.1 10*3/uL (ref 0.0–0.7)
Eosinophils Relative: 1.9 % (ref 0.0–5.0)
HCT: 43.4 % (ref 39.0–52.0)
Hemoglobin: 14.8 g/dL (ref 13.0–17.0)
Lymphocytes Relative: 17.7 % (ref 12.0–46.0)
Lymphs Abs: 1 10*3/uL (ref 0.7–4.0)
MCHC: 34.1 g/dL (ref 30.0–36.0)
MCV: 98.7 fl (ref 78.0–100.0)
Monocytes Absolute: 0.5 10*3/uL (ref 0.1–1.0)
Monocytes Relative: 9.5 % (ref 3.0–12.0)
Neutro Abs: 4 10*3/uL (ref 1.4–7.7)
Neutrophils Relative %: 70.2 % (ref 43.0–77.0)
Platelets: 174 10*3/uL (ref 150.0–400.0)
RBC: 4.4 Mil/uL (ref 4.22–5.81)
RDW: 13.1 % (ref 11.5–15.5)
WBC: 5.7 10*3/uL (ref 4.0–10.5)

## 2019-12-17 LAB — LIPID PANEL
Cholesterol: 117 mg/dL (ref 0–200)
HDL: 45.9 mg/dL (ref >39.00)
LDL Cholesterol: 59 mg/dL (ref 0–99)
NonHDL: 71.1
Total CHOL/HDL Ratio: 3
Triglycerides: 63 mg/dL (ref 0.0–149.0)
VLDL: 12.6 mg/dL (ref 0.0–40.0)

## 2019-12-17 LAB — HEMOGLOBIN A1C: Hgb A1c MFr Bld: 6.6 % — ABNORMAL HIGH (ref 4.6–6.5)

## 2019-12-17 NOTE — Telephone Encounter (Signed)
Pt was called and given information/insructions, he verbalized understanding

## 2019-12-17 NOTE — Telephone Encounter (Signed)
Please inform patient the following information: First of all,  I ordered a CT pelvis instead of the ultrasound to further evaluate his pelvic mass.  I felt this was more ideal imaging modality given his prior history of prostate cancer to obtain a better visualization of mass.  These make him aware.  As far as his labs: His liver, kidneys and thyroid levels are normal. Cholesterol panel looks great with a total cholesterol of 117 HDL 45, LDL 59 and triglycerides of 63. Blood cell levels are all in normal range. His sodium is low at 132, the rest of his electrolytes are normal.  Normal sodium level started at 135.  He has run mildly low on his sodium for many years therefore 132 is a stable sodium level for him.   His A1c, which monitors diabetes is 6.6.  This level was still in the diabetic range however he is well controlled diet and exercise alone.  Lastly, when reviewing his health maintenance it appears he was possibly due for his colonoscopy August/2020.  If this is accurate I would encourage him to get in touch with Dr. Fuller Plan his gastroenterologist to discuss.

## 2020-01-01 ENCOUNTER — Other Ambulatory Visit: Payer: Self-pay

## 2020-01-01 ENCOUNTER — Ambulatory Visit
Admission: RE | Admit: 2020-01-01 | Discharge: 2020-01-01 | Disposition: A | Discharge status: Home / Self Care | Payer: Medicare Other | Source: Ambulatory Visit | Attending: Family Medicine | Admitting: Family Medicine

## 2020-01-01 DIAGNOSIS — K4020 Bilateral inguinal hernia, without obstruction or gangrene, not specified as recurrent: Secondary | ICD-10-CM | POA: Diagnosis not present

## 2020-01-01 DIAGNOSIS — Z8546 Personal history of malignant neoplasm of prostate: Secondary | ICD-10-CM

## 2020-01-01 DIAGNOSIS — R1909 Other intra-abdominal and pelvic swelling, mass and lump: Secondary | ICD-10-CM

## 2020-01-01 MED ORDER — IOPAMIDOL (ISOVUE-300) INJECTION 61%
100.0000 mL | Freq: Once | INTRAVENOUS | Status: AC | PRN
  Administered 2020-01-01: 100 mL via INTRAVENOUS

## 2020-01-02 ENCOUNTER — Telehealth: Payer: Self-pay | Admitting: Family Medicine

## 2020-01-02 DIAGNOSIS — K4090 Unilateral inguinal hernia, without obstruction or gangrene, not specified as recurrent: Secondary | ICD-10-CM

## 2020-01-02 NOTE — Telephone Encounter (Signed)
Please inform patient: His CT scan resulted with small inguinal hernias on both the left and the right side, both which were containing fat only and no colon  Involved.  His left inguinal hernia is doing we are most concerned about given when he stands he does have a rather significant protrusion.  I have referred him back to [facility] surgery, with his request of Dr. Johnathan Hausen.  They will call him to get him scheduled.  If he does not hear from them within the next 2 weeks he should call our office so we can check on the referral for him.  Lastly, just a reminder- if he has not already scheduled his appointment with Dr. Fuller Plan ,his gastroenterologist, for colonoscopy repeat he was due last August for his 5-year repeat.

## 2020-01-02 NOTE — Telephone Encounter (Signed)
Pt was called given results/instructions, he verbalized understanding. He made appt with GI MD

## 2020-01-14 ENCOUNTER — Ambulatory Visit (INDEPENDENT_AMBULATORY_CARE_PROVIDER_SITE_OTHER): Payer: Medicare Other | Admitting: Gastroenterology

## 2020-01-14 ENCOUNTER — Encounter: Payer: Self-pay | Admitting: Gastroenterology

## 2020-01-14 ENCOUNTER — Telehealth: Payer: Self-pay

## 2020-01-14 ENCOUNTER — Encounter: Payer: Self-pay | Admitting: Family Medicine

## 2020-01-14 VITALS — BP 134/80 | HR 76 | Temp 98.7°F | Ht 75.0 in | Wt 195.0 lb

## 2020-01-14 DIAGNOSIS — Z86010 Personal history of colonic polyps: Secondary | ICD-10-CM

## 2020-01-14 DIAGNOSIS — Z7901 Long term (current) use of anticoagulants: Secondary | ICD-10-CM

## 2020-01-14 MED ORDER — NA SULFATE-K SULFATE-MG SULF 17.5-3.13-1.6 GM/177ML PO SOLN: 1.0000 | Freq: Once | ORAL | 0 refills | Status: AC

## 2020-01-14 NOTE — Telephone Encounter (Signed)
   Primary Cardiologist: Sherren Mocha, MD  Chart reviewed as part of pre-operative protocol coverage. Patient was contacted 01/14/2020 in reference to pre-operative risk assessment for pending surgery as outlined below.  Mike Day was last seen on 11/26/19 with permanent AFib, atrial flutter, DM, HTN, HLD, prostate Ca, mid carotid disease, LVH. I spoke with patient who affirms he's been doing great without CP, SOB, palpitations, syncope or any problems with exertion. Therefore, based on ACC/AHA guidelines, the patient would be at acceptable risk for the planned procedure without further cardiovascular testing.   Will route to pharm for input on Eliquis and then final rec can be sent to GI.  Charlie Pitter, PA-C 01/14/2020, 5:18 PM

## 2020-01-14 NOTE — Progress Notes (Signed)
    History of Present Illness: This is a 74 year old male with a history of multiple adenomatous colon polyps.  He is maintained on Eliquis for history of paroxysmal atrial fibrillation.  He recently noticed a small swelling in his left groin CT AP on January 01, 2020 showed small bilateral inguinal hernias containing only fat, prior prostatectomy, diffuse colonic diverticulosis.  He notes an occasional slight bulging in his anal area without other symptoms which she feels is likely hemorrhoid.  No other gastrointestinal complaints.  Current Medications, Allergies, Past Medical History, Past Surgical History, Family History and Social History were reviewed in [employer] record.   Physical Exam: General: Well developed, well nourished, no acute distress Head: Normocephalic and atraumatic Eyes:  sclerae anicteric, EOMI Ears: Normal auditory acuity Mouth: Not examined, mask on during Covid-19 pandemic Lungs: Clear throughout to auscultation Heart: Regular rate and rhythm; no murmurs, rubs or bruits Abdomen: Soft, non tender and non distended. No masses, hepatosplenomegaly or hernias noted. Normal Bowel sounds Rectal: Deferred to colonoscopy  Musculoskeletal: Symmetrical with no gross deformities  Pulses:  Normal pulses noted Extremities: No clubbing, cyanosis, edema or deformities noted Neurological: Alert oriented x 4, grossly nonfocal Psychological:  Alert and cooperative. Normal mood and affect   Assessment and Recommendations:  1. Personal history of adenomatous polyps: Last colonoscopy August 2015 with multiple tubular adenomas.  Schedule for colonoscopy. The risks (including bleeding, perforation, infection, missed lesions, medication reactions and possible hospitalization or surgery if complications occur), benefits, and alternatives to colonoscopy with possible biopsy and possible polypectomy were discussed with the patient and they consent to proceed.   2.   Small bilateral inguinal hernias containing only fat.  Patient relates he is plans to schedule an appointment with Dr. Johnathan Hausen to further evaluate.  3.  Chronic anticoagulation use with Eliquis for atrial fibrillation/flutter.  Will hold Eliquis for 2 days prior to endoscopic procedures - will instruct when and how to resume after procedure. Benefits and risks of procedure explained including risks of bleeding, perforation, infection, missed lesions, reactions to medications and possible need for hospitalization and surgery for complications. Additional rare but real risk of stroke or other vascular clotting events off of Eliquis also explained and need to seek urgent help if any signs of these problems occur. Will communicate by phone or EMR with patient's prescribing provider, [name], PA-C, to confirm that holding Eliquis is reasonable in this case.

## 2020-01-14 NOTE — Patient Instructions (Signed)
You have been scheduled for a colonoscopy. Please follow written instructions given to you at your visit today.  Please pick up your prep supplies at the pharmacy within the next 1-3 days. If you use inhalers (even only as needed), please bring them with you on the day of your procedure.  Normal BMI (Body Mass Index- based on height and weight) is between 23 and 30. Your BMI today is Body mass index is 24.37 kg/m. [name] Please consider follow up  regarding your BMI with your Primary Care Provider.  Thank you for choosing me and [facility].  Pricilla Riffle. Dagoberto Ligas., MD., Marval Regal

## 2020-01-14 NOTE — Telephone Encounter (Signed)
[facility] Pre-operative Risk Assessment     Request for surgical clearance:     Endoscopy Procedure  What type of surgery is being performed?     Colonoscopy  When is this surgery scheduled?     01/28/20  What type of clearance is required ?   Pharmacy  Are there any medications that need to be held prior to surgery and how long? Eliquis x 2 days  Practice name and name of physician performing surgery?      [facility]  What is your office phone and fax number?      Phone- [phone]  [phone]  Anesthesia type (None, local, MAC, general) ?       MAC

## 2020-01-15 NOTE — Telephone Encounter (Signed)
Patient with diagnosis of afib on Eliquis for anticoagulation.    Procedure: Colonoscopy Date of procedure: 01/28/20  CHADS2-VASc score of  3 (HTN, AGE, DM2)  CrCl 102 ml/min  Per office protocol, patient can hold Eliquis for 2 days prior to procedure.

## 2020-01-15 NOTE — Telephone Encounter (Signed)
Left message for patient to return my call.

## 2020-01-15 NOTE — Telephone Encounter (Signed)
   Primary Cardiologist: Sherren Mocha, MD  Chart reviewed as part of pre-operative protocol coverage. Given past medical history and time since last visit, based on ACC/AHA guidelines, Mike Day would be at acceptable risk for the planned procedure without further cardiovascular testing.   OK to hold Eliquis two days pre op.   I will route this recommendation to the requesting party via Epic fax function and remove from pre-op pool.  Please call with questions.  Kerin Ransom, PA-C 01/15/2020, 9:40 AM

## 2020-01-17 NOTE — Telephone Encounter (Signed)
Left message for patient to return my call.

## 2020-01-18 ENCOUNTER — Encounter: Payer: Self-pay | Admitting: Family Medicine

## 2020-01-20 ENCOUNTER — Other Ambulatory Visit: Payer: Self-pay

## 2020-01-20 ENCOUNTER — Ambulatory Visit: Payer: Self-pay

## 2020-01-20 ENCOUNTER — Ambulatory Visit (INDEPENDENT_AMBULATORY_CARE_PROVIDER_SITE_OTHER): Payer: Medicare Other | Admitting: Family Medicine

## 2020-01-20 ENCOUNTER — Encounter: Payer: Self-pay | Admitting: Family Medicine

## 2020-01-20 ENCOUNTER — Ambulatory Visit (HOSPITAL_BASED_OUTPATIENT_CLINIC_OR_DEPARTMENT_OTHER)
Admission: RE | Admit: 2020-01-20 | Discharge: 2020-01-20 | Disposition: A | Discharge status: Home / Self Care | Payer: Medicare Other | Source: Ambulatory Visit | Attending: Family Medicine | Admitting: Family Medicine

## 2020-01-20 VITALS — BP 174/83 | HR 57 | Ht 75.0 in | Wt 195.0 lb

## 2020-01-20 DIAGNOSIS — S82002A Unspecified fracture of left patella, initial encounter for closed fracture: Secondary | ICD-10-CM | POA: Diagnosis not present

## 2020-01-20 DIAGNOSIS — M25562 Pain in left knee: Secondary | ICD-10-CM

## 2020-01-20 DIAGNOSIS — S8992XA Unspecified injury of left lower leg, initial encounter: Secondary | ICD-10-CM | POA: Diagnosis not present

## 2020-01-20 HISTORY — DX: Unspecified fracture of left patella, initial encounter for closed fracture: S82.002A

## 2020-01-20 NOTE — Telephone Encounter (Signed)
Left message for patient to return my call on both numbers in chart.

## 2020-01-20 NOTE — Assessment & Plan Note (Signed)
Injury occurred on 5/12.  Ultrasound was suggestive of acute fracture and x-ray suggested that as well.  Possible for sensory ossification center.  Ultrasound was demonstrating edema in this area with hyperemia. -Knee immobilizer. -Counseled on supportive care. -Follow-up in 2 weeks.  Could consider CT at that time.

## 2020-01-20 NOTE — Progress Notes (Signed)
Mike Day - 74 y.o. male MRN [id number]  Date of birth: February 27, 1946  SUBJECTIVE:  Including CC & ROS.  Chief Complaint  Patient presents with  . Knee Injury    left knee x 01/15/2020    Mike Day is a 74 y.o. male that is presenting with left knee pain.  He had a fall a few days ago onto the patella.  Since that time he has had pain with ambulation and limping.  The pain is localized to the lateral aspect of the patella.  No previous history of knee problems.   Review of Systems See HPI   HISTORY: Past Medical, Surgical, Social, and Family History Reviewed & Updated per EMR.   Pertinent Historical Findings include:  Past Medical History:  Diagnosis Date  . Anemia    Early 20's  . Atrial fibrillation ([place]) 10/07/2014   3-14 day cardiac monitor 03/2019:  AFib, Avg HR 69, no significant pauses, PVCs, wide complex runs (aberrancy vs NSVT - longest 32 beats).  . Atrial flutter [name]) 2008   Status post RFCA Dr Cristopher Peru 2008  . Chronic atrial fibrillation (HCC)    Eliquis  . Diabetes mellitus without complication ([place])    Dx by A1c criteria 2014  . Echocardiograms    Echo 12/14 - Severe basal septal hypertrophy, moderate LVH, EF 60-65%, moderate LAE, mild RAE // Echo 7/20: EF 60-65, severe asymmetric LVH, mild RVE, normal RV SF, moderate BAE, moderate MAC, moderate aortic valve calcification (noncoronary cusp), mild dilation of ascending aorta (41 mm)  . Hx of adenomatous colonic polyps   . Hyperlipidemia   . Hypertension   . Malignant neoplasm of prostate ([place]) 05/18/2012  . Prostate cancer [facility]) 2006   Prostatectomy  . Shingles 01/2019   left side of face/eye/neck/ear     Past Surgical History:  Procedure Laterality Date  . COLONOSCOPY W/ POLYPECTOMY  2003;2010;04/2014   Tubular adenoma: recall 5 yrs (after 04/2019)  . HERNIA REPAIR  07/2008   Dr.Martin  . MOHS SURGERY     left hand  . 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  RADIOFREQUENCY ABLATION  01/15/2007   for ectopic atrial foci  . SEPTOPLASTY  Age 50  . TRANSTHORACIC ECHOCARDIOGRAM  09/02/2013   Normal.  EF 60%.  LAE and RAE.    Family History  Problem Relation Age of Onset  . Lung disease Father        Black Lung  . Heart disease Father        Congenital  . Emphysema Father   . COPD Father   . Diabetes Mother   . Stroke Mother 69  . Hypertension Brother   . Heart disease Brother   . Hyperlipidemia Brother   . Heart attack Brother 27       sudden death  . Cancer Neg Hx   . Colon cancer Neg Hx   . Esophageal cancer Neg Hx     Social History   Socioeconomic History  . Marital status: Married    Spouse name: Not on file  . Number of children: 1  . Years of education: Not on file  . Highest education level: Not on file  Occupational History  . Occupation: retired  Tobacco Use  . Smoking status: Former Smoker    Quit date: 09/06/2007    Years since quitting: 12.3  . Smokeless tobacco: Never Used  . Tobacco comment: smoked 1963 -2009; total 5 packs  of cigarettes in life . Cigars < 1 / day on average 1992-2009  Substance and Sexual Activity  . Alcohol use: Yes    Alcohol/week: 7.0 standard drinks    Types: 7 Glasses of wine per week    Comment: Socially  . Drug use: No  . Sexual activity: Not Currently    Partners: Female  Other Topics Concern  . Not on file  Social History Narrative   Married, 1 son, 2 grand-daughters.   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   Alcohol: 1 glass red wine per night.   Was once a long distance runner.   Social Determinants of Health   Financial Resource Strain:   . Difficulty of Paying Living Expenses:   Food Insecurity:   . Worried About Charity fundraiser in the Last Year:   . Arboriculturist in the Last Year:   Transportation Needs:   . Film/video editor (Medical):   [name] Lack of Transportation (Non-Medical):   Physical Activity:   .  Days of Exercise per Week:   . Minutes of Exercise per Session:   Stress:   . Feeling of Stress :   Social Connections:   . Frequency of Communication with Friends and Family:   . Frequency of Social Gatherings with Friends and Family:   . Attends Religious Services:   . Active Member of Clubs or Organizations:   . Attends Archivist Meetings:   [name] Marital Status:   Intimate Partner Violence:   . Fear of Current or Ex-Partner:   . Emotionally Abused:   [name] Physically Abused:   . Sexually Abused:      PHYSICAL EXAM:  VS: BP (!) 174/83   Pulse (!) 57   Ht _0  (1.905 m)   Wt 195 lb (88.5 kg)   BMI 24.37 kg/m  Physical Exam Gen: NAD, alert, cooperative with exam, well-appearing MSK:  Left knee: Mild effusion. Limited extension and flexion. Tenderness to palpation over the lateral patella. Mild abrasion over the anterior patella. No instability with valgus or varus stress testing. Neurovascularly intact  Limited ultrasound: Left knee:  Mild to moderate effusion in the suprapatellar pouch. Mild soft tissue changes over the patella and patellar tendon. Mild degenerative changes of the lateral meniscus. Increased hyperemia over the lateral patella and an effusion in this area.  Possible for nondisplaced lateral patellar fracture.   Summary: possible nondisplaced patellar fracture   Ultrasound and interpretation by Clearance Coots, MD    ASSESSMENT & PLAN:   Closed nondisplaced fracture of left patella Injury occurred on 5/12.  Ultrasound was suggestive of acute fracture and x-ray suggested that as well.  Possible for sensory ossification center.  Ultrasound was demonstrating edema in this area with hyperemia. -Knee immobilizer. -Counseled on supportive care. -Follow-up in 2 weeks.  Could consider CT at that time.

## 2020-01-20 NOTE — Patient Instructions (Signed)
Good to see you Please use ice  Please use the brace   Please send me a message in MyChart with any questions or updates.  Please see me back in 2 weeks.   --Dr. Raeford Razor

## 2020-01-21 NOTE — Telephone Encounter (Signed)
Patient informed he can hold Eliquis 2 days prior to his procedure. Patient verbalized understanding.

## 2020-01-21 NOTE — Telephone Encounter (Signed)
Left message for patient to return my call.

## 2020-01-23 ENCOUNTER — Telehealth: Payer: Self-pay | Admitting: Gastroenterology

## 2020-01-23 NOTE — Telephone Encounter (Signed)
Dr. Fuller Plan, this pt was scheduled for a 01/28/20 colonoscopy.  He reported that he had slipped and damaged his knee cap.  He will call back to reschedule.

## 2020-01-28 ENCOUNTER — Encounter: Payer: Medicare Other | Admitting: Gastroenterology

## 2020-02-04 ENCOUNTER — Other Ambulatory Visit: Payer: Self-pay

## 2020-02-04 ENCOUNTER — Encounter: Payer: Self-pay | Admitting: Family Medicine

## 2020-02-04 ENCOUNTER — Ambulatory Visit (INDEPENDENT_AMBULATORY_CARE_PROVIDER_SITE_OTHER): Payer: Medicare Other | Admitting: Family Medicine

## 2020-02-04 ENCOUNTER — Ambulatory Visit (HOSPITAL_BASED_OUTPATIENT_CLINIC_OR_DEPARTMENT_OTHER)
Admission: RE | Admit: 2020-02-04 | Discharge: 2020-02-04 | Disposition: A | Discharge status: Home / Self Care | Payer: Medicare Other | Source: Ambulatory Visit | Attending: Family Medicine | Admitting: Family Medicine

## 2020-02-04 VITALS — HR 76 | Ht 75.0 in | Wt 200.0 lb

## 2020-02-04 DIAGNOSIS — S82002D Unspecified fracture of left patella, subsequent encounter for closed fracture with routine healing: Secondary | ICD-10-CM | POA: Diagnosis not present

## 2020-02-04 DIAGNOSIS — S82092A Other fracture of left patella, initial encounter for closed fracture: Secondary | ICD-10-CM | POA: Diagnosis not present

## 2020-02-04 NOTE — Assessment & Plan Note (Signed)
Injury occurred on 5/12.  Was placed in the knee immobilizer at last visit.  Has done well since that time. -Counseled on home exercise therapy and supportive care. -Transition to hinged knee brace. -X-ray. -Follow-up in 2 weeks.  Would reimage at that time.  Can consider physical therapy.

## 2020-02-04 NOTE — Patient Instructions (Signed)
Good to see you Please try avoid deep flexion  Please use ice as needed I will call with the results from today   Please send me a message in MyChart with any questions or updates.  Please see me back in 2 weeks.   --Dr. Raeford Razor

## 2020-02-04 NOTE — Progress Notes (Signed)
Mike Day - 74 y.o. male MRN [id number]  Date of birth: [birth year]  SUBJECTIVE:  Including CC & ROS.  Chief Complaint  Patient presents with  . Follow-up    left knee    Mike Day is a 74 y.o. male that is following up for his left knee pain.  Previously it appeared that he had a patellar fracture from a trauma landing on the knee.  He was placed in the knee immobilizer 2 weeks ago.  Since that time his pain and swelling improved.  Denies any numbness or tingling.   Review of Systems See HPI   HISTORY: Past Medical, Surgical, Social, and Family History Reviewed & Updated per EMR.   Pertinent Historical Findings include:  Past Medical History:  Diagnosis Date  . Anemia    Early 20's  . Atrial fibrillation ([place]) 10/07/2014   3-14 day cardiac monitor 03/2019:  AFib, Avg HR 69, no significant pauses, PVCs, wide complex runs (aberrancy vs NSVT - longest 32 beats).  . Atrial flutter [facility]) 2008   Status post RFCA Dr Cristopher [place] 2008  . Chronic atrial fibrillation (HCC)    Eliquis  . Diabetes mellitus without complication ([place])    Dx by A1c criteria 2014  . Echocardiograms    Echo 12/14 - Severe basal septal hypertrophy, moderate LVH, EF 60-65%, moderate LAE, mild RAE // Echo 7/20: EF 60-65, severe asymmetric LVH, mild RVE, normal RV SF, moderate BAE, moderate MAC, moderate aortic valve calcification (noncoronary cusp), mild dilation of ascending aorta (41 mm)  . Hx of adenomatous colonic polyps   . Hyperlipidemia   . Hypertension   . Malignant neoplasm of prostate ([place]) 05/18/2012  . Prostate cancer [place]) 2006   Prostatectomy  . Shingles 01/2019   left side of face/eye/neck/ear     Past Surgical History:  Procedure Laterality Date  . COLONOSCOPY W/ POLYPECTOMY  2003;2010;04/2014   Tubular adenoma: recall 5 yrs (after 04/2019)  . HERNIA REPAIR  07/2008   Dr.Martin  . MOHS SURGERY     left hand  . 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  RADIOFREQUENCY ABLATION  01/15/2007   for ectopic atrial foci  . SEPTOPLASTY  Age 51  . TRANSTHORACIC ECHOCARDIOGRAM  09/02/2013   Normal.  EF 60%.  LAE and RAE.    Family History  Problem Relation Age of Onset  . Lung disease Father        Black Lung  . Heart disease Father        Congenital  . Emphysema Father   . COPD Father   . Diabetes Mother   . Stroke Mother 28  . Hypertension Brother   . Heart disease Brother   . Hyperlipidemia Brother   . Heart attack Brother 73       sudden death  . Cancer Neg Hx   . Colon cancer Neg Hx   . Esophageal cancer Neg Hx     Social History   Socioeconomic History  . Marital status: Married    Spouse name: Not on file  . Number of children: 1  . Years of education: Not on file  . Highest education level: Not on file  Occupational History  . Occupation: retired  Tobacco Use  . Smoking status: Former Smoker    Quit date: 09/06/2007    Years since quitting: 12.4  . Smokeless tobacco: Never Used  . Tobacco comment: smoked 1963 -2009; total 5 packs of  cigarettes in life . Cigars < 1 / day on average 1992-2009  Substance and Sexual Activity  . Alcohol use: Yes    Alcohol/week: 7.0 standard drinks    Types: 7 Glasses of wine per week    Comment: Socially  . Drug use: No  . Sexual activity: Not Currently    Partners: Female  Other Topics Concern  . Not on file  Social History Narrative   Married, 1 son, 2 grand-daughters.   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   Alcohol: 1 glass red wine per night.   Was once a long distance runner.   Social Determinants of Health   Financial Resource Strain:   . Difficulty of Paying Living Expenses:   Food Insecurity:   . Worried About Charity fundraiser in the Last Year:   . Arboriculturist in the Last Year:   Transportation Needs:   . Film/video editor (Medical):   [name] Lack of Transportation (Non-Medical):   Physical Activity:   .  Days of Exercise per Week:   . Minutes of Exercise per Session:   Stress:   . Feeling of Stress :   Social Connections:   . Frequency of Communication with Friends and Family:   . Frequency of Social Gatherings with Friends and Family:   . Attends Religious Services:   . Active Member of Clubs or Organizations:   . Attends Archivist Meetings:   [name] Marital Status:   Intimate Partner Violence:   . Fear of Current or Ex-Partner:   . Emotionally Abused:   [name] Physically Abused:   . Sexually Abused:      PHYSICAL EXAM:  VS: Pulse 76   Ht _0  (1.905 m)   Wt 200 lb (90.7 kg)   BMI 25.00 kg/m  Physical Exam Gen: NAD, alert, cooperative with exam, well-appearing MSK:  Left knee: No obvious effusion. Some tenderness palpation lateral aspect of patella. No ecchymosis or swelling. Extensor mechanism is intact. Neurovascularly intact     ASSESSMENT & PLAN:   Closed nondisplaced fracture of left patella Injury occurred on 5/12.  Was placed in the knee immobilizer at last visit.  Has done well since that time. -Counseled on home exercise therapy and supportive care. -Transition to hinged knee brace. -X-ray. -Follow-up in 2 weeks.  Would reimage at that time.  Can consider physical therapy.

## 2020-02-05 ENCOUNTER — Telehealth: Payer: Self-pay | Admitting: Family Medicine

## 2020-02-05 NOTE — Telephone Encounter (Signed)
Left VM for patient. If he calls back please have him speak with a nurse/CMA and inform that his fracture is stable. We will continue with scheduled follow up and minimizing flexion of the knee. Continue the brace.   If any questions then please take the best time and phone number to call and I will try to call him back.   Rosemarie Ax, MD [facility] 02/05/2020, 8:18 AM

## 2020-02-06 ENCOUNTER — Encounter: Payer: Self-pay | Admitting: Family Medicine

## 2020-02-18 ENCOUNTER — Ambulatory Visit (INDEPENDENT_AMBULATORY_CARE_PROVIDER_SITE_OTHER): Payer: Medicare Other | Admitting: Family Medicine

## 2020-02-18 ENCOUNTER — Ambulatory Visit (HOSPITAL_BASED_OUTPATIENT_CLINIC_OR_DEPARTMENT_OTHER)
Admission: RE | Admit: 2020-02-18 | Discharge: 2020-02-18 | Disposition: A | Discharge status: Home / Self Care | Payer: Medicare Other | Source: Ambulatory Visit | Attending: Family Medicine | Admitting: Family Medicine

## 2020-02-18 ENCOUNTER — Encounter: Payer: Self-pay | Admitting: Family Medicine

## 2020-02-18 ENCOUNTER — Other Ambulatory Visit: Payer: Self-pay

## 2020-02-18 VITALS — BP 151/103 | HR 77 | Ht 75.0 in | Wt 200.0 lb

## 2020-02-18 DIAGNOSIS — S82002A Unspecified fracture of left patella, initial encounter for closed fracture: Secondary | ICD-10-CM | POA: Diagnosis not present

## 2020-02-18 DIAGNOSIS — S82002D Unspecified fracture of left patella, subsequent encounter for closed fracture with routine healing: Secondary | ICD-10-CM | POA: Diagnosis not present

## 2020-02-18 NOTE — Progress Notes (Signed)
LEANDRO BERKOWITZ - 74 y.o. male MRN [id number]  Date of birth: [birth year]  SUBJECTIVE:  Including CC & ROS.  Chief Complaint  Patient presents with  . Follow-up    left knee    JOFFRE LUCKS is a 74 y.o. male that is following up for his left patella fracture.  He has been doing well.  Swelling has been occurring slightly over the patella.  He does have good extension.  Denies any significant pain.  Denies any bruising.   Review of Systems See HPI   HISTORY: Past Medical, Surgical, Social, and Family History Reviewed & Updated per EMR.   Pertinent Historical Findings include:  Past Medical History:  Diagnosis Date  . Anemia    Early 20's  . Atrial fibrillation ([place]) 10/07/2014   3-14 day cardiac monitor 03/2019:  AFib, Avg HR 69, no significant pauses, PVCs, wide complex runs (aberrancy vs NSVT - longest 32 beats).  . Atrial flutter [facility]) 2008   Status post RFCA Dr Cristopher [place] 2008  . Chronic atrial fibrillation (HCC)    Eliquis  . Diabetes mellitus without complication ([place])    Dx by A1c criteria 2014  . Echocardiograms    Echo 12/14 - Severe basal septal hypertrophy, moderate LVH, EF 60-65%, moderate LAE, mild RAE // Echo 7/20: EF 60-65, severe asymmetric LVH, mild RVE, normal RV SF, moderate BAE, moderate MAC, moderate aortic valve calcification (noncoronary cusp), mild dilation of ascending aorta (41 mm)  . Hx of adenomatous colonic polyps   . Hyperlipidemia   . Hypertension   . Malignant neoplasm of prostate ([place]) 05/18/2012  . Prostate cancer [facility]) 2006   Prostatectomy  . Shingles 01/2019   left side of face/eye/neck/ear     Past Surgical History:  Procedure Laterality Date  . COLONOSCOPY W/ POLYPECTOMY  2003;2010;04/2014   Tubular adenoma: recall 5 yrs (after 04/2019)  . HERNIA REPAIR  07/2008   Dr.Martin  . MOHS SURGERY     left hand  . 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 RADIOFREQUENCY ABLATION  01/15/2007   for ectopic atrial foci    . SEPTOPLASTY  Age 24  . TRANSTHORACIC ECHOCARDIOGRAM  09/02/2013   Normal.  EF 60%.  LAE and RAE.    Family History  Problem Relation Age of Onset  . Lung disease Father        Black Lung  . Heart disease Father        Congenital  . Emphysema Father   . COPD Father   . Diabetes Mother   . Stroke Mother 53  . Hypertension Brother   . Heart disease Brother   . Hyperlipidemia Brother   . Heart attack Brother 86       sudden death  . Cancer Neg Hx   . Colon cancer Neg Hx   . Esophageal cancer Neg Hx     Social History   Socioeconomic History  . Marital status: Married    Spouse name: Not on file  . Number of children: 1  . Years of education: Not on file  . Highest education level: Not on file  Occupational History  . Occupation: retired  Tobacco Use  . Smoking status: Former Smoker    Quit date: 09/06/2007    Years since quitting: 12.4  . Smokeless tobacco: Never Used  . Tobacco comment: smoked 1963 -2009; total 5 packs of cigarettes in life . Cigars < 1 / day on average 1992-2009  Vaping Use  . Vaping Use: Never used  Substance and Sexual Activity  . Alcohol use: Yes    Alcohol/week: 7.0 standard drinks    Types: 7 Glasses of wine per week    Comment: Socially  . Drug use: No  . Sexual activity: Not Currently    Partners: Female  Other Topics Concern  . Not on file  Social History Narrative   Married, 1 son, 2 grand-daughters.   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   Alcohol: 1 glass red wine per night.   Was once a long distance runner.   Social Determinants of Health   Financial Resource Strain:   . Difficulty of Paying Living Expenses:   Food Insecurity:   . Worried About Charity fundraiser in the Last Year:   . Arboriculturist in the Last Year:   Transportation Needs:   . Film/video editor (Medical):   [name] Lack of Transportation (Non-Medical):   Physical Activity:   . Days of Exercise per  Week:   . Minutes of Exercise per Session:   Stress:   . Feeling of Stress :   Social Connections:   . Frequency of Communication with Friends and Family:   . Frequency of Social Gatherings with Friends and Family:   . Attends Religious Services:   . Active Member of Clubs or Organizations:   . Attends Archivist Meetings:   [name] Marital Status:   Intimate Partner Violence:   . Fear of Current or Ex-Partner:   . Emotionally Abused:   [name] Physically Abused:   . Sexually Abused:      PHYSICAL EXAM:  VS: BP (!) 151/103   Pulse 77   Ht _0  (1.905 m)   Wt 200 lb (90.7 kg)   BMI 25.00 kg/m  Physical Exam Gen: NAD, alert, cooperative with exam, well-appearing MSK:  Left knee: Improvement of extensor mechanism. Normal flexion. No tenderness palpation over the patella. Some swelling over the patella. No ecchymosis. Neurovascularly intact     ASSESSMENT & PLAN:   Closed nondisplaced fracture of left patella Initial injury on 5/12.  Has been doing well.  No pain today.  Extensor mechanism is getting back to normal. -Counseled on home exercise therapy and supportive care. -X-ray. -Continue bracing for a few more weeks. -Can see back in either 2 to 4 weeks.

## 2020-02-18 NOTE — Assessment & Plan Note (Signed)
Initial injury on 5/12.  Has been doing well.  No pain today.  Extensor mechanism is getting back to normal. -Counseled on home exercise therapy and supportive care. -X-ray. -Continue bracing for a few more weeks. -Can see back in either 2 to 4 weeks.

## 2020-02-19 ENCOUNTER — Telehealth: Payer: Self-pay | Admitting: Family Medicine

## 2020-02-19 NOTE — Telephone Encounter (Signed)
Left VM for patient. If he calls back please have him speak with a nurse/CMA and inform that his xray is unchanged from his most recent xray. I would do gradual range of motion. I would continue the brace. We'll see him back in 4 weeks.   If any questions then please take the best time and phone number to call and I will try to call him back.   Rosemarie Ax, MD [facility] 02/19/2020, 1:31 PM

## 2020-02-19 NOTE — Telephone Encounter (Signed)
Patient returning call for xray results  

## 2020-02-19 NOTE — Telephone Encounter (Signed)
Spoke to patient and gave result information as provided by the physician. 

## 2020-03-10 ENCOUNTER — Encounter: Payer: Self-pay | Admitting: Gastroenterology

## 2020-03-12 ENCOUNTER — Other Ambulatory Visit: Payer: Self-pay | Admitting: Cardiovascular Disease

## 2020-03-17 ENCOUNTER — Ambulatory Visit: Payer: Medicare Other | Admitting: Family Medicine

## 2020-03-17 DIAGNOSIS — H0011 Chalazion right upper eyelid: Secondary | ICD-10-CM | POA: Diagnosis not present

## 2020-03-18 ENCOUNTER — Ambulatory Visit (INDEPENDENT_AMBULATORY_CARE_PROVIDER_SITE_OTHER): Payer: Medicare Other | Admitting: Family Medicine

## 2020-03-18 ENCOUNTER — Encounter: Payer: Self-pay | Admitting: Family Medicine

## 2020-03-18 ENCOUNTER — Other Ambulatory Visit: Payer: Self-pay

## 2020-03-18 ENCOUNTER — Ambulatory Visit (HOSPITAL_BASED_OUTPATIENT_CLINIC_OR_DEPARTMENT_OTHER)
Admission: RE | Admit: 2020-03-18 | Discharge: 2020-03-18 | Disposition: A | Discharge status: Home / Self Care | Payer: Medicare Other | Source: Ambulatory Visit | Attending: Family Medicine | Admitting: Family Medicine

## 2020-03-18 VITALS — BP 166/105 | HR 68 | Ht 75.0 in | Wt 195.0 lb

## 2020-03-18 DIAGNOSIS — S82002D Unspecified fracture of left patella, subsequent encounter for closed fracture with routine healing: Secondary | ICD-10-CM | POA: Insufficient documentation

## 2020-03-18 DIAGNOSIS — S82002A Unspecified fracture of left patella, initial encounter for closed fracture: Secondary | ICD-10-CM | POA: Diagnosis not present

## 2020-03-18 NOTE — Progress Notes (Signed)
Mike Day - 74 y.o. male MRN [id number]  Date of birth: [birth year]  SUBJECTIVE:  Including CC & ROS.  Chief Complaint  Patient presents with  . Follow-up    left knee    Mike Day is a 74 y.o. male that is following up for his left knee patella fracture.  Is doing well with no pain.  Has continued knee brace.   Review of Systems See HPI   HISTORY: Past Medical, Surgical, Social, and Family History Reviewed & Updated per EMR.   Pertinent Historical Findings include:  Past Medical History:  Diagnosis Date  . Anemia    Early 20's  . Atrial fibrillation ([place]) 10/07/2014   3-14 day cardiac monitor 03/2019:  AFib, Avg HR 69, no significant pauses, PVCs, wide complex runs (aberrancy vs NSVT - longest 32 beats).  . Atrial flutter [facility]) 2008   Status post RFCA Dr Cristopher [place] 2008  . Chronic atrial fibrillation (HCC)    Eliquis  . Diabetes mellitus without complication ([place])    Dx by A1c criteria 2014  . Echocardiograms    Echo 12/14 - Severe basal septal hypertrophy, moderate LVH, EF 60-65%, moderate LAE, mild RAE // Echo 7/20: EF 60-65, severe asymmetric LVH, mild RVE, normal RV SF, moderate BAE, moderate MAC, moderate aortic valve calcification (noncoronary cusp), mild dilation of ascending aorta (41 mm)  . Hx of adenomatous colonic polyps   . Hyperlipidemia   . Hypertension   . Malignant neoplasm of prostate ([place]) 05/18/2012  . Prostate cancer [facility]) 2006   Prostatectomy  . Shingles 01/2019   left side of face/eye/neck/ear     Past Surgical History:  Procedure Laterality Date  . COLONOSCOPY W/ POLYPECTOMY  2003;2010;04/2014   Tubular adenoma: recall 5 yrs (after 04/2019)  . HERNIA REPAIR  07/2008   Dr.Martin  . MOHS SURGERY     left hand  . 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 RADIOFREQUENCY ABLATION  01/15/2007   for ectopic atrial foci  . SEPTOPLASTY  Age 7  . TRANSTHORACIC ECHOCARDIOGRAM  09/02/2013   Normal.  EF 60%.  LAE and RAE.      Family History  Problem Relation Age of Onset  . Lung disease Father        Black Lung  . Heart disease Father        Congenital  . Emphysema Father   . COPD Father   . Diabetes Mother   . Stroke Mother 35  . Hypertension Brother   . Heart disease Brother   . Hyperlipidemia Brother   . Heart attack Brother 80       sudden death  . Cancer Neg Hx   . Colon cancer Neg Hx   . Esophageal cancer Neg Hx     Social History   Socioeconomic History  . Marital status: Married    Spouse name: Not on file  . Number of children: 1  . Years of education: Not on file  . Highest education level: Not on file  Occupational History  . Occupation: retired  Tobacco Use  . Smoking status: Former Smoker    Quit date: 09/06/2007    Years since quitting: 12.5  . Smokeless tobacco: Never Used  . Tobacco comment: smoked 1963 -2009; total 5 packs of cigarettes in life . Cigars < 1 / day on average 1992-2009  Vaping Use  . Vaping Use: Never used  Substance and Sexual Activity  . Alcohol  use: Yes    Alcohol/week: 7.0 standard drinks    Types: 7 Glasses of wine per week    Comment: Socially  . Drug use: No  . Sexual activity: Not Currently    Partners: Female  Other Topics Concern  . Not on file  Social History Narrative   Married, 1 son, 2 grand-daughters.   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   Alcohol: 1 glass red wine per night.   Was once a long distance runner.   Social Determinants of Health   Financial Resource Strain:   . Difficulty of Paying Living Expenses:   Food Insecurity:   . Worried About Charity fundraiser in the Last Year:   . Arboriculturist in the Last Year:   Transportation Needs:   . Film/video editor (Medical):   [name] Lack of Transportation (Non-Medical):   Physical Activity:   . Days of Exercise per Week:   . Minutes of Exercise per Session:   Stress:   . Feeling of Stress :   Social Connections:    . Frequency of Communication with Friends and Family:   . Frequency of Social Gatherings with Friends and Family:   . Attends Religious Services:   . Active Member of Clubs or Organizations:   . Attends Archivist Meetings:   [name] Marital Status:   Intimate Partner Violence:   . Fear of Current or Ex-Partner:   . Emotionally Abused:   [name] Physically Abused:   . Sexually Abused:      PHYSICAL EXAM:  VS: BP (!) 166/105   Pulse 68   Ht _0  (1.905 m)   Wt 195 lb (88.5 kg)   BMI 24.37 kg/m  Physical Exam Gen: NAD, alert, cooperative with exam, well-appearing MSK:  Left knee: No effusion. Normal range of motion. Mild effusion of the anterior patella. Neurovascular intact     ASSESSMENT & PLAN:   Closed nondisplaced fracture of left patella Initial injury on 5/12.  No pain today.  Does have callus formation over the anterior aspect of the patella. -Counseled on home exercise therapy and supportive care. -X-ray. -Could consider physical therapy.

## 2020-03-18 NOTE — Assessment & Plan Note (Signed)
Initial injury on 5/12.  No pain today.  Does have callus formation over the anterior aspect of the patella. -Counseled on home exercise therapy and supportive care. -X-ray. -Could consider physical therapy.

## 2020-03-24 ENCOUNTER — Ambulatory Visit (HOSPITAL_COMMUNITY): Payer: Medicare Other

## 2020-03-30 ENCOUNTER — Telehealth: Payer: Self-pay | Admitting: Family Medicine

## 2020-03-30 NOTE — Telephone Encounter (Signed)
Left VM for patient. If he calls back please have him speak with a nurse/CMA and inform that his fracture is stable. We let pain be his guide to becoming more active. We can follow up as needed.   If any questions then please take the best time and phone number to call and I will try to call him back.   Rosemarie Ax, MD [facility] 03/30/2020, 8:32 AM

## 2020-04-13 ENCOUNTER — Other Ambulatory Visit: Payer: Self-pay

## 2020-04-13 ENCOUNTER — Ambulatory Visit (HOSPITAL_COMMUNITY)
Admission: RE | Admit: 2020-04-13 | Discharge: 2020-04-13 | Disposition: A | Discharge status: Home / Self Care | Payer: Medicare Other | Source: Ambulatory Visit | Attending: Physician Assistant | Admitting: Physician Assistant

## 2020-04-13 DIAGNOSIS — R42 Dizziness and giddiness: Secondary | ICD-10-CM | POA: Diagnosis not present

## 2020-04-13 DIAGNOSIS — I679 Cerebrovascular disease, unspecified: Secondary | ICD-10-CM | POA: Diagnosis not present

## 2020-04-15 ENCOUNTER — Encounter: Payer: Self-pay | Admitting: Physician Assistant

## 2020-04-20 DIAGNOSIS — H0011 Chalazion right upper eyelid: Secondary | ICD-10-CM | POA: Diagnosis not present

## 2020-04-20 DIAGNOSIS — H40023 Open angle with borderline findings, high risk, bilateral: Secondary | ICD-10-CM | POA: Diagnosis not present

## 2020-04-20 DIAGNOSIS — H43811 Vitreous degeneration, right eye: Secondary | ICD-10-CM | POA: Diagnosis not present

## 2020-04-20 DIAGNOSIS — H35033 Hypertensive retinopathy, bilateral: Secondary | ICD-10-CM | POA: Diagnosis not present

## 2020-04-20 DIAGNOSIS — H25813 Combined forms of age-related cataract, bilateral: Secondary | ICD-10-CM | POA: Diagnosis not present

## 2020-04-29 ENCOUNTER — Other Ambulatory Visit: Payer: Self-pay

## 2020-04-29 ENCOUNTER — Ambulatory Visit (AMBULATORY_SURGERY_CENTER): Payer: Self-pay | Admitting: *Deleted

## 2020-04-29 VITALS — Ht 75.0 in | Wt 199.0 lb

## 2020-04-29 DIAGNOSIS — Z86010 Personal history of colonic polyps: Secondary | ICD-10-CM

## 2020-04-29 NOTE — Progress Notes (Signed)
Patient is here in-person for PV. Patient denies any allergies to eggs or soy. Patient denies any problems with anesthesia/sedation. Patient denies any oxygen use at home. Patient denies taking any diet/weight loss medications. Pt  Is on blood thinner-eliquis. Patient is not being treated for MRSA or C-diff. Patient is aware of our care-partner policy and [id number] safety protocol. Completed covid vaccines per pt. Patient has suprep at home. Patient denies any medical changes since GI OV.

## 2020-05-13 ENCOUNTER — Other Ambulatory Visit: Payer: Self-pay

## 2020-05-13 ENCOUNTER — Encounter: Payer: Self-pay | Admitting: Gastroenterology

## 2020-05-13 ENCOUNTER — Ambulatory Visit (AMBULATORY_SURGERY_CENTER): Payer: Medicare Other | Admitting: Gastroenterology

## 2020-05-13 VITALS — BP 117/80 | HR 62 | Temp 97.3°F | Resp 9 | Ht 75.0 in | Wt 199.0 lb

## 2020-05-13 DIAGNOSIS — Z86010 Personal history of colonic polyps: Secondary | ICD-10-CM | POA: Diagnosis not present

## 2020-05-13 DIAGNOSIS — K635 Polyp of colon: Secondary | ICD-10-CM | POA: Diagnosis not present

## 2020-05-13 DIAGNOSIS — I10 Essential (primary) hypertension: Secondary | ICD-10-CM | POA: Diagnosis not present

## 2020-05-13 DIAGNOSIS — D120 Benign neoplasm of cecum: Secondary | ICD-10-CM | POA: Diagnosis not present

## 2020-05-13 DIAGNOSIS — E785 Hyperlipidemia, unspecified: Secondary | ICD-10-CM | POA: Diagnosis not present

## 2020-05-13 MED ORDER — SODIUM CHLORIDE 0.9 % IV SOLN
500.0000 mL | Freq: Once | INTRAVENOUS | Status: DC
Start: 2020-05-13 — End: 2020-05-13

## 2020-05-13 NOTE — Progress Notes (Signed)
Report to PACU, RN, vss, BBS= Clear.  

## 2020-05-13 NOTE — Patient Instructions (Signed)
HANDOUTS PROVIDED ON: Polyps, DIverticulosis, High Fiber diet and Hemorrhoids  The polyps removed today have been sent for pathology.  The results can take 1-3 weeks to receive.  When your next colonoscopy should occur will be based on the pathology results.    You may resume your previous diet and medication schedule. Resume Eliquis in 2 days at prior dose.  Refer to managing physician for further adjustment of therapy  Thank you for allowing [place] to care for you today!!!   YOU HAD AN ENDOSCOPIC PROCEDURE TODAY AT [facility]:   Refer to the procedure report that was given to you for any specific questions about what was found during the examination.  If the procedure report does not answer your questions, please call your gastroenterologist to clarify.  If you requested that your care partner not be given the details of your procedure findings, then the procedure report has been included in a sealed envelope for you to review at your convenience later.  YOU SHOULD EXPECT: Some feelings of bloating in the abdomen. Passage of more gas than usual.  Walking can help get rid of the air that was put into your GI tract during the procedure and reduce the bloating. If you had a lower endoscopy (such as a colonoscopy or flexible sigmoidoscopy) you may notice spotting of blood in your stool or on the toilet paper. If you underwent a bowel prep for your procedure, you may not have a normal bowel movement for a few days.  Please Note:  You might notice some irritation and congestion in your nose or some drainage.  This is from the oxygen used during your procedure.  There is no need for concern and it should clear up in a day or so.  SYMPTOMS TO REPORT IMMEDIATELY:   Following lower endoscopy (colonoscopy or flexible sigmoidoscopy):  Excessive amounts of blood in the stool  Significant tenderness or worsening of abdominal pains  Swelling of the abdomen that is new, acute  Fever of 100F or  higher    For urgent or emergent issues, a gastroenterologist can be reached at any hour by calling [phone]. Do not use MyChart messaging for urgent concerns.    DIET:  We do recommend a small meal at first, but then you may proceed to your regular diet.  Drink plenty of fluids but you should avoid alcoholic beverages for 24 hours.  ACTIVITY:  You should plan to take it easy for the rest of today and you should NOT DRIVE or use heavy machinery until tomorrow (because of the sedation medicines used during the test).    FOLLOW UP: Our staff will call the number listed on your records 48-72 hours following your procedure to check on you and address any questions or concerns that you may have regarding the information given to you following your procedure. If we do not reach you, we will leave a message.  We will attempt to reach you two times.  During this call, we will ask if you have developed any symptoms of COVID 19. If you develop any symptoms (ie: fever, flu-like symptoms, shortness of breath, cough etc.) before then, please call [phone].  If you test positive for Covid 19 in the 2 weeks post procedure, please call and report this information to [place].    If any biopsies were taken you will be contacted by phone or by letter within the next 1-3 weeks.  Please call us at [phone] if you  have not heard about the biopsies in 3 weeks.    SIGNATURES/CONFIDENTIALITY: You and/or your care partner have signed paperwork which will be entered into your electronic medical record.  These signatures attest to the fact that that the information above on your After Visit Summary has been reviewed and is understood.  Full responsibility of the confidentiality of this discharge information lies with you and/or your care-partner.

## 2020-05-13 NOTE — Progress Notes (Signed)
VS-CW  Pt's states no medical or surgical changes since previsit or office visit.  

## 2020-05-13 NOTE — Op Note (Signed)
[place] Patient Name: Mike Day Procedure Date: 05/13/2020 9:51 AM MRN: [id number] Endoscopist: Ladene Artist , MD Age: 74 Referring MD:  Date of Birth: 1946/06/09 Gender: Male Account #: [id number] Procedure:                Colonoscopy Indications:              Surveillance: Personal history of adenomatous                            polyps on last colonoscopy > 5 years ago Medicines:                Monitored Anesthesia Care Procedure:                Pre-Anesthesia Assessment:                           - Prior to the procedure, a History and Physical                            was performed, and patient medications and                            allergies were reviewed. The patient's tolerance of                            previous anesthesia was also reviewed. The risks                            and benefits of the procedure and the sedation                            options and risks were discussed with the patient.                            All questions were answered, and informed consent                            was obtained. Prior Anticoagulants: The patient has                            taken Eliquis (apixaban), last dose was 2 days                            prior to procedure. ASA Grade Assessment: II - A                            patient with mild systemic disease. After reviewing                            the risks and benefits, the patient was deemed in                            satisfactory condition to undergo the procedure.  After obtaining informed consent, the colonoscope                            was passed under direct vision. Throughout the                            procedure, the patient's blood pressure, pulse, and                            oxygen saturations were monitored continuously. The                            Colonoscope was introduced through the anus and                            advanced to the  the cecum, identified by                            appendiceal orifice and ileocecal valve. The                            ileocecal valve, appendiceal orifice, and rectum                            were photographed. The quality of the bowel                            preparation was adequate after extensive lavage and                            suction. The colonoscopy was performed without                            difficulty. The patient tolerated the procedure                            well. Scope In: 10:04:43 AM Scope Out: 10:21:25 AM Scope Withdrawal Time: 0 hours 12 minutes 11 seconds  Total Procedure Duration: 0 hours 16 minutes 42 seconds  Findings:                 The perianal and digital rectal examinations were                            normal.                           A 4 mm polyp was found in the ileocecal valve. The                            polyp was sessile. The polyp was removed with a                            cold snare. Resection and retrieval were complete.  Scattered medium-mouthed diverticula were found in                            the right colon. There was evidence of an impacted                            diverticulum. There was no evidence of diverticular                            bleeding.                           Multiple medium-mouthed diverticula were found in                            the left colon. There was evidence of diverticular                            spasm. There was no evidence of diverticular                            bleeding.                           Internal hemorrhoids were found during                            retroflexion. The hemorrhoids were small and Grade                            I (internal hemorrhoids that do not prolapse).                           The exam was otherwise without abnormality on                            direct and retroflexion views. Complications:            No  immediate complications. Estimated blood loss:                            None. Estimated Blood Loss:     Estimated blood loss: none. Impression:               - One 4 mm polyp at the ileocecal valve, removed                            with a cold snare. Resected and retrieved.                           - Mild diverticulosis in the right colon.                           - Moderate diverticulosis in the left colon.                           -  Internal hemorrhoids.                           - The examination was otherwise normal on direct                            and retroflexion views. Recommendation:           - Resume Eliquis (apixaban) in 2 days at prior                            dose. Refer to managing physician for further                            adjustment of therapy.                           - Patient has a contact number available for                            emergencies. The signs and symptoms of potential                            delayed complications were discussed with the                            patient. Return to normal activities tomorrow.                            Written discharge instructions were provided to the                            patient.                           - High fiber diet.                           - Continue present medications.                           - Await pathology results.                           - No repeat colonoscopy due to age. Ladene Artist, MD 05/13/2020 10:25:30 AM This report has been signed electronically.

## 2020-05-13 NOTE — Progress Notes (Signed)
Dr Fuller Plan aware of Robin[place]

## 2020-05-13 NOTE — Progress Notes (Signed)
Called to room to assist during endoscopic procedure.  Patient ID and intended procedure confirmed with present staff. Received instructions for my participation in the procedure from the performing physician.  

## 2020-05-15 ENCOUNTER — Telehealth: Payer: Self-pay | Admitting: *Deleted

## 2020-05-15 NOTE — Telephone Encounter (Signed)
1. Have you developed a fever since your procedure? no  2.   Have you had an respiratory symptoms (SOB or cough) since your procedure? no  3.   Have you tested positive for COVID 19 since your procedure no  4.   Have you had any family members/close contacts diagnosed with the COVID 19 since your procedure?  no   If yes to any of these questions please route to Joylene John, RN and Joella Prince, RN Follow up Call-  Call back number 05/13/2020  Post procedure Call Back phone  # [phone]  Permission to leave phone message Yes  Some recent data might be hidden     Patient questions:  Do you have a fever, pain , or abdominal swelling? No. Pain Score  0 *  Have you tolerated food without any problems? Yes.    Have you been able to return to your normal activities? Yes.    Do you have any questions about your discharge instructions: Diet   No. Medications  No. Follow up visit  No.  Do you have questions or concerns about your Care? No.  Actions: * If pain score is 4 or above: No action needed, pain <4.

## 2020-05-20 ENCOUNTER — Other Ambulatory Visit: Payer: Self-pay

## 2020-05-20 ENCOUNTER — Ambulatory Visit (INDEPENDENT_AMBULATORY_CARE_PROVIDER_SITE_OTHER): Payer: Medicare Other | Admitting: Family Medicine

## 2020-05-20 ENCOUNTER — Encounter: Payer: Self-pay | Admitting: Family Medicine

## 2020-05-20 DIAGNOSIS — Z23 Encounter for immunization: Secondary | ICD-10-CM

## 2020-05-25 ENCOUNTER — Encounter: Payer: Self-pay | Admitting: Gastroenterology

## 2020-05-28 ENCOUNTER — Other Ambulatory Visit: Payer: Self-pay | Admitting: Cardiovascular Disease

## 2020-05-28 NOTE — Telephone Encounter (Signed)
Pt last saw [name], PA on 11/26/19, last labs 12/16/19 Creat 0.77, age 74, weight 90.3kg, based on specified criteria pt is on appropriate dosage of Eliquis 5mg  BID.  Will refill rx.

## 2020-07-06 ENCOUNTER — Other Ambulatory Visit (HOSPITAL_BASED_OUTPATIENT_CLINIC_OR_DEPARTMENT_OTHER): Payer: Self-pay | Admitting: Internal Medicine

## 2020-07-06 ENCOUNTER — Ambulatory Visit: Payer: Medicare Other | Attending: Internal Medicine

## 2020-07-06 DIAGNOSIS — Z23 Encounter for immunization: Secondary | ICD-10-CM

## 2020-07-06 NOTE — Progress Notes (Signed)
   Covid-19 Vaccination Clinic  Name:  JAWARA LATORRE    MRN: [id number] DOB: 05-18-1946  07/06/2020  Mr. Corcoran was observed post Covid-19 immunization for 15 minutes without incident. He was provided with Vaccine Information Sheet and instruction to access the V-Safe system.   Mr. Micheletti was instructed to call 911 with any severe reactions post vaccine: [name] Difficulty breathing  . Swelling of face and throat  . A fast heartbeat  . A bad rash all over body  . Dizziness and weakness

## 2020-07-15 MED FILL — PFIZER-BIONTECH COVID-19 VA: 30 | 1 days supply | Qty: 0 | Fill #0

## 2020-07-21 DIAGNOSIS — H0102B Squamous blepharitis left eye, upper and lower eyelids: Secondary | ICD-10-CM | POA: Diagnosis not present

## 2020-07-21 DIAGNOSIS — H40023 Open angle with borderline findings, high risk, bilateral: Secondary | ICD-10-CM | POA: Diagnosis not present

## 2020-07-21 DIAGNOSIS — H0011 Chalazion right upper eyelid: Secondary | ICD-10-CM | POA: Diagnosis not present

## 2020-07-21 DIAGNOSIS — L909 Atrophic disorder of skin, unspecified: Secondary | ICD-10-CM | POA: Diagnosis not present

## 2020-07-21 DIAGNOSIS — H0102A Squamous blepharitis right eye, upper and lower eyelids: Secondary | ICD-10-CM | POA: Diagnosis not present

## 2020-07-21 DIAGNOSIS — H35033 Hypertensive retinopathy, bilateral: Secondary | ICD-10-CM | POA: Diagnosis not present

## 2020-07-21 DIAGNOSIS — H43811 Vitreous degeneration, right eye: Secondary | ICD-10-CM | POA: Diagnosis not present

## 2020-07-21 DIAGNOSIS — H25813 Combined forms of age-related cataract, bilateral: Secondary | ICD-10-CM | POA: Diagnosis not present

## 2020-07-25 ENCOUNTER — Other Ambulatory Visit: Payer: Self-pay | Admitting: Cardiovascular Disease

## 2020-07-27 NOTE — Telephone Encounter (Signed)
rx refill

## 2020-07-28 DIAGNOSIS — N393 Stress incontinence (female) (male): Secondary | ICD-10-CM | POA: Diagnosis not present

## 2020-07-28 DIAGNOSIS — R350 Frequency of micturition: Secondary | ICD-10-CM | POA: Diagnosis not present

## 2020-08-06 ENCOUNTER — Telehealth (INDEPENDENT_AMBULATORY_CARE_PROVIDER_SITE_OTHER): Payer: Medicare Other | Admitting: Family Medicine

## 2020-08-06 DIAGNOSIS — R0981 Nasal congestion: Secondary | ICD-10-CM | POA: Diagnosis not present

## 2020-08-06 DIAGNOSIS — R059 Cough, unspecified: Secondary | ICD-10-CM | POA: Diagnosis not present

## 2020-08-06 MED ORDER — BENZONATATE 100 MG PO CAPS
100.0000 mg | ORAL_CAPSULE | Freq: Three times a day (TID) | ORAL | 0 refills | Status: DC | PRN
Start: 2020-08-06 — End: 2020-08-18

## 2020-08-06 NOTE — Patient Instructions (Signed)
  HOME CARE TIPS:  -[place] testing information: https://www.[facility].org/ OR [phone] Most pharmacies also offer testing and home test kits.  -I sent the medication(s) we discussed to your pharmacy: Meds ordered this encounter  Medications  . benzonatate (TESSALON PERLES) 100 MG capsule    Sig: Take 1 capsule (100 mg total) by mouth 3 (three) times daily as needed.    Dispense:  20 capsule    Refill:  0     -COVID19 outpatient treatment center: [phone] (only call if your Covid test is positive and you are interested in monoclonal antibody treatment which is available to those with risk factors within 10 days of symptom onset)  -can use tylenol if needed for fevers, aches and pains per instructions  -can use nasal saline a few times per day if nasal congestion, sometime a short course of Afrin nasal spray for 3 days can help as well  -stay hydrated, drink plenty of fluids and eat small healthy meals - avoid dairy  -If the Covid test is positive, check out the [facility] website for more information on home care, transmission and treatment for COVID19  -follow up with your doctor in 2-3 days unless improving and feeling better  -stay home while sick, except to seek medical care, and if you have [place] please stay home for a full 10 days since the onset of symptoms PLUS one day of no fever and feeling better.  It was nice to meet you today, and I really hope you are feeling better soon. I help [place] out with telemedicine visits on Tuesdays and Thursdays and am available for visits on those days. If you have any concerns or questions following this visit please schedule a follow up visit with your Primary Care doctor or seek care at a local urgent care clinic to avoid delays in care.    Seek in person care promptly if your symptoms worsen, new concerns arise or you are not improving with treatment. Call 911 and/or seek emergency care if  you symptoms are severe or life threatening.

## 2020-08-06 NOTE — Progress Notes (Signed)
Virtual Visit via Telephone Note  I connected with Mike Day on 08/06/20 at 10:40 AM EST by telephone and verified that I am speaking with the correct person using two identifiers.   I discussed the limitations, risks, security and privacy concerns of performing an evaluation and management service by telephone and the availability of in person appointments. I also discussed with the patient that there may be a patient responsible charge related to this service. The patient expressed understanding and agreed to proceed.  Location patient: home, [place] Location provider: work or home office Participants present for the call: patient, provider, patient's wife Patient did not have a visit with me in the prior 7 days to address this/these issue(s).   History of Present Illness:  Acute telemedicine visit for cough and congestion: -Onset: 3-4 days ago after our doing the leaves -Symptoms include: nasal congestion, PND, cough, low grade temp 99.2, soft stool -now main issues is nasal congestion -Denies: fevers, NVD, body aches, SOB, CP -no known sick contacts -Has tried: -Pertinent past medical history: denies lung disease or seasonal  -Pertinent medication allergies: -COVID-19 vaccine status: fully vaccinated for covid + booster, and had flu shot   Observations/Objective: Patient sounds cheerful and well on the phone. I do not appreciate any SOB. Speech and thought processing are grossly intact. Patient reported vitals:  Assessment and Plan:  Nasal congestion  Cough  -we discussed possible serious and likely etiologies, options for evaluation and workup, limitations of telemedicine visit vs in person visit, treatment, treatment risks and precautions. Pt prefers to treat via telemedicine empirically rather than in person at this moment.  Query viral upper respiratory illness, allergic rhinitis versus other.  Feel Covid is less likely given fully vaccinated plus booster, however we did  advise testing given he is high risk and discussed options for testing, treatment, potential complications and precautions.  Influenza seems less likely given symptoms and opted against Tamiflu given duration of symptoms.  He opted for symptomatic treatment with nasal saline, Tylenol if needed and sent a Tessalon prescription for cough.  Scheduled follow up with PCP offered: He agrees to follow-up if needed.  Advised to seek prompt in person care if worsening, new symptoms arise, or if is not improving with treatment.  Follow Up Instructions:  I did not refer this patient for an OV with me in the next 24 hours for this/these issue(s).  I discussed the assessment and treatment plan with the patient. The patient was provided an opportunity to ask questions and all were answered. The patient agreed with the plan and demonstrated an understanding of the instructions.   I spent 19 minutes on this encounter.   Lucretia Kern, DO

## 2020-08-08 DIAGNOSIS — Z03818 Encounter for observation for suspected exposure to other biological agents ruled out: Secondary | ICD-10-CM | POA: Diagnosis not present

## 2020-08-08 DIAGNOSIS — Z20822 Contact with and (suspected) exposure to covid-19: Secondary | ICD-10-CM | POA: Diagnosis not present

## 2020-08-14 DIAGNOSIS — K4090 Unilateral inguinal hernia, without obstruction or gangrene, not specified as recurrent: Secondary | ICD-10-CM | POA: Diagnosis not present

## 2020-08-17 ENCOUNTER — Telehealth: Payer: Self-pay

## 2020-08-17 NOTE — Telephone Encounter (Signed)
Patient had mychart visit on 12/2 with Dr. Colin Benton. He is not any better.  He reports he still has cough and head/chest congestion. Patient is scheduled for surgery next week.  He said they will probably not do the surgery until he is well and symptom free.  Does Dr. Raoul Pitch need to see patient in office OR virtual appt?  Please advise and call patient [phone].

## 2020-08-17 NOTE — Telephone Encounter (Signed)
Pt sched in office

## 2020-08-17 NOTE — Telephone Encounter (Signed)
Routing to team pool

## 2020-08-17 NOTE — Telephone Encounter (Signed)
Please schedule him for in office appt asap for evaluation to see if he needs cxr or abx.  It would be difficult for me to provide him with recommendations since I did not see him for this illness.  Thanks

## 2020-08-17 NOTE — Telephone Encounter (Signed)
Spoke with pt who stated that he does not want appt but would like to know what else could be done. Pt was informed he will need to be evaluated. Pt had neg COVID test one week ago; Please advise if in office is okay

## 2020-08-18 ENCOUNTER — Encounter: Payer: Self-pay | Admitting: Family Medicine

## 2020-08-18 ENCOUNTER — Other Ambulatory Visit: Payer: Self-pay

## 2020-08-18 ENCOUNTER — Ambulatory Visit (INDEPENDENT_AMBULATORY_CARE_PROVIDER_SITE_OTHER): Payer: Medicare Other | Admitting: Family Medicine

## 2020-08-18 VITALS — BP 145/80 | HR 71 | Temp 98.4°F | Ht 75.0 in | Wt 192.0 lb

## 2020-08-18 DIAGNOSIS — B349 Viral infection, unspecified: Secondary | ICD-10-CM

## 2020-08-18 NOTE — Progress Notes (Signed)
This visit occurred during the SARS-CoV-2 public health emergency.  Safety protocols were in place, including screening questions prior to the visit, additional usage of staff PPE, and extensive cleaning of exam room while observing appropriate contact time as indicated for disinfecting solutions.    Mike Day , 06/15/1946, 74 y.o., male MRN: [id number] Patient Care Team    Relationship Specialty Notifications Start End  Ma Hillock, DO PCP - General Family Medicine  06/11/18   Rod Can, MD Consulting Physician Orthopedic Surgery  10/03/15   Sharmon Revere Physician Assistant Cardiology  04/29/16   Rolm Bookbinder, MD Consulting Physician Dermatology  08/24/17   Myrlene Broker, MD Attending Physician Urology  06/11/18   Warden Fillers, MD Consulting Physician Ophthalmology  06/11/18   Sherren Mocha, MD Consulting Physician Cardiology  01/21/20   Rosemarie Ax, MD Consulting Physician Sports Medicine  01/21/20     Chief Complaint  Patient presents with  . Cough    Pt c/o dry cough, nasal congestion x 2.5 weeks     Subjective: Pt presents for an OV to be evaluated for mild cough. Pt reports he was diagnosed with viral sinus infection 2.5 weeks ago. He reports he is feeling much better and thinks he is over the infection, but wanted to make sure since he has two surgeries coming up. He denies any fever, chills, or shortness of breath. He has been covid negative.  He reports a mild occasional cough remains.   Depression screen [facility] 2/9 08/18/2020 02/26/2019 06/05/2018 08/24/2017 06/05/2015  Decreased Interest 0 0 0 0 0  Down, Depressed, Hopeless 0 0 0 0 0  PHQ - 2 Score 0 0 0 0 0    Allergies  Allergen Reactions  . Levofloxacin Rash  . Neosporin [Neomycin-Bacitracin Zn-Polymyx] Other (See Comments)    Dry/scaly skin  . Tape Other (See Comments)    ADHESIVE TAPE; BLISTERS,   Social History   Social History Narrative   Married, 1 son, 2  grand-daughters.   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   Alcohol: 1 glass red wine per night.   Was once a long distance runner.   Past Medical History:  Diagnosis Date  . Anemia    Early 20's  . Atrial fibrillation ([place]) 10/07/2014   3-14 day cardiac monitor 03/2019:  AFib, Avg HR 69, no significant pauses, PVCs, wide complex runs (aberrancy vs NSVT - longest 32 beats).  . Atrial flutter [facility]) 2008   Status post [facility] Dr Cristopher Peru 2008  . Carotid artery plaque    Carotid US 8/21: Bilat ICA 1-39  . Chronic atrial fibrillation (HCC)    Eliquis  . Diabetes mellitus without complication ([place])    Dx by A1c criteria 2014  . Echocardiograms    Echo 12/14 - Severe basal septal hypertrophy, moderate LVH, EF 60-65%, moderate LAE, mild RAE // Echo 7/20: EF 60-65, severe asymmetric LVH, mild RVE, normal RV SF, moderate BAE, moderate MAC, moderate aortic valve calcification (noncoronary cusp), mild dilation of ascending aorta (41 mm)  . Hx of adenomatous colonic polyps   . Hyperlipidemia   . Hypertension   . Malignant neoplasm of prostate ([place]) 05/18/2012  . Patellar sleeve fracture of left knee 2021  . Prostate cancer [facility]) 2006   Prostatectomy  . Shingles 01/2019   left side of face/eye/neck/ear    Past Surgical History:  Procedure Laterality Date  .  COLONOSCOPY W/ POLYPECTOMY  2003;2010;04/2014   Tubular adenoma: recall 5 yrs (after 04/2019)  . HERNIA REPAIR  07/2008   Dr.Martin  . MOHS SURGERY     left hand  . 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 RADIOFREQUENCY ABLATION  01/15/2007   for ectopic atrial foci  . SEPTOPLASTY  Age 30  . TRANSTHORACIC ECHOCARDIOGRAM  09/02/2013   Normal.  EF 60%.  LAE and RAE.   Family History  Problem Relation Age of Onset  . Lung disease Father        Black Lung  . Heart disease Father        Congenital  . Emphysema Father   . COPD Father   . Diabetes Mother   .  Stroke Mother 34  . Hypertension Brother   . Heart disease Brother   . Hyperlipidemia Brother   . Heart attack Brother 68       sudden death  . Cancer Neg Hx   . Colon cancer Neg Hx   . Esophageal cancer Neg Hx   . Rectal cancer Neg Hx   . Stomach cancer Neg Hx    Allergies as of 08/18/2020      Reactions   Levofloxacin Rash   Neosporin [neomycin-bacitracin Zn-polymyx] Other (See Comments)   Dry/scaly skin   Tape Other (See Comments)   ADHESIVE TAPE; BLISTERS,      Medication List       Accurate as of August 18, 2020  9:44 AM. If you have any questions, ask your nurse or doctor.        STOP taking these medications   benzonatate 100 MG capsule Commonly known as: Best boy Stopped by: Howard Pouch, DO     TAKE these medications   amLODipine 2.5 MG tablet Commonly known as: NORVASC TAKE 1 TABLET TWICE A DAY What changed:   how much to take  how to take this  when to take this   Eliquis 5 MG Tabs tablet Generic drug: apixaban TAKE 1 TABLET TWICE A DAY   lisinopril 20 MG tablet Commonly known as: ZESTRIL TAKE 1 TABLET DAILY (NOTE  DOSE CHANGE)   simvastatin 20 MG tablet Commonly known as: ZOCOR TAKE 1 TABLET AT BEDTIME       All past medical history, surgical history, allergies, family history, immunizations andmedications were updated in the EMR today and reviewed under the history and medication portions of their EMR.     ROS: Negative, with the exception of above mentioned in HPI   Objective:  BP (!) 145/80   Pulse 71   Temp 98.4 F (36.9 C) (Oral)   Ht _0  (1.905 m)   Wt 192 lb (87.1 kg)   SpO2 100%   BMI 24.00 kg/m  Body mass index is 24 kg/m. Gen: Afebrile. No acute distress. Nontoxic in appearance, well developed, well nourished.  HENT: AT. [place]. Bilateral TM visualized without erythema or bulging. MMM, no oral lesions. Bilateral nares without erythema or drainage. Throat without erythema or exudates. No cough on exam.   Eyes:Pupils Equal Round Reactive to light, Extraocular movements intact,  Conjunctiva without redness, discharge or icterus. Neck/lymp/endocrine: Supple,no lymphadenopathy CV: RRR, no edema Chest: CTAB, no wheeze or crackles. Good air movement, normal resp effort.  Skin: no rashes, purpura or petechiae.  Neuro:  Normal gait. PERLA. EOMi. Alert. Oriented x3  Psych: Normal affect, dress and demeanor. Normal speech. Normal thought content and judgment.  No exam  data present No results found. No results found for this or any previous visit (from the past 24 hour(s)).  Assessment/Plan: KEYONTAY STOLZ is a 74 y.o. male present for OV for  Viral syndrome Rest, hydrate.  start mucinex (DM if cough) Continue  nasal saline.  Lungs clear on exam F/U 2 weeks of not improved.     Reviewed expectations re: course of current medical issues.  Discussed self-management of symptoms.  Outlined signs and symptoms indicating need for more acute intervention.  Patient verbalized understanding and all questions were answered.  Patient received an After-Visit Summary.    No orders of the defined types were placed in this encounter.  No orders of the defined types were placed in this encounter.  Referral Orders  No referral(s) requested today     Note is dictated utilizing voice recognition software. Although note has been proof read prior to signing, occasional typographical errors still can be missed. If any questions arise, please do not hesitate to call for verification.   electronically signed by:  Howard Pouch, DO  [place]

## 2020-08-18 NOTE — Patient Instructions (Signed)
    Your lungs sound good and clear.   Good luck next week.

## 2020-08-19 NOTE — Progress Notes (Addendum)
PCP - Howard Pouch MD  [place] Cardiologist - [name] ,[place]  Per Marvis Moeller, NP pt. Needs to be seen prior to surgery  PPM/ICD -  Device Orders -  Rep Notified -   Chest x-ray -  EKG - 11-26-19 epic Stress Test -  ECHO -  Cardiac Cath -   Sleep Study -  CPAP -   Fasting Blood Sugar -  Checks Blood Sugar _____ times a day  Blood Thinner Instructions:elequis Dr. Carlye Grippe nurse will call pt. With instructions per triage nurse per [facility] triage nurse Aspirin Instructions:  ERAS Protcol - PRE-SURGERY  COVID TEST- 12-17  Activity--no  SOB with walking 10,000 steps 3 x a week Anesthesia review: Afib/flutter, pre dm   Patient denies shortness of breath, fever, cough and chest pain at PAT appointment  none   All instructions explained to the patient, with a verbal understanding of the material. Patient agrees to go over the instructions while at home for a better understanding. Patient also instructed to self quarantine after being tested for COVID-19. The opportunity to ask questions was provided.

## 2020-08-19 NOTE — Patient Instructions (Addendum)
DUE TO COVID-19 ONLY ONE VISITOR IS ALLOWED TO COME WITH YOU AND STAY IN THE WAITING ROOM ONLY DURING PRE OP AND PROCEDURE DAY OF SURGERY. THE 1 VISITOR  MAY VISIT WITH YOU AFTER SURGERY IN YOUR PRIVATE ROOM DURING VISITING HOURS ONLY!  YOU NEED TO HAVE A COVID 19 TEST ON_12-17-21______ @_______ , THIS TEST MUST BE DONE BEFORE SURGERY,  COVID TESTING SITE [address] [place] [address], IT IS ON THE RIGHT GOING OUT [address] APPROXIMATELY  2 MINUTES PAST [employer] ON THE RIGHT. ONCE YOUR COVID TEST IS COMPLETED,  PLEASE BEGIN THE QUARANTINE INSTRUCTIONS AS OUTLINED IN YOUR HANDOUT.                Mike Day  08/19/2020   Your procedure is scheduled on: 08-25-20   Report to [facility] Main  Entrance   Report to admitting at       12:30 PM     Call this number if you have problems the morning of surgery [phone]    Remember: Do not eat food  :After Midnight.  You may have clear liquids until 1130 am then nothing by mouth     CLEAR LIQUID DIET   Foods Allowed                                                                                     Black Coffee and tea, regular and decaf                                                    Plain Jell-O any favor except red or purple                                      Fruit ices (not with fruit pulp)                                                    Iced Popsicles                                                                                Cranberry, grape and apple juices Sports drinks like Gatorade Lightly seasoned clear broth or consume(fat free) Sugar, honey syrup     BRUSH YOUR TEETH MORNING OF SURGERY AND RINSE YOUR MOUTH OUT, NO CHEWING GUM CANDY OR MINTS.     Take these medicines the morning of surgery with A SIP OF WATER: amlodipine  DO NOT TAKE  ANY DIABETIC MEDICATIONS DAY OF YOUR SURGERY                               You may not have any metal on your body including hair  pins and              piercings  Do not wear jewelry,lotions, powders or perfumes, deodorant                    Men may shave face and neck.   Do not bring valuables to the hospital. [place].  Contacts, dentures or bridgework may not be worn into surgery.  Leave suitcase in the car. After surgery it may be brought to your room.     Patients discharged the day of surgery will not be allowed to drive home. IF YOU ARE HAVING SURGERY AND GOING HOME THE SAME DAY, YOU MUST HAVE AN ADULT TO DRIVE YOU HOME AND BE WITH YOU FOR 24 HOURS. YOU MAY GO HOME BY TAXI OR UBER OR ORTHERWISE, BUT AN ADULT MUST ACCOMPANY YOU HOME AND STAY WITH YOU FOR 24 HOURS.  Name and phone number of your driver:  Special Instructions: N/A              Please read over the following fact sheets you were given: _____________________________________________________________________             [facility] - Preparing for Surgery Before surgery, you can play an important role.  Because skin is not sterile, your skin needs to be as free of germs as possible.  You can reduce the number of germs on your skin by washing with CHG (chlorahexidine gluconate) soap before surgery.  CHG is an antiseptic cleaner which kills germs and bonds with the skin to continue killing germs even after washing. Please DO NOT use if you have an allergy to CHG or antibacterial soaps.  If your skin becomes reddened/irritated stop using the CHG and inform your nurse when you arrive at Short Stay. Do not shave (including legs and underarms) for at least 48 hours prior to the first CHG shower.  You may shave your face/neck. Please follow these instructions carefully:  1.  Shower with CHG Soap the night before surgery and the  morning of Surgery.  2.  If you choose to wash your hair, wash your hair first as usual with your  normal  shampoo.  3.  After you shampoo, rinse your hair and body thoroughly to  remove the  shampoo.                           4.  Use CHG as you would any other liquid soap.  You can apply chg directly  to the skin and wash                       Gently with a scrungie or clean washcloth.  5.  Apply the CHG Soap to your body ONLY FROM THE NECK DOWN.   Do not use on face/ open                           Wound or open sores. Avoid contact with eyes, ears mouth  and genitals (private parts).                       Wash face,  Genitals (private parts) with your normal soap.             6.  Wash thoroughly, paying special attention to the area where your surgery  will be performed.  7.  Thoroughly rinse your body with warm water from the neck down.  8.  DO NOT shower/wash with your normal soap after using and rinsing off  the CHG Soap.                9.  Pat yourself dry with a clean towel.            10.  Wear clean pajamas.            11.  Place clean sheets on your bed the night of your first shower and do not  sleep with pets. Day of Surgery : Do not apply any lotions/deodorants the morning of surgery.  Please wear clean clothes to the hospital/surgery center.  FAILURE TO FOLLOW THESE INSTRUCTIONS MAY RESULT IN THE CANCELLATION OF YOUR SURGERY PATIENT SIGNATURE_________________________________  NURSE SIGNATURE__________________________________  ________________________________________________________________________

## 2020-08-20 ENCOUNTER — Encounter (HOSPITAL_COMMUNITY): Payer: Self-pay

## 2020-08-20 ENCOUNTER — Telehealth: Payer: Self-pay | Admitting: *Deleted

## 2020-08-20 ENCOUNTER — Encounter (HOSPITAL_COMMUNITY)
Admission: RE | Admit: 2020-08-20 | Discharge: 2020-08-20 | Disposition: A | Discharge status: Home / Self Care | Payer: Medicare Other | Source: Ambulatory Visit | Attending: Surgery | Admitting: Surgery

## 2020-08-20 ENCOUNTER — Other Ambulatory Visit: Payer: Self-pay

## 2020-08-20 DIAGNOSIS — Z01812 Encounter for preprocedural laboratory examination: Secondary | ICD-10-CM | POA: Diagnosis not present

## 2020-08-20 HISTORY — DX: Cardiac arrhythmia, unspecified: I49.9

## 2020-08-20 HISTORY — DX: Myoneural disorder, unspecified: G70.9

## 2020-08-20 LAB — CBC
HCT: 40.7 % (ref 39.0–52.0)
Hemoglobin: 13.7 g/dL (ref 13.0–17.0)
MCH: 33.8 pg (ref 26.0–34.0)
MCHC: 33.7 g/dL (ref 30.0–36.0)
MCV: 100.5 fL — ABNORMAL HIGH (ref 80.0–100.0)
Platelets: 216 10*3/uL (ref 150–400)
RBC: 4.05 MIL/uL — ABNORMAL LOW (ref 4.22–5.81)
RDW: 12.6 % (ref 11.5–15.5)
WBC: 6.3 10*3/uL (ref 4.0–10.5)
nRBC: 0 % (ref 0.0–0.2)

## 2020-08-20 LAB — BASIC METABOLIC PANEL
Anion gap: 10 (ref 5–15)
BUN: 13 mg/dL (ref 8–23)
CO2: 27 mmol/L (ref 22–32)
Calcium: 9.3 mg/dL (ref 8.9–10.3)
Chloride: 97 mmol/L — ABNORMAL LOW (ref 98–111)
Creatinine, Ser: 0.74 mg/dL (ref 0.61–1.24)
GFR, Estimated: >60 mL/min (ref >60)
Glucose, Bld: 131 mg/dL — ABNORMAL HIGH (ref 70–99)
Potassium: 4 mmol/L (ref 3.5–5.1)
Sodium: 134 mmol/L — ABNORMAL LOW (ref 135–145)

## 2020-08-20 LAB — HEMOGLOBIN A1C
Hgb A1c MFr Bld: 6.5 % — ABNORMAL HIGH (ref 4.8–5.6)
Mean Plasma Glucose: 139.85 mg/dL

## 2020-08-20 LAB — GLUCOSE, CAPILLARY: Glucose-Capillary: 159 mg/dL — ABNORMAL HIGH (ref 70–99)

## 2020-08-20 NOTE — Telephone Encounter (Signed)
   [facility] Pre-operative Risk Assessment    HEARTCARE STAFF: - Please ensure there is not already an duplicate clearance open for this procedure. - Under Visit Info/Reason for Call, type in Other and utilize the format Clearance MM/DD/YY or Clearance TBD. Do not use dashes or single digits. - If request is for dental extraction, please clarify the # of teeth to be extracted.  Request for surgical clearance:  1. What type of surgery is being performed? HERNIA REPAIR   2. When is this surgery scheduled? 08/25/20   3. What type of clearance is required (medical clearance vs. Pharmacy clearance to hold med vs. Both)? BOTH  4. Are there any medications that need to be held prior to surgery and how long? ELIQUIS   5. Practice name and name of physician performing surgery? [facility]; DR. Rodman Key MARTIN   6. What is the office phone number? [phone]   7.   What is the office fax number? [place]: Malachi Bonds, CMA  8.   Anesthesia type (None, local, MAC, general) ? GENERAL   Julaine Hua 08/20/2020, 1:44 PM  _________________________________________________________________   (provider comments below)

## 2020-08-20 NOTE — Telephone Encounter (Signed)
Primary Cardiologist:No primary care provider on file.  Chart reviewed as part of pre-operative protocol coverage. Because of Mike Day past medical history and time since last visit, he/she will require a follow-up visit in order to better assess preoperative cardiovascular risk.  Pre-op covering staff: - Please schedule appointment and call patient to inform them. - Please contact requesting surgeon's office via preferred method (i.e, phone, fax) to inform them of need for appointment prior to surgery.  If applicable, this message will also be routed to pharmacy pool and/or primary cardiologist for input on holding anticoagulant/antiplatelet agent as requested below so that this information is available at time of patient's appointment.   Deberah Pelton, NP  08/20/2020, 3:22 PM

## 2020-08-20 NOTE — Telephone Encounter (Signed)
Patient with diagnosis of A Fib on Eliquis for anticoagulation.    Procedure: Hernia Repair   Date of procedure: 08/25/20  CHA2DS2-VASc Score = 4  This indicates a 4.8% annual risk of stroke. The patient's score is based upon: CHF History: No HTN History: Yes Diabetes History: Yes Stroke History: No Vascular Disease History: Yes Age Score: 1 Gender Score: 0   CrCl 104 mL/min Platelet count 216K  Per office protocol, patient can hold Eliquis for 2-3 days prior to procedure.    Patient will not need bridging with Lovenox (enoxaparin) around procedure.

## 2020-08-20 NOTE — Telephone Encounter (Signed)
Called pt and scheduled appt for tomorrow w/Scott, PA-C at [facility] office pt will arrive early wearing a mask

## 2020-08-21 ENCOUNTER — Encounter: Payer: Self-pay | Admitting: Physician Assistant

## 2020-08-21 ENCOUNTER — Ambulatory Visit (INDEPENDENT_AMBULATORY_CARE_PROVIDER_SITE_OTHER): Payer: Medicare Other | Admitting: Physician Assistant

## 2020-08-21 VITALS — BP 120/82 | HR 59 | Ht 75.0 in | Wt 189.0 lb

## 2020-08-21 DIAGNOSIS — I4821 Permanent atrial fibrillation: Secondary | ICD-10-CM

## 2020-08-21 DIAGNOSIS — I10 Essential (primary) hypertension: Secondary | ICD-10-CM

## 2020-08-21 DIAGNOSIS — Z01810 Encounter for preprocedural cardiovascular examination: Secondary | ICD-10-CM | POA: Diagnosis not present

## 2020-08-21 NOTE — Patient Instructions (Signed)
Medication Instructions:  Your physician recommends that you continue on your current medications as directed. Please refer to the Current Medication list given to you today.  *If you need a refill on your cardiac medications before your next appointment, please call your pharmacy*  Lab Work: None ordered today  Testing/Procedures: None ordered today  Follow-Up: At [facility], you and your health needs are our priority.  As part of our continuing mission to provide you with exceptional heart care, we have created designated Provider Care Teams.  These Care Teams include your primary Cardiologist (physician) and Advanced Practice Providers (APPs -  Physician Assistants and Nurse Practitioners) who all work together to provide you with the care you need, when you need it.  Your next appointment:   12 month(s)  The format for your next appointment:   In Person  Provider:   [name], PA-C  Other Instructions Call your surgeons office to see if they want you to hold Eliquis 2 days prior to surgery or 3 days prior

## 2020-08-21 NOTE — Progress Notes (Signed)
Cardiology Office Note:    Date:  08/21/2020   ID:  Mike Day, DOB 10/11/1945, MRN [id number]  PCP:  Ma Hillock, DO  [facility] Cardiologist:  Sherren Mocha, MD / [name], PA-C  [place] Electrophysiologist:  None   Referring MD: Ma Hillock, DO   Chief Complaint:  Follow-up (AFib, Surgical Clearance )    Patient Profile:    Mike Day is a 74 y.o. male with:   AFlutter s/p RFCA  Permanent AFib ? CHADS2-VASc=3(DM,HTN, age x58). ? Apixaban Rx  Diabetes mellitus  Hypertension   Hyperlipidemia   Prostate CA s/p prostatectomy  Echocardiogram 03/2019: EF 60-65  Event monitor 03/2019: AFib, Avg HR 69, PVCs, wide complex rhythm (NSVT vs aberrancy)  Carotid Dz ? [place] 03/2019: Bilat ICA 1-39 ? [place] 8/21: bilat ICA 1-39  Aortic atherosclerosis (CT 12/2019)  Prior CV studies: Carotid US 04/13/20 Bilateral ICA 1-39  Echocardiogram 04/02/2019 EF 60-65, severe asymmetric LVH(no LVOT gradient), normal RVSF, mild RVE, RVSP 24.6, mod BAE, mod MAC, ascending aortal mildly dilated (41 mm)  LT Monitor (3-14 days) 03/13/2019 AFib, avg HR 69, wide complex runs (NSVT vs aberrancy) - longest 32 beats  Carotid US 03/19/2019 Bilateral ICA 1-39  ETT 10/16 No ST segment changes with exercise   Echo 09/02/13  Severe basal septal hypertrophy, moderate LVH, EF 60-65%, moderate LAE, mild RAE  History of Present Illness:    Mike Day was last seen in March 2021.  He returns for surgical clearance.  He is scheduled for herniorrhaphy 08/25/2020 with Dr. Johnathan Hausen.  The patient has already been cleared by pharmacy to hold Apixaban 2 to 3 days prior to procedure.  Since last seen, he has done well.  He has not had chest pain, shortness of breath, syncope, melena, hematochezia, hematuria.        Past Medical History:  Diagnosis Date  . Anemia    Early 20's  . Atrial fibrillation ([place]) 10/07/2014   3-14 day cardiac monitor 03/2019:  AFib, Avg HR 69, no  significant pauses, PVCs, wide complex runs (aberrancy vs NSVT - longest 32 beats).  . Atrial flutter [facility]) 2008   Status post RFCA Dr Cristopher [place] 2008  . Carotid artery plaque    Carotid US 8/21: Bilat ICA 1-39  . Chronic atrial fibrillation (HCC)    Eliquis  . Diabetes mellitus without complication (HCC)    no meds   . Dysrhythmia    a fib   . Echocardiograms    Echo 12/14 - Severe basal septal hypertrophy, moderate LVH, EF 60-65%, moderate LAE, mild RAE // Echo 7/20: EF 60-65, severe asymmetric LVH, mild RVE, normal RV SF, moderate BAE, moderate MAC, moderate aortic valve calcification (noncoronary cusp), mild dilation of ascending aorta (41 mm)  . Hx of adenomatous colonic polyps   . Hyperlipidemia   . Hypertension   . Malignant neoplasm of prostate ([place]) 05/18/2012  . Neuromuscular disorder (HCC)    neuropathy feet  . Patellar sleeve fracture of left knee 2021  . Prostate cancer [facility]) 2006   Prostatectomy  . Shingles 01/2019   left side of face/eye/neck/ear     Current Medications: Current Meds  Medication Sig  . acetaminophen (TYLENOL) 500 MG tablet Take 1,000 mg by mouth every 8 (eight) hours as needed for moderate pain.  [name] amLODipine (NORVASC) 2.5 MG tablet Take 2.5 mg by mouth daily.  [name] ELIQUIS 5 MG TABS tablet TAKE 1 TABLET TWICE A DAY  .  Emollient (CETAPHIL) cream Apply 1 application topically daily.  [name] guaiFENesin-dextromethorphan (ROBITUSSIN DM) 100-10 MG/5ML syrup Take 5 mLs by mouth every 4 (four) hours as needed for cough.  [name] lisinopril (ZESTRIL) 20 MG tablet TAKE 1 TABLET DAILY (NOTE  DOSE CHANGE)  . simvastatin (ZOCOR) 20 MG tablet TAKE 1 TABLET AT BEDTIME  . triamcinolone (NASACORT) 55 MCG/ACT AERO nasal inhaler Place 1 spray into the nose daily as needed (allergies).     Allergies:   Levofloxacin, Neosporin [neomycin-bacitracin zn-polymyx], and Tape   Social History   Tobacco Use  . Smoking status: Former Smoker    Quit date: 09/06/2007    Years since  quitting: 12.9  . Smokeless tobacco: Never Used  . Tobacco comment: smoked 1963 -2009; total 5 packs of cigarettes in life . Cigars < 1 / day on average 1992-2009  Vaping Use  . Vaping Use: Never used  Substance Use Topics  . Alcohol use: Yes    Alcohol/week: 7.0 standard drinks    Types: 7 Glasses of wine per week    Comment: Socially  . Drug use: No     Family Hx: The patient's family history includes COPD in his father; Diabetes in his mother; Emphysema in his father; Heart attack (age of onset: 61) in his brother; Heart disease in his brother and father; Hyperlipidemia in his brother; Hypertension in his brother; Lung disease in his father; Stroke (age of onset: 69) in his mother. There is no history of Cancer, Colon cancer, Esophageal cancer, Rectal cancer, or Stomach cancer.  ROS   EKGs/Labs/Other Test Reviewed:    EKG:  EKG is   ordered today.  The ekg ordered today demonstrates atrial fibrillation, HR 59, normal axis, QTC 429, no change from prior tracing  Recent Labs: 12/16/2019: ALT 13 08/20/2020: BUN 13; Creatinine, Ser 0.74; Hemoglobin 13.7; Platelets 216; Potassium 4.0; Sodium 134   Recent Lipid Panel Lab Results  Component Value Date/Time   CHOL 117 12/16/2019 01:47 PM   TRIG 63.0 12/16/2019 01:47 PM   HDL 45.90 12/16/2019 01:47 PM   CHOLHDL 3 12/16/2019 01:47 PM   LDLCALC 59 12/16/2019 01:47 PM      Risk Assessment/Calculations:     CHA2DS2-VASc Score = 4  This indicates a 4.8% annual risk of stroke. The patient's score is based upon: CHF History: No HTN History: Yes Diabetes History: Yes Stroke History: No Vascular Disease History: Yes Age Score: 1 Gender Score: 0     Physical Exam:    VS:  BP 120/82   Pulse (!) 59   Ht _0  (1.905 m)   Wt 189 lb (85.7 kg)   SpO2 95%   BMI 23.62 kg/m     Wt Readings from Last 3 Encounters:  08/21/20 189 lb (85.7 kg)  08/20/20 189 lb (85.7 kg)  08/18/20 192 lb (87.1 kg)     Constitutional:       Appearance: Healthy appearance. Not in distress.  Neck:     Vascular: JVD normal.  Pulmonary:     Effort: Pulmonary effort is normal.     Breath sounds: No wheezing. No rales.  Cardiovascular:     Normal rate. Irregularly irregular rhythm. Normal S1. Normal S2.     Murmurs: There is no murmur.  Edema:    Peripheral edema absent.  Abdominal:     Palpations: Abdomen is soft.  Skin:    General: Skin is warm and dry.  Neurological:     General: No focal deficit present.  Mental Status: Alert and oriented to person, place and time.     Cranial Nerves: Cranial nerves are intact.       ASSESSMENT & PLAN:    1. Preoperative cardiovascular examination   Mr. Thibeau perioperative risk of a major cardiac event is 0.4% according to the Revised Cardiac Risk Index (RCRI).  Therefore, he is at low risk for perioperative complications.    Recommendations: According to ACC/AHA guidelines, no further cardiovascular testing needed.  The patient may proceed to surgery at acceptable risk.   Antiplatelet and/or Anticoagulation Recommendations: Eliquis (Apixaban) can be held for 2-3 days prior to surgery.  Please resume post op when felt to be safe.    2. Permanent atrial fibrillation (HCC) Rate controlled.  Recent Creatinine and Hgb normal.  Continue current dose of Apixaban.  3. Essential hypertension The patient's blood pressure is controlled on his current regimen.  Continue current therapy.         Dispo:  Return in about 1 year (around 08/21/2021) for Routine Follow Up, w/ [name], PA-C.   Medication Adjustments/Labs and Tests Ordered: Current medicines are reviewed at length with the patient today.  Concerns regarding medicines are outlined above.  Tests Ordered: Orders Placed This Encounter  Procedures  . EKG 12-Lead   Medication Changes: No orders of the defined types were placed in this encounter.   Signed, [name], PA-C  08/21/2020 9:21 AM    [facility] [address], [place], [place]  [address] Phone: [phone]; Fax: [phone]

## 2020-08-21 NOTE — Anesthesia Preprocedure Evaluation (Addendum)
Anesthesia Evaluation  Patient identified by MRN, date of birth, ID band Patient awake    Reviewed: Allergy & Precautions, NPO status , Patient's Chart, lab work & pertinent test results  Airway Mallampati: II  TM Distance: >3 FB Neck ROM: Full    Dental  (+) Teeth Intact   Pulmonary neg pulmonary ROS, former smoker,    Pulmonary exam normal        Cardiovascular hypertension, Pt. on medications + dysrhythmias (on Eliquis ) Atrial Fibrillation  Rhythm:Irregular Rate:Normal     Neuro/Psych negative neurological ROS  negative psych ROS   GI/Hepatic Neg liver ROS, Inguinal hernia   Endo/Other  diabetes, Well Controlled, Type 2  Renal/GU negative Renal ROS   Prostate Ca s/p prostatectomy    Musculoskeletal negative musculoskeletal ROS (+)   Abdominal (+)  Abdomen: soft. Bowel sounds: normal.  Peds  Hematology  (+) anemia ,   Anesthesia Other Findings   Reproductive/Obstetrics                           Anesthesia Physical Anesthesia Plan  ASA: III  Anesthesia Plan: General   Post-op Pain Management:    Induction: Intravenous  PONV Risk Score and Plan: 2 and Ondansetron, Dexamethasone and Treatment may vary due to age or medical condition  Airway Management Planned: Mask and LMA  Additional Equipment: None  Intra-op Plan:   Post-operative Plan:   Informed Consent: I have reviewed the patients History and Physical, chart, labs and discussed the procedure including the risks, benefits and alternatives for the proposed anesthesia with the patient or authorized representative who has indicated his/her understanding and acceptance.     Dental advisory given  Plan Discussed with: CRNA  Anesthesia Plan Comments: (Per cardiology note 08/21/2020, "Mike Day's perioperative risk of a major cardiac event is 0.4% according to the Revised Cardiac Risk Index (RCRI).  Therefore, he is  at low risk for perioperative complications.    Recommendations: According to ACC/AHA guidelines, no further cardiovascular testing needed.  The patient may proceed to surgery at acceptable risk.   Antiplatelet and/or Anticoagulation Recommendations: Eliquis (Apixaban) can be held for 2-3 days prior to surgery.  Please resume post op when felt to be safe." Lab Results      Component                Value               Date                      WBC                      6.3                 08/20/2020                HGB                      13.7                08/20/2020                HCT                      40.7                08/20/2020  MCV                      100.5 (H)           08/20/2020                PLT                      216                 08/20/2020           Lab Results      Component                Value               Date                      NA                       134 (L)             08/20/2020                K                        4.0                 08/20/2020                CO2                      27                  08/20/2020                GLUCOSE                  131 (H)             08/20/2020                BUN                      13                  08/20/2020                CREATININE               0.74                08/20/2020                CALCIUM                  9.3                 08/20/2020                GFRNONAA                 >60                 08/20/2020                GFRAA                    93  03/25/2019           ECHO 07/20: 1. The left ventricle has normal systolic function with an ejection  fraction of 60-65%. The cavity size was normal. There is severe asymmetric  left ventricular hypertrophy. Left ventricular diastolic function could  not be evaluated secondary to atrial  fibrillation.  2. The right ventricle has normal systolic function. The cavity was  mildly enlarged. There is no increase in right  ventricular wall thickness.  Right ventricular systolic pressure is normal with an estimated pressure  of 24.6 mmHg.  3. Left atrial size was moderately dilated.  4. Right atrial size was moderately dilated.  5. The mitral valve is abnormal. There is moderate mitral annular  calcification present.  6. The aortic valve is abnormal. Moderate calcification primarily of the  noncoronary cusp of the aortic valve. No stenosis of the aortic valve.  7. 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 )    Anesthesia Quick Evaluation

## 2020-08-22 ENCOUNTER — Other Ambulatory Visit (HOSPITAL_COMMUNITY)
Admission: RE | Admit: 2020-08-22 | Discharge: 2020-08-22 | Disposition: A | Discharge status: Home / Self Care | Payer: Medicare Other | Source: Ambulatory Visit | Attending: Surgery | Admitting: Surgery

## 2020-08-22 DIAGNOSIS — Z01812 Encounter for preprocedural laboratory examination: Secondary | ICD-10-CM | POA: Diagnosis not present

## 2020-08-22 DIAGNOSIS — Z20822 Contact with and (suspected) exposure to covid-19: Secondary | ICD-10-CM | POA: Insufficient documentation

## 2020-08-22 LAB — SARS CORONAVIRUS 2 (TAT 6-24 HRS): SARS Coronavirus 2: NEGATIVE

## 2020-08-24 MED ORDER — BUPIVACAINE LIPOSOME 1.3 % IJ SUSP
20.0000 mL | Freq: Once | INTRAMUSCULAR | Status: DC
  Filled 2020-08-24: qty 20

## 2020-08-24 NOTE — H&P (Signed)
Chief Complaint:  Symptomatic left inguinal hernia  History of Present Illness:  Mike Day is an 74 y.o. male with a symptomatic left inguinal hernia.  He has had a pelvic sling by Dr. McDermiad.    Past Medical History:  Diagnosis Date  . Anemia    Early 20's  . Atrial fibrillation (JAARS) 10/07/2014   3-14 day cardiac monitor 03/2019:  AFib, Avg HR 69, no significant pauses, PVCs, wide complex runs (aberrancy vs NSVT - longest 32 beats).  . Atrial flutter [facility]) 2008   Status post RFCA Dr Cristopher Peru 2008  . Carotid artery plaque    Carotid US 8/21: Bilat ICA 1-39  . Chronic atrial fibrillation (HCC)    Eliquis  . Diabetes mellitus without complication (HCC)    no meds   . Dysrhythmia    a fib   . Echocardiograms    Echo 12/14 - Severe basal septal hypertrophy, moderate LVH, EF 60-65%, moderate LAE, mild RAE // Echo 7/20: EF 60-65, severe asymmetric LVH, mild RVE, normal RV SF, moderate BAE, moderate MAC, moderate aortic valve calcification (noncoronary cusp), mild dilation of ascending aorta (41 mm)  . Hx of adenomatous colonic polyps   . Hyperlipidemia   . Hypertension   . Malignant neoplasm of prostate ([place]) 05/18/2012  . Neuromuscular disorder (HCC)    neuropathy feet  . Patellar sleeve fracture of left knee 2021  . Prostate cancer [facility]) 2006   Prostatectomy  . Shingles 01/2019   left side of face/eye/neck/ear     Past Surgical History:  Procedure Laterality Date  . COLONOSCOPY W/ POLYPECTOMY  2003;2010;04/2014   Tubular adenoma: recall 5 yrs (after 04/2019)  . HERNIA REPAIR  07/2008   Dr.Jair Lindblad  . MOHS SURGERY     left hand  . 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 RADIOFREQUENCY ABLATION  01/15/2007   for ectopic atrial foci  . SEPTOPLASTY  Age 63  . TRANSTHORACIC ECHOCARDIOGRAM  09/02/2013   Normal.  EF 60%.  LAE and RAE.    Current Facility-Administered Medications  Medication Dose Route Frequency Provider Last Rate Last Admin  .  [START ON 08/25/2020] bupivacaine liposome (EXPAREL) 1.3 % injection 266 mg  20 mL Infiltration Once Johnathan Hausen, MD       Current Outpatient Medications  Medication Sig Dispense Refill  . acetaminophen (TYLENOL) 500 MG tablet Take 1,000 mg by mouth every 8 (eight) hours as needed for moderate pain.    [name] ELIQUIS 5 MG TABS tablet TAKE 1 TABLET TWICE A DAY 180 tablet 1  . Emollient (CETAPHIL) cream Apply 1 application topically daily.    [name] guaiFENesin-dextromethorphan (ROBITUSSIN DM) 100-10 MG/5ML syrup Take 5 mLs by mouth every 4 (four) hours as needed for cough.    [name] lisinopril (ZESTRIL) 20 MG tablet TAKE 1 TABLET DAILY (NOTE  DOSE CHANGE) 90 tablet 2  . simvastatin (ZOCOR) 20 MG tablet TAKE 1 TABLET AT BEDTIME 90 tablet 2  . triamcinolone (NASACORT) 55 MCG/ACT AERO nasal inhaler Place 1 spray into the nose daily as needed (allergies).    [name] amLODipine (NORVASC) 2.5 MG tablet Take 2.5 mg by mouth daily.     Levofloxacin, Neosporin [neomycin-bacitracin zn-polymyx], and Tape Family History  Problem Relation Age of Onset  . Lung disease Father        Black Lung  . Heart disease Father        Congenital  . Emphysema Father   . COPD Father   .  Diabetes Mother   . Stroke Mother 38  . Hypertension Brother   . Heart disease Brother   . Hyperlipidemia Brother   . Heart attack Brother 59       sudden death  . Cancer Neg Hx   . Colon cancer Neg Hx   . Esophageal cancer Neg Hx   . Rectal cancer Neg Hx   . Stomach cancer Neg Hx    Social History:   reports that he quit smoking about 12 years ago. He has never used smokeless tobacco. He reports current alcohol use of about 7.0 standard drinks of alcohol per week. He reports that he does not use drugs.   REVIEW OF SYSTEMS : Negative except for see problem list and meds  Physical Exam:   There were no vitals taken for this visit. There is no height or weight on file to calculate BMI.  Gen:  WDWN WM NAD  Neurological: Alert and  oriented to person, place, and time. Motor and sensory function is grossly intact  Head: Normocephalic and atraumatic.  Eyes: Conjunctivae are normal. Pupils are equal, round, and reactive to light. No scleral icterus.  Neck: Normal range of motion. Neck supple. No tracheal deviation or thyromegaly present.  Cardiovascular:  SR without murmurs or gallops.  No carotid bruits Breast:  Not examined Respiratory: Effort normal.  No respiratory distress. No chest wall tenderness. Breath sounds normal.  No wheezes, rales or rhonchi.  Abdomen:  nontender GU:  Bulge in the left inguinal region.  No right inguinal hernia.   Musculoskeletal: Normal range of motion. Extremities are nontender. No cyanosis, edema or clubbing noted Lymphadenopathy: No cervical, preauricular, postauricular or axillary adenopathy is present Skin: Skin is warm and dry. No rash noted. No diaphoresis. No erythema. No pallor. Pscyh: Normal mood and affect. Behavior is normal. Judgment and thought content normal.   LABORATORY RESULTS: No results found for this or any previous visit (from the past 48 hour(s)).   RADIOLOGY RESULTS: No results found.  Problem List: Patient Active Problem List   Diagnosis Date Noted  . Closed nondisplaced fracture of left patella 01/20/2020  . Left groin mass 12/16/2019  . Shoulder dislocation, left, subsequent encounter 08/19/2019  . Hx of colonic polyps 06/07/2019  . Post herpetic neuralgia 03/15/2019  . Hyperkalemia 03/15/2019  . Cerebral atrophy ([place]) 03/15/2019  . Cerebral arterial disease 03/15/2019  . Atrial fibrillation ([place]) 10/07/2014  . Dermatochalasis of both upper eyelids 07/01/2014  . Chronic anticoagulation 04/14/2014  . 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 Diabetes mellitus without complication ([place]) 33/82/5053  . SKIN CANCER, HX OF 03/11/2009  . Essential hypertension 09/17/2008  . Hyperlipidemia 11/01/2007  . PROSTATE CANCER, HX OF 01/18/2007  .  Tubular adenoma of colon 01/12/2007    Assessment & Plan: Symptomatic LIH in patient on Eliquis for atrial fibrillation.     Matt B. Hassell Done, MD, [facility] [phone] beeper [phone]  08/24/2020 3:09 PM

## 2020-08-25 ENCOUNTER — Ambulatory Visit (HOSPITAL_COMMUNITY): Payer: Medicare Other | Admitting: Anesthesiology

## 2020-08-25 ENCOUNTER — Other Ambulatory Visit: Payer: Self-pay

## 2020-08-25 ENCOUNTER — Encounter (HOSPITAL_COMMUNITY): Payer: Self-pay | Admitting: Surgery

## 2020-08-25 ENCOUNTER — Ambulatory Visit (HOSPITAL_COMMUNITY): Payer: Medicare Other | Admitting: Physician Assistant

## 2020-08-25 ENCOUNTER — Ambulatory Visit (HOSPITAL_COMMUNITY)
Admission: RE | Admit: 2020-08-25 | Discharge: 2020-08-25 | Disposition: A | Discharge status: Home / Self Care | Payer: Medicare Other | Source: Ambulatory Visit | Attending: Surgery | Admitting: Surgery

## 2020-08-25 ENCOUNTER — Encounter (HOSPITAL_COMMUNITY)
Admission: RE | Disposition: A | Discharge status: Home / Self Care | Payer: Self-pay | Source: Ambulatory Visit | Attending: Surgery

## 2020-08-25 DIAGNOSIS — K4090 Unilateral inguinal hernia, without obstruction or gangrene, not specified as recurrent: Secondary | ICD-10-CM | POA: Diagnosis not present

## 2020-08-25 DIAGNOSIS — Z7901 Long term (current) use of anticoagulants: Secondary | ICD-10-CM | POA: Insufficient documentation

## 2020-08-25 DIAGNOSIS — I482 Chronic atrial fibrillation, unspecified: Secondary | ICD-10-CM | POA: Insufficient documentation

## 2020-08-25 DIAGNOSIS — Z79899 Other long term (current) drug therapy: Secondary | ICD-10-CM | POA: Diagnosis not present

## 2020-08-25 DIAGNOSIS — E785 Hyperlipidemia, unspecified: Secondary | ICD-10-CM | POA: Diagnosis not present

## 2020-08-25 DIAGNOSIS — Z87891 Personal history of nicotine dependence: Secondary | ICD-10-CM | POA: Insufficient documentation

## 2020-08-25 DIAGNOSIS — I10 Essential (primary) hypertension: Secondary | ICD-10-CM | POA: Diagnosis not present

## 2020-08-25 DIAGNOSIS — E119 Type 2 diabetes mellitus without complications: Secondary | ICD-10-CM | POA: Diagnosis not present

## 2020-08-25 HISTORY — PX: INGUINAL HERNIA REPAIR: SHX194

## 2020-08-25 LAB — GLUCOSE, CAPILLARY: Glucose-Capillary: 98 mg/dL (ref 70–99)

## 2020-08-25 SURGERY — REPAIR, HERNIA, INGUINAL, ADULT
Anesthesia: General | Laterality: Left

## 2020-08-25 MED ORDER — EPHEDRINE SULFATE-NACL 50-0.9 MG/10ML-% IV SOSY
PREFILLED_SYRINGE | INTRAVENOUS | Status: DC | PRN
  Administered 2020-08-25: 10 mg via INTRAVENOUS
  Administered 2020-08-25: 5 mg via INTRAVENOUS

## 2020-08-25 MED ORDER — FENTANYL CITRATE (PF) 250 MCG/5ML IJ SOLN
INTRAMUSCULAR | Status: DC | PRN
  Administered 2020-08-25 (×5): 50 ug via INTRAVENOUS

## 2020-08-25 MED ORDER — GLYCOPYRROLATE PF 0.2 MG/ML IJ SOSY
PREFILLED_SYRINGE | INTRAMUSCULAR | Status: AC
  Filled 2020-08-25: qty 1

## 2020-08-25 MED ORDER — FENTANYL CITRATE (PF) 250 MCG/5ML IJ SOLN
INTRAMUSCULAR | Status: AC
  Filled 2020-08-25: qty 5

## 2020-08-25 MED ORDER — PROPOFOL 10 MG/ML IV BOLUS
INTRAVENOUS | Status: AC
  Filled 2020-08-25: qty 20

## 2020-08-25 MED ORDER — SCOPOLAMINE 1 MG/3DAYS TD PT72: 1.0000 | MEDICATED_PATCH | TRANSDERMAL | Status: DC

## 2020-08-25 MED ORDER — PHENYLEPHRINE 40 MCG/ML (10ML) SYRINGE FOR IV PUSH (FOR BLOOD PRESSURE SUPPORT)
PREFILLED_SYRINGE | INTRAVENOUS | Status: AC
  Filled 2020-08-25: qty 10

## 2020-08-25 MED ORDER — SODIUM CHLORIDE (PF) 0.9 % IJ SOLN
INTRAMUSCULAR | Status: AC
  Filled 2020-08-25: qty 10

## 2020-08-25 MED ORDER — FENTANYL CITRATE (PF) 100 MCG/2ML IJ SOLN: 25.0000 ug | INTRAMUSCULAR | Status: DC | PRN

## 2020-08-25 MED ORDER — ONDANSETRON HCL 4 MG/2ML IJ SOLN
INTRAMUSCULAR | Status: AC
  Filled 2020-08-25: qty 2

## 2020-08-25 MED ORDER — CHLORHEXIDINE GLUCONATE CLOTH 2 % EX PADS: 6.0000 | MEDICATED_PAD | Freq: Once | CUTANEOUS | Status: DC

## 2020-08-25 MED ORDER — ROCURONIUM BROMIDE 10 MG/ML (PF) SYRINGE
PREFILLED_SYRINGE | INTRAVENOUS | Status: AC
  Filled 2020-08-25: qty 10

## 2020-08-25 MED ORDER — LIDOCAINE HCL (CARDIAC) PF 100 MG/5ML IV SOSY
PREFILLED_SYRINGE | INTRAVENOUS | Status: DC | PRN
  Administered 2020-08-25: 100 mg via INTRAVENOUS

## 2020-08-25 MED ORDER — ONDANSETRON HCL 4 MG/2ML IJ SOLN: 4.0000 mg | Freq: Once | INTRAMUSCULAR | Status: DC | PRN

## 2020-08-25 MED ORDER — CHLORHEXIDINE GLUCONATE 0.12 % MT SOLN
15.0000 mL | Freq: Once | OROMUCOSAL | Status: AC
  Administered 2020-08-25: 15 mL via OROMUCOSAL

## 2020-08-25 MED ORDER — LIDOCAINE HCL (PF) 2 % IJ SOLN
INTRAMUSCULAR | Status: AC
  Filled 2020-08-25: qty 5

## 2020-08-25 MED ORDER — OXYCODONE HCL 5 MG PO TABS: 5.0000 mg | ORAL_TABLET | Freq: Once | ORAL | Status: DC | PRN

## 2020-08-25 MED ORDER — ORAL CARE MOUTH RINSE: 15.0000 mL | Freq: Once | OROMUCOSAL | Status: AC

## 2020-08-25 MED ORDER — DEXAMETHASONE SODIUM PHOSPHATE 10 MG/ML IJ SOLN
INTRAMUSCULAR | Status: DC | PRN
  Administered 2020-08-25: 8 mg via INTRAVENOUS

## 2020-08-25 MED ORDER — OXYCODONE HCL 5 MG/5ML PO SOLN: 5.0000 mg | Freq: Once | ORAL | Status: DC | PRN

## 2020-08-25 MED ORDER — ACETAMINOPHEN 325 MG PO TABS: 325.0000 mg | ORAL_TABLET | ORAL | Status: DC | PRN

## 2020-08-25 MED ORDER — LACTATED RINGERS IV SOLN: INTRAVENOUS | Status: DC

## 2020-08-25 MED ORDER — HYDROCODONE-ACETAMINOPHEN 5-325 MG PO TABS: 1.0000 | ORAL_TABLET | Freq: Four times a day (QID) | ORAL | 0 refills | Status: DC | PRN

## 2020-08-25 MED ORDER — BUPIVACAINE LIPOSOME 1.3 % IJ SUSP
INTRAMUSCULAR | Status: DC | PRN
  Administered 2020-08-25: 20 mL

## 2020-08-25 MED ORDER — CEFAZOLIN SODIUM-DEXTROSE 2-4 GM/100ML-% IV SOLN
2.0000 g | INTRAVENOUS | Status: AC
  Administered 2020-08-25: 2 g via INTRAVENOUS
  Filled 2020-08-25: qty 100

## 2020-08-25 MED ORDER — PHENYLEPHRINE 40 MCG/ML (10ML) SYRINGE FOR IV PUSH (FOR BLOOD PRESSURE SUPPORT)
PREFILLED_SYRINGE | INTRAVENOUS | Status: DC | PRN
  Administered 2020-08-25: 80 ug via INTRAVENOUS

## 2020-08-25 MED ORDER — DEXAMETHASONE SODIUM PHOSPHATE 10 MG/ML IJ SOLN
INTRAMUSCULAR | Status: AC
  Filled 2020-08-25: qty 1

## 2020-08-25 MED ORDER — PROPOFOL 10 MG/ML IV BOLUS
INTRAVENOUS | Status: DC | PRN
  Administered 2020-08-25: 200 mg via INTRAVENOUS

## 2020-08-25 MED ORDER — MEPERIDINE HCL 50 MG/ML IJ SOLN: 6.2500 mg | INTRAMUSCULAR | Status: DC | PRN

## 2020-08-25 MED ORDER — ACETAMINOPHEN 160 MG/5ML PO SOLN: 325.0000 mg | ORAL | Status: DC | PRN

## 2020-08-25 MED ORDER — GLYCOPYRROLATE PF 0.2 MG/ML IJ SOSY
PREFILLED_SYRINGE | INTRAMUSCULAR | Status: DC | PRN
  Administered 2020-08-25: .2 mg via INTRAVENOUS

## 2020-08-25 MED ORDER — 0.9 % SODIUM CHLORIDE (POUR BTL) OPTIME
TOPICAL | Status: DC | PRN
  Administered 2020-08-25: 1000 mL

## 2020-08-25 MED ORDER — ONDANSETRON HCL 4 MG/2ML IJ SOLN
INTRAMUSCULAR | Status: DC | PRN
  Administered 2020-08-25: 4 mg via INTRAVENOUS

## 2020-08-25 SURGICAL SUPPLY — 32 items
ADH SKN CLS APL DERMABOND .7 (GAUZE/BANDAGES/DRESSINGS) ×1
BLADE SURG 15 STRL LF DISP TIS (BLADE) ×1 IMPLANT
BLADE SURG 15 STRL SS (BLADE) ×2
COVER SURGICAL LIGHT HANDLE (MISCELLANEOUS) ×2 IMPLANT
COVER WAND RF STERILE (DRAPES) IMPLANT
DECANTER SPIKE VIAL GLASS SM (MISCELLANEOUS) ×2 IMPLANT
DERMABOND ADVANCED (GAUZE/BANDAGES/DRESSINGS) ×1
DERMABOND ADVANCED .7 DNX12 (GAUZE/BANDAGES/DRESSINGS) IMPLANT
DISSECTOR ROUND CHERRY 3/8 STR (MISCELLANEOUS) IMPLANT
DRAIN PENROSE 0.5X18 (DRAIN) ×2 IMPLANT
DRAPE LAPAROTOMY TRNSV 102X78 (DRAPES) ×2 IMPLANT
ELECT REM PT RETURN 15FT ADLT (MISCELLANEOUS) ×2 IMPLANT
GLOVE BIOGEL M 8.0 STRL (GLOVE) ×2 IMPLANT
GOWN STRL REUS W/TWL XL LVL3 (GOWN DISPOSABLE) ×4 IMPLANT
KIT BASIN OR (CUSTOM PROCEDURE TRAY) ×2 IMPLANT
KIT TURNOVER KIT A (KITS) IMPLANT
MESH HERNIA 3X6 (Mesh General) ×1 IMPLANT
NEEDLE HYPO 22GX1.5 SAFETY (NEEDLE) ×2 IMPLANT
PACK BASIC VI WITH GOWN DISP (CUSTOM PROCEDURE TRAY) ×2 IMPLANT
PENCIL SMOKE EVACUATOR (MISCELLANEOUS) IMPLANT
SPONGE LAP 4X18 RFD (DISPOSABLE) ×2 IMPLANT
STAPLER VISISTAT 35W (STAPLE) IMPLANT
SUT PROLENE 2 0 CT2 30 (SUTURE) ×5 IMPLANT
SUT SILK 2 0 SH (SUTURE) IMPLANT
SUT VIC AB 2-0 SH 27 (SUTURE) ×2
SUT VIC AB 2-0 SH 27X BRD (SUTURE) ×1 IMPLANT
SUT VIC AB 4-0 SH 18 (SUTURE) ×2 IMPLANT
SUT VICRYL 4-0 (SUTURE) ×2 IMPLANT
SYR 20ML LL LF (SYRINGE) ×2 IMPLANT
SYR BULB IRRIG 60ML STRL (SYRINGE) ×2 IMPLANT
TOWEL OR 17X26 10 PK STRL BLUE (TOWEL DISPOSABLE) ×2 IMPLANT
TOWEL OR NON WOVEN STRL DISP B (DISPOSABLE) ×2 IMPLANT

## 2020-08-25 NOTE — Interval H&P Note (Signed)
History and Physical Interval Note:  08/25/2020 3:00 PM  [name]  has presented today for surgery, with the diagnosis of LEFT INGUINAL HERNIA.  The various methods of treatment have been discussed with the patient and family. After consideration of risks, benefits and other options for treatment, the patient has consented to  Procedure(s): OPEN LEFT INGUINAL HERNIA REPAIR WITH MESH (Left) as a surgical intervention.  The patient's history has been reviewed, patient examined, no change in status, stable for surgery.  I have reviewed the patient's chart and labs.  Questions were answered to the patient's satisfaction.     Mike Day

## 2020-08-25 NOTE — Discharge Instructions (Signed)
° °Inguinal Hernia, Adult °An inguinal hernia is when fat or your intestines push through a weak spot in a muscle where your leg meets your lower belly (groin). This causes a rounded lump (bulge). This kind of hernia could also be: °· In your scrotum, if you are male. °· In folds of skin around your vagina, if you are male. °There are three types of inguinal hernias. These include: °· Hernias that can be pushed back into the belly (are reducible). This type rarely causes pain. °· Hernias that cannot be pushed back into the belly (are incarcerated). °· Hernias that cannot be pushed back into the belly and lose their blood supply (are strangulated). This type needs emergency surgery. °If you do not have symptoms, you may not need treatment. If you have symptoms or a large hernia, you may need surgery. °Follow these instructions at home: °Lifestyle °· Do these things if told by your doctor so you do not have trouble pooping (constipation): °? Drink enough fluid to keep your pee (urine) pale yellow. °? Eat foods that have a lot of fiber. These include fresh fruits and vegetables, whole grains, and beans. °? Limit foods that are high in fat and processed sugars. These include foods that are fried or sweet. °? Take medicine for trouble pooping. °· Avoid lifting heavy objects. °· Avoid standing for long amounts of time. °· Do not use any products that contain nicotine or tobacco. These include cigarettes and e-cigarettes. If you need help quitting, ask your doctor. °· Stay at a healthy weight. °General instructions °· You may try to push your hernia in by very gently pressing on it when you are lying down. Do not try to force the bulge back in if it will not push in easily. °· Watch your hernia for any changes in shape, size, or color. Tell your doctor if you see any changes. °· Take over-the-counter and prescription medicines only as told by your doctor. °· Keep all follow-up visits as told by your doctor. This is  important. °Contact a doctor if: °· You have a fever. °· You have new symptoms. °· Your symptoms get worse. °Get help right away if: °· The area where your leg meets your lower belly has: °? Pain that gets worse suddenly. °? A bulge that gets bigger suddenly, and it does not get smaller after that. °? A bulge that turns red or purple. °? A bulge that is painful when you touch it. °· You are a man, and your scrotum: °? Suddenly feels painful. °? Suddenly changes in size. °· You cannot push the hernia in by very gently pressing on it when you are lying down. Do not try to force the bulge back in if it will not push in easily. °· You feel sick to your stomach (nauseous), and that feeling does not go away. °· You throw up (vomit), and that keeps happening. °· You have a fast heartbeat. °· You cannot poop (have a bowel movement) or pass gas. °These symptoms may be an emergency. Do not wait to see if the symptoms will go away. Get medical help right away. Call your local emergency services (911 in the [place] °Summary °· An inguinal hernia is when fat or your intestines push through a weak spot in a muscle where your leg meets your lower belly (groin). This causes a rounded lump (bulge). °· If you do not have symptoms, you may not need treatment. If you have symptoms or a large hernia,   you may need surgery. °· Avoid lifting heavy objects. Also avoid standing for long amounts of time. °· Do not try to force the bulge back in if it will not push in easily. °This information is not intended to replace advice given to you by your health care provider. Make sure you discuss any questions you have with your health care provider. °Document Revised: 09/23/2017 Document Reviewed: 05/24/2017 °Elsevier Patient Education © 2020 Elsevier Inc. ° °

## 2020-08-25 NOTE — Anesthesia Procedure Notes (Signed)
Procedure Name: LMA Insertion Date/Time: 08/25/2020 3:18 PM Performed by: Claudia Desanctis, CRNA Pre-anesthesia Checklist: Patient identified, Emergency Drugs available, Suction available and Patient being monitored Patient Re-evaluated:Patient Re-evaluated prior to induction Oxygen Delivery Method: Circle system utilized Preoxygenation: Pre-oxygenation with 100% oxygen Induction Type: IV induction Ventilation: Mask ventilation without difficulty LMA: LMA with gastric port inserted LMA Size: 4.0 Number of attempts: 1 Airway Equipment and Method: Stylet Placement Confirmation: ETT inserted through vocal cords under direct vision,  positive ETCO2 and breath sounds checked- equal and bilateral Tube secured with: Tape Dental Injury: Teeth and Oropharynx as per pre-operative assessment

## 2020-08-25 NOTE — Transfer of Care (Signed)
Immediate Anesthesia Transfer of Care Note  Patient: Mike Day  Procedure(s) Performed: OPEN LEFT INGUINAL HERNIA REPAIR WITH MESH (Left )  Patient Location: PACU  Anesthesia Type:General  Level of Consciousness: awake and patient cooperative  Airway & Oxygen Therapy: Patient Spontanous Breathing and Patient connected to face mask  Post-op Assessment: Report given to RN and Post -op Vital signs reviewed and stable  Post vital signs: Reviewed and stable  Last Vitals:  Vitals Value Taken Time  BP 159/99 08/25/20 1652  Temp    Pulse 80 08/25/20 1656  Resp 23 08/25/20 1656  SpO2 100 % 08/25/20 1656  Vitals shown include unvalidated device data.  Last Pain:  Vitals:   08/25/20 1259  TempSrc:   PainSc: 0-No pain      Patients Stated Pain Goal: 3 (38/46/65 9935)  Complications: No complications documented.

## 2020-08-25 NOTE — Anesthesia Postprocedure Evaluation (Signed)
Anesthesia Post Note  Patient: Mike Day  Procedure(s) Performed: OPEN LEFT INGUINAL HERNIA REPAIR WITH MESH (Left )     Patient location during evaluation: PACU Anesthesia Type: General Level of consciousness: awake Pain management: pain level controlled Vital Signs Assessment: post-procedure vital signs reviewed and stable Respiratory status: spontaneous breathing Cardiovascular status: stable Postop Assessment: no apparent nausea or vomiting Anesthetic complications: no   No complications documented.  Last Vitals:  Vitals:   08/25/20 1730 08/25/20 1745  BP: (!) 170/110 (!) 163/93  Pulse: 68 70  Resp: 20 20  Temp: 36.7 C   SpO2: 98% 97%    Last Pain:  Vitals:   08/25/20 1745  TempSrc:   PainSc: 0-No pain                 Huston Foley

## 2020-08-25 NOTE — Op Note (Signed)
Mike Day  25-Apr-1946   08/25/2020    PCP:  Ma Hillock, DO   Surgeon: Kaylyn Lim, MD, FACS  Asst:  none  Anes:  General by LMA  Preop Dx: Left inguinal hernia Postop Dx: Left direct inguinal hernia  Procedure: Open left inguinal hernia with mesh Location Surgery: [facility] 1 Complications: None noted  EBL:   minimal cc  Drains: none  Description of Procedure:  The patient was taken to OR 1 .  After anesthesia was administered and the patient was prepped  with [employer]  and a timeout was performed.  An oblique incision was made from the external ring laterally.  A 1.5 cm in diameter direct hernia was found and dissected from the cord structures.  It was reduced and the defect closed with 2-0  Prolene.  The hernia was then repaired with a piece of marlex type mesh cut to fit and secured with interrupted 2-0 prolene.  The mesh was cut and brought around the cord and sutured to itself.  It was tucked beneath the external oblique.  The external oblique was closed with running 2-0 vicryl.  4-0 vicryl in the subcut and then running 4-0 Monocryl and Dermabond.    The patient tolerated the procedure well and was taken to the PACU in stable condition.     Matt B. Hassell Done, [place], [facility], [place]

## 2020-08-26 ENCOUNTER — Encounter (HOSPITAL_COMMUNITY): Payer: Self-pay | Admitting: Surgery

## 2020-09-04 DIAGNOSIS — H25812 Combined forms of age-related cataract, left eye: Secondary | ICD-10-CM | POA: Diagnosis not present

## 2020-09-04 DIAGNOSIS — H2181 Floppy iris syndrome: Secondary | ICD-10-CM | POA: Diagnosis not present

## 2020-09-16 DIAGNOSIS — H2511 Age-related nuclear cataract, right eye: Secondary | ICD-10-CM | POA: Diagnosis not present

## 2020-09-17 DIAGNOSIS — N529 Male erectile dysfunction, unspecified: Secondary | ICD-10-CM | POA: Diagnosis not present

## 2020-09-17 DIAGNOSIS — C61 Malignant neoplasm of prostate: Secondary | ICD-10-CM | POA: Diagnosis not present

## 2020-09-18 DIAGNOSIS — H25811 Combined forms of age-related cataract, right eye: Secondary | ICD-10-CM | POA: Diagnosis not present

## 2020-09-18 DIAGNOSIS — H21561 Pupillary abnormality, right eye: Secondary | ICD-10-CM | POA: Diagnosis not present

## 2020-09-18 DIAGNOSIS — H2181 Floppy iris syndrome: Secondary | ICD-10-CM | POA: Diagnosis not present

## 2020-10-29 DIAGNOSIS — H16221 Keratoconjunctivitis sicca, not specified as Sjogren's, right eye: Secondary | ICD-10-CM | POA: Diagnosis not present

## 2020-10-29 DIAGNOSIS — H0011 Chalazion right upper eyelid: Secondary | ICD-10-CM | POA: Diagnosis not present

## 2020-11-25 DIAGNOSIS — K4030 Unilateral inguinal hernia, with obstruction, without gangrene, not specified as recurrent: Secondary | ICD-10-CM | POA: Diagnosis not present

## 2020-12-04 HISTORY — PX: MOHS SURGERY: SUR867

## 2020-12-14 ENCOUNTER — Other Ambulatory Visit: Payer: Self-pay | Admitting: Pharmacist

## 2020-12-14 MED ORDER — ELIQUIS 5 MG PO TABS: 1.0000 | ORAL_TABLET | Freq: Two times a day (BID) | ORAL | 1 refills | Status: DC

## 2020-12-14 NOTE — Telephone Encounter (Signed)
Prescription refill request for Eliquis received. Indication:afib Last office visit:08/21/20 Scr:0.74 Age: 75 Weight:85.7kg

## 2020-12-29 ENCOUNTER — Other Ambulatory Visit: Payer: Self-pay | Admitting: Cardiovascular Disease

## 2020-12-29 DIAGNOSIS — Z23 Encounter for immunization: Secondary | ICD-10-CM | POA: Diagnosis not present

## 2020-12-31 ENCOUNTER — Ambulatory Visit: Payer: Self-pay | Admitting: Surgery

## 2020-12-31 DIAGNOSIS — H0102B Squamous blepharitis left eye, upper and lower eyelids: Secondary | ICD-10-CM | POA: Diagnosis not present

## 2020-12-31 DIAGNOSIS — H16221 Keratoconjunctivitis sicca, not specified as Sjogren's, right eye: Secondary | ICD-10-CM | POA: Diagnosis not present

## 2020-12-31 DIAGNOSIS — H0102A Squamous blepharitis right eye, upper and lower eyelids: Secondary | ICD-10-CM | POA: Diagnosis not present

## 2020-12-31 NOTE — Patient Instructions (Addendum)
DUE TO COVID-19 ONLY ONE VISITOR IS ALLOWED TO COME WITH YOU AND STAY IN THE WAITING ROOM ONLY DURING PRE OP AND PROCEDURE DAY OF SURGERY. THE 1 VISITOR  MAY VISIT WITH YOU AFTER SURGERY IN YOUR PRIVATE ROOM DURING VISITING HOURS ONLY!  YOU NEED TO HAVE A COVID 19 TEST ON__5/5_____ @__2 :15 pm_____, THIS TEST MUST BE DONE BEFORE SURGERY,  COVID TESTING SITE [place] [id number], IT IS ON THE RIGHT GOING OUT [address] APPROXIMATELY  2 MINUTES PAST [employer] ON THE RIGHT. ONCE YOUR COVID TEST IS COMPLETED,  PLEASE BEGIN THE QUARANTINE INSTRUCTIONS AS OUTLINED IN YOUR HANDOUT.                Mike Day   Your procedure is scheduled on: 01/11/21   Report to [facility] Main  Entrance   Report to Short Stay at 5:15 AM     Call this number if you have problems the morning of surgery [phone]    Remember: Do not eat food or drink liquids :After Midnight.   BRUSH YOUR TEETH MORNING OF SURGERY AND RINSE YOUR MOUTH OUT, NO CHEWING GUM CANDY OR MINTS.     Take these medicines the morning of surgery with A SIP OF WATER: Amlodipine                                 You may not have any metal on your body including              piercings  Do not wear jewelry,  lotions, powders or deodorant              Men may shave face and neck.   Do not bring valuables to the hospital. [place].  Contacts, dentures or bridgework may not be worn into surgery.      Patients discharged the day of surgery will not be allowed to drive home.   IF YOU ARE HAVING SURGERY AND GOING HOME THE SAME DAY, YOU MUST HAVE AN ADULT TO DRIVE YOU HOME AND BE WITH YOU FOR 24 HOURS.  YOU MAY GO HOME BY TAXI OR UBER OR ORTHERWISE, BUT AN ADULT MUST ACCOMPANY YOU HOME AND STAY WITH YOU FOR 24 HOURS.  Name and phone number of your driver:  Special Instructions: N/A              Please read over the following fact sheets you  were given: _____________________________________________________________________             [facility] - Preparing for Surgery Before surgery, you can play an important role.  Because skin is not sterile, your skin needs to be as free of germs as possible.  You can reduce the number of germs on your skin by washing with CHG (chlorahexidine gluconate) soap before surgery.  CHG is an antiseptic cleaner which kills germs and bonds with the skin to continue killing germs even after washing. Please DO NOT use if you have an allergy to CHG or antibacterial soaps.  If your skin becomes reddened/irritated stop using the CHG and inform your nurse when you arrive at Short Stay. .  You may shave your face/neck.  Please follow these instructions carefully:  1.  Shower with CHG Soap the night before surgery and  the  morning of Surgery.  2.  If you choose to wash your hair, wash your hair first as usual with your  normal  shampoo.  3.  After you shampoo, rinse your hair and body thoroughly to remove the  shampoo.                                        4.  Use CHG as you would any other liquid soap.  You can apply chg directly  to the skin and wash                       Gently with a scrungie or clean washcloth.  5.  Apply the CHG Soap to your body ONLY FROM THE NECK DOWN.   Do not use on face/ open                           Wound or open sores. Avoid contact with eyes, ears mouth and genitals (private parts).                       Wash face,  Genitals (private parts) with your normal soap.             6.  Wash thoroughly, paying special attention to the area where your surgery  will be performed.  7.  Thoroughly rinse your body with warm water from the neck down.  8.  DO NOT shower/wash with your normal soap after using and rinsing off  the CHG Soap.             9.  Pat yourself dry with a clean towel.            10.  Wear clean pajamas.            11.  Place clean sheets on your bed the night of your  first shower and do not  sleep with pets. Day of Surgery : Do not apply any lotions/deodorants the morning of surgery.  Please wear clean clothes to the hospital/surgery center.  FAILURE TO FOLLOW THESE INSTRUCTIONS MAY RESULT IN THE CANCELLATION OF YOUR SURGERY PATIENT SIGNATURE_________________________________  NURSE SIGNATURE__________________________________  ________________________________________________________________________

## 2020-12-31 NOTE — Progress Notes (Signed)
Sent message, via epic in basket, requesting orders in epic from surgeon.  

## 2021-01-01 ENCOUNTER — Encounter (HOSPITAL_COMMUNITY)
Admission: RE | Admit: 2021-01-01 | Discharge: 2021-01-01 | Disposition: A | Discharge status: Home / Self Care | Payer: Medicare Other | Source: Ambulatory Visit | Attending: Surgery | Admitting: Surgery

## 2021-01-01 ENCOUNTER — Other Ambulatory Visit: Payer: Self-pay

## 2021-01-01 ENCOUNTER — Encounter (HOSPITAL_COMMUNITY): Payer: Self-pay

## 2021-01-01 DIAGNOSIS — I10 Essential (primary) hypertension: Secondary | ICD-10-CM | POA: Insufficient documentation

## 2021-01-01 DIAGNOSIS — Z01812 Encounter for preprocedural laboratory examination: Secondary | ICD-10-CM | POA: Diagnosis not present

## 2021-01-01 DIAGNOSIS — Z8546 Personal history of malignant neoplasm of prostate: Secondary | ICD-10-CM | POA: Insufficient documentation

## 2021-01-01 DIAGNOSIS — Z7901 Long term (current) use of anticoagulants: Secondary | ICD-10-CM | POA: Insufficient documentation

## 2021-01-01 DIAGNOSIS — Z79899 Other long term (current) drug therapy: Secondary | ICD-10-CM | POA: Diagnosis not present

## 2021-01-01 DIAGNOSIS — Z9079 Acquired absence of other genital organ(s): Secondary | ICD-10-CM | POA: Diagnosis not present

## 2021-01-01 DIAGNOSIS — I4891 Unspecified atrial fibrillation: Secondary | ICD-10-CM | POA: Insufficient documentation

## 2021-01-01 DIAGNOSIS — K4090 Unilateral inguinal hernia, without obstruction or gangrene, not specified as recurrent: Secondary | ICD-10-CM | POA: Insufficient documentation

## 2021-01-01 DIAGNOSIS — Z87891 Personal history of nicotine dependence: Secondary | ICD-10-CM | POA: Diagnosis not present

## 2021-01-01 LAB — CBC
HCT: 41.7 % (ref 39.0–52.0)
Hemoglobin: 14 g/dL (ref 13.0–17.0)
MCH: 33.6 pg (ref 26.0–34.0)
MCHC: 33.6 g/dL (ref 30.0–36.0)
MCV: 100 fL (ref 80.0–100.0)
Platelets: 179 10*3/uL (ref 150–400)
RBC: 4.17 MIL/uL — ABNORMAL LOW (ref 4.22–5.81)
RDW: 13 % (ref 11.5–15.5)
WBC: 6.3 10*3/uL (ref 4.0–10.5)
nRBC: 0 % (ref 0.0–0.2)

## 2021-01-01 LAB — HEMOGLOBIN A1C
Hgb A1c MFr Bld: 6.3 % — ABNORMAL HIGH (ref 4.8–5.6)
Mean Plasma Glucose: 134.11 mg/dL

## 2021-01-01 LAB — BASIC METABOLIC PANEL
Anion gap: 9 (ref 5–15)
BUN: 14 mg/dL (ref 8–23)
CO2: 26 mmol/L (ref 22–32)
Calcium: 9.7 mg/dL (ref 8.9–10.3)
Chloride: 102 mmol/L (ref 98–111)
Creatinine, Ser: 0.72 mg/dL (ref 0.61–1.24)
GFR, Estimated: >60 mL/min (ref >60)
Glucose, Bld: 97 mg/dL (ref 70–99)
Potassium: 5.3 mmol/L — ABNORMAL HIGH (ref 3.5–5.1)
Sodium: 137 mmol/L (ref 135–145)

## 2021-01-01 NOTE — Progress Notes (Signed)
COVID Vaccine Completed:Yes Date COVID Vaccine completed:10/31/19-boosters 07/06/20,12/23/20 COVID vaccine manufacturer: Pfizer      PCP - Howard Pouch Cardiologist - Dr. Ezzie Dural  Chest x-ray - no EKG - 08/21/20-epic Stress Test - 06/08/15-epic ECHO - 04/01/21-epic Cardiac Cath - no Pacemaker/ICD device last checked:NA  Sleep Study - no CPAP -   Fasting Blood Sugar -NA diet controled  Checks Blood Sugar _____ times a day  Blood Thinner Instructions:Eliquis/ Dr. Ezzie Dural Aspirin Instructions:stop 3 days prior Pt will verify with his Dr.  Maryjane Hurter Dose:01/08/21  Anesthesia review:   Patient denies shortness of breath, fever, cough and chest pain at PAT appointment Yes. Pt has no SOB with any activities  Patient verbalized understanding of instructions that were given to them at the PAT appointment. Patient was also instructed that they will need to review over the PAT instructions again at home before surgery.yes

## 2021-01-04 ENCOUNTER — Telehealth: Payer: Self-pay | Admitting: *Deleted

## 2021-01-04 NOTE — Telephone Encounter (Signed)
Called pt in response to his mychart message. Pt has made me aware he has contacted his surgeon's office to have them fax the clearance request over.  Pt was frustrated that he had called the office on Friday and was put on hold for 30 minutes.  I did apologize for the inconvenience.

## 2021-01-04 NOTE — Progress Notes (Signed)
Anesthesia Chart Review   Case: [id number] Date/Time: 01/11/21 0715   Procedure: XI ROBOTIC ASSISTED  RIGHT INGUINAL HERNIA REPAIR WITH MESH (Right )   Anesthesia type: General   Pre-op diagnosis: RIGHT INGUINAL HERNIA   Location: [facility] / [facility]   Surgeons: Johnathan Hausen, MD      DISCUSSION:75 y.o. former smoker with h/o HTN, a-fib (on Eliquis), prostate cancer s/p prostatectomy, right inguinal hernia scheduled for above procedure 01/11/2021 with Dr. Johnathan Hausen.   Per cardiology preoperative evaluation 01/04/2021, "Chart reviewed as part of pre-operative protocol coverage. Patient was contacted 01/04/2021 in reference to pre-operative risk assessment for pending surgery as outlined below.  Mike Day was last seen on 08/21/2020 by [name] PA-C.  Since that day, Mike Day has done well without exertional chest pain or worsening dyspnea.   Therefore, based on ACC/AHA guidelines, the patient would be at acceptable risk for the planned procedure without further cardiovascular testing.   Patient may hold Eliquis for 2 to 3 days prior to the procedure and restart as soon as possible afterward at the surgeon's discretion."  Anticipate pt can proceed with planned procedure barring acute status change.   VS: BP 125/69   Pulse (!) 53   Temp 36.8 C (Oral)   Resp 20   Ht _0  (1.905 m)   Wt 83.9 kg   SpO2 99%   BMI 23.12 kg/m   PROVIDERS: Ma Hillock, DO is PCP   Sherren Mocha, MD is Cardiologist  LABS: Labs reviewed: Acceptable for surgery. (all labs ordered are listed, but only abnormal results are displayed)  Labs Reviewed  HEMOGLOBIN A1C - Abnormal; Notable for the following components:      Result Value   Hgb A1c MFr Bld 6.3 (*)    All other components within normal limits  BASIC METABOLIC PANEL - Abnormal; Notable for the following components:   Potassium 5.3 (*)    All other components within normal limits  CBC - Abnormal; Notable for the following  components:   RBC 4.17 (*)    All other components within normal limits     IMAGES:   EKG: 08/21/2020 Rate 59 bpm  Af-bi w/sr  CV: Echo 04/02/2019 1. The left ventricle has normal systolic function with an ejection  fraction of 60-65%. The cavity size was normal. There is severe asymmetric  left ventricular hypertrophy. Left ventricular diastolic function could  not be evaluated secondary to atrial  fibrillation.  2. The right ventricle has normal systolic function. The cavity was  mildly enlarged. There is no increase in right ventricular wall thickness.  Right ventricular systolic pressure is normal with an estimated pressure  of 24.6 mmHg.  3. Left atrial size was moderately dilated.  4. Right atrial size was moderately dilated.  5. The mitral valve is abnormal. There is moderate mitral annular  calcification present.  6. The aortic valve is abnormal. Moderate calcification primarily of the  noncoronary cusp of the aortic valve. No stenosis of the aortic valve.  7. 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 Past Medical History:  Diagnosis Date  . Anemia    Early 20's  . Atrial fibrillation ([place]) 10/07/2014   3-14 day cardiac monitor 03/2019:  AFib, Avg HR 69, no significant pauses, PVCs, wide complex runs (aberrancy vs NSVT - longest 32 beats).  . Atrial flutter [facility]) 2008   Status post RFCA Dr Cristopher Peru 2008  . Carotid artery plaque  Carotid US 8/21: Bilat ICA 1-39  . Chronic atrial fibrillation (HCC)    Eliquis  . Dysrhythmia    a fib   . Echocardiograms    Echo 12/14 - Severe basal septal hypertrophy, moderate LVH, EF 60-65%, moderate LAE, mild RAE // Echo 7/20: EF 60-65, severe asymmetric LVH, mild RVE, normal RV SF, moderate BAE, moderate MAC, moderate aortic valve calcification (noncoronary cusp), mild dilation of ascending aorta (41 mm)  . Hx of adenomatous colonic polyps   . Hyperlipidemia   . Hypertension   . Malignant  neoplasm of prostate ([place]) 05/18/2012  . Neuromuscular disorder (HCC)    neuropathy feet  . Patellar sleeve fracture of left knee 2021  . Prostate cancer [facility]) 2006   Prostatectomy  . Shingles 01/2019   left side of face/eye/neck/ear     Past Surgical History:  Procedure Laterality Date  . BLADDER SUSPENSION  2010  . COLONOSCOPY W/ POLYPECTOMY  2003;2010;04/2014   Tubular adenoma: recall 5 yrs (after 04/2019)  . EYE SURGERY Bilateral 2021,2022  . HERNIA REPAIR  07/2008   Dr.Martin  . INGUINAL HERNIA REPAIR Left 08/25/2020   Procedure: OPEN LEFT INGUINAL HERNIA REPAIR WITH MESH;  Surgeon: Johnathan Hausen, MD;  Location: [facility];  Service: General;  Laterality: Left;  . MOHS SURGERY     left hand  . 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 RADIOFREQUENCY ABLATION  01/15/2007   for ectopic atrial foci  . SEPTOPLASTY  Age 40  . TRANSTHORACIC ECHOCARDIOGRAM  09/02/2013   Normal.  EF 60%.  LAE and RAE.    MEDICATIONS: . acetaminophen (TYLENOL) 500 MG tablet  . amLODipine (NORVASC) 2.5 MG tablet  . apixaban (ELIQUIS) 5 MG TABS tablet  . ARTIFICIAL TEAR SOLUTION OP  . COVID-19 mRNA vaccine, Pfizer, 30 MCG/0.3ML injection  . Emollient (CETAPHIL) cream  . HYDROcodone-acetaminophen (NORCO/VICODIN) 5-325 MG tablet  . lisinopril (ZESTRIL) 20 MG tablet  . simvastatin (ZOCOR) 20 MG tablet  . triamcinolone (NASACORT) 55 MCG/ACT AERO nasal inhaler   No current facility-administered medications for this encounter.     Konrad Felix, PA-C [facility]-Surgical Testing [phone]

## 2021-01-04 NOTE — Telephone Encounter (Signed)
   Name: Mike Day  DOB: Jan 31, 1946  MRN: [id number]   Primary Cardiologist: Sherren Mocha, MD  Chart reviewed as part of pre-operative protocol coverage. Patient was contacted 01/04/2021 in reference to pre-operative risk assessment for pending surgery as outlined below.  Mike Day was last seen on 08/21/2020 by [name] PA-C.  Since that day, Mike Day has done well without exertional chest pain or worsening dyspnea.   Therefore, based on ACC/AHA guidelines, the patient would be at acceptable risk for the planned procedure without further cardiovascular testing.   Patient may hold Eliquis for 2 to 3 days prior to the procedure and restart as soon as possible afterward at the surgeon's discretion.  The patient was advised that if he develops new symptoms prior to surgery to contact our office to arrange for a follow-up visit, and he verbalized understanding.  I will route this recommendation to the requesting party via Epic fax function and remove from pre-op pool. Please call with questions.  [place], [place] 01/04/2021, 11:45 AM

## 2021-01-04 NOTE — Telephone Encounter (Signed)
   [facility] Pre-operative Risk Assessment    Patient Name: MONTEZ CUDA  DOB: 11-Oct-1945  MRN: [id number]   HEARTCARE STAFF: - Please ensure there is not already an duplicate clearance open for this procedure. - Under Visit Info/Reason for Call, type in Other and utilize the format Clearance MM/DD/YY or Clearance TBD. Do not use dashes or single digits. - If request is for dental extraction, please clarify the # of teeth to be extracted.  Request for surgical clearance:  1. What type of surgery is being performed?  ROBOTIC SURGERY / HERNIA   2. When is this surgery scheduled? 01/11/2021   3. What type of clearance is required (medical clearance vs. Pharmacy clearance to hold med vs. Both)?  BOTH  4. Are there any medications that need to be held prior to surgery and how long? ELIQUIS   5. Practice name and name of physician performing surgery?  [facility] / DR. MARTIN   6. What is the office phone number?  [phone]   7.   What is the office fax number?  [phone]  8.   Anesthesia type (None, local, MAC, general) ? GENERAL   Jeanann Lewandowsky 01/04/2021, 11:10 AM  _________________________________________________________________   (provider comments below)

## 2021-01-07 ENCOUNTER — Other Ambulatory Visit (HOSPITAL_COMMUNITY)
Admission: RE | Admit: 2021-01-07 | Discharge: 2021-01-07 | Disposition: A | Discharge status: Home / Self Care | Payer: Medicare Other | Source: Ambulatory Visit | Attending: Surgery | Admitting: Surgery

## 2021-01-07 DIAGNOSIS — Z20822 Contact with and (suspected) exposure to covid-19: Secondary | ICD-10-CM | POA: Insufficient documentation

## 2021-01-07 DIAGNOSIS — Z01812 Encounter for preprocedural laboratory examination: Secondary | ICD-10-CM | POA: Insufficient documentation

## 2021-01-08 LAB — SARS CORONAVIRUS 2 (TAT 6-24 HRS): SARS Coronavirus 2: NEGATIVE

## 2021-01-10 MED ORDER — BUPIVACAINE LIPOSOME 1.3 % IJ SUSP
20.0000 mL | INTRAMUSCULAR | Status: DC
  Filled 2021-01-10: qty 20

## 2021-01-10 NOTE — Anesthesia Preprocedure Evaluation (Addendum)
Anesthesia Evaluation  Patient identified by MRN, date of birth, ID band Patient awake    Reviewed: Allergy & Precautions, NPO status , Patient's Chart, lab work & pertinent test results  Airway Mallampati: II  TM Distance: >3 FB Neck ROM: Full    Dental no notable dental hx.    Pulmonary former smoker,    Pulmonary exam normal breath sounds clear to auscultation       Cardiovascular Exercise Tolerance: Good hypertension, Normal cardiovascular exam+ dysrhythmias (on Eliquis) Atrial Fibrillation  Rhythm:Regular Rate:Normal     Neuro/Psych  Neuromuscular disease (neuropathy)    GI/Hepatic negative GI ROS, Neg liver ROS,   Endo/Other  diabetes  Renal/GU negative Renal ROS  negative genitourinary   Musculoskeletal negative musculoskeletal ROS (+)   Abdominal   Peds negative pediatric ROS (+)  Hematology negative hematology ROS (+)   Anesthesia Other Findings H/o prostate cancer  Reproductive/Obstetrics negative OB ROS                            Anesthesia Physical Anesthesia Plan  ASA: III  Anesthesia Plan: General   Post-op Pain Management:    Induction: Intravenous  PONV Risk Score and Plan: 2  Airway Management Planned: Oral ETT  Additional Equipment: None  Intra-op Plan:   Post-operative Plan: Extubation in OR  Informed Consent: I have reviewed the patients History and Physical, chart, labs and discussed the procedure including the risks, benefits and alternatives for the proposed anesthesia with the patient or authorized representative who has indicated his/her understanding and acceptance.     Dental advisory given  Plan Discussed with: Anesthesiologist and CRNA  Anesthesia Plan Comments:        Anesthesia Quick Evaluation

## 2021-01-11 ENCOUNTER — Ambulatory Visit (HOSPITAL_COMMUNITY): Payer: Medicare Other | Admitting: Physician Assistant

## 2021-01-11 ENCOUNTER — Encounter (HOSPITAL_COMMUNITY): Payer: Self-pay | Admitting: Surgery

## 2021-01-11 ENCOUNTER — Ambulatory Visit (HOSPITAL_COMMUNITY)
Admission: RE | Admit: 2021-01-11 | Discharge: 2021-01-11 | Disposition: A | Discharge status: Home / Self Care | Payer: Medicare Other | Source: Ambulatory Visit | Attending: Surgery | Admitting: Surgery

## 2021-01-11 ENCOUNTER — Encounter (HOSPITAL_COMMUNITY)
Admission: RE | Disposition: A | Discharge status: Home / Self Care | Payer: Self-pay | Source: Ambulatory Visit | Attending: Surgery

## 2021-01-11 ENCOUNTER — Ambulatory Visit (HOSPITAL_COMMUNITY): Payer: Medicare Other | Admitting: Anesthesiology

## 2021-01-11 DIAGNOSIS — E875 Hyperkalemia: Secondary | ICD-10-CM | POA: Diagnosis not present

## 2021-01-11 DIAGNOSIS — E785 Hyperlipidemia, unspecified: Secondary | ICD-10-CM | POA: Diagnosis not present

## 2021-01-11 DIAGNOSIS — Z87891 Personal history of nicotine dependence: Secondary | ICD-10-CM | POA: Insufficient documentation

## 2021-01-11 DIAGNOSIS — Z9079 Acquired absence of other genital organ(s): Secondary | ICD-10-CM | POA: Insufficient documentation

## 2021-01-11 DIAGNOSIS — Z8719 Personal history of other diseases of the digestive system: Secondary | ICD-10-CM | POA: Insufficient documentation

## 2021-01-11 DIAGNOSIS — K660 Peritoneal adhesions (postprocedural) (postinfection): Secondary | ICD-10-CM | POA: Diagnosis not present

## 2021-01-11 DIAGNOSIS — I482 Chronic atrial fibrillation, unspecified: Secondary | ICD-10-CM | POA: Diagnosis not present

## 2021-01-11 DIAGNOSIS — M7989 Other specified soft tissue disorders: Secondary | ICD-10-CM | POA: Diagnosis not present

## 2021-01-11 DIAGNOSIS — K4030 Unilateral inguinal hernia, with obstruction, without gangrene, not specified as recurrent: Secondary | ICD-10-CM | POA: Diagnosis not present

## 2021-01-11 DIAGNOSIS — Z8546 Personal history of malignant neoplasm of prostate: Secondary | ICD-10-CM | POA: Insufficient documentation

## 2021-01-11 HISTORY — PX: XI ROBOTIC ASSISTED INGUINAL HERNIA REPAIR WITH MESH: SHX6706

## 2021-01-11 SURGERY — REPAIR, HERNIA, INGUINAL, ROBOT-ASSISTED, LAPAROSCOPIC, USING MESH
Anesthesia: General | Site: Abdomen | Laterality: Right

## 2021-01-11 MED ORDER — FENTANYL CITRATE (PF) 100 MCG/2ML IJ SOLN: 25.0000 ug | INTRAMUSCULAR | Status: DC | PRN

## 2021-01-11 MED ORDER — SODIUM CHLORIDE (PF) 0.9 % IJ SOLN
INTRAMUSCULAR | Status: DC | PRN
  Administered 2021-01-11: 10 mL

## 2021-01-11 MED ORDER — BUPIVACAINE LIPOSOME 1.3 % IJ SUSP
INTRAMUSCULAR | Status: DC | PRN
  Administered 2021-01-11: 20 mL

## 2021-01-11 MED ORDER — CHLORHEXIDINE GLUCONATE CLOTH 2 % EX PADS: 6.0000 | MEDICATED_PAD | Freq: Once | CUTANEOUS | Status: DC

## 2021-01-11 MED ORDER — DROPERIDOL 2.5 MG/ML IJ SOLN: 0.6250 mg | Freq: Once | INTRAMUSCULAR | Status: DC | PRN

## 2021-01-11 MED ORDER — OXYCODONE HCL 5 MG PO TABS: 5.0000 mg | ORAL_TABLET | Freq: Once | ORAL | Status: DC | PRN

## 2021-01-11 MED ORDER — ROCURONIUM BROMIDE 10 MG/ML (PF) SYRINGE
PREFILLED_SYRINGE | INTRAVENOUS | Status: AC
  Filled 2021-01-11: qty 10

## 2021-01-11 MED ORDER — EPHEDRINE 5 MG/ML INJ
INTRAVENOUS | Status: AC
  Filled 2021-01-11: qty 10

## 2021-01-11 MED ORDER — ROCURONIUM BROMIDE 10 MG/ML (PF) SYRINGE
PREFILLED_SYRINGE | INTRAVENOUS | Status: DC | PRN
  Administered 2021-01-11: 20 mg via INTRAVENOUS
  Administered 2021-01-11: 30 mg via INTRAVENOUS
  Administered 2021-01-11: 20 mg via INTRAVENOUS
  Administered 2021-01-11: 70 mg via INTRAVENOUS
  Administered 2021-01-11: 20 mg via INTRAVENOUS
  Administered 2021-01-11: 30 mg via INTRAVENOUS

## 2021-01-11 MED ORDER — SCOPOLAMINE 1 MG/3DAYS TD PT72
1.0000 | MEDICATED_PATCH | TRANSDERMAL | Status: DC
  Administered 2021-01-11: 1.5 mg via TRANSDERMAL
  Filled 2021-01-11: qty 1

## 2021-01-11 MED ORDER — PROPOFOL 10 MG/ML IV BOLUS
INTRAVENOUS | Status: AC
  Filled 2021-01-11: qty 20

## 2021-01-11 MED ORDER — ACETAMINOPHEN 500 MG PO TABS
1000.0000 mg | ORAL_TABLET | ORAL | Status: AC
  Administered 2021-01-11: 1000 mg via ORAL
  Filled 2021-01-11: qty 2

## 2021-01-11 MED ORDER — ONDANSETRON HCL 4 MG/2ML IJ SOLN
INTRAMUSCULAR | Status: DC | PRN
  Administered 2021-01-11: 4 mg via INTRAVENOUS

## 2021-01-11 MED ORDER — HEPARIN SODIUM (PORCINE) 5000 UNIT/ML IJ SOLN
5000.0000 [IU] | Freq: Once | INTRAMUSCULAR | Status: AC
  Administered 2021-01-11: 5000 [IU] via SUBCUTANEOUS
  Filled 2021-01-11: qty 1

## 2021-01-11 MED ORDER — OXYCODONE HCL 5 MG/5ML PO SOLN: 5.0000 mg | Freq: Once | ORAL | Status: DC | PRN

## 2021-01-11 MED ORDER — SODIUM CHLORIDE (PF) 0.9 % IJ SOLN
INTRAMUSCULAR | Status: AC
  Filled 2021-01-11: qty 10

## 2021-01-11 MED ORDER — CEFAZOLIN SODIUM-DEXTROSE 2-4 GM/100ML-% IV SOLN
2.0000 g | INTRAVENOUS | Status: AC
  Administered 2021-01-11 (×2): 2 g via INTRAVENOUS
  Filled 2021-01-11: qty 100

## 2021-01-11 MED ORDER — PROPOFOL 10 MG/ML IV BOLUS
INTRAVENOUS | Status: DC | PRN
  Administered 2021-01-11: 120 mg via INTRAVENOUS

## 2021-01-11 MED ORDER — LACTATED RINGERS IV SOLN: INTRAVENOUS | Status: DC

## 2021-01-11 MED ORDER — LIDOCAINE 2% (20 MG/ML) 5 ML SYRINGE
INTRAMUSCULAR | Status: DC | PRN
  Administered 2021-01-11: 40 mg via INTRAVENOUS

## 2021-01-11 MED ORDER — ORAL CARE MOUTH RINSE: 15.0000 mL | Freq: Once | OROMUCOSAL | Status: AC

## 2021-01-11 MED ORDER — FENTANYL CITRATE (PF) 250 MCG/5ML IJ SOLN
INTRAMUSCULAR | Status: DC | PRN
  Administered 2021-01-11: 100 ug via INTRAVENOUS
  Administered 2021-01-11 (×2): 25 ug via INTRAVENOUS

## 2021-01-11 MED ORDER — DEXAMETHASONE SODIUM PHOSPHATE 10 MG/ML IJ SOLN
INTRAMUSCULAR | Status: DC | PRN
  Administered 2021-01-11: 10 mg via INTRAVENOUS

## 2021-01-11 MED ORDER — SUGAMMADEX SODIUM 200 MG/2ML IV SOLN
INTRAVENOUS | Status: DC | PRN
  Administered 2021-01-11: 200 mg via INTRAVENOUS

## 2021-01-11 MED ORDER — 0.9 % SODIUM CHLORIDE (POUR BTL) OPTIME
TOPICAL | Status: DC | PRN
  Administered 2021-01-11: 1000 mL

## 2021-01-11 MED ORDER — FENTANYL CITRATE (PF) 250 MCG/5ML IJ SOLN
INTRAMUSCULAR | Status: AC
  Filled 2021-01-11: qty 5

## 2021-01-11 MED ORDER — DEXAMETHASONE SODIUM PHOSPHATE 10 MG/ML IJ SOLN
INTRAMUSCULAR | Status: AC
  Filled 2021-01-11: qty 1

## 2021-01-11 MED ORDER — CEFAZOLIN SODIUM-DEXTROSE 2-4 GM/100ML-% IV SOLN
INTRAVENOUS | Status: AC
  Filled 2021-01-11: qty 100

## 2021-01-11 MED ORDER — CHLORHEXIDINE GLUCONATE 0.12 % MT SOLN
15.0000 mL | Freq: Once | OROMUCOSAL | Status: AC
  Administered 2021-01-11: 15 mL via OROMUCOSAL

## 2021-01-11 MED ORDER — EPHEDRINE SULFATE-NACL 50-0.9 MG/10ML-% IV SOSY
PREFILLED_SYRINGE | INTRAVENOUS | Status: DC | PRN
Start: 2021-01-11 — End: 2021-01-11
  Administered 2021-01-11: 5 mg via INTRAVENOUS
  Administered 2021-01-11: 10 mg via INTRAVENOUS
  Administered 2021-01-11: 15 mg via INTRAVENOUS
  Administered 2021-01-11: 10 mg via INTRAVENOUS

## 2021-01-11 MED ORDER — GLYCOPYRROLATE PF 0.2 MG/ML IJ SOSY
PREFILLED_SYRINGE | INTRAMUSCULAR | Status: DC | PRN
  Administered 2021-01-11: .2 mg via INTRAVENOUS

## 2021-01-11 MED ORDER — GLYCOPYRROLATE PF 0.2 MG/ML IJ SOSY
PREFILLED_SYRINGE | INTRAMUSCULAR | Status: AC
  Filled 2021-01-11: qty 1

## 2021-01-11 SURGICAL SUPPLY — 54 items
APL PRP STRL LF DISP 70% ISPRP (MISCELLANEOUS) ×1
APL SWBSTK 6 STRL LF DISP (MISCELLANEOUS) ×2
APPLICATOR COTTON TIP 6 STRL (MISCELLANEOUS) ×2 IMPLANT
APPLICATOR COTTON TIP 6IN STRL (MISCELLANEOUS) ×4
BLADE SURG 15 STRL LF DISP TIS (BLADE) ×1 IMPLANT
BLADE SURG 15 STRL SS (BLADE) ×2
CHLORAPREP W/TINT 26 (MISCELLANEOUS) ×2 IMPLANT
COVER SURGICAL LIGHT HANDLE (MISCELLANEOUS) ×2 IMPLANT
COVER TIP SHEARS 8 DVNC (MISCELLANEOUS) ×1 IMPLANT
COVER TIP SHEARS 8MM DA VINCI (MISCELLANEOUS) ×2
COVER WAND RF STERILE (DRAPES) IMPLANT
DECANTER SPIKE VIAL GLASS SM (MISCELLANEOUS) ×2 IMPLANT
DRAPE ARM DVNC X/XI (DISPOSABLE) ×4 IMPLANT
DRAPE COLUMN DVNC XI (DISPOSABLE) ×1 IMPLANT
DRAPE DA VINCI XI ARM (DISPOSABLE) ×8
DRAPE DA VINCI XI COLUMN (DISPOSABLE) ×2
ELECT REM PT RETURN 15FT ADLT (MISCELLANEOUS) ×2 IMPLANT
GLOVE BIOGEL M 8.0 STRL (GLOVE) ×4 IMPLANT
GOWN STRL REUS W/TWL XL LVL3 (GOWN DISPOSABLE) ×7 IMPLANT
IRRIG SUCT STRYKERFLOW 2 WTIP (MISCELLANEOUS)
IRRIGATION SUCT STRKRFLW 2 WTP (MISCELLANEOUS) ×1 IMPLANT
KIT BASIN OR (CUSTOM PROCEDURE TRAY) ×2 IMPLANT
KIT TURNOVER KIT A (KITS) ×2 IMPLANT
MESH VENTRALIGHT ST 4X6IN (Mesh General) ×1 IMPLANT
NEEDLE HYPO 22GX1.5 SAFETY (NEEDLE) ×2 IMPLANT
OBTURATOR OPTICAL STANDARD 8MM (TROCAR) ×2
OBTURATOR OPTICAL STND 8 DVNC (TROCAR) ×1
OBTURATOR OPTICALSTD 8 DVNC (TROCAR) IMPLANT
PACK CARDIOVASCULAR III (CUSTOM PROCEDURE TRAY) ×2 IMPLANT
PAD POSITIONING PINK XL (MISCELLANEOUS) ×2 IMPLANT
PENCIL SMOKE EVACUATOR (MISCELLANEOUS) IMPLANT
SCISSORS LAP 5X35 DISP (ENDOMECHANICALS) ×2 IMPLANT
SEAL CANN UNIV 5-8 DVNC XI (MISCELLANEOUS) ×3 IMPLANT
SEAL XI 5MM-8MM UNIVERSAL (MISCELLANEOUS) ×6
SET BI-LUMEN FLTR TB AIRSEAL (TUBING) ×1 IMPLANT
SOL ANTI FOG 6CC (MISCELLANEOUS) ×1 IMPLANT
SOLUTION ANTI FOG 6CC (MISCELLANEOUS) ×1
SOLUTION ELECTROLUBE (MISCELLANEOUS) ×2 IMPLANT
SPONGE LAP 18X18 RF (DISPOSABLE) ×2 IMPLANT
STAPLER VISISTAT 35W (STAPLE) IMPLANT
SUT ETHIBOND 2 0 SH (SUTURE) ×2
SUT ETHIBOND 2 0 SH 36X2 (SUTURE) IMPLANT
SUT V-LOC BARB 180 2/0GR9 GS23 (SUTURE) ×2
SUT VIC AB 3-0 SH 27 (SUTURE)
SUT VIC AB 3-0 SH 27XBRD (SUTURE) IMPLANT
SUT VIC AB 4-0 SH 18 (SUTURE) ×2 IMPLANT
SUT VLOC 180 0 6IN GS21 (SUTURE) ×1 IMPLANT
SUT VLOC 180 2-0 9IN GS21 (SUTURE) IMPLANT
SUTURE V-LC BRB 180 2/0GR9GS23 (SUTURE) IMPLANT
SYR 10ML ECCENTRIC (SYRINGE) ×2 IMPLANT
SYR 20ML LL LF (SYRINGE) ×2 IMPLANT
TOWEL OR 17X26 10 PK STRL BLUE (TOWEL DISPOSABLE) ×2 IMPLANT
TOWEL OR NON WOVEN STRL DISP B (DISPOSABLE) ×2 IMPLANT
TROCAR ADV FIXATION 12X100MM (TROCAR) IMPLANT

## 2021-01-11 NOTE — Anesthesia Procedure Notes (Signed)
Procedure Name: Intubation Date/Time: 01/11/2021 7:40 AM Performed by: Myna Bright, CRNA Pre-anesthesia Checklist: Patient identified, Emergency Drugs available, Suction available and Patient being monitored Patient Re-evaluated:Patient Re-evaluated prior to induction Oxygen Delivery Method: Circle system utilized Preoxygenation: Pre-oxygenation with 100% oxygen Induction Type: IV induction Ventilation: Mask ventilation without difficulty and Oral airway inserted - appropriate to patient size Laryngoscope Size: Mac and 4 Grade View: Grade I Tube type: Oral Tube size: 7.5 mm Number of attempts: 1 Airway Equipment and Method: Stylet Placement Confirmation: ETT inserted through vocal cords under direct vision,  positive ETCO2 and breath sounds checked- equal and bilateral Secured at: 22 cm Tube secured with: Tape Dental Injury: Teeth and Oropharynx as per pre-operative assessment  Comments: Smooth IV induction. Easy mask airway. Grade I view and AOI by Joellen Jersey, EMT student.

## 2021-01-11 NOTE — Discharge Instructions (Signed)
Laparoscopic Inguinal Hernia Repair, Adult, Care After The following information offers guidance on how to care for yourself after your procedure. Your health care provider may also give you more specific instructions. If you have problems or questions, contact your health care provider. What can I expect after the procedure? After the procedure, it is common to have:  Pain.  Swelling and bruising around the incision area.  Scrotal swelling, in males.  Some fluid or blood draining from your incisions. Follow these instructions at home: Medicines  Take over-the-counter and prescription medicines only as told by your health care provider.  Ask your health care provider if the medicine prescribed to you: ? Requires you to avoid driving or using machinery. ? Can cause constipation. You may need to take these actions to prevent or treat constipation:  Drink enough fluid to keep your urine pale yellow.  Take over-the-counter or prescription medicines.  Eat foods that are high in fiber, such as beans, whole grains, and fresh fruits and vegetables.  Limit foods that are high in fat and processed sugars, such as fried or sweet foods. Incision care  Follow instructions from your health care provider about how to take care of your incisions. Make sure you: ? Wash your hands with soap and water for at least 20 seconds before and after you change your bandage (dressing). If soap and water are not available, use hand sanitizer. ? Change your dressing as told by your health care provider. ? Leave stitches (sutures), skin glue, or adhesive strips in place. These skin closures may need to stay in place for 2 weeks or longer. If adhesive strip edges start to loosen and curl up, you may trim the loose edges. Do not remove adhesive strips completely unless your health care provider tells you to do that.  Check your incision area every day for signs of infection. Check for: ? More redness, swelling,  or pain. ? More fluid or blood. ? Warmth. ? Pus or a bad smell.  Wear loose, soft clothing while your incisions heal.   Managing pain and swelling If directed, put ice on the painful or swollen areas. To do this:  Put ice in a plastic bag.  Place a towel between your skin and the bag.  Leave the ice on for 20 minutes, 2-3 times a day.  Remove the ice if your skin turns bright red. This is very important. If you cannot feel pain, heat, or cold, you have a greater risk of damage to the area.   Activity  Do not lift anything that is heavier than 10 lb (4.5 kg), or the limit that you are told, until your health care provider says that it is safe.  Ask your health care provider what activities are safe for you. A lot of activity during the first week after surgery can increase pain and swelling. For 1 week after your procedure: ? Avoid activities that take a lot of effort, such as exercise or sports. ? You may walk and climb stairs as needed for daily activity, but avoid long walks or climbing stairs for exercise. General instructions  If you were given a sedative during the procedure, it can affect you for several hours. Do not drive or operate machinery until your health care provider says that it is safe.  Do not take baths, swim, or use a hot tub until your health care provider approves. Ask your health care provider if you may take showers. You may only be allowed  to take sponge baths.  Do not use any products that contain nicotine or tobacco. These products include cigarettes, chewing tobacco, and vaping devices, such as e-cigarettes. If you need help quitting, ask your health care provider.  Keep all follow-up visits. This is important. Contact a health care provider if:  You have any of these signs of infection: ? More redness, swelling, or pain around your incisions or your groin area. ? More fluid or blood coming from an incision. ? Warmth coming from an incision. ? Pus or  a bad smell coming from an incision. ? A fever or chills.  You have more swelling in your scrotum, if you are male.  You have severe pain and medicines do not help.  You have abdominal pain or swelling.  You cannot urinate or have a bowel movement.  You faint or feel dizzy.  You have nausea and vomiting. Get help right away if:  You have redness, warmth, or pain in your leg.  You have chest pain.  You have problems breathing. These symptoms may represent a serious problem that is an emergency. Do not wait to see if the symptoms will go away. Get medical help right away. Call your local emergency services (911 in the [place] Do not drive yourself to the hospital. Summary  Pain, swelling, and bruising are common after the procedure.  Check your incision area every day for signs of infection, such as more redness, swelling, or pain.  Put ice on painful or swollen areas for 20 minutes, 2-3 times a day. This information is not intended to replace advice given to you by your health care provider. Make sure you discuss any questions you have with your health care provider. Document Revised: 04/21/2020 Document Reviewed: 04/21/2020 Elsevier Patient Education  2021 [employer].

## 2021-01-11 NOTE — Transfer of Care (Signed)
Immediate Anesthesia Transfer of Care Note  Patient: Mike Day  Procedure(s) Performed: XI ROBOTIC ASSISTED  RIGHT INGUINAL HERNIA REPAIR WITH MESH (Right Abdomen)  Patient Location: PACU  Anesthesia Type:General  Level of Consciousness: sedated and responds to stimulation  Airway & Oxygen Therapy: Patient Spontanous Breathing and Patient connected to face mask oxygen  Post-op Assessment: Report given to RN and Post -op Vital signs reviewed and stable  Post vital signs: Reviewed and stable  Last Vitals:  Vitals Value Taken Time  BP    Temp    Pulse 50 01/11/21 1245  Resp 11 01/11/21 1245  SpO2 100 % 01/11/21 1245  Vitals shown include unvalidated device data.  Last Pain:  Vitals:   01/11/21 0545  TempSrc: Oral         Complications: No complications documented.

## 2021-01-11 NOTE — H&P (Signed)
Chief Complaint:  Right inguinal hernia  History of Present Illness:  Mike Day is an 75 y.o. male who has had a new right inguinal hernia.  He has a mass in the upper right inguinal canal that isn't reducible today.  He has had a prior LIH direct last year.  He had a prostatectomy in 2006 and sling for incontinence in 2008 done through the perineum.    Informed consent regarding robotic RIH and site marked.    Past Medical History:  Diagnosis Date  . Anemia    Early 20's  . Atrial fibrillation ([place]) 10/07/2014   3-14 day cardiac monitor 03/2019:  AFib, Avg HR 69, no significant pauses, PVCs, wide complex runs (aberrancy vs NSVT - longest 32 beats).  . Atrial flutter [facility]) 2008   Status post RFCA Dr Cristopher Peru 2008  . Carotid artery plaque    Carotid US 8/21: Bilat ICA 1-39  . Chronic atrial fibrillation (HCC)    Eliquis  . Dysrhythmia    a fib   . Echocardiograms    Echo 12/14 - Severe basal septal hypertrophy, moderate LVH, EF 60-65%, moderate LAE, mild RAE // Echo 7/20: EF 60-65, severe asymmetric LVH, mild RVE, normal RV SF, moderate BAE, moderate MAC, moderate aortic valve calcification (noncoronary cusp), mild dilation of ascending aorta (41 mm)  . Hx of adenomatous colonic polyps   . Hyperlipidemia   . Hypertension   . Malignant neoplasm of prostate ([place]) 05/18/2012  . Neuromuscular disorder (HCC)    neuropathy feet  . Patellar sleeve fracture of left knee 2021  . Prostate cancer [facility]) 2006   Prostatectomy  . Shingles 01/2019   left side of face/eye/neck/ear     Past Surgical History:  Procedure Laterality Date  . BLADDER SUSPENSION  2010  . COLONOSCOPY W/ POLYPECTOMY  2003;2010;04/2014   Tubular adenoma: recall 5 yrs (after 04/2019)  . EYE SURGERY Bilateral 2021,2022  . HERNIA REPAIR  07/2008   Dr.Nedda Gains  . INGUINAL HERNIA REPAIR Left 08/25/2020   Procedure: OPEN LEFT INGUINAL HERNIA REPAIR WITH MESH;  Surgeon: Johnathan Hausen, MD;  Location: [facility];  Service:  General;  Laterality: Left;  . MOHS SURGERY     left hand  . 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 RADIOFREQUENCY ABLATION  01/15/2007   for ectopic atrial foci  . SEPTOPLASTY  Age 63  . TRANSTHORACIC ECHOCARDIOGRAM  09/02/2013   Normal.  EF 60%.  LAE and RAE.    Current Facility-Administered Medications  Medication Dose Route Frequency Provider Last Rate Last Admin  . bupivacaine liposome (EXPAREL) 1.3 % injection 266 mg  20 mL Infiltration On Call to OR Johnathan Hausen, MD      . ceFAZolin (ANCEF) IVPB 2g/100 mL premix  2 g Intravenous On Call to OR Johnathan Hausen, MD      . Chlorhexidine Gluconate Cloth 2 % PADS 6 each  6 each Topical Once Johnathan Hausen, MD       And  . Chlorhexidine Gluconate Cloth 2 % PADS 6 each  6 each Topical Once Johnathan Hausen, MD      . lactated ringers infusion   Intravenous Continuous Stoltzfus, March Rummage, DO 10 mL/hr at 01/11/21 9833 Continued from Pre-op at 01/11/21 0643  . scopolamine (TRANSDERM-SCOP) 1 MG/3DAYS 1.5 mg  1 patch Transdermal On Call to OR Johnathan Hausen, MD   1.5 mg at 01/11/21 0551   Levofloxacin, Neosporin [neomycin-bacitracin zn-polymyx], and Tape Family History  Problem Relation Age of Onset  . Lung disease Father        Black Lung  . Heart disease Father        Congenital  . Emphysema Father   . COPD Father   . Diabetes Mother   . Stroke Mother 35  . Hypertension Brother   . Heart disease Brother   . Hyperlipidemia Brother   . Heart attack Brother 87       sudden death  . Cancer Neg Hx   . Colon cancer Neg Hx   . Esophageal cancer Neg Hx   . Rectal cancer Neg Hx   . Stomach cancer Neg Hx    Social History:   reports that he quit smoking about 13 years ago. He has never used smokeless tobacco. He reports current alcohol use of about 7.0 standard drinks of alcohol per week. He reports that he does not use drugs.   REVIEW OF SYSTEMS : Negative except for see problem list.  Stopped Eliquis  on Friday.   Physical Exam:   Blood pressure (!) 152/86, pulse (!) 51, temperature 97.7 F (36.5 C), temperature source Oral, resp. rate 18, height _0  (1.905 m), weight 83.9 kg, SpO2 100 %. Body mass index is 23.12 kg/m.  Gen:  WDWN WM NAD  Neurological: Alert and oriented to person, place, and time. Motor and sensory function is grossly intact  Head: Normocephalic and atraumatic.  Eyes: Conjunctivae are normal. Pupils are equal, round, and reactive to light. No scleral icterus.  Neck: Normal range of motion. Neck supple. No tracheal deviation or thyromegaly present.  Cardiovascular:  SR without murmurs or gallops.  No carotid bruits Breast:  Not examined Respiratory: Effort normal.  No respiratory distress. No chest wall tenderness. Breath sounds normal.  No wheezes, rales or rhonchi.  Abdomen:  No upper abdominal incisions.   GU:  Mass in right inguinal region consistent with RIH incarerated with fat.   Musculoskeletal: Normal range of motion. Extremities are nontender. No cyanosis, edema or clubbing noted Lymphadenopathy: No cervical, preauricular, postauricular or axillary adenopathy is present Skin: Skin is warm and dry. No rash noted. No diaphoresis. No erythema. No pallor. Pscyh: Normal mood and affect. Behavior is normal. Judgment and thought content normal.   LABORATORY RESULTS: No results found for this or any previous visit (from the past 48 hour(s)).   RADIOLOGY RESULTS: No results found.  Problem List: Patient Active Problem List   Diagnosis Date Noted  . Closed nondisplaced fracture of left patella 01/20/2020  . Left groin mass 12/16/2019  . Shoulder dislocation, left, subsequent encounter 08/19/2019  . Hx of colonic polyps 06/07/2019  . Post herpetic neuralgia 03/15/2019  . Hyperkalemia 03/15/2019  . Cerebral atrophy ([place]) 03/15/2019  . Cerebral arterial disease 03/15/2019  . Atrial fibrillation ([place]) 10/07/2014  . Dermatochalasis of both upper eyelids  07/01/2014  . Chronic anticoagulation 04/14/2014  . 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 Diabetes mellitus without complication ([place]) 62/22/9798  . SKIN CANCER, HX OF 03/11/2009  . Essential hypertension 09/17/2008  . Hyperlipidemia 11/01/2007  . PROSTATE CANCER, HX OF 01/18/2007  . Tubular adenoma of colon 01/12/2007    Assessment & Plan: RIH.  Plan robotic right inguinal hernia repair.  If adhesions are severe, may perform open RIH    Matt B. Hassell Done, MD, [facility] [phone] beeper [phone]  01/11/2021 7:07 AM

## 2021-01-11 NOTE — Op Note (Signed)
BRENTYN SEEHAFER  1946/05/22   01/11/2021    PCP:  Ma Hillock, DO   Surgeon: Kaylyn Lim, MD, FACS  Asst:  Greer Pickerel, MD, FACS  Anes:  general  Preop Dx: Incarcerated right inguinal hernia Postop Dx: Incarcerated right direct inguinal hernia  Procedure: Xi robotic assist right inguinal hernia repair with mesh and open excision of necrotic hernia Location Surgery: [facility] 2 Complications: None noted  EBL:   minmal cc  Drains: none  Description of Procedure:  The patient was taken to OR 2 .  After anesthesia was administered and the patient was prepped  with chloroprep  and a timeout was performed.  Access to the abdomen was achieved with an Optiview technique on the left side. Three 8 mm robotic trocars were placed and the patient was in [place].  The patient had a  prior robotic prostatectomy and some adhesions were removed in the midline.  With the monopolar scissors flaps were created on the right side and a prompt dissection carried [place] down to the are of prior surgery where the dissection became more tedious.  The incarcerated right inguinal was unable to be reduced from within.  This ultimately meant transection of the hernia.  The prostatectomy adhesions slowed [place] somewhat.  In the end, a large flap was made and coated mesh inserted and secured with interupted sutures.  The flap was closed with running v lock sutures.    The ingunal incision was made and the incarcerated mass which appeared to be infarcted omentum was removed and sent to path.  The small direct defect was closed with 2-0 prolene sutures after irrigation.  Wounds closed with monocryl and dermabond.    The patient tolerated the procedure well and was taken to the PACU in stable condition.     Matt B. Hassell Done, [place], [facility], [place]

## 2021-01-11 NOTE — Interval H&P Note (Signed)
History and Physical Interval Note:  01/11/2021 8:01 AM  Mike Day  has presented today for surgery, with the diagnosis of RIGHT INGUINAL HERNIA.  The various methods of treatment have been discussed with the patient and family. After consideration of risks, benefits and other options for treatment, the patient has consented to  Procedure(s): XI ROBOTIC ASSISTED  RIGHT INGUINAL HERNIA REPAIR WITH MESH (Right) as a surgical intervention.  The patient's history has been reviewed, patient examined, no change in status, stable for surgery.  I have reviewed the patient's chart and labs.  Questions were answered to the patient's satisfaction.     Mike Day

## 2021-01-12 ENCOUNTER — Encounter (HOSPITAL_COMMUNITY): Payer: Self-pay | Admitting: Surgery

## 2021-01-12 LAB — SURGICAL PATHOLOGY

## 2021-01-12 NOTE — Anesthesia Postprocedure Evaluation (Addendum)
Anesthesia Post Note  Patient: Mike Day  Procedure(s) Performed: XI ROBOTIC ASSISTED  RIGHT INGUINAL HERNIA REPAIR WITH MESH (Right Abdomen)     Patient location during evaluation: PACU Anesthesia Type: General Level of consciousness: sedated Pain management: pain level controlled Vital Signs Assessment: post-procedure vital signs reviewed and stable Respiratory status: spontaneous breathing and respiratory function stable Cardiovascular status: stable Postop Assessment: no apparent nausea or vomiting Anesthetic complications: no   No complications documented.  Last Vitals:  Vitals:   01/11/21 1400 01/11/21 1410  BP: 129/74 124/78  Pulse: (!) 48 (!) 48  Resp: 13 16  Temp:  36.4 C  SpO2: 92% 95%    Last Pain:  Vitals:   01/11/21 1410  TempSrc:   PainSc: 0-No pain   Pain Goal:                   Merlinda Frederick

## 2021-02-08 DIAGNOSIS — L570 Actinic keratosis: Secondary | ICD-10-CM | POA: Diagnosis not present

## 2021-02-08 DIAGNOSIS — C44229 Squamous cell carcinoma of skin of left ear and external auricular canal: Secondary | ICD-10-CM | POA: Diagnosis not present

## 2021-02-08 DIAGNOSIS — Z85828 Personal history of other malignant neoplasm of skin: Secondary | ICD-10-CM | POA: Diagnosis not present

## 2021-02-08 DIAGNOSIS — L821 Other seborrheic keratosis: Secondary | ICD-10-CM | POA: Diagnosis not present

## 2021-02-22 ENCOUNTER — Other Ambulatory Visit: Payer: Self-pay | Admitting: Cardiovascular Disease

## 2021-03-04 DIAGNOSIS — C44229 Squamous cell carcinoma of skin of left ear and external auricular canal: Secondary | ICD-10-CM | POA: Diagnosis not present

## 2021-03-04 DIAGNOSIS — Z85828 Personal history of other malignant neoplasm of skin: Secondary | ICD-10-CM | POA: Diagnosis not present

## 2021-03-24 DIAGNOSIS — Z20822 Contact with and (suspected) exposure to covid-19: Secondary | ICD-10-CM | POA: Diagnosis not present

## 2021-03-29 DIAGNOSIS — C44629 Squamous cell carcinoma of skin of left upper limb, including shoulder: Secondary | ICD-10-CM | POA: Diagnosis not present

## 2021-03-29 DIAGNOSIS — C44519 Basal cell carcinoma of skin of other part of trunk: Secondary | ICD-10-CM | POA: Diagnosis not present

## 2021-03-29 DIAGNOSIS — D0461 Carcinoma in situ of skin of right upper limb, including shoulder: Secondary | ICD-10-CM | POA: Diagnosis not present

## 2021-03-29 DIAGNOSIS — L570 Actinic keratosis: Secondary | ICD-10-CM | POA: Diagnosis not present

## 2021-03-29 DIAGNOSIS — L820 Inflamed seborrheic keratosis: Secondary | ICD-10-CM | POA: Diagnosis not present

## 2021-03-29 DIAGNOSIS — Z85828 Personal history of other malignant neoplasm of skin: Secondary | ICD-10-CM | POA: Diagnosis not present

## 2021-04-10 ENCOUNTER — Other Ambulatory Visit: Payer: Self-pay | Admitting: Physician Assistant

## 2021-04-29 DIAGNOSIS — L909 Atrophic disorder of skin, unspecified: Secondary | ICD-10-CM | POA: Diagnosis not present

## 2021-04-29 DIAGNOSIS — H0011 Chalazion right upper eyelid: Secondary | ICD-10-CM | POA: Diagnosis not present

## 2021-04-29 LAB — HM DIABETES EYE EXAM

## 2021-05-04 ENCOUNTER — Other Ambulatory Visit: Payer: Self-pay

## 2021-05-04 ENCOUNTER — Ambulatory Visit (INDEPENDENT_AMBULATORY_CARE_PROVIDER_SITE_OTHER): Payer: Medicare Other

## 2021-05-04 DIAGNOSIS — Z23 Encounter for immunization: Secondary | ICD-10-CM

## 2021-05-23 ENCOUNTER — Other Ambulatory Visit: Payer: Self-pay | Admitting: Cardiovascular Disease

## 2021-05-24 NOTE — Telephone Encounter (Signed)
Prescription refill request for Eliquis received.  Indication: afib  Last office visit: Mike Day 08/21/2020 Scr: 0.72, 01/01/2021 Age: 75 yo Weight: 83.9 kg   Refill sent.

## 2021-06-03 ENCOUNTER — Ambulatory Visit: Payer: Medicare Other

## 2021-06-14 DIAGNOSIS — L570 Actinic keratosis: Secondary | ICD-10-CM | POA: Diagnosis not present

## 2021-06-14 DIAGNOSIS — L814 Other melanin hyperpigmentation: Secondary | ICD-10-CM | POA: Diagnosis not present

## 2021-06-14 DIAGNOSIS — Z85828 Personal history of other malignant neoplasm of skin: Secondary | ICD-10-CM | POA: Diagnosis not present

## 2021-06-14 DIAGNOSIS — H01001 Unspecified blepharitis right upper eyelid: Secondary | ICD-10-CM | POA: Diagnosis not present

## 2021-06-14 DIAGNOSIS — D1801 Hemangioma of skin and subcutaneous tissue: Secondary | ICD-10-CM | POA: Diagnosis not present

## 2021-06-14 DIAGNOSIS — L821 Other seborrheic keratosis: Secondary | ICD-10-CM | POA: Diagnosis not present

## 2021-06-14 DIAGNOSIS — C44629 Squamous cell carcinoma of skin of left upper limb, including shoulder: Secondary | ICD-10-CM | POA: Diagnosis not present

## 2021-06-14 DIAGNOSIS — D0462 Carcinoma in situ of skin of left upper limb, including shoulder: Secondary | ICD-10-CM | POA: Diagnosis not present

## 2021-06-14 DIAGNOSIS — C44519 Basal cell carcinoma of skin of other part of trunk: Secondary | ICD-10-CM | POA: Diagnosis not present

## 2021-06-17 DIAGNOSIS — L909 Atrophic disorder of skin, unspecified: Secondary | ICD-10-CM | POA: Diagnosis not present

## 2021-06-17 DIAGNOSIS — H00021 Hordeolum internum right upper eyelid: Secondary | ICD-10-CM | POA: Diagnosis not present

## 2021-06-22 ENCOUNTER — Encounter: Payer: Self-pay | Admitting: Family Medicine

## 2021-06-22 ENCOUNTER — Other Ambulatory Visit: Payer: Self-pay

## 2021-06-22 ENCOUNTER — Ambulatory Visit (INDEPENDENT_AMBULATORY_CARE_PROVIDER_SITE_OTHER): Payer: Medicare Other | Admitting: Family Medicine

## 2021-06-22 VITALS — BP 133/77 | HR 62 | Temp 98.4°F | Ht 75.0 in | Wt 186.0 lb

## 2021-06-22 DIAGNOSIS — K5900 Constipation, unspecified: Secondary | ICD-10-CM

## 2021-06-22 DIAGNOSIS — E58 Dietary calcium deficiency: Secondary | ICD-10-CM | POA: Diagnosis not present

## 2021-06-22 DIAGNOSIS — M949 Disorder of cartilage, unspecified: Secondary | ICD-10-CM | POA: Diagnosis not present

## 2021-06-22 DIAGNOSIS — R202 Paresthesia of skin: Secondary | ICD-10-CM

## 2021-06-22 DIAGNOSIS — R5383 Other fatigue: Secondary | ICD-10-CM

## 2021-06-22 DIAGNOSIS — Z8546 Personal history of malignant neoplasm of prostate: Secondary | ICD-10-CM

## 2021-06-22 DIAGNOSIS — Z7901 Long term (current) use of anticoagulants: Secondary | ICD-10-CM | POA: Diagnosis not present

## 2021-06-22 DIAGNOSIS — M899 Disorder of bone, unspecified: Secondary | ICD-10-CM | POA: Diagnosis not present

## 2021-06-22 DIAGNOSIS — I10 Essential (primary) hypertension: Secondary | ICD-10-CM | POA: Diagnosis not present

## 2021-06-22 DIAGNOSIS — M791 Myalgia, unspecified site: Secondary | ICD-10-CM

## 2021-06-22 DIAGNOSIS — I4891 Unspecified atrial fibrillation: Secondary | ICD-10-CM | POA: Diagnosis not present

## 2021-06-22 DIAGNOSIS — R531 Weakness: Secondary | ICD-10-CM | POA: Diagnosis not present

## 2021-06-22 DIAGNOSIS — E119 Type 2 diabetes mellitus without complications: Secondary | ICD-10-CM

## 2021-06-22 LAB — CBC WITH DIFFERENTIAL/PLATELET
Basophils Absolute: 0 10*3/uL (ref 0.0–0.1)
Basophils Relative: 0.4 % (ref 0.0–3.0)
Eosinophils Absolute: 0.1 10*3/uL (ref 0.0–0.7)
Eosinophils Relative: 1.1 % (ref 0.0–5.0)
HCT: 41.4 % (ref 39.0–52.0)
Hemoglobin: 14 g/dL (ref 13.0–17.0)
Lymphocytes Relative: 13.3 % (ref 12.0–46.0)
Lymphs Abs: 0.8 10*3/uL (ref 0.7–4.0)
MCHC: 33.9 g/dL (ref 30.0–36.0)
MCV: 100.1 fl — ABNORMAL HIGH (ref 78.0–100.0)
Monocytes Absolute: 0.5 10*3/uL (ref 0.1–1.0)
Monocytes Relative: 8 % (ref 3.0–12.0)
Neutro Abs: 4.7 10*3/uL (ref 1.4–7.7)
Neutrophils Relative %: 77.2 % — ABNORMAL HIGH (ref 43.0–77.0)
Platelets: 199 10*3/uL (ref 150.0–400.0)
RBC: 4.14 Mil/uL — ABNORMAL LOW (ref 4.22–5.81)
RDW: 13.8 % (ref 11.5–15.5)
WBC: 6.1 10*3/uL (ref 4.0–10.5)

## 2021-06-22 LAB — COMPREHENSIVE METABOLIC PANEL
ALT: 13 U/L (ref 0–53)
AST: 17 U/L (ref 0–37)
Albumin: 4.3 g/dL (ref 3.5–5.2)
Alkaline Phosphatase: 68 U/L (ref 39–117)
BUN: 11 mg/dL (ref 6–23)
CO2: 29 mEq/L (ref 19–32)
Calcium: 9.6 mg/dL (ref 8.4–10.5)
Chloride: 97 mEq/L (ref 96–112)
Creatinine, Ser: 0.77 mg/dL (ref 0.40–1.50)
GFR: 87.76 mL/min (ref >60.00)
Glucose, Bld: 121 mg/dL — ABNORMAL HIGH (ref 70–99)
Potassium: 4.8 mEq/L (ref 3.5–5.1)
Sodium: 135 mEq/L (ref 135–145)
Total Bilirubin: 0.8 mg/dL (ref 0.2–1.2)
Total Protein: 7 g/dL (ref 6.0–8.3)

## 2021-06-22 LAB — T4, FREE: Free T4: 0.81 ng/dL (ref 0.60–1.60)

## 2021-06-22 LAB — HEMOGLOBIN A1C: Hgb A1c MFr Bld: 6.4 % (ref 4.6–6.5)

## 2021-06-22 LAB — VITAMIN D 25 HYDROXY (VIT D DEFICIENCY, FRACTURES): VITD: 22.38 ng/mL — ABNORMAL LOW (ref 30.00–100.00)

## 2021-06-22 LAB — TSH: TSH: 0.98 u[IU]/mL (ref 0.35–5.50)

## 2021-06-22 LAB — VITAMIN B12: Vitamin B-12: 196 pg/mL — ABNORMAL LOW (ref 211–911)

## 2021-06-22 LAB — PSA: PSA: 0 ng/mL — ABNORMAL LOW (ref 0.10–4.00)

## 2021-06-22 LAB — CK: Total CK: 63 U/L (ref 7–232)

## 2021-06-22 NOTE — Progress Notes (Signed)
This visit occurred during the SARS-CoV-2 public health emergency.  Safety protocols were in place, including screening questions prior to the visit, additional usage of staff PPE, and extensive cleaning of exam room while observing appropriate contact time as indicated for disinfecting solutions.    Mike Day , 1945/09/30, 75 y.o., male MRN: [id number] Patient Care Team    Relationship Specialty Notifications Start End  Ma Hillock, DO PCP - General Family Medicine  06/11/18   Sherren Mocha, MD PCP - Cardiology Cardiology  08/21/20   Rod Can, MD Consulting Physician Orthopedic Surgery  10/03/15   Sharmon Revere Physician Assistant Cardiology  04/29/16   Rolm Bookbinder, MD Consulting Physician Dermatology  08/24/17   Myrlene Broker, MD Attending Physician Urology  06/11/18   Warden Fillers, MD Consulting Physician Ophthalmology  06/11/18   Rosemarie Ax, MD Consulting Physician Sports Medicine  01/21/20     Chief Complaint  Patient presents with   Weight Loss    Pt states he loss 30 pounds within last 2 mos from 207# to 186#; 3 pounds according to our records     Subjective: Pt presents for an OV with complaints of severe fatigue and weight loss for the last 2-3 months.  He states he gets "flat or out ".  He states he has noticed he has been more constipated as of lately and has been using a stool softener.  He does snore, but states he feels rested in the morning.  He has never been told he is having apneic spells during sleep.  He denies any dizziness, heart palpitations, chest pain or shortness of breath.  Of note he actually has not lost much weight, except for 2-3 pounds.  He has a significant medical history of prostate cancer, but does have his PSA completed by urology yearly.  Is up-to-date with his colon cancer screenings.  He is prescribed Eliquis for his atrial fibrillation.  He denies any blood loss per rectum. Depression screen [facility] 2/9  06/22/2021 08/18/2020 02/26/2019 06/05/2018 08/24/2017  Decreased Interest 0 0 0 0 0  Down, Depressed, Hopeless 0 0 0 0 0  PHQ - 2 Score 0 0 0 0 0  Some recent data might be hidden    Allergies  Allergen Reactions   Levofloxacin Rash   Neosporin [Neomycin-Bacitracin Zn-Polymyx] Other (See Comments)    Dry/scaly skin/blistering    Tape Other (See Comments)    ADHESIVE TAPE; BLISTERS,   Social History   Social History Narrative   Married, 1 son, 2 grand-daughters.   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   Alcohol: 1 glass red wine per night.   Was once a long distance runner.   Past Medical History:  Diagnosis Date   Anemia    Early 20's   Atrial fibrillation ([place]) 10/07/2014   3-14 day cardiac monitor 03/2019:  AFib, Avg HR 69, no significant pauses, PVCs, wide complex runs (aberrancy vs NSVT - longest 32 beats).   Atrial flutter ([place]) 2008   Status post RFCA Dr Cristopher Peru 2008   Carotid artery plaque    Carotid US 8/21: Bilat ICA 1-39   Chronic atrial fibrillation (HCC)    Eliquis   Closed nondisplaced fracture of left patella 01/20/2020   Dermatochalasis of both upper eyelids 07/01/2014   Dysrhythmia    a fib    Echocardiograms    Echo 12/14 - Severe  basal septal hypertrophy, moderate LVH, EF 60-65%, moderate LAE, mild RAE // Echo 7/20: EF 60-65, severe asymmetric LVH, mild RVE, normal RV SF, moderate BAE, moderate MAC, moderate aortic valve calcification (noncoronary cusp), mild dilation of ascending aorta (41 mm)   Hx of adenomatous colonic polyps    Hyperlipidemia    Hypertension    Malignant neoplasm of prostate ([place]) 05/18/2012   Neuromuscular disorder (HCC)    neuropathy feet   Patellar sleeve fracture of left knee 2021   Prostate cancer ([place]) 2006   Prostatectomy   Shingles 01/2019   left side of face/eye/neck/ear    SKIN CANCER, HX OF 03/11/2009   Moh's L hand for Squamous Cell;2 other squamous cells removed w/o  Mohs 2013 Rolm Bookbinder MD  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   Past Surgical History:  Procedure Laterality Date   BLADDER SUSPENSION  2010   COLONOSCOPY W/ POLYPECTOMY  2003;2010;04/2014   Tubular adenoma: recall 5 yrs (after 04/2019)   EYE SURGERY Bilateral 2021,2022   HERNIA REPAIR  07/2008   Dr.Martin   INGUINAL HERNIA REPAIR Left 08/25/2020   Procedure: OPEN LEFT INGUINAL HERNIA REPAIR WITH MESH;  Surgeon: Johnathan Hausen, MD;  Location: [facility];  Service: General;  Laterality: Left;   MOHS SURGERY     left hand   MOHS SURGERY Left 12/2020   ear   PROSTATECTOMY  2006   Robotic for adenocarcinoma; Dr Lawerance Bach   RADIOFREQUENCY ABLATION  01/15/2007   for ectopic atrial foci   SEPTOPLASTY  Age 63   TRANSTHORACIC ECHOCARDIOGRAM  09/02/2013   Normal.  EF 60%.  LAE and RAE.   XI ROBOTIC ASSISTED INGUINAL HERNIA REPAIR WITH MESH Right 01/11/2021   Procedure: XI ROBOTIC ASSISTED  RIGHT INGUINAL HERNIA REPAIR WITH MESH;  Surgeon: Johnathan Hausen, MD;  Location: [facility];  Service: General;  Laterality: Right;   Family History  Problem Relation Age of Onset   Lung disease Father        Black Lung   Heart disease Father        Congenital   Emphysema Father    COPD Father    Diabetes Mother    Stroke Mother 37   Hypertension Brother    Heart disease Brother    Hyperlipidemia Brother    Heart attack Brother 37       sudden death   Cancer Neg Hx    Colon cancer Neg Hx    Esophageal cancer Neg Hx    Rectal cancer Neg Hx    Stomach cancer Neg Hx    Allergies as of 06/22/2021       Reactions   Levofloxacin Rash   Neosporin [neomycin-bacitracin Zn-polymyx] Other (See Comments)   Dry/scaly skin/blistering    Tape Other (See Comments)   ADHESIVE TAPE; BLISTERS,        Medication List        Accurate as of June 22, 2021 11:59 PM. If you have any questions, ask your nurse or doctor.          STOP taking these medications    HYDROcodone-acetaminophen  5-325 MG tablet Commonly known as: NORCO/VICODIN Stopped by: Howard Pouch, DO   Pfizer-BioNTech COVID-19 Vacc 30 MCG/0.3ML injection Generic drug: COVID-19 mRNA vaccine Therapist, music) Stopped by: Howard Pouch, DO       TAKE these medications    acetaminophen 500 MG tablet Commonly known as: TYLENOL Take 1,000 mg by mouth every 8 (eight) hours as needed  for moderate pain.   amLODipine 2.5 MG tablet Commonly known as: NORVASC TAKE 1 TABLET TWICE A DAY What changed:  how much to take how to take this when to take this   ARTIFICIAL TEAR SOLUTION OP Place 1 drop into both eyes daily as needed (dry eyes).   cetaphil cream Apply 1 application topically daily.   Eliquis 5 MG Tabs tablet Generic drug: apixaban TAKE 1 TABLET TWICE A DAY   fluorouracil 5 % cream Commonly known as: EFUDEX SMARTSIG:Sparingly Topical Twice Daily   lisinopril 20 MG tablet Commonly known as: ZESTRIL TAKE 1 TABLET DAILY (NOTE  DOSE CHANGE)   simvastatin 20 MG tablet Commonly known as: ZOCOR TAKE 1 TABLET AT BEDTIME   triamcinolone 55 MCG/ACT Aero nasal inhaler Commonly known as: NASACORT Place 1 spray into the nose daily as needed (allergies).        All past medical history, surgical history, allergies, family history, immunizations andmedications were updated in the EMR today and reviewed under the history and medication portions of their EMR.     ROS: Negative, with the exception of above mentioned in HPI   Objective:  BP 133/77   Pulse 62   Temp 98.4 F (36.9 C) (Oral)   Ht _0  (1.905 m)   Wt 186 lb (84.4 kg)   SpO2 99%   BMI 23.25 kg/m  Body mass index is 23.25 kg/m. Gen: Afebrile. No acute distress. Nontoxic in appearance, well developed, well nourished.  Very pleasant male. HENT: AT. [place].  No cough.  No hoarseness. Eyes:Pupils Equal Round Reactive to light, Extraocular movements intact,  Conjunctiva without redness, discharge or icterus. Neck/lymp/endocrine: Supple, no  lymphadenopathy, no thyromegaly CV: RRR, no edema Chest: CTAB, no wheeze or crackles. Good air movement, normal resp effort.  Abd: Soft. NTND. BS present.  No masses palpated. No rebound or guarding.  Skin: no rashes, purpura or petechiae.  Neuro: Normal gait. PERLA. EOMi. Alert. Oriented x3 Psych: Normal affect, dress and demeanor. Normal speech. Normal thought content and judgment.  No results found. No results found. No results found for this or any previous visit (from the past 24 hour(s)).  Assessment/Plan: ADIAN JABLONOWSKI is a 75 y.o. male present for OV for  Constipation, unspecified constipation type Continue stool softener - TSH - T4, free - Comp Met (CMET)  History of prostate cancer - PSA - Protein Electrophoresis, (serum) - Urinalysis, Routine w reflex microscopic  Paresthesia/fatigue/muscle weakness/disorder of bone/myalgia/fatigue - B12 - Vitamin D - Hemoglobin A1c - PSA - TSH - T4, free - CBC w/Diff - Iron, TIBC and Ferritin Panel - Protein Electrophoresis, (serum) - B12 - Urinalysis, Routine w reflex microscopic - CK  Diabetes mellitus without complication (HCC) - Hemoglobin A1c  Essential hypertension/Chronic anticoagulation/A. fib Well-controlled, will collect CBC today and iron panel to ensure levels are normal secondary to chronic anticoagulation -Could consider updating echocardiogram, last 2020 if further evaluation is needed on cause  Patient will be called with lab results and further plan will be made at that time.  Reviewed expectations re: course of current medical issues. Discussed self-management of symptoms. Outlined signs and symptoms indicating need for more acute intervention. Patient verbalized understanding and all questions were answered. Patient received an After-Visit Summary.    Orders Placed This Encounter  Procedures   Urine Culture   Hemoglobin A1c   PSA   TSH   T4, free   CBC w/Diff   Iron, TIBC and Ferritin  Panel   Protein  Electrophoresis, (serum)   B12   Vitamin D (25 hydroxy)   Urinalysis, Routine w reflex microscopic   Comp Met (CMET)   CK   Extra Urine Specimen   EXTRA LAV TOP TUBE   No orders of the defined types were placed in this encounter.  Referral Orders  No referral(s) requested today     Note is dictated utilizing voice recognition software. Although note has been proof read prior to signing, occasional typographical errors still can be missed. If any questions arise, please do not hesitate to call for verification.   electronically signed by:  Howard Pouch, DO  [place]

## 2021-06-22 NOTE — Patient Instructions (Signed)
Great to see you today.  I have refilled the medication(s) we provide.   If labs were collected, we will inform you of lab results once received either by echart message or telephone call.   - echart message- for normal results that have been seen by the patient already.   - telephone call: abnormal results or if patient has not viewed results in their echart.  

## 2021-06-23 ENCOUNTER — Telehealth: Payer: Self-pay | Admitting: Family Medicine

## 2021-06-23 ENCOUNTER — Encounter: Payer: Self-pay | Admitting: Family Medicine

## 2021-06-23 MED ORDER — VITAMIN D 25 MCG (1000 UNIT) PO TABS: 1000.0000 [IU] | ORAL_TABLET | Freq: Every day | ORAL | 3 refills | Status: DC

## 2021-06-23 MED ORDER — CYANOCOBALAMIN 1000 MCG SL SUBL: SUBLINGUAL_TABLET | SUBLINGUAL | 3 refills | Status: DC

## 2021-06-23 NOTE — Telephone Encounter (Signed)
Please inform patient: His liver and kidney function are normal. Electrolytes look normal and stable. Iron panel is normal. Vitamin D levels are low at 22 . Blood cell counts are normal. Diabetes/prediabetes condition is stable with an A1c of 6.4 so this is well controlled with diet. Thyroid levels are normal. PSA level is 0 as expected for him. B12 levels are severely low at 196-this will cause severe fatigue, weakness and nerve discomfort.  Normal levels are at least greater than 400 and ideally greater than 750.   I have called in vitamin D oral supplementation 1 tab daily of 1000 units.  Taking this daily, with food should bring up his levels into normal range of greater than 30.  B12-I would like him to start B12 injections by nurse visit once weekly for 4 weeks, then once every 2 weeks for 2 doses.  After which, we will need to see him back for follow-up on B12 deficiency and weakness with provider and we will recheck his B12 levels at that time.   -I did call in B12 sublingual 1000 mcg daily for him.  He is to start this as well, must be sublingual form for absorption daily except for the day he gets his injections.  Follow-up appointment 9 weeks, approximately 1 week after last scheduled B12 injection.

## 2021-06-23 NOTE — Telephone Encounter (Signed)
Spoke with pt regarding labs and instructions.   

## 2021-06-24 ENCOUNTER — Ambulatory Visit (INDEPENDENT_AMBULATORY_CARE_PROVIDER_SITE_OTHER): Payer: Medicare Other

## 2021-06-24 ENCOUNTER — Other Ambulatory Visit: Payer: Self-pay

## 2021-06-24 DIAGNOSIS — E538 Deficiency of other specified B group vitamins: Secondary | ICD-10-CM | POA: Diagnosis not present

## 2021-06-24 MED ORDER — CYANOCOBALAMIN 1000 MCG/ML IJ SOLN
1000.0000 ug | INTRAMUSCULAR | Status: AC
  Administered 2021-06-24 – 2021-07-16 (×4): 1000 ug via INTRAMUSCULAR

## 2021-06-24 NOTE — Progress Notes (Signed)
Pt here for monthly B12 injection per Dr. Raoul Pitch.  B12-I would like him to start B12 injections by nurse visit once weekly for 4 weeks, then once every 2 weeks for 2 doses.  After which, we will need to see him back for follow-up on B12 deficiency and weakness with provider and we will recheck his B12 levels at that time.  B12 1071mcg given IM, and pt tolerated injection well.  Next B12 injection scheduled for 1 week

## 2021-06-25 ENCOUNTER — Ambulatory Visit: Payer: Medicare Other | Admitting: Family Medicine

## 2021-06-25 LAB — PROTEIN ELECTROPHORESIS, SERUM
Albumin ELP: 4.1 g/dL (ref 3.8–4.8)
Alpha 1: 0.3 g/dL (ref 0.2–0.3)
Alpha 2: 0.7 g/dL (ref 0.5–0.9)
Beta 2: 0.3 g/dL (ref 0.2–0.5)
Beta Globulin: 0.5 g/dL (ref 0.4–0.6)
Gamma Globulin: 1.1 g/dL (ref 0.8–1.7)
Total Protein: 7 g/dL (ref 6.1–8.1)

## 2021-06-25 LAB — IRON,TIBC AND FERRITIN PANEL
%SAT: 30 % (calc) (ref 20–48)
Ferritin: 209 ng/mL (ref 24–380)
Iron: 103 ug/dL (ref 50–180)
TIBC: 349 mcg/dL (calc) (ref 250–425)

## 2021-06-25 LAB — EXTRA LAV TOP TUBE

## 2021-06-25 LAB — URINE CULTURE
MICRO NUMBER:: 12517605
Result:: NO GROWTH
SPECIMEN QUALITY:: ADEQUATE

## 2021-06-25 LAB — EXTRA URINE SPECIMEN

## 2021-06-27 ENCOUNTER — Encounter: Payer: Self-pay | Admitting: Family Medicine

## 2021-06-28 ENCOUNTER — Encounter: Payer: Self-pay | Admitting: Family Medicine

## 2021-06-28 ENCOUNTER — Ambulatory Visit (INDEPENDENT_AMBULATORY_CARE_PROVIDER_SITE_OTHER): Payer: Medicare Other | Admitting: Family Medicine

## 2021-06-28 ENCOUNTER — Other Ambulatory Visit: Payer: Self-pay

## 2021-06-28 VITALS — BP 128/76 | HR 75 | Temp 97.4°F | Wt 186.0 lb

## 2021-06-28 DIAGNOSIS — M545 Low back pain, unspecified: Secondary | ICD-10-CM

## 2021-06-28 DIAGNOSIS — W19XXXA Unspecified fall, initial encounter: Secondary | ICD-10-CM

## 2021-06-28 DIAGNOSIS — D6869 Other thrombophilia: Secondary | ICD-10-CM | POA: Diagnosis not present

## 2021-06-28 MED ORDER — CYCLOBENZAPRINE HCL 5 MG PO TABS: 5.0000 mg | ORAL_TABLET | Freq: Two times a day (BID) | ORAL | 1 refills | Status: DC | PRN

## 2021-06-28 MED ORDER — PREDNISONE 20 MG PO TABS: ORAL_TABLET | ORAL | 0 refills | Status: DC

## 2021-06-28 NOTE — Telephone Encounter (Signed)
Appt changed to 10 due to cancellation

## 2021-06-28 NOTE — Telephone Encounter (Signed)
FYI  Pt scheduled appt @ 2:15

## 2021-06-28 NOTE — Patient Instructions (Signed)
Great to see you today.  I have refilled the medication(s) we provide.   If labs were collected, we will inform you of lab results once received either by echart message or telephone call.   - echart message- for normal results that have been seen by the patient already.   - telephone call: abnormal results or if patient has not viewed results in their echart.   Start flexeril up to 2 a day.  Start prednisone taper.   I do not think you need you xrays.  Rest and heat. Start stretches in a few days   Lumbosacral Strain Lumbosacral strain is an injury that causes pain in the lower back (lumbosacral spine). This injury usually happens from overstretching the muscles or ligaments along your spine. Ligaments are cord-like tissues that connect bones to other bones. A strain can affect one or more muscles or ligaments. What are the causes? This condition may be caused by: A hard, direct hit to the back. Overstretching the lower back muscles. This may result from: A fall. Lifting something heavy. Repetitive movements such as bending or crouching. What increases the risk? The following factors may make you more likely to develop this condition: Participating in sports or activities that involve: A sudden twist of the back. Pushing or pulling motions. Being overweight or obese. Having poor strength and flexibility, especially tight hamstrings or weak muscles in the back or abdomen. Having too much of a curve in the lower back. Having a pelvis that is tilted forward. What are the signs or symptoms? The main symptom of this condition is pain in the lower back, at the site of the strain. Pain may also be felt down one or both legs. How is this diagnosed? This condition is diagnosed based on your symptoms, your medical history, and a physical exam. During the physical exam, your health care provider may push on certain areas of your back to find the source of your pain. You may be asked to  bend forward, backward, and side to side to check your pain and range of motion. You may also have imaging tests, such as X-rays and an MRI. How is this treated? This condition may be treated by: Applying heat and cold on the affected area. Taking medicines to help relieve pain and relax your muscles. Taking NSAIDs, such as ibuprofen, to help reduce swelling and discomfort. Doing stretching and strengthening exercises for your lower back. Symptoms usually improve within several weeks of treatment. However, recovery time varies. When your symptoms improve, gradually return to your normal routine as soon as possible to reduce pain, avoid stiffness, and keep muscle strength. Follow these instructions at home: Medicines Take over-the-counter and prescription medicines only as told by your health care provider. Ask your health care provider if the medicine prescribed to you: Requires you to avoid driving or using heavy machinery. Can cause constipation. You may need to take these actions to prevent or treat constipation: Drink enough fluid to keep your urine pale yellow. Take over-the-counter or prescription medicines. Eat foods that are high in fiber, such as beans, whole grains, and fresh fruits and vegetables. Limit foods that are high in fat and processed sugars, such as fried or sweet foods. Managing pain, stiffness, and swelling   If directed, put ice on the injured area. To do this: Put ice in a plastic bag. Place a towel between your skin and the bag. Leave the ice on for 20 minutes, 2-3 times a day. If directed, apply  heat on the affected area as often as told by your health care provider. Use the heat source that your health care provider recommends, such as a moist heat pack or a heating pad. Place a towel between your skin and the heat source. Leave the heat on for 20-30 minutes. Remove the heat if your skin turns bright red. This is especially important if you are unable to feel  pain, heat, or cold. You may have a greater risk of getting burned. Activity Rest as told by your health care provider. Do not stay in bed. Staying in bed for more than 1-2 days can delay your recovery. Return to your normal activities as told by your health care provider. Ask your health care provider what activities are safe for you. Avoid activities that take a lot of energy for as long as told by your health care provider. Do exercises as told by your health care provider. This includes stretching and strengthening exercises. General instructions Sit up and stand up straight. Avoid leaning forward when you sit, or hunching over when you stand. Do not use any products that contain nicotine or tobacco, such as cigarettes, e-cigarettes, and chewing tobacco. If you need help quitting, ask your health care provider. Keep all follow-up visits as told by your health care provider. This is important. How is this prevented?  Use correct form when playing sports and lifting heavy objects. Use good posture when sitting and standing. Maintain a healthy weight. Sleep on a mattress with medium firmness to support your back. Do at least 150 minutes of moderate-intensity exercise each week, such as brisk walking or water aerobics. Try a form of exercise that takes stress off your back, such as swimming or stationary cycling. Maintain physical fitness, including: Strength. Flexibility. Contact a health care provider if: Your back pain does not improve after several weeks of treatment. Your symptoms get worse. Get help right away if: Your back pain is severe. You cannot stand or walk. You have difficulty controlling when you urinate or when you have a bowel movement. You feel nauseous or you vomit. Your feet or legs get very cold, turn pale, or look blue. You have numbness, tingling, weakness, or problems using your arms or legs. You develop any of the following: Shortness of  breath. Dizziness. Pain in your legs. Weakness in your buttocks or legs. Summary Lumbosacral strain is an injury that causes pain in the lower back (lumbosacral spine). This injury usually happens from overstretching the muscles or ligaments along your spine. This condition may be caused by a direct hit to the lower back or by overstretching the lower back muscles. Symptoms usually improve within several weeks of treatment. This information is not intended to replace advice given to you by your health care provider. Make sure you discuss any questions you have with your health care provider. Document Revised: 01/15/2019 Document Reviewed: 01/15/2019 Elsevier Patient Education  [place].

## 2021-06-28 NOTE — Progress Notes (Signed)
This visit occurred during the SARS-CoV-2 public health emergency.  Safety protocols were in place, including screening questions prior to the visit, additional usage of staff PPE, and extensive cleaning of exam room while observing appropriate contact time as indicated for disinfecting solutions.    Mike Day , Jan 29, 1946, 75 y.o., male MRN: [id number] Patient Care Team    Relationship Specialty Notifications Start End  Ma Hillock, DO PCP - General Family Medicine  06/11/18   Sherren Mocha, MD PCP - Cardiology Cardiology  08/21/20   Rod Can, MD Consulting Physician Orthopedic Surgery  10/03/15   Sharmon Revere Physician Assistant Cardiology  04/29/16   Rolm Bookbinder, MD Consulting Physician Dermatology  08/24/17   Myrlene Broker, MD Attending Physician Urology  06/11/18   Warden Fillers, MD Consulting Physician Ophthalmology  06/11/18   Rosemarie Ax, MD Consulting Physician Sports Medicine  01/21/20     Chief Complaint  Patient presents with   Fall    Pt reports fall landing on back; pt reports LBP and HA(chronic); denies pain in legs     Subjective: Pt presents for an OV with complaints of lower back pain along his belt line after tripping in the parking lot 2 days ago.  He states he fell on a mix of grassy/gravel area.  He denies any wounds or bleeding.  He does not believe he had any bruising.  He is on a blood thinner.  He denies any bladder, bowel dysfunction or radiation of pain to buttocks or lower extremity.  He states the pain is all along his beltline lower back and feels more like a stiffness.  He tried Tylenol over the weekend which was not effective.  Depression screen [facility] 2/9 06/22/2021 08/18/2020 02/26/2019 06/05/2018 08/24/2017  Decreased Interest 0 0 0 0 0  Down, Depressed, Hopeless 0 0 0 0 0  PHQ - 2 Score 0 0 0 0 0  Some recent data might be hidden    Allergies  Allergen Reactions   Levofloxacin Rash   Neosporin  [Neomycin-Bacitracin Zn-Polymyx] Other (See Comments)    Dry/scaly skin/blistering    Tape Other (See Comments)    ADHESIVE TAPE; BLISTERS,   Social History   Social History Narrative   Married, 1 son, 2 grand-daughters.   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   Alcohol: 1 glass red wine per night.   Was once a long distance runner.   Past Medical History:  Diagnosis Date   Anemia    Early 20's   Atrial fibrillation ([place]) 10/07/2014   3-14 day cardiac monitor 03/2019:  AFib, Avg HR 69, no significant pauses, PVCs, wide complex runs (aberrancy vs NSVT - longest 32 beats).   Atrial flutter ([place]) 2008   Status post RFCA Dr Cristopher Peru 2008   Carotid artery plaque    Carotid US 8/21: Bilat ICA 1-39   Chronic atrial fibrillation (HCC)    Eliquis   Closed nondisplaced fracture of left patella 01/20/2020   Dermatochalasis of both upper eyelids 07/01/2014   Dysrhythmia    a fib    Echocardiograms    Echo 12/14 - Severe basal septal hypertrophy, moderate LVH, EF 60-65%, moderate LAE, mild RAE // Echo 7/20: EF 60-65, severe asymmetric LVH, mild RVE, normal RV SF, moderate BAE, moderate MAC, moderate aortic valve calcification (noncoronary cusp), mild dilation of ascending aorta (41 mm)   Hx of adenomatous  colonic polyps    Hyperlipidemia    Hypertension    Malignant neoplasm of prostate ([place]) 05/18/2012   Neuromuscular disorder ([place])    neuropathy feet   Patellar sleeve fracture of left knee 2021   Prostate cancer ([place]) 2006   Prostatectomy   Shingles 01/2019   left side of face/eye/neck/ear    SKIN CANCER, HX OF 03/11/2009   Moh's L hand for Squamous Cell;2 other squamous cells removed w/o Mohs 2013 Rolm Bookbinder MD  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   Past Surgical History:  Procedure Laterality Date   BLADDER SUSPENSION  2010   COLONOSCOPY W/ POLYPECTOMY  2003;2010;04/2014   Tubular adenoma: recall 5 yrs (after 04/2019)    EYE SURGERY Bilateral 2021,2022   HERNIA REPAIR  07/2008   Dr.Martin   INGUINAL HERNIA REPAIR Left 08/25/2020   Procedure: OPEN LEFT INGUINAL HERNIA REPAIR WITH MESH;  Surgeon: Johnathan Hausen, MD;  Location: [facility];  Service: General;  Laterality: Left;   MOHS SURGERY     left hand   MOHS SURGERY Left 12/2020   ear   PROSTATECTOMY  2006   Robotic for adenocarcinoma; Dr Lawerance Bach   RADIOFREQUENCY ABLATION  01/15/2007   for ectopic atrial foci   SEPTOPLASTY  Age 81   TRANSTHORACIC ECHOCARDIOGRAM  09/02/2013   Normal.  EF 60%.  LAE and RAE.   XI ROBOTIC ASSISTED INGUINAL HERNIA REPAIR WITH MESH Right 01/11/2021   Procedure: XI ROBOTIC ASSISTED  RIGHT INGUINAL HERNIA REPAIR WITH MESH;  Surgeon: Johnathan Hausen, MD;  Location: [facility];  Service: General;  Laterality: Right;   Family History  Problem Relation Age of Onset   Lung disease Father        Black Lung   Heart disease Father        Congenital   Emphysema Father    COPD Father    Diabetes Mother    Stroke Mother 47   Hypertension Brother    Heart disease Brother    Hyperlipidemia Brother    Heart attack Brother 64       sudden death   Cancer Neg Hx    Colon cancer Neg Hx    Esophageal cancer Neg Hx    Rectal cancer Neg Hx    Stomach cancer Neg Hx    Allergies as of 06/28/2021       Reactions   Levofloxacin Rash   Neosporin [neomycin-bacitracin Zn-polymyx] Other (See Comments)   Dry/scaly skin/blistering    Tape Other (See Comments)   ADHESIVE TAPE; BLISTERS,        Medication List        Accurate as of June 28, 2021 11:56 AM. If you have any questions, ask your nurse or doctor.          acetaminophen 500 MG tablet Commonly known as: TYLENOL Take 1,000 mg by mouth every 8 (eight) hours as needed for moderate pain.   amLODipine 2.5 MG tablet Commonly known as: NORVASC TAKE 1 TABLET TWICE A DAY What changed:  how much to take how to take this when to take this   ARTIFICIAL TEAR SOLUTION  OP Place 1 drop into both eyes daily as needed (dry eyes).   cetaphil cream Apply 1 application topically daily.   cholecalciferol 25 MCG (1000 UNIT) tablet Commonly known as: VITAMIN D3 Take 1 tablet (1,000 Units total) by mouth daily.   Cyanocobalamin 1000 MCG Subl 1000 mg sublingual daily.   cyclobenzaprine 5 MG  tablet Commonly known as: FLEXERIL Take 1 tablet (5 mg total) by mouth 2 (two) times daily as needed for muscle spasms. Started by: Howard Pouch, DO   Eliquis 5 MG Tabs tablet Generic drug: apixaban TAKE 1 TABLET TWICE A DAY   fluorouracil 5 % cream Commonly known as: EFUDEX SMARTSIG:Sparingly Topical Twice Daily   lisinopril 20 MG tablet Commonly known as: ZESTRIL TAKE 1 TABLET DAILY (NOTE  DOSE CHANGE)   predniSONE 20 MG tablet Commonly known as: DELTASONE 60 mg x3d, 40 mg x3d, 20 mg x2d, 10 mg x2d Started by: Howard Pouch, DO   simvastatin 20 MG tablet Commonly known as: ZOCOR TAKE 1 TABLET AT BEDTIME   triamcinolone 55 MCG/ACT Aero nasal inhaler Commonly known as: NASACORT Place 1 spray into the nose daily as needed (allergies).        All past medical history, surgical history, allergies, family history, immunizations andmedications were updated in the EMR today and reviewed under the history and medication portions of their EMR.     ROS: Negative, with the exception of above mentioned in HPI   Objective:  BP 128/76   Pulse 75   Temp (!) 97.4 F (36.3 C) (Oral)   Wt 186 lb (84.4 kg)   SpO2 98%   BMI 23.25 kg/m  Body mass index is 23.25 kg/m. Gen: Afebrile. No acute distress. Nontoxic in appearance, well developed, well nourished.  HENT: AT. [place]. Eyes:Pupils Equal Round Reactive to light, Extraocular movements intact,  Conjunctiva without redness, discharge or icterus. MSK (lumbar spine): No erythema, no soft tissue swelling.  No bruising.  No bony tenderness of her lumbar spine, sacrum or iliac bony prominences.  Mild tenderness to  palpation bilateral SI joints.  Ropiness lumbar paraspinal muscles bilaterally. Skin: No bruising, no hematoma, no rashes, purpura or petechiae.  Neuro: Normal gait. PERLA. EOMi. Alert. Oriented x3  Psych: Normal affect, dress and demeanor. Normal speech. Normal thought content and judgment.  No results found. No results found. No results found for this or any previous visit (from the past 24 hour(s)).  Assessment/Plan: Mike Day is a 75 y.o. male present for OV for  Acquired thrombophilia (HCC)-Eliquis No bruising or hematoma formation.  Fall, initial encounter/Lumbar pain Bilateral lumbar paraspinal muscles and bilateral SI joints mildly tender to palpation today.  Do not suspect fracture since he has no bony tenderness. Rest, heat application encouraged. Start prednisone taper Flexeril twice daily prescribed as needed Follow-up in 2 weeks if symptoms are continuously improving, or worsening.  Reviewed expectations re: course of current medical issues. Discussed self-management of symptoms. Outlined signs and symptoms indicating need for more acute intervention. Patient verbalized understanding and all questions were answered. Patient received an After-Visit Summary.    No orders of the defined types were placed in this encounter.  Meds ordered this encounter  Medications   predniSONE (DELTASONE) 20 MG tablet    Sig: 60 mg x3d, 40 mg x3d, 20 mg x2d, 10 mg x2d    Dispense:  18 tablet    Refill:  0   cyclobenzaprine (FLEXERIL) 5 MG tablet    Sig: Take 1 tablet (5 mg total) by mouth 2 (two) times daily as needed for muscle spasms.    Dispense:  30 tablet    Refill:  1   Referral Orders  No referral(s) requested today     Note is dictated utilizing voice recognition software. Although note has been proof read prior to signing, occasional typographical errors still can  be missed. If any questions arise, please do not hesitate to call for verification.    electronically signed by:  Howard Pouch, DO  [place]

## 2021-07-01 ENCOUNTER — Ambulatory Visit (INDEPENDENT_AMBULATORY_CARE_PROVIDER_SITE_OTHER): Payer: Medicare Other

## 2021-07-01 ENCOUNTER — Encounter: Payer: Self-pay | Admitting: Family Medicine

## 2021-07-01 ENCOUNTER — Other Ambulatory Visit: Payer: Self-pay

## 2021-07-01 DIAGNOSIS — W19XXXA Unspecified fall, initial encounter: Secondary | ICD-10-CM

## 2021-07-01 DIAGNOSIS — E538 Deficiency of other specified B group vitamins: Secondary | ICD-10-CM | POA: Diagnosis not present

## 2021-07-01 DIAGNOSIS — M545 Low back pain, unspecified: Secondary | ICD-10-CM

## 2021-07-01 NOTE — Progress Notes (Signed)
Per the orders of Dr. Raoul Pitch pt is here for B12 injection. Pt received B12 in left deltoid at 9:30 am, given by [place] CMA/CPT. Pt tolerated injection well. Pt is to return in around 1 week for next injection.

## 2021-07-01 NOTE — Telephone Encounter (Signed)
I have placed an x-ray for him to obtain at [facility] today to ensure no fractures present. Continue prednisone Continue Flexeril Continue heat application a couple times a day Can try over-the-counter salponas patches at the location of discomfort.  If pain is worsening and he requires a pain medication, we could consider tramadol as needed for a few days.  However this is a controlled substance so we would have to at least connect with him virtually to legally prescribe.

## 2021-07-01 NOTE — Telephone Encounter (Signed)
Please advise 

## 2021-07-02 ENCOUNTER — Ambulatory Visit (HOSPITAL_BASED_OUTPATIENT_CLINIC_OR_DEPARTMENT_OTHER)
Admission: RE | Admit: 2021-07-02 | Discharge: 2021-07-02 | Disposition: A | Discharge status: Home / Self Care | Payer: Medicare Other | Source: Ambulatory Visit | Attending: Family Medicine | Admitting: Family Medicine

## 2021-07-02 DIAGNOSIS — M545 Low back pain, unspecified: Secondary | ICD-10-CM | POA: Insufficient documentation

## 2021-07-02 DIAGNOSIS — M533 Sacrococcygeal disorders, not elsewhere classified: Secondary | ICD-10-CM | POA: Insufficient documentation

## 2021-07-02 DIAGNOSIS — W19XXXA Unspecified fall, initial encounter: Secondary | ICD-10-CM | POA: Insufficient documentation

## 2021-07-02 DIAGNOSIS — M438X6 Other specified deforming dorsopathies, lumbar region: Secondary | ICD-10-CM | POA: Insufficient documentation

## 2021-07-02 NOTE — Telephone Encounter (Signed)
Spoke with pt regarding provider's instructions. Pt states he has leftover tramadol from his knee he can use those instead. He also states he has already been using the salponas patches.

## 2021-07-05 ENCOUNTER — Encounter: Payer: Self-pay | Admitting: Family Medicine

## 2021-07-05 ENCOUNTER — Telehealth: Payer: Self-pay | Admitting: Family Medicine

## 2021-07-05 NOTE — Telephone Encounter (Signed)
-----   Message from Kavin Leech, [place] sent at 07/05/2021  4:19 PM EDT ----- Spoke with pt regarding results and instructions. Pt states the is worsening and would like to know if anything else can be done

## 2021-07-05 NOTE — Telephone Encounter (Signed)
Pt agreed to appt.

## 2021-07-05 NOTE — Telephone Encounter (Signed)
FYI

## 2021-07-05 NOTE — Telephone Encounter (Signed)
Result will be read soon

## 2021-07-05 NOTE — Telephone Encounter (Signed)
Can we give radiology a call on those results please. They were completed 10/28- but not read.  Thanks.

## 2021-07-05 NOTE — Telephone Encounter (Signed)
Please schedule him tomorrow then for re-eval. 4pm slot

## 2021-07-05 NOTE — Telephone Encounter (Signed)
Pt called to f/u on results as well. Call back # [phone]

## 2021-07-06 ENCOUNTER — Other Ambulatory Visit: Payer: Self-pay

## 2021-07-06 ENCOUNTER — Ambulatory Visit (INDEPENDENT_AMBULATORY_CARE_PROVIDER_SITE_OTHER): Payer: Medicare Other | Admitting: Family Medicine

## 2021-07-06 ENCOUNTER — Encounter: Payer: Self-pay | Admitting: Family Medicine

## 2021-07-06 VITALS — BP 124/78 | HR 91 | Temp 97.9°F | Wt 181.0 lb

## 2021-07-06 DIAGNOSIS — M545 Low back pain, unspecified: Secondary | ICD-10-CM | POA: Diagnosis not present

## 2021-07-06 MED ORDER — TIZANIDINE HCL 2 MG PO CAPS: 2.0000 mg | ORAL_CAPSULE | Freq: Three times a day (TID) | ORAL | 0 refills | Status: DC

## 2021-07-06 MED ORDER — TRAMADOL HCL 50 MG PO TABS: 50.0000 mg | ORAL_TABLET | Freq: Two times a day (BID) | ORAL | 0 refills | Status: AC | PRN

## 2021-07-06 NOTE — Progress Notes (Signed)
This visit occurred during the SARS-CoV-2 public health emergency.  Safety protocols were in place, including screening questions prior to the visit, additional usage of staff PPE, and extensive cleaning of exam room while observing appropriate contact time as indicated for disinfecting solutions.    Mike Day , Oct 15, 1945, 75 y.o., male MRN: [id number] Patient Care Team    Relationship Specialty Notifications Start End  Ma Hillock, DO PCP - General Family Medicine  06/11/18   Sherren Mocha, MD PCP - Cardiology Cardiology  08/21/20   Rod Can, MD Consulting Physician Orthopedic Surgery  10/03/15   Sharmon Revere Physician Assistant Cardiology  04/29/16   Rolm Bookbinder, MD Consulting Physician Dermatology  08/24/17   Myrlene Broker, MD Attending Physician Urology  06/11/18   Warden Fillers, MD Consulting Physician Ophthalmology  06/11/18   Rosemarie Ax, MD Consulting Physician Sports Medicine  01/21/20     Chief Complaint  Patient presents with   Back Pain    Pt reports unchanged back pain x 2 weeks; pt reports some stiffness in neck      Subjective: Mike Day is a 75 y.o. male presents today to follow-up on his back pain.  He was seen 06/28/2021 after he had a fall in a parking lot in which he fell on his back.  He initially reported some discomfort and stiffness along the bottom of his lumbar spine and had been tender over the SI joints during exam without any bony tenderness.  Patient was prescribed prednisone, Flexeril and encouraged to use heat application to help with muscle relaxation.  He has been taking Tylenol for discomfort.  He had called in reporting that his pain has not been improving much and was mostly near his lower back and sacrum area.  X-ray was ordered prior to appointment today which resulted with a L1 compression deformity of unknown age.  Patient reports he has no pain over this portion of his back.  He denies any radiation of  pain down his legs or bladder or bowel dysfunction.  Prior note: Pt presents for an OV with complaints of lower back pain along his belt line after tripping in the parking lot 2 days ago.  He states he fell on a mix of grassy/gravel area.  He denies any wounds or bleeding.  He does not believe he had any bruising.  He is on a blood thinner.  He denies any bladder, bowel dysfunction or radiation of pain to buttocks or lower extremity.  He states the pain is all along his beltline lower back and feels more like a stiffness.  He tried Tylenol over the weekend which was not effective.  Depression screen [facility] 2/9 06/22/2021 08/18/2020 02/26/2019 06/05/2018 08/24/2017  Decreased Interest 0 0 0 0 0  Down, Depressed, Hopeless 0 0 0 0 0  PHQ - 2 Score 0 0 0 0 0  Some recent data might be hidden    Allergies  Allergen Reactions   Levofloxacin Rash   Neosporin [Neomycin-Bacitracin Zn-Polymyx] Other (See Comments)    Dry/scaly skin/blistering    Tape Other (See Comments)    ADHESIVE TAPE; BLISTERS,   Social History   Social History Narrative   Married, 1 son, 2 grand-daughters.   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   Alcohol: 1 glass red wine per night.   Was once a long distance runner.   Past Medical  History:  Diagnosis Date   Anemia    Early 20's   Atrial fibrillation ([place]) 10/07/2014   3-14 day cardiac monitor 03/2019:  AFib, Avg HR 69, no significant pauses, PVCs, wide complex runs (aberrancy vs NSVT - longest 32 beats).   Atrial flutter ([place]) 2008   Status post RFCA Dr Cristopher Peru 2008   Carotid artery plaque    Carotid US 8/21: Bilat ICA 1-39   Chronic atrial fibrillation ([place])    Eliquis   Closed nondisplaced fracture of left patella 01/20/2020   Dermatochalasis of both upper eyelids 07/01/2014   Dysrhythmia    a fib    Echocardiograms    Echo 12/14 - Severe basal septal hypertrophy, moderate LVH, EF 60-65%, moderate LAE, mild RAE  // Echo 7/20: EF 60-65, severe asymmetric LVH, mild RVE, normal RV SF, moderate BAE, moderate MAC, moderate aortic valve calcification (noncoronary cusp), mild dilation of ascending aorta (41 mm)   Hx of adenomatous colonic polyps    Hyperlipidemia    Hypertension    Malignant neoplasm of prostate ([place]) 05/18/2012   Neuromuscular disorder ([place])    neuropathy feet   Patellar sleeve fracture of left knee 2021   Prostate cancer ([place]) 2006   Prostatectomy   Shingles 01/2019   left side of face/eye/neck/ear    SKIN CANCER, HX OF 03/11/2009   Moh's L hand for Squamous Cell;2 other squamous cells removed w/o Mohs 2013 Rolm Bookbinder MD  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   Past Surgical History:  Procedure Laterality Date   BLADDER SUSPENSION  2010   COLONOSCOPY W/ POLYPECTOMY  2003;2010;04/2014   Tubular adenoma: recall 5 yrs (after 04/2019)   EYE SURGERY Bilateral 2021,2022   HERNIA REPAIR  07/2008   Dr.Martin   INGUINAL HERNIA REPAIR Left 08/25/2020   Procedure: OPEN LEFT INGUINAL HERNIA REPAIR WITH MESH;  Surgeon: Johnathan Hausen, MD;  Location: [facility];  Service: General;  Laterality: Left;   MOHS SURGERY     left hand   MOHS SURGERY Left 12/2020   ear   PROSTATECTOMY  2006   Robotic for adenocarcinoma; Dr Lawerance Bach   RADIOFREQUENCY ABLATION  01/15/2007   for ectopic atrial foci   SEPTOPLASTY  Age 44   TRANSTHORACIC ECHOCARDIOGRAM  09/02/2013   Normal.  EF 60%.  LAE and RAE.   XI ROBOTIC ASSISTED INGUINAL HERNIA REPAIR WITH MESH Right 01/11/2021   Procedure: XI ROBOTIC ASSISTED  RIGHT INGUINAL HERNIA REPAIR WITH MESH;  Surgeon: Johnathan Hausen, MD;  Location: [facility];  Service: General;  Laterality: Right;   Family History  Problem Relation Age of Onset   Lung disease Father        Black Lung   Heart disease Father        Congenital   Emphysema Father    COPD Father    Diabetes Mother    Stroke Mother 23   Hypertension Brother    Heart disease Brother     Hyperlipidemia Brother    Heart attack Brother 65       sudden death   Cancer Neg Hx    Colon cancer Neg Hx    Esophageal cancer Neg Hx    Rectal cancer Neg Hx    Stomach cancer Neg Hx    Allergies as of 07/06/2021       Reactions   Levofloxacin Rash   Neosporin [neomycin-bacitracin Zn-polymyx] Other (See Comments)   Dry/scaly skin/blistering    Tape Other (See  Comments)   ADHESIVE TAPE; BLISTERS,        Medication List        Accurate as of July 06, 2021 11:59 PM. If you have any questions, ask your nurse or doctor.          STOP taking these medications    cyclobenzaprine 5 MG tablet Commonly known as: FLEXERIL Stopped by: Howard Pouch, DO       TAKE these medications    acetaminophen 500 MG tablet Commonly known as: TYLENOL Take 1,000 mg by mouth every 8 (eight) hours as needed for moderate pain.   amLODipine 2.5 MG tablet Commonly known as: NORVASC TAKE 1 TABLET TWICE A DAY What changed:  how much to take how to take this when to take this   ARTIFICIAL TEAR SOLUTION OP Place 1 drop into both eyes daily as needed (dry eyes).   cetaphil cream Apply 1 application topically daily.   cholecalciferol 25 MCG (1000 UNIT) tablet Commonly known as: VITAMIN D3 Take 1 tablet (1,000 Units total) by mouth daily.   Cyanocobalamin 1000 MCG Subl 1000 mg sublingual daily.   Eliquis 5 MG Tabs tablet Generic drug: apixaban TAKE 1 TABLET TWICE A DAY   fluorouracil 5 % cream Commonly known as: EFUDEX SMARTSIG:Sparingly Topical Twice Daily   lisinopril 20 MG tablet Commonly known as: ZESTRIL TAKE 1 TABLET DAILY (NOTE  DOSE CHANGE)   predniSONE 20 MG tablet Commonly known as: DELTASONE 60 mg x3d, 40 mg x3d, 20 mg x2d, 10 mg x2d   simvastatin 20 MG tablet Commonly known as: ZOCOR TAKE 1 TABLET AT BEDTIME   tizanidine 2 MG capsule Commonly known as: Zanaflex Take 1 capsule (2 mg total) by mouth 3 (three) times daily. Started by: Howard Pouch, DO    traMADol 50 MG tablet Commonly known as: ULTRAM Take 1-2 tablets (50-100 mg total) by mouth every 12 (twelve) hours as needed for up to 5 days. Started by: Howard Pouch, DO   triamcinolone 55 MCG/ACT Aero nasal inhaler Commonly known as: NASACORT Place 1 spray into the nose daily as needed (allergies).        All past medical history, surgical history, allergies, family history, immunizations andmedications were updated in the EMR today and reviewed under the history and medication portions of their EMR.     ROS: Negative, with the exception of above mentioned in HPI   Objective:  BP 124/78   Pulse 91   Temp 97.9 F (36.6 C) (Oral)   Wt 181 lb (82.1 kg)   SpO2 99%   BMI 22.62 kg/m  Body mass index is 22.62 kg/m. Gen: Afebrile. No acute distress.  Nontoxic, very pleasant male. HENT: AT. [place].  Eyes:Pupils Equal Round Reactive to light, Extraocular movements intact,  Conjunctiva without redness, discharge or icterus. MSK:  Lumbar/sacrum: No erythema, no bruising no bony tenderness lower thoracic, lumbar or sacrum today.  Mild discomfort over bilateral SI joints present but improved.  Full range of motion lumbar spine with mild discomfort on lumbar extension.  Neurovascularly intact distally. Skin: No rashes, purpura or petechiae.  Neuro: Normal gait. PERLA. EOMi. Alert. Orientedx3  No results found. No results found. No results found for this or any previous visit (from the past 24 hour(s)).  Assessment/Plan: Mike Day is a 75 y.o. male present for OV for  Acquired thrombophilia (HCC)-Eliquis No bruising or hematoma formation. NSAIDs contraindicated  Fall, subsequent encounter/Lumbar pain Overall, there is improvement on exam and in his movements today.  He still moving a little cautiously when changing positions between sitting and standing.  Reviewed x-ray results with him today.  He still denies any radiating pain or bone pain. He has completed his steroid  taper, he did not feel this was all that helpful.  Suspect most of his discomfort now is muscle strain. Continue heat application encouraged. Discontinue Flexeril twice daily prescribed as needed Start Zanaflex 2 mg twice daily Start lumbar physical therapy at [facility] Tramadol 50-100 mg twice daily as needed prescribed for severe pain or pain that may occur after physical therapy. Follow-up in 4 weeks if symptoms are continuously improving, sooner if worsening  Reviewed expectations re: course of current medical issues. Discussed self-management of symptoms. Outlined signs and symptoms indicating need for more acute intervention. Patient verbalized understanding and all questions were answered. Patient received an After-Visit Summary.    Orders Placed This Encounter  Procedures   Ambulatory referral to Physical Therapy    Meds ordered this encounter  Medications   tizanidine (ZANAFLEX) 2 MG capsule    Sig: Take 1 capsule (2 mg total) by mouth 3 (three) times daily.    Dispense:  90 capsule    Refill:  0   traMADol (ULTRAM) 50 MG tablet    Sig: Take 1-2 tablets (50-100 mg total) by mouth every 12 (twelve) hours as needed for up to 5 days.    Dispense:  20 tablet    Refill:  0   Referral Orders         Ambulatory referral to Physical Therapy       Note is dictated utilizing voice recognition software. Although note has been proof read prior to signing, occasional typographical errors still can be missed. If any questions arise, please do not hesitate to call for verification.   electronically signed by:  Howard Pouch, DO  [place]

## 2021-07-06 NOTE — Patient Instructions (Signed)
Start PT at [facility].  Stop flexeril. Star zanaflex (muscle relaxer) in its place.   If you need to, please take the tramadol.   Follow up in 4 weeks if not continuing to improve.   Lumbar Strain A lumbar strain, which is sometimes called a low-back strain, is a stretch or tear in a muscle or the strong cords of tissue that attach muscle to bone (tendons) in the lower back (lumbar spine). This type of injury occurs when muscles or tendons are torn or are stretched beyond their limits. Lumbar strains can range from mild to severe. Mild strains may involve stretching a muscle or tendon without tearing it. These may heal in 1-2 weeks. More severe strains involve tearing of muscle fibers or tendons. These will cause more pain and may take 6-8 weeks to heal. What are the causes? This condition may be caused by: Trauma, such as a fall or a hit to the body. Twisting or overstretching the back. This may result from doing activities that need a lot of energy, such as lifting heavy objects. What increases the risk? This injury is more common in: Athletes. People with obesity. People who do repeated lifting, bending, or other movements that involve their back. What are the signs or symptoms? Symptoms of this condition may include: Sharp or dull pain in the lower back that does not go away. The pain may extend to the buttocks. Stiffness or limited range of motion. Sudden muscle tightening (spasms). How is this diagnosed? This condition may be diagnosed based on: Your symptoms. Your medical history. A physical exam. Imaging tests, such as: X-rays. MRI. How is this treated? Treatment for this condition may include: Rest. Applying heat and cold to the affected area. Over-the-counter medicines to help relieve pain and inflammation, such as NSAIDs. Prescription pain medicine and muscle relaxants may be needed for a short time. Physical therapy. Follow these instructions at home: Managing  pain, stiffness, and swelling   If directed, put ice on the injured area during the first 24 hours after your injury. Put ice in a plastic bag. Place a towel between your skin and the bag. Leave the ice on for 20 minutes, 2-3 times a day. If directed, apply heat to the affected area as often as told by your health care provider. Use the heat source that your health care provider recommends, such as a moist heat pack or a heating pad. Place a towel between your skin and the heat source. Leave the heat on for 20-30 minutes. Remove the heat if your skin turns bright red. This is especially important if you are unable to feel pain, heat, or cold. You may have a greater risk of getting burned. Activity Rest and return to your normal activities as told by your health care provider. Ask your health care provider what activities are safe for you. Do exercises as told by your health care provider. Medicines Take over-the-counter and prescription medicines only as told by your health care provider. Ask your health care provider if the medicine prescribed to you: Requires you to avoid driving or using heavy machinery. Can cause constipation. You may need to take these actions to prevent or treat constipation: Drink enough fluid to keep your urine pale yellow. Take over-the-counter or prescription medicines. Eat foods that are high in fiber, such as beans, whole grains, and fresh fruits and vegetables. Limit foods that are high in fat and processed sugars, such as fried or sweet foods. Injury prevention To prevent  a future low-back injury: Always warm up properly before physical activity or sports. Cool down and stretch after being active. Use correct form when playing sports and lifting heavy objects. Bend your knees before you lift heavy objects. Use good posture when sitting and standing. Stay physically fit and keep a healthy weight. Do at least 150 minutes of moderate-intensity exercise each  week, such as brisk walking or water aerobics. Do strength exercises at least 2 times each week.  General instructions Do not use any products that contain nicotine or tobacco, such as cigarettes, e-cigarettes, and chewing tobacco. If you need help quitting, ask your health care provider. Keep all follow-up visits as told by your health care provider. This is important. Contact a health care provider if: Your back pain does not improve after 6 weeks of treatment. Your symptoms get worse. Get help right away if: Your back pain is severe. You are unable to stand or walk. You develop pain in your legs. You develop weakness in your buttocks or legs. You have difficulty controlling when you urinate or when you have a bowel movement. You have frequent, painful, or bloody urination. You have a temperature over 101.41F (38.3C) Summary A lumbar strain, which is sometimes called a low-back strain, is a stretch or tear in a muscle or the strong cords of tissue that attach muscle to bone (tendons) in the lower back (lumbar spine). This type of injury occurs when muscles or tendons are torn or are stretched beyond their limits. Rest and return to your normal activities as told by your health care provider. If directed, apply heat and ice to the affected area as often as told by your health care provider. Take over-the-counter and prescription medicines only as told by your health care provider. Contact a health care provider if you have new or worsening symptoms. This information is not intended to replace advice given to you by your health care provider. Make sure you discuss any questions you have with your health care provider. Document Revised: 06/21/2018 Document Reviewed: 06/21/2018 Elsevier Patient Education  2022 [employer].

## 2021-07-08 ENCOUNTER — Ambulatory Visit (INDEPENDENT_AMBULATORY_CARE_PROVIDER_SITE_OTHER): Payer: Medicare Other

## 2021-07-08 ENCOUNTER — Other Ambulatory Visit: Payer: Self-pay

## 2021-07-08 DIAGNOSIS — E538 Deficiency of other specified B group vitamins: Secondary | ICD-10-CM | POA: Diagnosis not present

## 2021-07-08 NOTE — Progress Notes (Signed)
Pt here for monthly B12 injection per Dr. Raoul Pitch.   B12-I would like him to start B12 injections by nurse visit once weekly for 4 weeks, then once every 2 weeks for 2 doses.  After which, we will need to see him back for follow-up on B12 deficiency and weakness with provider and we will recheck his B12 levels at that time.   B12 1055mcg given IM, and pt tolerated injection well.   Next B12 injection scheduled for 1 week

## 2021-07-14 DIAGNOSIS — M5459 Other low back pain: Secondary | ICD-10-CM | POA: Diagnosis not present

## 2021-07-14 DIAGNOSIS — R531 Weakness: Secondary | ICD-10-CM | POA: Diagnosis not present

## 2021-07-15 ENCOUNTER — Ambulatory Visit: Payer: Medicare Other

## 2021-07-16 ENCOUNTER — Other Ambulatory Visit: Payer: Self-pay

## 2021-07-16 ENCOUNTER — Ambulatory Visit (INDEPENDENT_AMBULATORY_CARE_PROVIDER_SITE_OTHER): Payer: Medicare Other

## 2021-07-16 DIAGNOSIS — E538 Deficiency of other specified B group vitamins: Secondary | ICD-10-CM | POA: Diagnosis not present

## 2021-07-16 DIAGNOSIS — M5459 Other low back pain: Secondary | ICD-10-CM | POA: Diagnosis not present

## 2021-07-16 DIAGNOSIS — R531 Weakness: Secondary | ICD-10-CM | POA: Diagnosis not present

## 2021-07-16 NOTE — Progress Notes (Signed)
Pt here for monthly B12 injection per Dr. Raoul Pitch.   B12-I would like him to start B12 injections by nurse visit once weekly for 4 weeks, then once every 2 weeks for 2 doses.  After which, we will need to see him back for follow-up on B12 deficiency and weakness with provider and we will recheck his B12 levels at that time.   B12 1032mcg given IM, and pt tolerated injection well.   Next B12 injection scheduled for 2 week  Please sign in absence of PCP

## 2021-07-20 DIAGNOSIS — M5459 Other low back pain: Secondary | ICD-10-CM | POA: Diagnosis not present

## 2021-07-20 DIAGNOSIS — R531 Weakness: Secondary | ICD-10-CM | POA: Diagnosis not present

## 2021-07-23 DIAGNOSIS — M5459 Other low back pain: Secondary | ICD-10-CM | POA: Diagnosis not present

## 2021-07-23 DIAGNOSIS — R531 Weakness: Secondary | ICD-10-CM | POA: Diagnosis not present

## 2021-07-26 DIAGNOSIS — M5459 Other low back pain: Secondary | ICD-10-CM | POA: Diagnosis not present

## 2021-07-26 DIAGNOSIS — R531 Weakness: Secondary | ICD-10-CM | POA: Diagnosis not present

## 2021-07-28 ENCOUNTER — Ambulatory Visit (INDEPENDENT_AMBULATORY_CARE_PROVIDER_SITE_OTHER): Payer: Medicare Other

## 2021-07-28 ENCOUNTER — Other Ambulatory Visit: Payer: Self-pay

## 2021-07-28 DIAGNOSIS — R531 Weakness: Secondary | ICD-10-CM | POA: Diagnosis not present

## 2021-07-28 DIAGNOSIS — E538 Deficiency of other specified B group vitamins: Secondary | ICD-10-CM | POA: Diagnosis not present

## 2021-07-28 DIAGNOSIS — M5459 Other low back pain: Secondary | ICD-10-CM | POA: Diagnosis not present

## 2021-07-28 MED ORDER — CYANOCOBALAMIN 1000 MCG/ML IJ SOLN
1000.0000 ug | Freq: Once | INTRAMUSCULAR | Status: AC
  Administered 2021-07-28: 1000 ug via INTRAMUSCULAR

## 2021-08-02 DIAGNOSIS — R531 Weakness: Secondary | ICD-10-CM | POA: Diagnosis not present

## 2021-08-02 DIAGNOSIS — M5459 Other low back pain: Secondary | ICD-10-CM | POA: Diagnosis not present

## 2021-08-05 DIAGNOSIS — M5459 Other low back pain: Secondary | ICD-10-CM | POA: Diagnosis not present

## 2021-08-05 DIAGNOSIS — R531 Weakness: Secondary | ICD-10-CM | POA: Diagnosis not present

## 2021-08-10 DIAGNOSIS — Z20822 Contact with and (suspected) exposure to covid-19: Secondary | ICD-10-CM | POA: Diagnosis not present

## 2021-08-10 DIAGNOSIS — R531 Weakness: Secondary | ICD-10-CM | POA: Diagnosis not present

## 2021-08-10 DIAGNOSIS — M5459 Other low back pain: Secondary | ICD-10-CM | POA: Diagnosis not present

## 2021-08-11 ENCOUNTER — Other Ambulatory Visit: Payer: Self-pay

## 2021-08-11 ENCOUNTER — Ambulatory Visit: Payer: Medicare Other

## 2021-08-11 ENCOUNTER — Ambulatory Visit (INDEPENDENT_AMBULATORY_CARE_PROVIDER_SITE_OTHER): Payer: Medicare Other

## 2021-08-11 DIAGNOSIS — E538 Deficiency of other specified B group vitamins: Secondary | ICD-10-CM

## 2021-08-11 MED ORDER — CYANOCOBALAMIN 1000 MCG/ML IJ SOLN
1000.0000 ug | Freq: Once | INTRAMUSCULAR | Status: AC
  Administered 2021-08-11: 1000 ug via INTRAMUSCULAR

## 2021-08-11 NOTE — Progress Notes (Signed)
Per the orders of Dr. Raoul Pitch pt is here for b12 injection, pt received injection in left deltoid at 09:20 am, given by [place] CMA\CPT pt tolerated injection well.

## 2021-08-13 DIAGNOSIS — Z23 Encounter for immunization: Secondary | ICD-10-CM | POA: Diagnosis not present

## 2021-08-13 DIAGNOSIS — R531 Weakness: Secondary | ICD-10-CM | POA: Diagnosis not present

## 2021-08-13 DIAGNOSIS — M5459 Other low back pain: Secondary | ICD-10-CM | POA: Diagnosis not present

## 2021-08-16 ENCOUNTER — Telehealth: Payer: Self-pay | Admitting: Family Medicine

## 2021-08-16 ENCOUNTER — Encounter: Payer: Self-pay | Admitting: Family Medicine

## 2021-08-16 NOTE — Telephone Encounter (Signed)
Patient Name: Mike Day Gender: Male DOB: November 30, 1945 Age: 75 Y 41 M 9 D Return Phone Number: [phone] (Primary) Address: City/ State/ Zip: [place]  [address] Client  [facility] - Client Client Site [place] - Day Provider Raoul Pitch, [place] Contact Type Call Who Is Calling Patient / Member / Family / Caregiver Call Type Triage / Clinical Relationship To Patient Self Return Phone Number [phone] (Primary) Chief Complaint TONGUE SWELLING - Sudden onset Reason for Call Symptomatic / Request for [place] states, pt had covid 19 booster and having symptoms- PT has facial swelling. Pt took many doses of Benadryl. Pt also has rash on chest and arm. Pt has swelling of tongue. Translation No Nurse Assessment Nurse: Hardin Negus, RN, Mardene Celeste Date/Time Eilene Ghazi Time): 08/16/2021 9:28:38 AM Confirm and document reason for call. If symptomatic, describe symptoms. ---He had covid 19 booster and having symptoms on Friday - PT has facial and tongue are swollen Saturday. Pt took many doses of Benadryl. Pt also has rash on chest and arm. His face is still swollen He is not having any issues swallowing or breathing. The injection site has the rash. T-99, 4 FH He took Benadryl last night Does the patient have any new or worsening symptoms? ---Yes Will a triage be completed? ---Yes Related visit to physician within the last 2 weeks? ---No Does the PT have any chronic conditions? (i.e. diabetes, asthma, this includes High risk factors for pregnancy, etc.) ---Unknown Is this a behavioral health or substance abuse call? ---No Guidelines Guideline Title Affirmed Question Affirmed Notes Nurse Date/Time Eilene Ghazi Time) Face Swelling Taking an ACE Inhibitor medication (e.g., benazepril/ LOTENSIN, captopril/CAPOTEN, enalapril/VASOTEC, lisinopril/ZESTRIL) Hardin Negus, RN, Mardene Celeste 08/16/2021  9:33:02 AM PLEASE NOTE: All timestamps contained within this report are represented as [place] Standard Time. CONFIDENTIALTY NOTICE: This fax transmission is intended only for the addressee. It contains information that is legally privileged, confidential or otherwise protected from use or disclosure. If you are not the intended recipient, you are strictly prohibited from reviewing, disclosing, copying using or disseminating any of this information or taking any action in reliance on or regarding this information. If you have received this fax in error, please notify us immediately by telephone so that we can arrange for its return to [place]. Phone: [phone], Toll-Free: [phone], Fax: [phone] Page: 2 of 2 Call Id: [id number] [place]. Time Eilene Ghazi Time) Disposition Final User 08/16/2021 9:27:32 AM Send to Urgent Queue Lonia Farber 08/16/2021 9:35:26 AM Go to ED Now (or PCP triage) Yes Hardin Negus, RN, [place] Disagree/Comply Disagree Caller Understands Yes PreDisposition Call Doctor Care Advice Given Per Guideline GO TO ED NOW (OR PCP TRIAGE): BRING MEDICINES: CARE ADVICE given per Face Swelling (Adult) guideline. Referrals GO TO FACILITY REFUSED

## 2021-08-16 NOTE — Telephone Encounter (Signed)
Spoke with pt and informed him to go to ED as triage nurse stated. Pt and wife voiced concerns for the ED with everything going around pt wants to know if he can be seen in office today.   Only appt avail is 4 slot. Pt was informed that he may still need to go to ED.

## 2021-08-16 NOTE — Telephone Encounter (Signed)
Please see mychart encounter

## 2021-08-16 NOTE — Telephone Encounter (Signed)
Pt was called and informed to come into office for appt. Pt declined appt and stated he feels 60% better. Pt was informed if sx come back he should go to the ER.

## 2021-08-16 NOTE — Telephone Encounter (Signed)
Pt called noting he had COVID booster & has had facial swelling Saturday and Sunday. It is not going away with multiple doses of Benadryl. He call pharmacy and hasn't gotten response from them. Pt noted eyes and lips are very swollen. Transferred to [place] with [facility].

## 2021-08-16 NOTE — Telephone Encounter (Signed)
Pt has upcoming appt on 12/15 for B12

## 2021-08-16 NOTE — Telephone Encounter (Signed)
Mike Day Gender: Male DOB: 01-Apr-1946 Age: 75 Y 71 M 5 D Return Phone Number: [phone] (Primary) Address: City/ State/ Zip: [place]  [address] Client [place]  [facility] - Client Client Site [place] - Day Provider Raoul Pitch, [place] Contact Type Call Who Is Calling Patient / Member / Family / Caregiver Call Type Triage / Clinical Relationship To Patient Self Return Phone Number [phone] (Primary) Chief Complaint Ear Fullness or Congestion Reason for Call Symptomatic / Request for [name] states that he has constant roaring in his right ear and his ear feels full. Translation No Nurse Assessment Nurse: Raenette Rover, RN, Zella Ball Date/Time (Eastern Time): 08/12/2021 10:01:37 AM Confirm and document reason for call. If symptomatic, describe symptoms. ---Caller states that he has constant roaring in his right ear and his ear feels full. Started 4-5 days ago. Denies any fever. Does the patient have any new or worsening symptoms? ---Yes Will a triage be completed? ---Yes Related visit to physician within the last 2 weeks? ---No Does the PT have any chronic conditions? (i.e. diabetes, asthma, this includes High risk factors for pregnancy, etc.) ---Yes List chronic conditions. ---htn Is this a behavioral health or substance abuse call? ---No Guidelines Guideline Title Affirmed Question Affirmed Notes Nurse Date/Time Eilene Ghazi Time) Ear - Congestion Ear congestion present > 48 hours Raenette Rover, RN, Zella Ball 08/12/2021 10:02:06 AM Disp. Time Eilene Ghazi Time) Disposition Final User 08/12/2021 10:04:37 AM SEE PCP WITHIN 3 DAYS Yes Raenette Rover, RN, Herbert Deaner Disagree/Comply Comply PLEASE NOTE: All timestamps contained within this report are represented as [place] Standard Time. CONFIDENTIALTY NOTICE: This fax transmission is intended only for the addressee. It contains information that is legally privileged,  confidential or otherwise protected from use or disclosure. If you are not the intended recipient, you are strictly prohibited from reviewing, disclosing, copying using or disseminating any of this information or taking any action in reliance on or regarding this information. If you have received this fax in error, please notify us immediately by telephone so that we can arrange for its return to [place]. Phone: [phone], Toll-Free: [phone], Fax: [phone] Page: 2 of 2 Call Id: [id number] [name] Understands Yes PreDisposition InappropriateToAsk Care Advice Given Per Guideline SEE PCP WITHIN 3 DAYS: * You need to be seen within 2 or 3 days. * Try chewing gum. * Phenylephrine Nasal Drops (e.g., Neo-Synephrine): Available OTC. Clean out the nose before using. Spray each nostril once, wait one minute for absorption, and then spray a second time.* Before taking any medicine, read all the instructions on the package. CALL BACK IF: * Severe ear pain occurs * You become worse CARE ADVICE given per Ear - Congestion (Adult) guideline. Referrals REFERRED TO PCP OFFIC

## 2021-08-17 DIAGNOSIS — R531 Weakness: Secondary | ICD-10-CM | POA: Diagnosis not present

## 2021-08-17 DIAGNOSIS — M5459 Other low back pain: Secondary | ICD-10-CM | POA: Diagnosis not present

## 2021-08-18 ENCOUNTER — Encounter: Payer: Self-pay | Admitting: Family Medicine

## 2021-08-18 NOTE — Telephone Encounter (Signed)
Please confirm pt do not need to be fasting.

## 2021-08-18 NOTE — Telephone Encounter (Signed)
I am assuming his appointment tomorrow is for follow-up on the weakness and the B12.  If that is the case we are doing blood work to recheck the B12 and he does not need to be fasting.

## 2021-08-19 ENCOUNTER — Other Ambulatory Visit: Payer: Self-pay

## 2021-08-19 ENCOUNTER — Ambulatory Visit (INDEPENDENT_AMBULATORY_CARE_PROVIDER_SITE_OTHER): Payer: Medicare Other | Admitting: Family Medicine

## 2021-08-19 ENCOUNTER — Encounter: Payer: Self-pay | Admitting: Family Medicine

## 2021-08-19 VITALS — BP 146/83 | HR 63 | Temp 97.3°F | Ht 75.0 in | Wt 182.0 lb

## 2021-08-19 DIAGNOSIS — E538 Deficiency of other specified B group vitamins: Secondary | ICD-10-CM

## 2021-08-19 DIAGNOSIS — E559 Vitamin D deficiency, unspecified: Secondary | ICD-10-CM | POA: Diagnosis not present

## 2021-08-19 LAB — VITAMIN B12: Vitamin B-12: 683 pg/mL (ref 211–911)

## 2021-08-19 LAB — VITAMIN D 25 HYDROXY (VIT D DEFICIENCY, FRACTURES): VITD: 24.99 ng/mL — ABNORMAL LOW (ref 30.00–100.00)

## 2021-08-19 NOTE — Patient Instructions (Signed)
°  We will call you with results once we receive them. I am glad you are feeling better.

## 2021-08-19 NOTE — Progress Notes (Signed)
This visit occurred during the SARS-CoV-2 public health emergency.  Safety protocols were in place, including screening questions prior to the visit, additional usage of staff PPE, and extensive cleaning of exam room while observing appropriate contact time as indicated for disinfecting solutions.    Mike Day , Sep 11, 1945, 75 y.o., male MRN: [id number] Patient Care Team    Relationship Specialty Notifications Start End  Ma Hillock, DO PCP - General Family Medicine  06/11/18   Sherren Mocha, MD PCP - Cardiology Cardiology  08/21/20   Rod Can, MD Consulting Physician Orthopedic Surgery  10/03/15   Sharmon Revere Physician Assistant Cardiology  04/29/16   Rolm Bookbinder, MD Consulting Physician Dermatology  08/24/17   Myrlene Broker, MD Attending Physician Urology  06/11/18   Warden Fillers, MD Consulting Physician Ophthalmology  06/11/18   Rosemarie Ax, MD Consulting Physician Sports Medicine  01/21/20     Chief Complaint  Patient presents with   b12 def     Subjective: Pt presents for an OV to follow up fatigue, vit d def and vit b12 Pt reports his fatigue and discomfort has helped a great deal. He also reports the PT has been a "God Send."  He has been supplementing orally both b12 (SL) and vit d for 8-9 weeks. He had his last b12 inj 08/11/2021.   Prior note: with complaints of severe fatigue and weight loss for the last 2-3 months.  He states he gets "flat or out ".  He states he has noticed he has been more constipated as of lately and has been using a stool softener.  He does snore, but states he feels rested in the morning.  He has never been told he is having apneic spells during sleep.  He denies any dizziness, heart palpitations, chest pain or shortness of breath.  Of note he actually has not lost much weight, except for 2-3 pounds.  He has a significant medical history of prostate cancer, but does have his PSA completed by urology yearly.   Is up-to-date with his colon cancer screenings.  He is prescribed Eliquis for his atrial fibrillation.  He denies any blood loss per rectum. Depression screen [facility] 2/9 06/22/2021 08/18/2020 02/26/2019 06/05/2018 08/24/2017  Decreased Interest 0 0 0 0 0  Down, Depressed, Hopeless 0 0 0 0 0  PHQ - 2 Score 0 0 0 0 0  Some recent data might be hidden    Allergies  Allergen Reactions   Levofloxacin Rash   Neosporin [Neomycin-Bacitracin Zn-Polymyx] Other (See Comments)    Dry/scaly skin/blistering    Tape Other (See Comments)    ADHESIVE TAPE; BLISTERS,   Social History   Social History Narrative   Married, 1 son, 2 grand-daughters.   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   Alcohol: 1 glass red wine per night.   Was once a long distance runner.   Past Medical History:  Diagnosis Date   Anemia    Early 20's   Atrial fibrillation ([place]) 10/07/2014   3-14 day cardiac monitor 03/2019:  AFib, Avg HR 69, no significant pauses, PVCs, wide complex runs (aberrancy vs NSVT - longest 32 beats).   Atrial flutter ([place]) 2008   Status post RFCA Dr Cristopher Peru 2008   Carotid artery plaque    Carotid US 8/21: Bilat ICA 1-39   Chronic atrial fibrillation (HCC)    Eliquis  Closed nondisplaced fracture of left patella 01/20/2020   Dermatochalasis of both upper eyelids 07/01/2014   Dysrhythmia    a fib    Echocardiograms    Echo 12/14 - Severe basal septal hypertrophy, moderate LVH, EF 60-65%, moderate LAE, mild RAE // Echo 7/20: EF 60-65, severe asymmetric LVH, mild RVE, normal RV SF, moderate BAE, moderate MAC, moderate aortic valve calcification (noncoronary cusp), mild dilation of ascending aorta (41 mm)   Hx of adenomatous colonic polyps    Hyperlipidemia    Hypertension    Malignant neoplasm of prostate ([place]) 05/18/2012   Neuromuscular disorder (HCC)    neuropathy feet   Patellar sleeve fracture of left knee 2021   Prostate cancer ([place]) 2006    Prostatectomy   Shingles 01/2019   left side of face/eye/neck/ear    SKIN CANCER, HX OF 03/11/2009   Moh's L hand for Squamous Cell;2 other squamous cells removed w/o Mohs 2013 Rolm Bookbinder MD  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   Past Surgical History:  Procedure Laterality Date   BLADDER SUSPENSION  2010   COLONOSCOPY W/ POLYPECTOMY  2003;2010;04/2014   Tubular adenoma: recall 5 yrs (after 04/2019)   EYE SURGERY Bilateral 2021,2022   HERNIA REPAIR  07/2008   Dr.Martin   INGUINAL HERNIA REPAIR Left 08/25/2020   Procedure: OPEN LEFT INGUINAL HERNIA REPAIR WITH MESH;  Surgeon: Johnathan Hausen, MD;  Location: [facility];  Service: General;  Laterality: Left;   MOHS SURGERY     left hand   MOHS SURGERY Left 12/2020   ear   PROSTATECTOMY  2006   Robotic for adenocarcinoma; Dr Lawerance Bach   RADIOFREQUENCY ABLATION  01/15/2007   for ectopic atrial foci   SEPTOPLASTY  Age 58   TRANSTHORACIC ECHOCARDIOGRAM  09/02/2013   Normal.  EF 60%.  LAE and RAE.   XI ROBOTIC ASSISTED INGUINAL HERNIA REPAIR WITH MESH Right 01/11/2021   Procedure: XI ROBOTIC ASSISTED  RIGHT INGUINAL HERNIA REPAIR WITH MESH;  Surgeon: Johnathan Hausen, MD;  Location: [facility];  Service: General;  Laterality: Right;   Family History  Problem Relation Age of Onset   Lung disease Father        Black Lung   Heart disease Father        Congenital   Emphysema Father    COPD Father    Diabetes Mother    Stroke Mother 71   Hypertension Brother    Heart disease Brother    Hyperlipidemia Brother    Heart attack Brother 14       sudden death   Cancer Neg Hx    Colon cancer Neg Hx    Esophageal cancer Neg Hx    Rectal cancer Neg Hx    Stomach cancer Neg Hx    Allergies as of 08/19/2021       Reactions   Levofloxacin Rash   Neosporin [neomycin-bacitracin Zn-polymyx] Other (See Comments)   Dry/scaly skin/blistering    Tape Other (See Comments)   ADHESIVE TAPE; BLISTERS,        Medication List         Accurate as of August 19, 2021 11:23 AM. If you have any questions, ask your nurse or doctor.          STOP taking these medications    fluorouracil 5 % cream Commonly known as: EFUDEX Stopped by: Howard Pouch, DO   predniSONE 20 MG tablet Commonly known as: DELTASONE Stopped by: Howard Pouch, DO  TAKE these medications    acetaminophen 500 MG tablet Commonly known as: TYLENOL Take 1,000 mg by mouth every 8 (eight) hours as needed for moderate pain.   amLODipine 2.5 MG tablet Commonly known as: NORVASC TAKE 1 TABLET TWICE A DAY What changed:  how much to take how to take this when to take this   ARTIFICIAL TEAR SOLUTION OP Place 1 drop into both eyes daily as needed (dry eyes).   cetaphil cream Apply 1 application topically daily.   cholecalciferol 25 MCG (1000 UNIT) tablet Commonly known as: VITAMIN D3 Take 1 tablet (1,000 Units total) by mouth daily.   Cyanocobalamin 1000 MCG Subl 1000 mg sublingual daily.   Eliquis 5 MG Tabs tablet Generic drug: apixaban TAKE 1 TABLET TWICE A DAY   lisinopril 20 MG tablet Commonly known as: ZESTRIL TAKE 1 TABLET DAILY (NOTE  DOSE CHANGE)   simvastatin 20 MG tablet Commonly known as: ZOCOR TAKE 1 TABLET AT BEDTIME   tizanidine 2 MG capsule Commonly known as: Zanaflex Take 1 capsule (2 mg total) by mouth 3 (three) times daily.   triamcinolone 55 MCG/ACT Aero nasal inhaler Commonly known as: NASACORT Place 1 spray into the nose daily as needed (allergies).        All past medical history, surgical history, allergies, family history, immunizations andmedications were updated in the EMR today and reviewed under the history and medication portions of their EMR.     ROS: Negative, with the exception of above mentioned in HPI   Objective:  BP (!) 146/83    Pulse 63    Temp (!) 97.3 F (36.3 C) (Oral)    Ht _0  (1.905 m)    Wt 182 lb (82.6 kg)    SpO2 97%    BMI 22.75 kg/m  Body mass index is 22.75  kg/m. Gen: Afebrile. No acute distress.  HENT: AT. [place].  Eyes:Pupils Equal Round Reactive to light, Extraocular movements intact,  Conjunctiva without redness, discharge or icterus. Chest: CTAB, no wheeze or crackles Neuro: Normal gait. PERLA. EOMi. Alert. Oriented x3 Psych: Normal affect, dress and demeanor. Normal speech. Normal thought content and judgment.   No results found. No results found. No results found for this or any previous visit (from the past 24 hour(s)).  Assessment/Plan: DAKING WESTERVELT is a 75 y.o. male present for OV for  Paresthesia/fatigue/muscle weakness/b12 def/vit d def: Work up 9 weeks ago resulted w/ b12 nad vit d def.  Continue oral b12 and vitd.  B12 and vit d levels collected today.  Will guide him on need to continue IM b12 or continue sublingual only, after results.  Continue PT- has been very helpful.    Reviewed expectations re: course of current medical issues. Discussed self-management of symptoms. Outlined signs and symptoms indicating need for more acute intervention. Patient verbalized understanding and all questions were answered. Patient received an After-Visit Summary.   Orders Placed This Encounter  Procedures   Vitamin B12   Vitamin D (25 hydroxy)    No orders of the defined types were placed in this encounter.  Referral Orders  No referral(s) requested today     Note is dictated utilizing voice recognition software. Although note has been proof read prior to signing, occasional typographical errors still can be missed. If any questions arise, please do not hesitate to call for verification.   electronically signed by:  Howard Pouch, DO  [place]

## 2021-08-20 ENCOUNTER — Other Ambulatory Visit: Payer: Self-pay | Admitting: Family Medicine

## 2021-08-20 DIAGNOSIS — R531 Weakness: Secondary | ICD-10-CM | POA: Diagnosis not present

## 2021-08-20 DIAGNOSIS — M5459 Other low back pain: Secondary | ICD-10-CM | POA: Diagnosis not present

## 2021-08-20 MED ORDER — VITAMIN D 25 MCG (1000 UNIT) PO TABS: 2000.0000 [IU] | ORAL_TABLET | Freq: Every day | ORAL | 3 refills | Status: AC

## 2021-08-24 ENCOUNTER — Encounter: Payer: Self-pay | Admitting: Family Medicine

## 2021-08-24 DIAGNOSIS — R531 Weakness: Secondary | ICD-10-CM | POA: Diagnosis not present

## 2021-08-24 DIAGNOSIS — M5459 Other low back pain: Secondary | ICD-10-CM | POA: Diagnosis not present

## 2021-08-27 DIAGNOSIS — M5459 Other low back pain: Secondary | ICD-10-CM | POA: Diagnosis not present

## 2021-08-27 DIAGNOSIS — R531 Weakness: Secondary | ICD-10-CM | POA: Diagnosis not present

## 2021-09-01 ENCOUNTER — Other Ambulatory Visit: Payer: Self-pay

## 2021-09-01 ENCOUNTER — Encounter: Payer: Self-pay | Admitting: Family Medicine

## 2021-09-01 ENCOUNTER — Ambulatory Visit (INDEPENDENT_AMBULATORY_CARE_PROVIDER_SITE_OTHER): Payer: Medicare Other | Admitting: Family Medicine

## 2021-09-01 ENCOUNTER — Ambulatory Visit (HOSPITAL_BASED_OUTPATIENT_CLINIC_OR_DEPARTMENT_OTHER)
Admission: RE | Admit: 2021-09-01 | Discharge: 2021-09-01 | Disposition: A | Discharge status: Home / Self Care | Payer: Medicare Other | Source: Ambulatory Visit | Attending: Family Medicine | Admitting: Family Medicine

## 2021-09-01 VITALS — BP 138/82 | HR 72 | Temp 98.4°F | Ht 75.0 in | Wt 182.0 lb

## 2021-09-01 DIAGNOSIS — M546 Pain in thoracic spine: Secondary | ICD-10-CM | POA: Insufficient documentation

## 2021-09-01 DIAGNOSIS — M545 Low back pain, unspecified: Secondary | ICD-10-CM | POA: Diagnosis not present

## 2021-09-01 DIAGNOSIS — M549 Dorsalgia, unspecified: Secondary | ICD-10-CM | POA: Diagnosis not present

## 2021-09-01 NOTE — Progress Notes (Signed)
This visit occurred during the SARS-CoV-2 public health emergency.  Safety protocols were in place, including screening questions prior to the visit, additional usage of staff PPE, and extensive cleaning of exam room while observing appropriate contact time as indicated for disinfecting solutions.    Mike Day , 1946/01/12, 75 y.o., male MRN: [id number] Patient Care Team    Relationship Specialty Notifications Start End  Ma Hillock, DO PCP - General Family Medicine  06/11/18   Sherren Mocha, MD PCP - Cardiology Cardiology  08/21/20   Rod Can, MD Consulting Physician Orthopedic Surgery  10/03/15   Sharmon Revere Physician Assistant Cardiology  04/29/16   Rolm Bookbinder, MD Consulting Physician Dermatology  08/24/17   Myrlene Broker, MD Attending Physician Urology  06/11/18   Warden Fillers, MD Consulting Physician Ophthalmology  06/11/18   Rosemarie Ax, MD Consulting Physician Sports Medicine  01/21/20     Chief Complaint  Patient presents with   Back Pain     Subjective: Pt presents for an OV to follow up back pain.  Patient has been suffering from lower lumbar discomfort for approximately 8 weeks.  He did sustain a fall at that time.  Lumbar x-rays were completed and patient has been performing physical therapy.  He states that his lower lumbar spine is perfectly fine now.  He believes all of the discomfort has resolved.  The new discomfort is in the right lower thoracic area.  He states when he was performing physical therapy and went to get up off the table he had a sharp pain in this location.  He has continued to have discomfort in this location since but not as sharp.  He is no longer using the muscle relaxer because it makes him sleepy.  He is still applying heat to the area.  Range of motion of thoracic spine is uncomfortable.  Prior note: Mike Day is a 75 y.o. male presents today to follow-up on his back pain.  He was seen 06/28/2021 after  he had a fall in a parking lot in which he fell on his back.  He initially reported some discomfort and stiffness along the bottom of his lumbar spine and had been tender over the SI joints during exam without any bony tenderness.  Patient was prescribed prednisone, Flexeril and encouraged to use heat application to help with muscle relaxation.  He has been taking Tylenol for discomfort.  He had called in reporting that his pain has not been improving much and was mostly near his lower back and sacrum area.  X-ray was ordered prior to appointment today which resulted with a L1 compression deformity of unknown age.  Patient reports he has no pain over this portion of his back.  He denies any radiation of pain down his legs or bladder or bowel dysfunction.   Prior note: Pt presents for an OV with complaints of lower back pain along his belt line after tripping in the parking lot 2 days ago.  He states he fell on a mix of grassy/gravel area.  He denies any wounds or bleeding.  He does not believe he had any bruising.  He is on a blood thinner.  He denies any bladder, bowel dysfunction or radiation of pain to buttocks or lower extremity.  He states the pain is all along his beltline lower back and feels more like a stiffness.  He tried Tylenol over the weekend which was not effective. Depression screen  [facility] 2/9 06/22/2021 08/18/2020 02/26/2019 06/05/2018 08/24/2017  Decreased Interest 0 0 0 0 0  Down, Depressed, Hopeless 0 0 0 0 0  PHQ - 2 Score 0 0 0 0 0  Some recent data might be hidden    Allergies  Allergen Reactions   Levofloxacin Rash   Neosporin [Neomycin-Bacitracin Zn-Polymyx] Other (See Comments)    Dry/scaly skin/blistering    Tape Other (See Comments)    ADHESIVE TAPE; BLISTERS,   Social History   Social History Narrative   Married, 1 son, 2 grand-daughters.   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   Alcohol: 1 glass red wine per night.    Was once a long distance runner.   Past Medical History:  Diagnosis Date   Anemia    Early 20's   Atrial fibrillation ([place]) 10/07/2014   3-14 day cardiac monitor 03/2019:  AFib, Avg HR 69, no significant pauses, PVCs, wide complex runs (aberrancy vs NSVT - longest 32 beats).   Atrial flutter ([place]) 2008   Status post RFCA Dr Cristopher Peru 2008   Carotid artery plaque    Carotid US 8/21: Bilat ICA 1-39   Chronic atrial fibrillation ([place])    Eliquis   Closed nondisplaced fracture of left patella 01/20/2020   Dermatochalasis of both upper eyelids 07/01/2014   Dysrhythmia    a fib    Echocardiograms    Echo 12/14 - Severe basal septal hypertrophy, moderate LVH, EF 60-65%, moderate LAE, mild RAE // Echo 7/20: EF 60-65, severe asymmetric LVH, mild RVE, normal RV SF, moderate BAE, moderate MAC, moderate aortic valve calcification (noncoronary cusp), mild dilation of ascending aorta (41 mm)   Hx of adenomatous colonic polyps    Hyperlipidemia    Hypertension    Malignant neoplasm of prostate ([place]) 05/18/2012   Neuromuscular disorder (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   Past Surgical History:  Procedure Laterality Date   BLADDER SUSPENSION  2010   COLONOSCOPY W/ POLYPECTOMY  2003;2010;04/2014   Tubular adenoma: recall 5 yrs (after 04/2019)   EYE SURGERY Bilateral 2021,2022   HERNIA REPAIR  07/2008   Dr.Martin   INGUINAL HERNIA REPAIR Left 08/25/2020   Procedure: OPEN LEFT INGUINAL HERNIA REPAIR WITH MESH;  Surgeon: Johnathan Hausen, MD;  Location: [facility];  Service: General;  Laterality: Left;   MOHS SURGERY     left hand   MOHS SURGERY Left 12/2020   ear   PROSTATECTOMY  2006   Robotic  for adenocarcinoma; Dr Lawerance Bach   RADIOFREQUENCY ABLATION  01/15/2007   for ectopic atrial foci   SEPTOPLASTY  Age 45   TRANSTHORACIC ECHOCARDIOGRAM  09/02/2013   Normal.  EF 60%.  LAE and RAE.   XI ROBOTIC ASSISTED INGUINAL HERNIA REPAIR WITH MESH Right 01/11/2021   Procedure: XI ROBOTIC ASSISTED  RIGHT INGUINAL HERNIA REPAIR WITH MESH;  Surgeon: Johnathan Hausen, MD;  Location: [facility];  Service: General;  Laterality: Right;   Family History  Problem Relation Age of Onset   Lung disease Father        Black  Lung   Heart disease Father        Congenital   Emphysema Father    COPD Father    Diabetes Mother    Stroke Mother 29   Hypertension Brother    Heart disease Brother    Hyperlipidemia Brother    Heart attack Brother 54       sudden death   Cancer Neg Hx    Colon cancer Neg Hx    Esophageal cancer Neg Hx    Rectal cancer Neg Hx    Stomach cancer Neg Hx    Allergies as of 09/01/2021       Reactions   Levofloxacin Rash   Neosporin [neomycin-bacitracin Zn-polymyx] Other (See Comments)   Dry/scaly skin/blistering    Tape Other (See Comments)   ADHESIVE TAPE; BLISTERS,        Medication List        Accurate as of September 01, 2021  2:54 PM. If you have any questions, ask your nurse or doctor.          acetaminophen 500 MG tablet Commonly known as: TYLENOL Take 1,000 mg by mouth every 8 (eight) hours as needed for moderate pain.   amLODipine 2.5 MG tablet Commonly known as: NORVASC TAKE 1 TABLET TWICE A DAY What changed:  how much to take how to take this when to take this   ARTIFICIAL TEAR SOLUTION OP Place 1 drop into both eyes daily as needed (dry eyes).   cetaphil cream Apply 1 application topically daily.   cholecalciferol 25 MCG (1000 UNIT) tablet Commonly known as: VITAMIN D3 Take 2 tablets (2,000 Units total) by mouth daily.   Cyanocobalamin 1000 MCG Subl 1000 mg sublingual daily.   Eliquis 5 MG Tabs tablet Generic drug:  apixaban TAKE 1 TABLET TWICE A DAY   lisinopril 20 MG tablet Commonly known as: ZESTRIL TAKE 1 TABLET DAILY (NOTE  DOSE CHANGE)   simvastatin 20 MG tablet Commonly known as: ZOCOR TAKE 1 TABLET AT BEDTIME   tizanidine 2 MG capsule Commonly known as: Zanaflex Take 1 capsule (2 mg total) by mouth 3 (three) times daily.   triamcinolone 55 MCG/ACT Aero nasal inhaler Commonly known as: NASACORT Place 1 spray into the nose daily as needed (allergies).        All past medical history, surgical history, allergies, family history, immunizations andmedications were updated in the EMR today and reviewed under the history and medication portions of their EMR.     ROS: Negative, with the exception of above mentioned in HPI   Objective:  BP 138/82    Pulse 72    Temp 98.4 F (36.9 C) (Oral)    Ht _0  (1.905 m)    Wt 182 lb (82.6 kg)    SpO2 99%    BMI 22.75 kg/m  Body mass index is 22.75 kg/m. Physical Exam Constitutional:      General: He is not in acute distress.    Appearance: Normal appearance.  HENT:     Head: Normocephalic and atraumatic.  Eyes:     Extraocular Movements: Extraocular movements intact.     Conjunctiva/sclera: Conjunctivae normal.     Pupils: Pupils are equal, round, and reactive to light.  Musculoskeletal:     Cervical back: Normal range of motion and neck supple.     Thoracic back: Swelling, spasms, tenderness and bony tenderness present. Decreased range of motion.     Lumbar back: Normal.       Back:  Skin:    General: Skin is warm and dry.     Findings: No bruising, erythema or rash.  Neurological:     Mental Status: He is alert and oriented to person, place, and time. Mental status is at baseline.     No results found. No results found. No results found for this or any previous visit (from the past 24 hour(s)).  Assessment/Plan: ALYAN HARTLINE is a 75 y.o. male present for OV for  Thoracic pain/Lumbar pain There is great improvement in  his lumbar back pain.  The current back pain is in a new location in the right lower thoracic region.  There are tissue texture changes, ropiness and discomfort to palpation over this area and over ribs shaft in this area.  Cannot rule out fracture of rib-thoracic x-ray ordered today with focus on right lower thoracic area. Encouraged him to continue heat application, muscle relaxers and stretches.  Would continue physical therapy as long as x-ray does not show stress fracture. Would consider referral to sports med for OMT and further eval if x-ray does not show acute cause of new discomfort.   Reviewed expectations re: course of current medical issues. Discussed self-management of symptoms. Outlined signs and symptoms indicating need for more acute intervention. Patient verbalized understanding and all questions were answered. Patient received an After-Visit Summary.   Orders Placed This Encounter  Procedures   DG Thoracic Spine W/Swimmers    No orders of the defined types were placed in this encounter.   Referral Orders  No referral(s) requested today     Note is dictated utilizing voice recognition software. Although note has been proof read prior to signing, occasional typographical errors still can be missed. If any questions arise, please do not hesitate to call for verification.   electronically signed by:  Howard Pouch, DO  [place]

## 2021-09-01 NOTE — Patient Instructions (Signed)
° °  Great to see you today.  I have refilled the medication(s) we provide.   If labs were collected, we will inform you of lab results once received either by echart message or telephone call.   - echart message- for normal results that have been seen by the patient already.   - telephone call: abnormal results or if patient has not viewed results in their echart.  Heat and muscle relaxer, stretches encourage if xray normal.

## 2021-09-03 ENCOUNTER — Telehealth: Payer: Self-pay | Admitting: Family Medicine

## 2021-09-03 ENCOUNTER — Encounter: Payer: Self-pay | Admitting: Family Medicine

## 2021-09-03 DIAGNOSIS — T07XXXA Unspecified multiple injuries, initial encounter: Secondary | ICD-10-CM

## 2021-09-03 DIAGNOSIS — S32010G Wedge compression fracture of first lumbar vertebra, subsequent encounter for fracture with delayed healing: Secondary | ICD-10-CM

## 2021-09-03 DIAGNOSIS — M4000 Postural kyphosis, site unspecified: Secondary | ICD-10-CM

## 2021-09-03 DIAGNOSIS — M545 Low back pain, unspecified: Secondary | ICD-10-CM

## 2021-09-03 DIAGNOSIS — S32018A Other fracture of first lumbar vertebra, initial encounter for closed fracture: Secondary | ICD-10-CM

## 2021-09-03 DIAGNOSIS — M546 Pain in thoracic spine: Secondary | ICD-10-CM

## 2021-09-03 HISTORY — DX: Wedge compression fracture of first lumbar vertebra, subsequent encounter for fracture with delayed healing: S32.010G

## 2021-09-03 HISTORY — DX: Postural kyphosis, site unspecified: M40.00

## 2021-09-03 NOTE — Telephone Encounter (Signed)
Please call patient:  His thoracic x-ray did not show evidence of any acute fracture or bony concern in the area of his new thoracic back pain.  It did again show the L1 compression fracture that the lumbar x-ray had shown.  I would encourage him to continue physical therapy if tolerable. Continue the muscle relaxer and heat application to help with thoracic pain. I have also referred him to one of our sports medicine specialist, that can perform osteopathic manipulation.  I believe he would benefit from manipulation in order to help him recovery from his back injury and may also help with his kyphosis (posture)    Lastly, I have ordered a bone density scan to be completed.  This is not urgent.  However I would recommend we evaluate his bone density in the next couple months to rule out osteoporosis, since he has had the compression fracture and he have the patellar fracture.  I ordered that at [facility].

## 2021-09-03 NOTE — Telephone Encounter (Signed)
FYI

## 2021-09-03 NOTE — Telephone Encounter (Signed)
Spoke with pt regarding results and instructions.   

## 2021-09-07 ENCOUNTER — Other Ambulatory Visit: Payer: Self-pay

## 2021-09-07 ENCOUNTER — Ambulatory Visit (HOSPITAL_BASED_OUTPATIENT_CLINIC_OR_DEPARTMENT_OTHER)
Admission: RE | Admit: 2021-09-07 | Discharge: 2021-09-07 | Disposition: A | Discharge status: Home / Self Care | Payer: Medicare Other | Source: Ambulatory Visit | Attending: Family Medicine | Admitting: Family Medicine

## 2021-09-07 DIAGNOSIS — T07XXXA Unspecified multiple injuries, initial encounter: Secondary | ICD-10-CM | POA: Diagnosis not present

## 2021-09-07 DIAGNOSIS — S32018A Other fracture of first lumbar vertebra, initial encounter for closed fracture: Secondary | ICD-10-CM | POA: Insufficient documentation

## 2021-09-07 DIAGNOSIS — M5459 Other low back pain: Secondary | ICD-10-CM | POA: Diagnosis not present

## 2021-09-07 DIAGNOSIS — R531 Weakness: Secondary | ICD-10-CM | POA: Diagnosis not present

## 2021-09-07 DIAGNOSIS — S32010G Wedge compression fracture of first lumbar vertebra, subsequent encounter for fracture with delayed healing: Secondary | ICD-10-CM | POA: Insufficient documentation

## 2021-09-07 DIAGNOSIS — M85832 Other specified disorders of bone density and structure, left forearm: Secondary | ICD-10-CM | POA: Diagnosis not present

## 2021-09-08 DIAGNOSIS — M5459 Other low back pain: Secondary | ICD-10-CM | POA: Diagnosis not present

## 2021-09-08 DIAGNOSIS — R531 Weakness: Secondary | ICD-10-CM | POA: Diagnosis not present

## 2021-09-13 DIAGNOSIS — R531 Weakness: Secondary | ICD-10-CM | POA: Diagnosis not present

## 2021-09-13 DIAGNOSIS — M5459 Other low back pain: Secondary | ICD-10-CM | POA: Diagnosis not present

## 2021-09-14 ENCOUNTER — Telehealth: Payer: Self-pay | Admitting: Family Medicine

## 2021-09-14 DIAGNOSIS — M8580 Other specified disorders of bone density and structure, unspecified site: Secondary | ICD-10-CM

## 2021-09-14 DIAGNOSIS — M810 Age-related osteoporosis without current pathological fracture: Secondary | ICD-10-CM | POA: Insufficient documentation

## 2021-09-14 MED ORDER — ALENDRONATE SODIUM 70 MG PO TABS: 70.0000 mg | ORAL_TABLET | ORAL | 11 refills | Status: DC

## 2021-09-14 NOTE — Telephone Encounter (Signed)
Spoke with pt regarding results and instructions.   

## 2021-09-14 NOTE — Telephone Encounter (Signed)
Please inform patient his bone density resulted with rather significant decrease in bone density with a score of -2.4.  This is in the range of osteopenia.  He is right on the cusp of being considered osteoporosis, which starts at -2.5.  I would recommend he start a medication called Fosamax.  This is a once weekly medication taken on the same day once a week, in the morning with a full glass of water, on an otherwise empty stomach.  -After taking the medication he would need to refrain from laying back down for at least 30 minutes.  I have called that medication into his pharmacy for him.  We will repeat his bone density in 2 years.   Continue the vitamin D supplementation as well.

## 2021-09-16 DIAGNOSIS — M5459 Other low back pain: Secondary | ICD-10-CM | POA: Diagnosis not present

## 2021-09-16 DIAGNOSIS — R531 Weakness: Secondary | ICD-10-CM | POA: Diagnosis not present

## 2021-09-20 DIAGNOSIS — M5459 Other low back pain: Secondary | ICD-10-CM | POA: Diagnosis not present

## 2021-09-20 DIAGNOSIS — R531 Weakness: Secondary | ICD-10-CM | POA: Diagnosis not present

## 2021-09-22 ENCOUNTER — Ambulatory Visit: Payer: Medicare Other

## 2021-09-27 DIAGNOSIS — R531 Weakness: Secondary | ICD-10-CM | POA: Diagnosis not present

## 2021-09-27 DIAGNOSIS — M5459 Other low back pain: Secondary | ICD-10-CM | POA: Diagnosis not present

## 2021-10-02 ENCOUNTER — Other Ambulatory Visit: Payer: Self-pay | Admitting: Cardiovascular Disease

## 2021-10-04 DIAGNOSIS — R531 Weakness: Secondary | ICD-10-CM | POA: Diagnosis not present

## 2021-10-04 DIAGNOSIS — M5459 Other low back pain: Secondary | ICD-10-CM | POA: Diagnosis not present

## 2021-10-08 ENCOUNTER — Encounter: Payer: Self-pay | Admitting: Family Medicine

## 2021-10-11 DIAGNOSIS — M5459 Other low back pain: Secondary | ICD-10-CM | POA: Diagnosis not present

## 2021-10-11 DIAGNOSIS — R531 Weakness: Secondary | ICD-10-CM | POA: Diagnosis not present

## 2021-10-12 ENCOUNTER — Telehealth: Payer: Self-pay

## 2021-10-12 NOTE — Telephone Encounter (Signed)
Completed and returned to cma work basket 

## 2021-10-12 NOTE — Telephone Encounter (Signed)
Received physical therapy orders. Placed orders on PCP desk for signature

## 2021-10-13 NOTE — Telephone Encounter (Signed)
faxed

## 2021-10-18 ENCOUNTER — Encounter: Payer: Self-pay | Admitting: Cardiovascular Disease

## 2021-10-25 ENCOUNTER — Other Ambulatory Visit: Payer: Self-pay | Admitting: Cardiovascular Disease

## 2021-11-01 DIAGNOSIS — H16221 Keratoconjunctivitis sicca, not specified as Sjogren's, right eye: Secondary | ICD-10-CM | POA: Diagnosis not present

## 2021-11-01 DIAGNOSIS — H40023 Open angle with borderline findings, high risk, bilateral: Secondary | ICD-10-CM | POA: Diagnosis not present

## 2021-11-01 DIAGNOSIS — H0011 Chalazion right upper eyelid: Secondary | ICD-10-CM | POA: Diagnosis not present

## 2021-11-13 DIAGNOSIS — Z20822 Contact with and (suspected) exposure to covid-19: Secondary | ICD-10-CM | POA: Diagnosis not present

## 2021-11-16 NOTE — Telephone Encounter (Signed)
Spoke with pt to schedule AWV in office. Patient declined to schedule wellness visit at this time.   

## 2021-12-09 DIAGNOSIS — I700 Atherosclerosis of aorta: Secondary | ICD-10-CM | POA: Insufficient documentation

## 2021-12-09 NOTE — Progress Notes (Signed)
?Cardiology Office Note:   ? ?Date:  12/10/2021  ? ?ID:  NICANOR MENDOLIA, DOB [birth year], MRN [id number] ? ?PCP:  Ma Hillock, DO  ?[facility] Providers ?Cardiologist:  Sherren Mocha, MD ?Cardiology APP:  Liliane Shi, PA-C    ?Referring MD: Ma Hillock, DO  ? ?Chief Complaint:  f/u for AFib ?  ? ?Patient Profile: ?AFlutter s/p RFCA ?Permanent AFib ?CHADS2-VASc=3 (DM, HTN, age x1). ?Apixaban Rx ?Diabetes mellitus ?Hypertension  ?Hyperlipidemia  ?Prostate CA s/p prostatectomy ?Echocardiogram 03/2019: EF 60-65 ?Event monitor 03/2019: AFib, Avg HR 69, PVCs, wide complex rhythm (NSVT vs aberrancy) ?Carotid Dz ?[place] 03/2019: Bilat ICA 1-39 ?[place] 8/21: bilat ICA 1-39 ?Aortic atherosclerosis (CT 12/2019) ?  ?Prior CV studies: ?Carotid US 04/13/20 ?Bilateral ICA 1-39 ?  ?Echocardiogram 04/02/2019 ?EF 60-65, severe asymmetric LVH (no LVOT gradient), normal RVSF, mild RVE, RVSP 24.6, mod BAE, mod MAC, ascending aortal mildly dilated (41 mm) ?  ?LT Monitor (3-14 days) 03/13/2019 ?AFib, avg HR 69, wide complex runs (NSVT vs aberrancy) - longest 32 beats ?  ?Carotid US 03/19/2019 ?Bilateral ICA 1-39 ?  ?ETT 10/16 ?No ST segment changes with exercise  ?  ?Echo 09/02/13  ?Severe basal septal hypertrophy, moderate LVH, EF 60-65%, moderate LAE, mild RAE  ? ? ?History of Present Illness:   ?Mike Day is a 76 y.o. male with the above problem list.  He was last seen in December 2021 prior to hernia repair.  He underwent left inguinal hernia repair in December 2021 and right inguinal hernia repair in May 2022. ? ?He returns for follow-up.  He is here alone.  He fell and suffered a vertebral fracture in December.  He went to physical therapy.  Over the past several months, he has noted some night sweats.  He did lose weight several months ago but has started to gain his weight back.  He has not had any fevers.  He has not had chest pain, shortness of breath, syncope, orthopnea, leg edema.  He and his wife started volunteering at the  [employer]. ?   ?Past Medical History:  ?Diagnosis Date  ? Anemia   ? Early 20's  ? Atrial fibrillation ([place]) 10/07/2014  ? 3-14 day cardiac monitor 03/2019:  AFib, Avg HR 69, no significant pauses, PVCs, wide complex runs (aberrancy vs NSVT - longest 32 beats).  ? Atrial flutter ([name]) 2008  ? Status post RFCA Dr Cristopher Peru 2008  ? Carotid artery plaque   ? Carotid US 8/21: Bilat ICA 1-39  ? Chronic atrial fibrillation ([facility])   ? Eliquis  ? Closed nondisplaced fracture of left patella 01/20/2020  ? Dermatochalasis of both upper eyelids 07/01/2014  ? Dysrhythmia   ? a fib   ? Echocardiograms   ? Echo 12/14 - Severe basal septal hypertrophy, moderate LVH, EF 60-65%, moderate LAE, mild RAE // Echo 7/20: EF 60-65, severe asymmetric LVH, mild RVE, normal RV SF, moderate BAE, moderate MAC, moderate aortic valve calcification (noncoronary cusp), mild dilation of ascending aorta (41 mm)  ? Hx of adenomatous colonic polyps   ? Hyperlipidemia   ? Hypertension   ? Malignant neoplasm of prostate ([name]) 05/18/2012  ? Neuromuscular disorder ([place])   ? neuropathy feet  ? Patellar sleeve fracture of left knee 2021  ? Prostate cancer [facility]) 2006  ? Prostatectomy  ? Shingles 01/2019  ? left side of face/eye/neck/ear   ? SKIN CANCER, HX OF 03/11/2009  ? 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 ?Current Medications: ?Current Meds  ?Medication Sig  ? acetaminophen (TYLENOL) 500 MG tablet Take 1,000 mg by mouth every 8 (eight) hours as needed for moderate pain.  ? alendronate (FOSAMAX) 70 MG tablet Take 1 tablet (70 mg total) by mouth every 7 (seven) days. Take with a full glass of water on an empty stomach.  ? amLODipine (NORVASC) 2.5 MG tablet Take 2.5 mg by mouth daily.  ? ARTIFICIAL TEAR SOLUTION OP Place 1 drop into both eyes daily as needed (dry eyes).  ? cholecalciferol (VITAMIN D3) 25 MCG (1000 UNIT) tablet Take 2 tablets (2,000  Units total) by mouth daily.  ? Cyanocobalamin 1000 MCG SUBL 1000 mg sublingual daily.  ? Emollient (CETAPHIL) cream Apply 1 application topically daily.  ? tizanidine (ZANAFLEX) 2 MG capsule Take 1 capsule (2 mg total) by mouth 3 (three) times daily.  ? triamcinolone (NASACORT) 55 MCG/ACT AERO nasal inhaler Place 1 spray into the nose daily as needed (allergies).  ? [DISCONTINUED] ELIQUIS 5 MG TABS tablet TAKE 1 TABLET TWICE A DAY  ? [DISCONTINUED] lisinopril (ZESTRIL) 20 MG tablet TAKE 1 TABLET DAILY  ? [DISCONTINUED] simvastatin (ZOCOR) 20 MG tablet TAKE 1 TABLET AT BEDTIME  ?  ?Allergies:   Levofloxacin, Neosporin [neomycin-bacitracin zn-polymyx], and Tape  ? ?Social History  ? ?Tobacco Use  ? Smoking status: Former  ?  Types: Cigarettes  ?  Quit date: 09/06/2007  ?  Years since quitting: 14.2  ? Smokeless tobacco: Never  ? Tobacco comments:  ?  smoked 1963 -2009 . Cigars < 1 / day on average 1992-2009  ?Vaping Use  ? Vaping Use: Never used  ?Substance Use Topics  ? Alcohol use: Yes  ?  Alcohol/week: 7.0 standard drinks  ?  Types: 7 Glasses of wine per week  ?  Comment: Socially  ? Drug use: No  ?  ?Family Hx: ?The patient's family history includes COPD in his father; Diabetes in his mother; Emphysema in his father; Heart attack (age of onset: 41) in his brother; Heart disease in his brother and father; Hyperlipidemia in his brother; Hypertension in his brother; Lung disease in his father; Stroke (age of onset: 49) in his mother. There is no history of Cancer, Colon cancer, Esophageal cancer, Rectal cancer, or Stomach cancer. ? ?Review of Systems  ?Gastrointestinal:  Negative for hematochezia and melena.  ?Genitourinary:  Negative for hematuria.   ? ?EKGs/Labs/Other Test Reviewed:   ? ?EKG:  EKG is  ordered today.  The ekg ordered today demonstrates atrial fibrillation, HR 55, normal axis, no ST-T wave changes, QTc 426 ? ?Recent Labs: ?06/22/2021: ALT 13; BUN 11; Creatinine, Ser 0.77; Hemoglobin 14.0; Platelets  199.0; Potassium 4.8; Sodium 135; TSH 0.98  ? ?Recent Lipid Panel ?No results for input(s): CHOL, TRIG, HDL, VLDL, LDLCALC, LDLDIRECT in the last 8760 hours.  ? ?Risk Assessment/Calculations:   ? ?CHA2DS2-VASc Score = 5  ? This indicates a 7.2% annual risk of stroke. ?The patient's score is based upon: ?CHF History: 0 ?HTN History: 1 ?Diabetes History: 1 ?Stroke History: 0 ?Vascular Disease History: 1 ?Age Score: 2 ?Gender Score: 0 ?  ? ?    ?Physical Exam:   ? ?VS:  BP (!) 156/84   Pulse (!) 55   Ht _0  (1.905 m)   Wt 181 lb 12.8 oz (82.5 kg)   SpO2 96%   BMI  22.72 kg/m?    ? ?Wt Readings from Last 3 Encounters:  ?12/10/21 181 lb 12.8 oz (82.5 kg)  ?09/01/21 182 lb (82.6 kg)  ?08/19/21 182 lb (82.6 kg)  ?  ?Constitutional:   ?   Appearance: Healthy appearance. Not in distress.  ?Neck:  ?   Thyroid: No thyromegaly.  ?   Vascular: No carotid bruit. JVD normal.  ?   Lymphadenopathy: No cervical adenopathy.  ?Pulmonary:  ?   Effort: Pulmonary effort is normal.  ?   Breath sounds: No wheezing. No rales.  ?Cardiovascular:  ?   Normal rate. Irregularly irregular rhythm. Normal S1. Normal S2.   ?   Murmurs: There is no murmur.  ?Edema: ?   Peripheral edema absent.  ?Abdominal:  ?   Palpations: Abdomen is soft. There is no hepatomegaly.  ?Skin: ?   General: Skin is warm and dry.  ?Neurological:  ?   General: No focal deficit present.  ?   Mental Status: Alert and oriented to person, place and time.  ?   Cranial Nerves: Cranial nerves are intact.  ?  ?    ?ASSESSMENT & PLAN:   ?Aortic atherosclerosis ([place]) ?He is not on aspirin as he is on Eliquis.  Continue simvastatin. ? ?Essential hypertension ?Blood pressure somewhat elevated today.  He has not had any medications yet.  I have asked him to track his blood pressure over the next couple of weeks and send me those readings for review.  For now, continue amlodipine 2.5 mg daily, lisinopril 20 mg daily.  Obtain follow-up CMET. ? ?Permanent atrial fibrillation  ([place]) ?Rate remains well controlled.  He is tolerating anticoagulation.  Age <80, weight >60 kg, creatinine <1.5.  Continue Eliquis 5 mg twice daily.  Obtain follow-up CMET, CBC today. ? ?Pure hypercholesterolemia ?Continu

## 2021-12-10 ENCOUNTER — Encounter: Payer: Self-pay | Admitting: Physician Assistant

## 2021-12-10 ENCOUNTER — Ambulatory Visit (INDEPENDENT_AMBULATORY_CARE_PROVIDER_SITE_OTHER): Payer: Medicare Other | Admitting: Physician Assistant

## 2021-12-10 VITALS — BP 156/84 | HR 55 | Ht 75.0 in | Wt 181.8 lb

## 2021-12-10 DIAGNOSIS — I700 Atherosclerosis of aorta: Secondary | ICD-10-CM | POA: Diagnosis not present

## 2021-12-10 DIAGNOSIS — I4821 Permanent atrial fibrillation: Secondary | ICD-10-CM | POA: Diagnosis not present

## 2021-12-10 DIAGNOSIS — E7800 Pure hypercholesterolemia, unspecified: Secondary | ICD-10-CM

## 2021-12-10 DIAGNOSIS — I10 Essential (primary) hypertension: Secondary | ICD-10-CM | POA: Diagnosis not present

## 2021-12-10 DIAGNOSIS — R61 Generalized hyperhidrosis: Secondary | ICD-10-CM | POA: Insufficient documentation

## 2021-12-10 HISTORY — DX: Generalized hyperhidrosis: R61

## 2021-12-10 LAB — LIPID PANEL
Chol/HDL Ratio: 2.3 ratio (ref 0.0–5.0)
Cholesterol, Total: 143 mg/dL (ref 100–199)
HDL: 61 mg/dL (ref >39)
LDL Chol Calc (NIH): 69 mg/dL (ref 0–99)
Triglycerides: 65 mg/dL (ref 0–149)
VLDL Cholesterol Cal: 13 mg/dL (ref 5–40)

## 2021-12-10 LAB — COMPREHENSIVE METABOLIC PANEL
ALT: 10 IU/L (ref 0–44)
AST: 17 IU/L (ref 0–40)
Albumin/Globulin Ratio: 1.9 (ref 1.2–2.2)
Albumin: 4.3 g/dL (ref 3.7–4.7)
Alkaline Phosphatase: 70 IU/L (ref 44–121)
BUN/Creatinine Ratio: 16 (ref 10–24)
BUN: 12 mg/dL (ref 8–27)
Bilirubin Total: 0.9 mg/dL (ref 0.0–1.2)
CO2: 25 mmol/L (ref 20–29)
Calcium: 9 mg/dL (ref 8.6–10.2)
Chloride: 97 mmol/L (ref 96–106)
Creatinine, Ser: 0.75 mg/dL — ABNORMAL LOW (ref 0.76–1.27)
Globulin, Total: 2.3 g/dL (ref 1.5–4.5)
Glucose: 117 mg/dL — ABNORMAL HIGH (ref 70–99)
Potassium: 4.3 mmol/L (ref 3.5–5.2)
Sodium: 136 mmol/L (ref 134–144)
Total Protein: 6.6 g/dL (ref 6.0–8.5)
eGFR: 94 mL/min/{1.73_m2} (ref >59)

## 2021-12-10 LAB — CBC
Hematocrit: 39.6 % (ref 37.5–51.0)
Hemoglobin: 13.9 g/dL (ref 13.0–17.7)
MCH: 33.4 pg — ABNORMAL HIGH (ref 26.6–33.0)
MCHC: 35.1 g/dL (ref 31.5–35.7)
MCV: 95 fL (ref 79–97)
Platelets: 181 10*3/uL (ref 150–450)
RBC: 4.16 x10E6/uL (ref 4.14–5.80)
RDW: 11.9 % (ref 11.6–15.4)
WBC: 5.8 10*3/uL (ref 3.4–10.8)

## 2021-12-10 MED ORDER — LISINOPRIL 20 MG PO TABS: 20.0000 mg | ORAL_TABLET | Freq: Every day | ORAL | 3 refills | Status: DC

## 2021-12-10 MED ORDER — SIMVASTATIN 20 MG PO TABS: 20.0000 mg | ORAL_TABLET | Freq: Every day | ORAL | 3 refills | Status: DC

## 2021-12-10 MED ORDER — APIXABAN 5 MG PO TABS: 5.0000 mg | ORAL_TABLET | Freq: Two times a day (BID) | ORAL | 3 refills | Status: DC

## 2021-12-10 NOTE — Assessment & Plan Note (Signed)
Etiology not entirely clear.  TSH in October was normal.  He is up-to-date on his colonoscopy.  He did have some weight loss but started gaining weight again.  I have asked him to follow-up with primary care for further evaluation.  Obtain CMET, CBC today. ?

## 2021-12-10 NOTE — Assessment & Plan Note (Signed)
He is not on aspirin as he is on Eliquis.  Continue simvastatin. ?

## 2021-12-10 NOTE — Assessment & Plan Note (Signed)
Blood pressure somewhat elevated today.  He has not had any medications yet.  I have asked him to track his blood pressure over the next couple of weeks and send me those readings for review.  For now, continue amlodipine 2.5 mg daily, lisinopril 20 mg daily.  Obtain follow-up CMET. ?

## 2021-12-10 NOTE — Assessment & Plan Note (Signed)
Continue simvastatin 20 mg daily.  Obtain follow-up CMET, lipids today. ?

## 2021-12-10 NOTE — Patient Instructions (Addendum)
Medication Instructions:  ?Your physician recommends that you continue on your current medications as directed. Please refer to the Current Medication list given to you today. ? ?*If you need a refill on your cardiac medications before your next appointment, please call your pharmacy* ? ? ?Lab Work: ?TODAY: CMET, LIPID, & CBC ? ?If you have labs (blood work) drawn today and your tests are completely normal, you will receive your results only by: ?MyChart Message (if you have MyChart) OR ?A paper copy in the mail ?If you have any lab test that is abnormal or we need to change your treatment, we will call you to review the results. ? ? ?Testing/Procedures: ?None ordered ? ? ?Follow-Up: ?At [facility], you and your health needs are our priority.  As part of our continuing mission to provide you with exceptional heart care, we have created designated Provider Care Teams.  These Care Teams include your primary Cardiologist (physician) and Advanced Practice Providers (APPs -  Physician Assistants and Nurse Practitioners) who all work together to provide you with the care you need, when you need it. ? ?We recommend signing up for the patient portal called "MyChart".  Sign up information is provided on this After Visit Summary.  MyChart is used to connect with patients for Virtual Visits (Telemedicine).  Patients are able to view lab/test results, encounter notes, upcoming appointments, etc.  Non-urgent messages can be sent to your provider as well.   ?To learn more about what you can do with MyChart, go to [email or web address].   ? ?Your next appointment:   ?12 month(s) ? ?The format for your next appointment:   ?In Person ? ?Provider:   ?[name], PA-C       ? ? ?Other Instructions ?our physician has requested that you regularly monitor and record your blood pressure readings at home. Please use the same machine at the same time of day to check your readings and record them to bring to your follow-up visit. ?   ?Please monitor blood pressures and keep a log of your readings and send in some readings in 2 weeks either via Mychart or you can call us.   ?  ?Make sure to check 2 hours after your medications.  ?  ?AVOID these things for 30 minutes before checking your blood pressure: ?No Drinking caffeine. ?No Drinking alcohol. ?No Eating. ?No Smoking. ?No Exercising. ?  ?Five minutes before checking your blood pressure: ?Pee. ?Sit in a dining chair. Avoid sitting in a soft couch or armchair. ?Be quiet. Do not talk  ?

## 2021-12-10 NOTE — Assessment & Plan Note (Signed)
Rate remains well controlled.  He is tolerating anticoagulation.  Age <80, weight >60 kg, creatinine <1.5.  Continue Eliquis 5 mg twice daily.  Obtain follow-up CMET, CBC today. ?

## 2021-12-13 NOTE — Progress Notes (Signed)
Pt has been made aware of normal result and verbalized understanding.  jw

## 2021-12-15 DIAGNOSIS — Z20822 Contact with and (suspected) exposure to covid-19: Secondary | ICD-10-CM | POA: Diagnosis not present

## 2021-12-17 ENCOUNTER — Other Ambulatory Visit: Payer: Self-pay | Admitting: Physician Assistant

## 2021-12-27 DIAGNOSIS — L814 Other melanin hyperpigmentation: Secondary | ICD-10-CM | POA: Diagnosis not present

## 2021-12-27 DIAGNOSIS — D1801 Hemangioma of skin and subcutaneous tissue: Secondary | ICD-10-CM | POA: Diagnosis not present

## 2021-12-27 DIAGNOSIS — L570 Actinic keratosis: Secondary | ICD-10-CM | POA: Diagnosis not present

## 2021-12-27 DIAGNOSIS — C44712 Basal cell carcinoma of skin of right lower limb, including hip: Secondary | ICD-10-CM | POA: Diagnosis not present

## 2021-12-27 DIAGNOSIS — C44719 Basal cell carcinoma of skin of left lower limb, including hip: Secondary | ICD-10-CM | POA: Diagnosis not present

## 2021-12-27 DIAGNOSIS — L821 Other seborrheic keratosis: Secondary | ICD-10-CM | POA: Diagnosis not present

## 2021-12-27 DIAGNOSIS — Z85828 Personal history of other malignant neoplasm of skin: Secondary | ICD-10-CM | POA: Diagnosis not present

## 2022-01-04 DIAGNOSIS — Z20822 Contact with and (suspected) exposure to covid-19: Secondary | ICD-10-CM | POA: Diagnosis not present

## 2022-01-17 ENCOUNTER — Telehealth: Payer: Self-pay

## 2022-01-17 NOTE — Telephone Encounter (Signed)
Completed physical therapy completed.  What is next step?  Patient requesting referral to neurology. ? ?Patient can be reached at [phone]. ?

## 2022-01-17 NOTE — Telephone Encounter (Signed)
Spoke with patient regarding results/recommendations.  

## 2022-01-20 ENCOUNTER — Ambulatory Visit (INDEPENDENT_AMBULATORY_CARE_PROVIDER_SITE_OTHER): Payer: Medicare Other | Admitting: Family Medicine

## 2022-01-20 ENCOUNTER — Encounter: Payer: Self-pay | Admitting: Family Medicine

## 2022-01-20 VITALS — BP 129/58 | HR 69 | Temp 98.2°F | Ht 75.0 in | Wt 178.0 lb

## 2022-01-20 DIAGNOSIS — S39012S Strain of muscle, fascia and tendon of lower back, sequela: Secondary | ICD-10-CM

## 2022-01-20 NOTE — Progress Notes (Signed)
Mike Day , 15-Oct-1945, 76 y.o., male MRN: [id number] Patient Care Team    Relationship Specialty Notifications Start End  Ma Hillock, DO PCP - General Family Medicine  06/11/18   Sherren Mocha, MD PCP - Cardiology Cardiology  08/21/20   Rod Can, MD Consulting Physician Orthopedic Surgery  10/03/15   Sharmon Revere Physician Assistant Cardiology  04/29/16   Rolm Bookbinder, MD Consulting Physician Dermatology  08/24/17   Myrlene Broker, MD Attending Physician Urology  06/11/18   Warden Fillers, MD Consulting Physician Ophthalmology  06/11/18   Rosemarie Ax, MD Consulting Physician Sports Medicine  01/21/20     Chief Complaint  Patient presents with   Back Pain    Pt presented with chronic low back pain x 6 mo; has had some improvement with PT and has completed 3 mos ago; Pt has seen some worsening in the last 3 weeks;      Subjective: Pt presents for an OV to follow up back pain.  He presented approximately 6 months ago after he had a fall.  At that time he was found to have a L1 compression fracture.  He has never had pain at this level.  His pain has been more right lower lumbar.  He has now gone through physical therapy and received great benefit.  However he has noticed over the past couple weeks his back started bothering him in the right lower lumbar area.  He states if he keeps moving around he feels okay but as soon as he stops moving even for short periods of time it becomes tight and uncomfortable again.  Prior note: Patient has been suffering from lower lumbar discomfort for approximately 8 weeks.  He did sustain a fall at that time.  Lumbar x-rays were completed and patient has been performing physical therapy.  He states that his lower lumbar spine is perfectly fine now.  He believes all of the discomfort has resolved.  The new discomfort is in the right lower thoracic area.  He states when he was performing physical therapy and went to get  up off the table he had a sharp pain in this location.  He has continued to have discomfort in this location since but not as sharp.  He is no longer using the muscle relaxer because it makes him sleepy.  He is still applying heat to the area.  Range of motion of thoracic spine is uncomfortable.  Prior note: Mike Day is a 76 y.o. male presents today to follow-up on his back pain.  He was seen 06/28/2021 after he had a fall in a parking lot in which he fell on his back.  He initially reported some discomfort and stiffness along the bottom of his lumbar spine and had been tender over the SI joints during exam without any bony tenderness.  Patient was prescribed prednisone, Flexeril and encouraged to use heat application to help with muscle relaxation.  He has been taking Tylenol for discomfort.  He had called in reporting that his pain has not been improving much and was mostly near his lower back and sacrum area.  X-ray was ordered prior to appointment today which resulted with a L1 compression deformity of unknown age.  Patient reports he has no pain over this portion of his back.  He denies any radiation of pain down his legs or bladder or bowel dysfunction.   Prior note: Pt presents for an OV  with complaints of lower back pain along his belt line after tripping in the parking lot 2 days ago.  He states he fell on a mix of grassy/gravel area.  He denies any wounds or bleeding.  He does not believe he had any bruising.  He is on a blood thinner.  He denies any bladder, bowel dysfunction or radiation of pain to buttocks or lower extremity.  He states the pain is all along his beltline lower back and feels more like a stiffness.  He tried Tylenol over the weekend which was not effective.    06/22/2021   11:03 AM 08/18/2020    9:26 AM 02/26/2019   12:13 PM 06/05/2018    8:55 AM 08/24/2017   10:22 AM  Depression screen PHQ 2/9  Decreased Interest 0 0 0 0 0  Down, Depressed, Hopeless 0 0 0 0 0  PHQ -  2 Score 0 0 0 0 0    Allergies  Allergen Reactions   Levofloxacin Rash   Neosporin [Neomycin-Bacitracin Zn-Polymyx] Other (See Comments)    Dry/scaly skin/blistering    Tape Other (See Comments)    ADHESIVE TAPE; BLISTERS,   Social History   Social History Narrative   Married, 1 son, 2 grand-daughters.   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   Alcohol: 1 glass red wine per night.   Was once a long distance runner.   Past Medical History:  Diagnosis Date   Anemia    Early 20's   Atrial fibrillation ([place]) 10/07/2014   3-14 day cardiac monitor 03/2019:  AFib, Avg HR 69, no significant pauses, PVCs, wide complex runs (aberrancy vs NSVT - longest 32 beats).   Atrial flutter ([place]) 2008   Status post RFCA Dr Cristopher Peru 2008   Carotid artery plaque    Carotid US 8/21: Bilat ICA 1-39   Chronic atrial fibrillation ([place])    Eliquis   Closed nondisplaced fracture of left patella 01/20/2020   Dermatochalasis of both upper eyelids 07/01/2014   Dysrhythmia    a fib    Echocardiograms    Echo 12/14 - Severe basal septal hypertrophy, moderate LVH, EF 60-65%, moderate LAE, mild RAE // Echo 7/20: EF 60-65, severe asymmetric LVH, mild RVE, normal RV SF, moderate BAE, moderate MAC, moderate aortic valve calcification (noncoronary cusp), mild dilation of ascending aorta (41 mm)   Hx of adenomatous colonic polyps    Hyperlipidemia    Hypertension    Malignant neoplasm of prostate ([place]) 05/18/2012   Neuromuscular disorder ([place])    neuropathy feet   Patellar sleeve fracture of left knee 2021   Prostate cancer ([place]) 2006   Prostatectomy   Shingles 01/2019   left side of face/eye/neck/ear    SKIN CANCER, HX OF 03/11/2009   Moh's L hand for Squamous Cell;2 other squamous cells removed w/o Mohs 2013 Rolm Bookbinder MD  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   Past Surgical History:  Procedure Laterality Date   BLADDER SUSPENSION  2010    COLONOSCOPY W/ POLYPECTOMY  2003;2010;04/2014   Tubular adenoma: recall 5 yrs (after 04/2019)   EYE SURGERY Bilateral 2021,2022   HERNIA REPAIR  07/2008   Dr.Martin   INGUINAL HERNIA REPAIR Left 08/25/2020   Procedure: OPEN LEFT INGUINAL HERNIA REPAIR WITH MESH;  Surgeon: Johnathan Hausen, MD;  Location: [facility];  Service: General;  Laterality: Left;   MOHS SURGERY     left hand  MOHS SURGERY Left 12/2020   ear   PROSTATECTOMY  2006   Robotic for adenocarcinoma; Dr Lawerance Bach   RADIOFREQUENCY ABLATION  01/15/2007   for ectopic atrial foci   SEPTOPLASTY  Age 57   TRANSTHORACIC ECHOCARDIOGRAM  09/02/2013   Normal.  EF 60%.  LAE and RAE.   XI ROBOTIC ASSISTED INGUINAL HERNIA REPAIR WITH MESH Right 01/11/2021   Procedure: XI ROBOTIC ASSISTED  RIGHT INGUINAL HERNIA REPAIR WITH MESH;  Surgeon: Johnathan Hausen, MD;  Location: [facility];  Service: General;  Laterality: Right;   Family History  Problem Relation Age of Onset   Lung disease Father        Black Lung   Heart disease Father        Congenital   Emphysema Father    COPD Father    Diabetes Mother    Stroke Mother 14   Hypertension Brother    Heart disease Brother    Hyperlipidemia Brother    Heart attack Brother 55       sudden death   Cancer Neg Hx    Colon cancer Neg Hx    Esophageal cancer Neg Hx    Rectal cancer Neg Hx    Stomach cancer Neg Hx    Allergies as of 01/20/2022       Reactions   Levofloxacin Rash   Neosporin [neomycin-bacitracin Zn-polymyx] Other (See Comments)   Dry/scaly skin/blistering    Tape Other (See Comments)   ADHESIVE TAPE; BLISTERS,        Medication List        Accurate as of Jan 20, 2022  3:00 PM. If you have any questions, ask your nurse or doctor.          STOP taking these medications    tizanidine 2 MG capsule Commonly known as: Zanaflex Stopped by: Howard Pouch, DO       TAKE these medications    acetaminophen 500 MG tablet Commonly known as: TYLENOL Take 1,000 mg  by mouth every 8 (eight) hours as needed for moderate pain.   alendronate 70 MG tablet Commonly known as: FOSAMAX Take 1 tablet (70 mg total) by mouth every 7 (seven) days. Take with a full glass of water on an empty stomach.   amLODipine 2.5 MG tablet Commonly known as: NORVASC Take 1 tablet (2.5 mg total) by mouth daily.   apixaban 5 MG Tabs tablet Commonly known as: Eliquis Take 1 tablet (5 mg total) by mouth 2 (two) times daily.   ARTIFICIAL TEAR SOLUTION OP Place 1 drop into both eyes daily as needed (dry eyes).   cetaphil cream Apply 1 application topically daily.   cholecalciferol 25 MCG (1000 UNIT) tablet Commonly known as: VITAMIN D3 Take 2 tablets (2,000 Units total) by mouth daily.   Cyanocobalamin 1000 MCG Subl 1000 mg sublingual daily.   lisinopril 20 MG tablet Commonly known as: ZESTRIL Take 1 tablet (20 mg total) by mouth daily.   simvastatin 20 MG tablet Commonly known as: ZOCOR Take 1 tablet (20 mg total) by mouth at bedtime.   triamcinolone 55 MCG/ACT Aero nasal inhaler Commonly known as: NASACORT Place 1 spray into the nose daily as needed (allergies).        All past medical history, surgical history, allergies, family history, immunizations andmedications were updated in the EMR today and reviewed under the history and medication portions of their EMR.     ROS: Negative, with the exception of above mentioned in HPI  Objective:  BP (!) 129/58   Pulse 69   Temp 98.2 F (36.8 C) (Oral)   Ht _0  (1.905 m)   Wt 178 lb (80.7 kg)   SpO2 99%   BMI 22.25 kg/m  Body mass index is 22.25 kg/m. Physical Exam Constitutional:      General: He is not in acute distress.    Appearance: Normal appearance.  HENT:     Head: Normocephalic and atraumatic.  Eyes:     Extraocular Movements: Extraocular movements intact.     Conjunctiva/sclera: Conjunctivae normal.     Pupils: Pupils are equal, round, and reactive to light.  Musculoskeletal:      Cervical back: Normal range of motion and neck supple.     Thoracic back: Normal. No swelling, spasms, tenderness or bony tenderness. Normal range of motion.     Lumbar back: Tenderness present. Decreased range of motion. Negative right straight leg raise test and negative left straight leg raise test.       Back:  Skin:    General: Skin is warm and dry.     Findings: No bruising, erythema or rash.  Neurological:     Mental Status: He is alert and oriented to person, place, and time. Mental status is at baseline.     No results found. No results found. No results found for this or any previous visit (from the past 24 hour(s)).  Assessment/Plan: Mike Day is a 76 y.o. male present for OV for  Lumbar pain/strain Since original fall about 6 months ago he has had great improvement.  He completed physical therapy and was able to get back to his baseline.  However over the last couple weeks he has noticed increased low back pain over his right lower lumbar-iliolumbar ligament area.   We discussed OMT evaluate and treat today and he is agreeable to referral to sports med.  He had been referred once in January but declined when called because he was feeling much improved. We discussed the iliolumbar ligaments, SI joints, lumbar/pelvic somatic dysfunction today. Referral to sports med placed   Reviewed expectations re: course of current medical issues. Discussed self-management of symptoms. Outlined signs and symptoms indicating need for more acute intervention. Patient verbalized understanding and all questions were answered. Patient received an After-Visit Summary.   Orders Placed This Encounter  Procedures   Ambulatory referral to Sports Medicine    No orders of the defined types were placed in this encounter.   Referral Orders         Ambulatory referral to Sports Medicine     29 minutes spent providing patient counseling and care.    Note is dictated utilizing voice  recognition software. Although note has been proof read prior to signing, occasional typographical errors still can be missed. If any questions arise, please do not hesitate to call for verification.   electronically signed by:  Howard Pouch, DO  [place]

## 2022-01-20 NOTE — Patient Instructions (Signed)
I believe this is the ilolumbar ligament that is giving you continued pain.   I will place a referral to our doctor of osteopathic medicine, they are sports med specialist

## 2022-01-26 NOTE — Progress Notes (Signed)
Benito Mccreedy D.[place] Phone: [phone]   Assessment and Plan:     1. Compression fracture of L1 vertebra, initial encounter ([place]) 2. Chronic right-sided low back pain without sciatica 3. DDD (degenerative disc disease), lumbar 4. Somatic dysfunction of lumbar region 5. Somatic dysfunction of pelvic region 6. Somatic dysfunction of sacral region -Chronic with exacerbation, initial sports medicine visit - Low back pain x5 to 6 months with pain most prominent at right side L5, right SI - Patient has L1 compression fracture with 70% height loss seen on x-ray in 2022, and not seen on imaging on 2021, however this does not coincide with patient's area of pain.  It is possible that this fracture is led to increased stress in lower lumbar spine and compensation that is causing patient's pain - Patient elected to trial first OMT today.  Patient had mild to moderate discomfort rotating lumbar spine on table.  This improved with positioning.  HVLA not performed - Decision today to treat with OMT was based on Physical Exam  After verbal consent patient was treated with, ME (muscle energy), FPR (flex positional release), ST (soft tissue), PC/PD (Pelvic Compression/ Pelvic Decompression) techniques in cervical, rib, thoracic, lumbar, and pelvic areas. Patient tolerated the procedure well with improvement in symptoms.  Patient educated on potential side effects of soreness and recommended to rest, hydrate, and use Tylenol as needed for pain control.   Pertinent previous records reviewed include PCP note 01/20/2022, thoracic spine x-ray 09/01/2021, lumbar spine x-ray 07/02/2021   Follow Up: 4 weeks for reevaluation.  Could repeat OMT if patient found that it was beneficial   Subjective:   I, Moenique Parris, am serving as a Education administrator for Doctor Glennon Mac  Chief Complaint: low back and SI pain   HPI:    01/28/2022 Patient is a 76 year old male complaining of low back and SI pain. Patient states approximately 6 months ago after he had a fall.  At that time he was found to have a L1 compression fracture.  He has never had pain at this level.  His pain has been more right lower lumbar.  He has now gone through physical therapy and received great benefit.  However he has noticed over the past couple weeks his back started bothering him in the right lower lumbar area.  He states if he keeps moving around he feels okay but as soon as he stops moving even for short periods of time it becomes tight and uncomfortable again. No numbness or tingling, has been taking one or two tylenol for the pain it helps, also taking a muscle relaxer if its really bad can only sleep on his left side, pain radiates more to the right than it does to the left . Wants to get back to the golf course if he can that's his goal   Relevant Historical Information: Chronic atrial fibrillation on anticoagulation with Eliquis, hypertension, DM type II, history of prostate cancer  Additional pertinent review of systems negative.   Current Outpatient Medications:    acetaminophen (TYLENOL) 500 MG tablet, Take 1,000 mg by mouth every 8 (eight) hours as needed for moderate pain., Disp: , Rfl:    alendronate (FOSAMAX) 70 MG tablet, Take 1 tablet (70 mg total) by mouth every 7 (seven) days. Take with a full glass of water on an empty stomach., Disp: 4 tablet, Rfl: 11   amLODipine (NORVASC) 2.5 MG tablet, Take  1 tablet (2.5 mg total) by mouth daily., Disp: 90 tablet, Rfl: 3   apixaban (ELIQUIS) 5 MG TABS tablet, Take 1 tablet (5 mg total) by mouth 2 (two) times daily., Disp: 180 tablet, Rfl: 3   ARTIFICIAL TEAR SOLUTION OP, Place 1 drop into both eyes daily as needed (dry eyes)., Disp: , Rfl:    cholecalciferol (VITAMIN D3) 25 MCG (1000 UNIT) tablet, Take 2 tablets (2,000 Units total) by mouth daily., Disp: 180 tablet, Rfl: 3   Cyanocobalamin  1000 MCG SUBL, 1000 mg sublingual daily., Disp: 90 tablet, Rfl: 3   Emollient (CETAPHIL) cream, Apply 1 application topically daily., Disp: , Rfl:    lisinopril (ZESTRIL) 20 MG tablet, Take 1 tablet (20 mg total) by mouth daily., Disp: 90 tablet, Rfl: 3   simvastatin (ZOCOR) 20 MG tablet, Take 1 tablet (20 mg total) by mouth at bedtime., Disp: 90 tablet, Rfl: 3   triamcinolone (NASACORT) 55 MCG/ACT AERO nasal inhaler, Place 1 spray into the nose daily as needed (allergies)., Disp: , Rfl:    Objective:     Vitals:   01/28/22 1421  BP: 122/76  Pulse: 82  SpO2: 96%  Weight: 181 lb (82.1 kg)  Height: '6\' 3"'$  (1.905 m)      Body mass index is 22.62 kg/m.    Physical Exam:    General: Well-appearing, cooperative, sitting comfortably in no acute distress.   OMT Physical Exam:  ASIS Compression Test: Positive Right Sacrum: NTTP bilateral sacral base.  Negative sphinx Lumbar: TTP paraspinal, loss of lordosis.  Reduced rotation bilaterally Pelvis: Right anterior innominate    Electronically signed by:  Benito Mccreedy D.[facility] 3:26 PM 01/28/22

## 2022-01-28 ENCOUNTER — Ambulatory Visit (INDEPENDENT_AMBULATORY_CARE_PROVIDER_SITE_OTHER): Payer: Medicare Other | Admitting: Sports Medicine

## 2022-01-28 VITALS — BP 122/76 | HR 82 | Ht 75.0 in | Wt 181.0 lb

## 2022-01-28 DIAGNOSIS — M9905 Segmental and somatic dysfunction of pelvic region: Secondary | ICD-10-CM | POA: Diagnosis not present

## 2022-01-28 DIAGNOSIS — M9904 Segmental and somatic dysfunction of sacral region: Secondary | ICD-10-CM | POA: Diagnosis not present

## 2022-01-28 DIAGNOSIS — M9903 Segmental and somatic dysfunction of lumbar region: Secondary | ICD-10-CM | POA: Diagnosis not present

## 2022-01-28 DIAGNOSIS — G8929 Other chronic pain: Secondary | ICD-10-CM

## 2022-01-28 DIAGNOSIS — M5136 Other intervertebral disc degeneration, lumbar region: Secondary | ICD-10-CM

## 2022-01-28 DIAGNOSIS — S32010A Wedge compression fracture of first lumbar vertebra, initial encounter for closed fracture: Secondary | ICD-10-CM | POA: Diagnosis not present

## 2022-01-28 DIAGNOSIS — M545 Low back pain, unspecified: Secondary | ICD-10-CM

## 2022-01-28 NOTE — Patient Instructions (Addendum)
Good to see you  °4 week follow up for repeat OMT °

## 2022-01-29 ENCOUNTER — Ambulatory Visit (INDEPENDENT_AMBULATORY_CARE_PROVIDER_SITE_OTHER): Payer: Medicare Other

## 2022-01-29 DIAGNOSIS — Z0000 Encounter for general adult medical examination without abnormal findings: Secondary | ICD-10-CM

## 2022-01-29 MED ORDER — SHINGRIX 50 MCG/0.5ML IM SUSR: 0.5000 mL | Freq: Once | INTRAMUSCULAR | 1 refills | Status: AC

## 2022-01-29 NOTE — Progress Notes (Signed)
Subjective:   Mike Day is a 76 y.o. male who presents for Medicare Annual/Subsequent preventive examination.  Review of Systems    Defer to PCP Cardiac Risk Factors include: advanced age (>69mn, >>26women);diabetes mellitus;male gender;hypertension     Objective:    Today's Vitals   01/29/22 0944  PainSc: 3    There is no height or weight on file to calculate BMI.     01/29/2022    9:47 AM 01/11/2021    5:49 AM 01/01/2021    1:17 PM 08/20/2020    1:29 PM 05/22/2018    9:37 AM 05/16/2018    5:39 PM 08/24/2017   10:21 AM  Advanced Directives  Does Patient Have a Medical Advance Directive? _0  No Yes  Type of AParamedicof AMondaminLiving will HAbramsLiving will HOjaiLiving will HClevelandLiving will HUnityLiving will  HSan AntonioLiving will  Does patient want to make changes to medical advance directive? No - Patient declined No - Patient declined  No - Patient declined No - Patient declined    Copy of HRidgwayin Chart? Yes - validated most recent copy scanned in chart (See row information) No - copy requested  Yes - validated most recent copy scanned in chart (See row information)   No - copy requested  Would patient like information on creating a medical advance directive?      No - Patient declined     Current Medications (verified) Outpatient Encounter Medications as of 01/29/2022  Medication Sig   acetaminophen (TYLENOL) 500 MG tablet Take 1,000 mg by mouth every 8 (eight) hours as needed for moderate pain.   alendronate (FOSAMAX) 70 MG tablet Take 1 tablet (70 mg total) by mouth every 7 (seven) days. Take with a full glass of water on an empty stomach.   amLODipine (NORVASC) 2.5 MG tablet Take 1 tablet (2.5 mg total) by mouth daily.   apixaban (ELIQUIS) 5 MG TABS tablet Take 1 tablet (5 mg total) by mouth  2 (two) times daily.   ARTIFICIAL TEAR SOLUTION OP Place 1 drop into both eyes daily as needed (dry eyes).   cholecalciferol (VITAMIN D3) 25 MCG (1000 UNIT) tablet Take 2 tablets (2,000 Units total) by mouth daily.   Cyanocobalamin 1000 MCG SUBL 1000 mg sublingual daily.   Emollient (CETAPHIL) cream Apply 1 application topically daily.   lisinopril (ZESTRIL) 20 MG tablet Take 1 tablet (20 mg total) by mouth daily.   simvastatin (ZOCOR) 20 MG tablet Take 1 tablet (20 mg total) by mouth at bedtime.   triamcinolone (NASACORT) 55 MCG/ACT AERO nasal inhaler Place 1 spray into the nose daily as needed (allergies).   No facility-administered encounter medications on file as of 01/29/2022.    Allergies (verified) Levofloxacin, Neosporin [neomycin-bacitracin zn-polymyx], and Tape   History: Past Medical History:  Diagnosis Date   Anemia    Early 20's   [place]rial [place] 10/07/2014   3-14 day cardiac monitor 03/2019:  AFib, Avg HR 69, no significant pauses, PVCs, wide complex runs (aberrancy vs NSVT - longest 32 beats).   Atrial flutter ([place] 2008   Status post RFCA Dr GCristopher [place]2008   Carotid artery plaque    Carotid [place]8/21: Bilat ICA 1-39   Chronic atrial fibrillation ([place]    Eliquis   Closed nondisplaced fracture of left patella 01/20/2020   Dermatochalasis of both  upper eyelids 07/01/2014   Dysrhythmia    a fib    Echocardiograms    Echo 12/14 - Severe basal septal hypertrophy, moderate LVH, EF 60-65%, moderate LAE, mild RAE // Echo 7/20: EF 60-65, severe asymmetric LVH, mild RVE, normal RV SF, moderate BAE, moderate MAC, moderate aortic valve calcification (noncoronary cusp), mild dilation of ascending aorta (41 mm)   Hx of adenomatous colonic polyps    Hyperlipidemia    Hypertension    Malignant neoplasm of prostate ([place]) 05/18/2012   Neuromuscular disorder (HCC)    neuropathy feet   Patellar sleeve fracture of left knee 2021   Prostate cancer ([place]) 2006   Prostatectomy    Shingles 01/2019   left side of face/eye/neck/ear    SKIN CANCER, HX OF 03/11/2009   Moh's L hand for Squamous Cell;2 other squamous cells removed w/o Mohs 2013 Rolm Bookbinder MD  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   Past Surgical History:  Procedure Laterality Date   BLADDER SUSPENSION  2010   COLONOSCOPY W/ POLYPECTOMY  2003;2010;04/2014   Tubular adenoma: recall 5 yrs (after 04/2019)   EYE SURGERY Bilateral 2021,2022   HERNIA REPAIR  07/2008   Dr.Martin   INGUINAL HERNIA REPAIR Left 08/25/2020   Procedure: OPEN LEFT INGUINAL HERNIA REPAIR WITH MESH;  Surgeon: Johnathan Hausen, MD;  Location: [facility];  Service: General;  Laterality: Left;   MOHS SURGERY     left hand   MOHS SURGERY Left 12/2020   ear   PROSTATECTOMY  2006   Robotic for adenocarcinoma; Dr Lawerance Bach   RADIOFREQUENCY ABLATION  01/15/2007   for ectopic atrial foci   SEPTOPLASTY  Age 38   TRANSTHORACIC ECHOCARDIOGRAM  09/02/2013   Normal.  EF 60%.  LAE and RAE.   XI ROBOTIC ASSISTED INGUINAL HERNIA REPAIR WITH MESH Right 01/11/2021   Procedure: XI ROBOTIC ASSISTED  RIGHT INGUINAL HERNIA REPAIR WITH MESH;  Surgeon: Johnathan Hausen, MD;  Location: [facility];  Service: General;  Laterality: Right;   Family History  Problem Relation Age of Onset   Lung disease Father        Black Lung   Heart disease Father        Congenital   Emphysema Father    COPD Father    Diabetes Mother    Stroke Mother 72   Hypertension Brother    Heart disease Brother    Hyperlipidemia Brother    Heart attack Brother 34       sudden death   Cancer Neg Hx    Colon cancer Neg Hx    Esophageal cancer Neg Hx    Rectal cancer Neg Hx    Stomach cancer Neg Hx    Social History   Socioeconomic History   Marital status: Married    Spouse name: Not on file   Number of children: 1   Years of education: Not on file   Highest education level: Master's degree (e.g., MA, MS, MEng, MEd, MSW, MBA)  Occupational History   Occupation:  retired  Tobacco Use   Smoking status: Former    Types: Cigarettes    Quit date: 09/06/2007    Years since quitting: 14.4   Smokeless tobacco: Never   Tobacco comments:    smoked 1963 -2009 . Cigars < 1 / day on average 1992-2009  Vaping Use   Vaping Use: Never used  Substance and Sexual Activity   Alcohol use: Yes    Alcohol/week: 7.0 standard drinks  Types: 7 Glasses of wine per week    Comment: Socially   Drug use: No   Sexual activity: Not Currently    Partners: Female  Other Topics Concern   Not on file  Social History Narrative   Married, 1 son, 2 grand-daughters.   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   Alcohol: 1 glass red wine per night.   Was once a long distance runner.   Social Determinants of Health   Financial Resource Strain: Low Risk    Difficulty of Paying Living Expenses: Not hard at all  Food Insecurity: No Food Insecurity   Worried About Charity fundraiser in the Last Year: Never true   [place] in the Last Year: Never true  Transportation Needs: No Transportation Needs   Lack of Transportation (Medical): No   Lack of Transportation (Non-Medical): No  Physical Activity: Sufficiently Active   Days of Exercise per Week: 5 days   Minutes of Exercise per Session: 100 min  Stress: Stress Concern Present   Feeling of Stress : To some extent  Social Connections: Socially Isolated   Frequency of Communication with Friends and Family: Once a week   Frequency of Social Gatherings with Friends and Family: Once a week   Attends Religious Services: Never   Marine scientist or Organizations: No   Attends Music therapist: Not on file   Marital Status: Married    Tobacco Counseling Counseling given: Not Answered Tobacco comments: smoked 1963 -2009 . Cigars < 1 / day on average 1992-2009   Clinical Intake:  Pre-visit preparation completed: No  Pain : 0-10 Pain Score: 3  Pain  Type: Chronic pain Pain Location: Back     BMI - recorded: 22 Nutritional Status: BMI of 19-24  Normal Nutritional Risks: None Diabetes: Yes  How often do you need to have someone help you when you read instructions, pamphlets, or other written materials from your doctor or pharmacy?: 1 - Never What is the last grade level you completed in school?: master  Diabetic?Yes  Interpreter Needed?: No  Information entered by :: Toria C., RMA   Activities of Daily Living    01/29/2022    9:38 AM  In your present state of health, do you have any difficulty performing the following activities:  Hearing? 0  Vision? 0  Difficulty concentrating or making decisions? 0  Walking or climbing stairs? 0  Dressing or bathing? 0  Doing errands, shopping? 0  Preparing Food and eating ? N  Using the Toilet? N  In the past six months, have you accidently leaked urine? N  Do you have problems with loss of bowel control? N  Managing your Medications? N  Managing your Finances? N  Housekeeping or managing your Housekeeping? N    Patient Care Team: Ma Hillock, DO as PCP - General (Family Medicine) Sherren Mocha, MD as PCP - Cardiology (Cardiology) Rod Can, MD as Consulting Physician (Orthopedic Surgery) Sharmon Revere as Physician Assistant (Cardiology) Rolm Bookbinder, MD as Consulting Physician (Dermatology) Myrlene Broker, MD as Attending Physician (Urology) Warden Fillers, MD as Consulting Physician (Ophthalmology) Rosemarie Ax, MD as Consulting Physician (Sports Medicine)  Indicate any recent Medical Services you may have received from other than [facility] providers in the past year (date may be approximate).     Assessment:   This is a routine wellness examination for Yannis.  Hearing/Vision screen No results found.  Dietary issues and exercise activities discussed: Current Exercise Habits: Home exercise routine, Type of exercise: walking, Intensity:  Moderate, Exercise limited by: orthopedic condition(s)   Goals Addressed             This Visit's Progress    Acute Low Back Pain Managed       Evidence-based guidance:  Set pain management goals; mutually determine pain management plan and review plan regularly.  Consider the presence and impact of preexisting chronic pain.   Use a consistent, validated tool for pain assessment including function and quality of life.  Provide anticipatory guidance that bedrest is rarely prescribed and only with severe symptoms including leg pain; bedrest any longer than 4 days leads to debilitation and is not recommended.  Encourage temporarily avoiding activities that may cause spinal stress including unsupported sitting, heavy lifting, bending or twisting the back and a gradual return to usual activities.  Prepare patient for use of pharmacologic therapy in a stepped, single agent or combination approach that may include acetaminophen, nonsteroidal anti-inflammatory drug, antidepressant, nonbenzodiazepine muscle relaxant.  Evaluate risk for use of opioid medication, which may be prescribed for a limited amount of time.  Evaluate pain level, efficacy, tolerability and adherence to pain management plan.  Manage medication-induced effects, such as constipation, nausea, urinary retention, somnolence and dizziness.  Encourage use of multimodal interventions, such as physical activity, therapeutic exercise, massage, chiropractic manipulation, manual therapy, herbal supplement, application of heat or cold.  Provide frequent follow-up and monitoring of symptoms if recovery is not achieved within 3 months.   Notes:       Depression Screen    01/29/2022    9:33 AM 06/22/2021   11:03 AM 08/18/2020    9:26 AM 02/26/2019   12:13 PM 06/05/2018    8:55 AM 08/24/2017   10:22 AM 06/05/2015    3:44 PM  PHQ 2/9 Scores  PHQ - 2 Score 0 0 0 0 0 0 0    Fall Risk    01/29/2022    9:48 AM 01/20/2022   10:40 AM  08/16/2021   11:19 AM 06/22/2021   11:03 AM 08/18/2020    9:25 AM  Fall Risk   Falls in the past year? _0 0 0  Number falls in past yr: 1 1 0 0 0  Injury with Fall? 1 0 1 0 0  Risk for fall due to : History of fall(s) History of fall(s)     Follow up Falls evaluation completed Falls evaluation completed  Falls evaluation completed Falls evaluation completed    [place]:  Any stairs in or around the home? Yes  If so, are there any without handrails? Yes  Home free of loose throw rugs in walkways, pet beds, electrical cords, etc? No  Adequate lighting in your home to reduce risk of falls? No   ASSISTIVE DEVICES UTILIZED TO PREVENT FALLS:  Life alert? No  Use of a cane, walker or w/c? No  Grab bars in the bathroom? No  Shower chair or bench in shower? No  Elevated toilet seat or a handicapped toilet? Yes   TIMED UP AND GO:  Was the test performed? No .   Cognitive Function:        Immunizations Immunization History  Administered Date(s) Administered   Fluad Quad(high Dose 65+) 05/01/2019, 05/20/2020, 05/04/2021   Hep A / Hep B 08/04/2009   Influenza Split 06/15/2011, 06/05/2012  Influenza Whole 07/10/2007, 06/12/2008, 06/05/2009, 05/19/2010   Influenza, High Dose Seasonal PF 06/01/2016, 05/11/2017, 06/04/2018   Influenza,inj,Quad PF,6+ Mos 05/29/2013, 05/06/2014, 05/14/2015   PFIZER(Purple Top)SARS-COV-2 Vaccination 10/10/2019, 10/30/2019, 07/06/2020   Pfizer Covid-19 Vaccine Bivalent Booster 15yr & up 08/13/2021   Pneumococcal Conjugate-13 07/03/2014, 07/07/2015   Pneumococcal Polysaccharide-23 06/08/2012   Td 09/06/1999, 02/26/2019   Tdap 12/06/2010   Zoster, Live 09/07/2009    TDAP status: Up to date  Flu Vaccine status: Up to date  Pneumococcal vaccine status: Up to date  Covid-19 vaccine status: Completed vaccines  Qualifies for Shingles Vaccine? Yes   Zostavax completed Yes   Shingrix Completed?: No.     Education has been provided regarding the importance of this vaccine. Patient has been advised to call insurance company to determine out of pocket expense if they have not yet received this vaccine. Advised may also receive vaccine at local pharmacy or Health Dept. Verbalized acceptance and understanding.  Screening Tests Health Maintenance  Topic Date Due   Zoster Vaccines- Shingrix (1 of 2) Never done   FOOT EXAM  02/26/2020   HEMOGLOBIN A1C  12/21/2021   INFLUENZA VACCINE  04/05/2022   OPHTHALMOLOGY EXAM  04/29/2022   TETANUS/TDAP  02/25/2029   Pneumonia Vaccine 76 Years old  Completed   COVID-19 Vaccine  Completed   Hepatitis C Screening  Completed   HPV VACCINES  Aged Out   COLONOSCOPY (Pts 45-446yrInsurance coverage will need to be confirmed)  Discontinued    Health Maintenance  Health Maintenance Due  Topic Date Due   Zoster Vaccines- Shingrix (1 of 2) Never done   FOOT EXAM  02/26/2020   HEMOGLOBIN A1C  12/21/2021    Colorectal cancer screening: No longer required.   Lung Cancer Screening: (Low Dose CT Chest recommended if Age 76-80ears, 30 pack-year currently smoking OR have quit w/in 15years.) does not qualify.     Additional Screening:  Hepatitis C Screening: does not qualify; Completed 08/19/2016  Vision Screening: Recommended annual ophthalmology exams for early detection of glaucoma and other disorders of the eye. Is the patient up to date with their annual eye exam?  Yes  Who is the provider or what is the name of the office in which the patient attends annual eye exams? [facility]  Dental Screening: Recommended annual dental exams for proper oral hygiene  Community Resource Referral / Chronic Care Management: CRR required this visit?  No   CCM required this visit?  No      Plan:     I have personally reviewed and noted the following in the patient's chart:   Medical and social history Use of alcohol, tobacco or illicit drugs   Current medications and supplements including opioid prescriptions. Patient is not currently taking opioid prescriptions. Functional ability and status Nutritional status Physical activity Advanced directives List of other physicians Hospitalizations, surgeries, and ER visits in previous 12 months Vitals Screenings to include cognitive, depression, and falls Referrals and appointments  In addition, I have reviewed and discussed with patient certain preventive protocols, quality metrics, and best practice recommendations. A written personalized care plan for preventive services as well as general preventive health recommendations were provided to patient.     GeKavin LeechCMGastroenterology And Liver Disease Medical Center Inc 01/29/2022   Nurse Notes: Non face to face 20 minutes.  Mr. GoNitschke Thank you for taking time to come for your Medicare Wellness Visit. I appreciate your ongoing commitment to your health goals. Please review the following  plan we discussed and let me know if I can assist you in the future.   These are the goals we discussed:  Goals      Acute Low Back Pain Managed     Evidence-based guidance:  Set pain management goals; mutually determine pain management plan and review plan regularly.  Consider the presence and impact of preexisting chronic pain.   Use a consistent, validated tool for pain assessment including function and quality of life.  Provide anticipatory guidance that bedrest is rarely prescribed and only with severe symptoms including leg pain; bedrest any longer than 4 days leads to debilitation and is not recommended.  Encourage temporarily avoiding activities that may cause spinal stress including unsupported sitting, heavy lifting, bending or twisting the back and a gradual return to usual activities.  Prepare patient for use of pharmacologic therapy in a stepped, single agent or combination approach that may include acetaminophen, nonsteroidal anti-inflammatory drug, antidepressant,  nonbenzodiazepine muscle relaxant.  Evaluate risk for use of opioid medication, which may be prescribed for a limited amount of time.  Evaluate pain level, efficacy, tolerability and adherence to pain management plan.  Manage medication-induced effects, such as constipation, nausea, urinary retention, somnolence and dizziness.  Encourage use of multimodal interventions, such as physical activity, therapeutic exercise, massage, chiropractic manipulation, manual therapy, herbal supplement, application of heat or cold.  Provide frequent follow-up and monitoring of symptoms if recovery is not achieved within 3 months.   Notes:      Exercise 150 minutes per week (moderate activity)     Goal to marathon;       Patient Stated     Maintain current health by staying active.          This is a list of the screening recommended for you and due dates:  Health Maintenance  Topic Date Due   Zoster (Shingles) Vaccine (1 of 2) Never done   Complete foot exam   02/26/2020   Hemoglobin A1C  12/21/2021   Flu Shot  04/05/2022   Eye exam for diabetics  04/29/2022   Tetanus Vaccine  02/25/2029   Pneumonia Vaccine  Completed   COVID-19 Vaccine  Completed   Hepatitis C Screening: USPSTF Recommendation to screen - Ages 89-79 yo.  Completed   HPV Vaccine  Aged Out   Colon Cancer Screening  Discontinued

## 2022-02-03 ENCOUNTER — Ambulatory Visit (INDEPENDENT_AMBULATORY_CARE_PROVIDER_SITE_OTHER): Payer: Medicare Other | Admitting: Family Medicine

## 2022-02-03 ENCOUNTER — Encounter: Payer: Self-pay | Admitting: Family Medicine

## 2022-02-03 VITALS — BP 127/75 | HR 56 | Temp 98.3°F | Ht 75.0 in | Wt 183.0 lb

## 2022-02-03 DIAGNOSIS — I700 Atherosclerosis of aorta: Secondary | ICD-10-CM

## 2022-02-03 DIAGNOSIS — M546 Pain in thoracic spine: Secondary | ICD-10-CM

## 2022-02-03 DIAGNOSIS — I4821 Permanent atrial fibrillation: Secondary | ICD-10-CM | POA: Diagnosis not present

## 2022-02-03 DIAGNOSIS — R7303 Prediabetes: Secondary | ICD-10-CM | POA: Diagnosis not present

## 2022-02-03 DIAGNOSIS — M8580 Other specified disorders of bone density and structure, unspecified site: Secondary | ICD-10-CM

## 2022-02-03 DIAGNOSIS — E7800 Pure hypercholesterolemia, unspecified: Secondary | ICD-10-CM | POA: Diagnosis not present

## 2022-02-03 DIAGNOSIS — E538 Deficiency of other specified B group vitamins: Secondary | ICD-10-CM

## 2022-02-03 DIAGNOSIS — I10 Essential (primary) hypertension: Secondary | ICD-10-CM | POA: Diagnosis not present

## 2022-02-03 DIAGNOSIS — E119 Type 2 diabetes mellitus without complications: Secondary | ICD-10-CM

## 2022-02-03 DIAGNOSIS — E559 Vitamin D deficiency, unspecified: Secondary | ICD-10-CM | POA: Diagnosis not present

## 2022-02-03 LAB — POCT GLYCOSYLATED HEMOGLOBIN (HGB A1C)
HbA1c POC (<> result, manual entry): 6.1 % (ref 4.0–5.6)
HbA1c, POC (controlled diabetic range): 6.1 % (ref 0.0–7.0)
HbA1c, POC (prediabetic range): 6.1 % (ref 5.7–6.4)
Hemoglobin A1C: 6.1 % — AB (ref 4.0–5.6)

## 2022-02-03 LAB — MICROALBUMIN / CREATININE URINE RATIO
Creatinine,U: 75.4 mg/dL
Microalb Creat Ratio: 0.9 mg/g (ref 0.0–30.0)
Microalb, Ur: <0.7 mg/dL (ref 0.0–1.9)

## 2022-02-03 MED ORDER — ALENDRONATE SODIUM 70 MG PO TABS
70.0000 mg | ORAL_TABLET | ORAL | 11 refills | Status: DC
Start: 2022-02-03 — End: 2022-12-15

## 2022-02-03 MED ORDER — DOXYCYCLINE HYCLATE 100 MG PO TABS: 100.0000 mg | ORAL_TABLET | Freq: Two times a day (BID) | ORAL | 0 refills | Status: DC

## 2022-02-03 NOTE — Patient Instructions (Signed)
No follow-ups on file.        Great to see you today.  I have refilled the medication(s) we provide.   If labs were collected, we will inform you of lab results once received either by echart message or telephone call.   - echart message- for normal results that have been seen by the patient already.   - telephone call: abnormal results or if patient has not viewed results in their echart.  

## 2022-02-03 NOTE — Progress Notes (Signed)
Mike Day , Jan 30, 1946, 76 y.o., male MRN: [id number] Patient Care Team    Relationship Specialty Notifications Start End  Ma Hillock, DO PCP - General Family Medicine  06/11/18   Sherren Mocha, MD PCP - Cardiology Cardiology  08/21/20   Rod Can, MD Consulting Physician Orthopedic Surgery  10/03/15   Sharmon Revere Physician Assistant Cardiology  04/29/16   Rolm Bookbinder, MD Consulting Physician Dermatology  08/24/17   Myrlene Broker, MD Attending Physician Urology  06/11/18   Warden Fillers, MD Consulting Physician Ophthalmology  06/11/18   Rosemarie Ax, MD Consulting Physician Sports Medicine  01/21/20     Chief Complaint  Patient presents with   Hyperlipidemia    [facility]; pt is fasting     Subjective: Mike Day is a 76 y.o. male present for routine chronic condition follow-up Essential hypertension/hyperlipidemia/permanent atrial fibrillation/aortic atherosclerosis Pt reports compliance with lisinopril, Zocor, amlodipine, Zocor described by cardiology.  Blood pressures ranges at home normal. Patient denies chest pain, shortness of breath or lower extremity edema.   Diabetes mellitus without complication (HCC)> prediabetes: Patient has been able to control his A1c with routine exercise and diet alone.  He is now in the prediabetic range and has remained stable. Patient denies dizziness, hyperglycemic or hypoglycemic events. Patient denies numbness, tingling in the extremities or nonhealing wounds of feet.   B12/vit d def Supplementing with vitamin D and B12. Osteopenia: started fosamax-tolerating.  Backpain: Patient has now established with sports med and had his first OMT.  He states he felt it was very helpful and plans to schedule routinely.    01/29/2022    9:33 AM 06/22/2021   11:03 AM 08/18/2020    9:26 AM 02/26/2019   12:13 PM 06/05/2018    8:55 AM  Depression screen PHQ 2/9  Decreased Interest 0 0 0 0 0  Down, Depressed,  Hopeless 0 0 0 0 0  PHQ - 2 Score 0 0 0 0 0    Allergies  Allergen Reactions   Levofloxacin Rash   Neosporin [Neomycin-Bacitracin Zn-Polymyx] Other (See Comments)    Dry/scaly skin/blistering    Tape Other (See Comments)    ADHESIVE TAPE; BLISTERS,   Social History   Social History Narrative   Married, 1 son, 2 grand-daughters.   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   Alcohol: 1 glass red wine per night.   Was once a long distance runner.   Past Medical History:  Diagnosis Date   Anemia    Early 20's   Atrial fibrillation ([place]) 10/07/2014   3-14 day cardiac monitor 03/2019:  AFib, Avg HR 69, no significant pauses, PVCs, wide complex runs (aberrancy vs NSVT - longest 32 beats).   Atrial flutter ([place]) 2008   Status post RFCA Dr Cristopher Peru 2008   Carotid artery plaque    Carotid US 8/21: Bilat ICA 1-39   Chronic atrial fibrillation (HCC)    Eliquis   Closed nondisplaced fracture of left patella 01/20/2020   Dermatochalasis of both upper eyelids 07/01/2014   Dysrhythmia    a fib    Echocardiograms    Echo 12/14 - Severe basal septal hypertrophy, moderate LVH, EF 60-65%, moderate LAE, mild RAE // Echo 7/20: EF 60-65, severe asymmetric LVH, mild RVE, normal RV SF, moderate BAE, moderate MAC, moderate aortic valve calcification (noncoronary cusp), mild dilation of ascending aorta (41 mm)  Hx of adenomatous colonic polyps    Hyperlipidemia    Hypertension    Malignant neoplasm of prostate ([place]) 05/18/2012   Neuromuscular disorder ([place])    neuropathy feet   Patellar sleeve fracture of left knee 2021   Prostate cancer ([place]) 2006   Prostatectomy   Shingles 01/2019   left side of face/eye/neck/ear    SKIN CANCER, HX OF 03/11/2009   Moh's L hand for Squamous Cell;2 other squamous cells removed w/o Mohs 2013 Rolm Bookbinder MD  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   Past Surgical History:  Procedure Laterality Date   BLADDER  SUSPENSION  2010   COLONOSCOPY W/ POLYPECTOMY  2003;2010;04/2014   Tubular adenoma: recall 5 yrs (after 04/2019)   EYE SURGERY Bilateral 2021,2022   HERNIA REPAIR  07/2008   Dr.Martin   INGUINAL HERNIA REPAIR Left 08/25/2020   Procedure: OPEN LEFT INGUINAL HERNIA REPAIR WITH MESH;  Surgeon: Johnathan Hausen, MD;  Location: [facility];  Service: General;  Laterality: Left;   MOHS SURGERY     left hand   MOHS SURGERY Left 12/2020   ear   PROSTATECTOMY  2006   Robotic for adenocarcinoma; Dr Lawerance Bach   RADIOFREQUENCY ABLATION  01/15/2007   for ectopic atrial foci   SEPTOPLASTY  Age 52   TRANSTHORACIC ECHOCARDIOGRAM  09/02/2013   Normal.  EF 60%.  LAE and RAE.   XI ROBOTIC ASSISTED INGUINAL HERNIA REPAIR WITH MESH Right 01/11/2021   Procedure: XI ROBOTIC ASSISTED  RIGHT INGUINAL HERNIA REPAIR WITH MESH;  Surgeon: Johnathan Hausen, MD;  Location: [facility];  Service: General;  Laterality: Right;   Family History  Problem Relation Age of Onset   Lung disease Father        Black Lung   Heart disease Father        Congenital   Emphysema Father    COPD Father    Diabetes Mother    Stroke Mother 13   Hypertension Brother    Heart disease Brother    Hyperlipidemia Brother    Heart attack Brother 81       sudden death   Cancer Neg Hx    Colon cancer Neg Hx    Esophageal cancer Neg Hx    Rectal cancer Neg Hx    Stomach cancer Neg Hx    Allergies as of 02/03/2022       Reactions   Levofloxacin Rash   Neosporin [neomycin-bacitracin Zn-polymyx] Other (See Comments)   Dry/scaly skin/blistering    Tape Other (See Comments)   ADHESIVE TAPE; BLISTERS,        Medication List        Accurate as of February 03, 2022  9:45 AM. If you have any questions, ask your nurse or doctor.          acetaminophen 500 MG tablet Commonly known as: TYLENOL Take 1,000 mg by mouth every 8 (eight) hours as needed for moderate pain.   alendronate 70 MG tablet Commonly known as: FOSAMAX Take 1 tablet (70 mg  total) by mouth every 7 (seven) days. Take with a full glass of water on an empty stomach.   amLODipine 2.5 MG tablet Commonly known as: NORVASC Take 1 tablet (2.5 mg total) by mouth daily.   apixaban 5 MG Tabs tablet Commonly known as: Eliquis Take 1 tablet (5 mg total) by mouth 2 (two) times daily.   ARTIFICIAL TEAR SOLUTION OP Place 1 drop into both eyes daily as needed (  dry eyes).   cetaphil cream Apply 1 application topically daily.   cholecalciferol 25 MCG (1000 UNIT) tablet Commonly known as: VITAMIN D3 Take 2 tablets (2,000 Units total) by mouth daily.   Cyanocobalamin 1000 MCG Subl 1000 mg sublingual daily.   doxycycline 100 MG tablet Commonly known as: VIBRA-TABS Take 1 tablet (100 mg total) by mouth 2 (two) times daily. Started by: Howard Pouch, DO   lisinopril 20 MG tablet Commonly known as: ZESTRIL Take 1 tablet (20 mg total) by mouth daily.   simvastatin 20 MG tablet Commonly known as: ZOCOR Take 1 tablet (20 mg total) by mouth at bedtime.   triamcinolone 55 MCG/ACT Aero nasal inhaler Commonly known as: NASACORT Place 1 spray into the nose daily as needed (allergies).        All past medical history, surgical history, allergies, family history, immunizations andmedications were updated in the EMR today and reviewed under the history and medication portions of their EMR.     ROS: Negative, with the exception of above mentioned in HPI   Objective:  BP 127/75   Pulse (!) 56   Temp 98.3 F (36.8 C) (Oral)   Ht _0  (1.905 m)   Wt 183 lb (83 kg)   SpO2 99%   BMI 22.87 kg/m  Body mass index is 22.87 kg/m. Physical Exam Vitals and nursing note reviewed.  Constitutional:      General: He is not in acute distress.    Appearance: Normal appearance. He is not ill-appearing, toxic-appearing or diaphoretic.  HENT:     Head: Normocephalic and atraumatic.  Eyes:     General: No scleral icterus.       Right eye: No discharge.        Left eye: No  discharge.     Extraocular Movements: Extraocular movements intact.     Pupils: Pupils are equal, round, and reactive to light.  Cardiovascular:     Rate and Rhythm: Normal rate and regular rhythm.  Pulmonary:     Effort: Pulmonary effort is normal. No respiratory distress.     Breath sounds: Normal breath sounds. No wheezing, rhonchi or rales.  Musculoskeletal:     Cervical back: Neck supple.     Right lower leg: No edema.     Left lower leg: No edema.  Lymphadenopathy:     Cervical: No cervical adenopathy.  Skin:    General: Skin is warm and dry.     Coloration: Skin is not jaundiced or pale.     Findings: No rash.  Neurological:     Mental Status: He is alert and oriented to person, place, and time. Mental status is at baseline.  Psychiatric:        Mood and Affect: Mood normal.        Behavior: Behavior normal.        Thought Content: Thought content normal.        Judgment: Judgment normal.   No results found. No results found. Results for orders placed or performed in visit on 02/03/22 (from the past 24 hour(s))  POCT HgB A1C     Status: Abnormal   Collection Time: 02/03/22  9:28 AM  Result Value Ref Range   Hemoglobin A1C 6.1 (A) 4.0 - 5.6 %   HbA1c POC (<> result, manual entry) 6.1 4.0 - 5.6 %   HbA1c, POC (prediabetic range) 6.1 5.7 - 6.4 %   HbA1c, POC (controlled diabetic range) 6.1 0.0 - 7.0 %  Assessment/Plan: Mike Day is a 76 y.o. male present for OV for  prediabetes - Hemoglobin A1c>6.1 -Foot exam completed today Urine microalbumin collected today Follow-up every 6 months  Essential hypertension/Chronic anticoagulation/A. Fib/Aortic atherosclerosis (HCC) Stable. Continue lisinopril 20 mg daily, amlodipine 2.5 mg daily, Eliquis 5 mg twice daily and Zocor 20 mg daily prescribed by cardiology Established with cardiology.  Continue routine follow-ups.  Acute right-sided thoracic back pain Improving with OMT through sports med.  Osteopenia,  unspecified location/Vitamin D deficiency Patient is supplementing vitamin D, last lab 08/2021 with a vitamin D level of 25 DEXA completed 09/07/2021> -2.4 >tolerating Fosamax weekly  B12 deficiency B12 level 08/2021 well supplemented with the B12 is 683    Reviewed expectations re: course of current medical issues. Discussed self-management of symptoms. Outlined signs and symptoms indicating need for more acute intervention. Patient verbalized understanding and all questions were answered. Patient received an After-Visit Summary.    Orders Placed This Encounter  Procedures   POCT HgB A1C   Meds ordered this encounter  Medications   alendronate (FOSAMAX) 70 MG tablet    Sig: Take 1 tablet (70 mg total) by mouth every 7 (seven) days. Take with a full glass of water on an empty stomach.    Dispense:  4 tablet    Refill:  11   doxycycline (VIBRA-TABS) 100 MG tablet    Sig: Take 1 tablet (100 mg total) by mouth 2 (two) times daily.    Dispense:  20 tablet    Refill:  0    Referral Orders  No referral(s) requested today     Note is dictated utilizing voice recognition software. Although note has been proof read prior to signing, occasional typographical errors still can be missed. If any questions arise, please do not hesitate to call for verification.   electronically signed by:  Howard Pouch, DO  [place]

## 2022-02-09 DIAGNOSIS — Z8719 Personal history of other diseases of the digestive system: Secondary | ICD-10-CM | POA: Diagnosis not present

## 2022-02-09 DIAGNOSIS — Z9889 Other specified postprocedural states: Secondary | ICD-10-CM | POA: Diagnosis not present

## 2022-02-24 NOTE — Progress Notes (Unsigned)
Mike Day D.[facility] [place] Phone: [phone]   Assessment and Plan:     There are no diagnoses linked to this encounter.  ***   Pertinent previous records reviewed include ***   Follow Up: ***     Subjective:   I, Mike Day, am serving as a Education administrator for Mike Day   Chief Complaint: low back and SI pain    HPI:    01/28/2022 Patient is a 76 year old male complaining of low back and SI pain. Patient states approximately 6 months ago after he had a fall.  At that time he was found to have a L1 compression fracture.  He has never had pain at this level.  His pain has been more right lower lumbar.  He has now gone through physical therapy and received great benefit.  However he has noticed over the past couple weeks his back started bothering him in the right lower lumbar area.  He states if he keeps moving around he feels okay but as soon as he stops moving even for short periods of time it becomes tight and uncomfortable again. No numbness or tingling, has been taking one or two tylenol for the pain it helps, also taking a muscle relaxer if its really bad can only sleep on his left side, pain radiates more to the right than it does to the left . [place] if he can that's his goal    02/25/2022 Patient states  Relevant Historical Information: Chronic atrial fibrillation on anticoagulation with Eliquis, hypertension, DM type II, history of prostate cancer  Additional pertinent review of systems negative.   Current Outpatient Medications:    acetaminophen (TYLENOL) 500 MG tablet, Take 1,000 mg by mouth every 8 (eight) hours as needed for moderate pain., Disp: , Rfl:    alendronate (FOSAMAX) 70 MG tablet, Take 1 tablet (70 mg total) by mouth every 7 (seven) days. Take with a full glass of water on an empty stomach., Disp: 4 tablet, Rfl: 11   amLODipine (NORVASC) 2.5  MG tablet, Take 1 tablet (2.5 mg total) by mouth daily., Disp: 90 tablet, Rfl: 3   apixaban (ELIQUIS) 5 MG TABS tablet, Take 1 tablet (5 mg total) by mouth 2 (two) times daily., Disp: 180 tablet, Rfl: 3   ARTIFICIAL TEAR SOLUTION OP, Place 1 drop into both eyes daily as needed (dry eyes)., Disp: , Rfl:    cholecalciferol (VITAMIN D3) 25 MCG (1000 UNIT) tablet, Take 2 tablets (2,000 Units total) by mouth daily., Disp: 180 tablet, Rfl: 3   Cyanocobalamin 1000 MCG SUBL, 1000 mg sublingual daily., Disp: 90 tablet, Rfl: 3   doxycycline (VIBRA-TABS) 100 MG tablet, Take 1 tablet (100 mg total) by mouth 2 (two) times daily., Disp: 20 tablet, Rfl: 0   Emollient (CETAPHIL) cream, Apply 1 application topically daily., Disp: , Rfl:    lisinopril (ZESTRIL) 20 MG tablet, Take 1 tablet (20 mg total) by mouth daily., Disp: 90 tablet, Rfl: 3   simvastatin (ZOCOR) 20 MG tablet, Take 1 tablet (20 mg total) by mouth at bedtime., Disp: 90 tablet, Rfl: 3   triamcinolone (NASACORT) 55 MCG/ACT AERO nasal inhaler, Place 1 spray into the nose daily as needed (allergies)., Disp: , Rfl:    Objective:     There were no vitals filed for this visit.    There is no height or weight on file to  calculate BMI.    Physical Exam:    ***   Electronically signed by:  Mike Day D.[facility] 10:00 AM 02/24/22

## 2022-02-25 ENCOUNTER — Ambulatory Visit (INDEPENDENT_AMBULATORY_CARE_PROVIDER_SITE_OTHER): Payer: Medicare Other | Admitting: Sports Medicine

## 2022-02-25 VITALS — BP 110/82 | HR 67 | Ht 75.0 in | Wt 178.0 lb

## 2022-02-25 DIAGNOSIS — M9905 Segmental and somatic dysfunction of pelvic region: Secondary | ICD-10-CM | POA: Diagnosis not present

## 2022-02-25 DIAGNOSIS — G8929 Other chronic pain: Secondary | ICD-10-CM | POA: Diagnosis not present

## 2022-02-25 DIAGNOSIS — M9903 Segmental and somatic dysfunction of lumbar region: Secondary | ICD-10-CM

## 2022-02-25 DIAGNOSIS — S32010A Wedge compression fracture of first lumbar vertebra, initial encounter for closed fracture: Secondary | ICD-10-CM | POA: Diagnosis not present

## 2022-02-25 DIAGNOSIS — S32010D Wedge compression fracture of first lumbar vertebra, subsequent encounter for fracture with routine healing: Secondary | ICD-10-CM

## 2022-02-25 DIAGNOSIS — M5136 Other intervertebral disc degeneration, lumbar region: Secondary | ICD-10-CM | POA: Diagnosis not present

## 2022-02-25 DIAGNOSIS — M545 Low back pain, unspecified: Secondary | ICD-10-CM | POA: Diagnosis not present

## 2022-02-25 DIAGNOSIS — M9904 Segmental and somatic dysfunction of sacral region: Secondary | ICD-10-CM | POA: Diagnosis not present

## 2022-03-05 ENCOUNTER — Encounter: Payer: Self-pay | Admitting: Family Medicine

## 2022-03-07 NOTE — Telephone Encounter (Signed)
RF request for Tizanidine LOV: 02/03/22 Next ov: 07/18/22 Last written: 07/06/21-01/20/22 (90,0)   Please fill, if appropriate.

## 2022-03-09 MED ORDER — TIZANIDINE HCL 2 MG PO CAPS: 2.0000 mg | ORAL_CAPSULE | Freq: Three times a day (TID) | ORAL | 2 refills | Status: DC

## 2022-03-09 NOTE — Telephone Encounter (Signed)
Refilled zanaflex for patient

## 2022-03-24 NOTE — Progress Notes (Signed)
Mike Day D.[place] [facility] Phone: [phone]   Assessment and Plan:     1. Chronic bilateral low back pain without sciatica 2. Left hip pain -Chronic with exacerbation, subsequent visit - Worsening back and left hip pain after fall yesterday.  Pain is most consistent with greater trochanteric bursitis from trauma fall. - Patient elected for greater trochanteric CSI.  Tolerated well per note below - X-ray obtained in clinic.  My interpretation: No acute fracture or dislocation or vertebral collapse.  Chronic appearing L1 compression fracture that does not correlate with patient's area of pain.  Femoral acetabular cortical changes and decreased space - Start Tylenol 500 to 1000 mg tablets 2-3 times a day for day-to-day pain relief    Procedure: Greater trochanteric bursal injection Side: left  Risks explained and consent was given verbally. The site was cleaned with alcohol prep. A steroid injection was performed with patient in the lateral side-lying position at area of maximum tenderness over greater trochanter using 100m of 1% lidocaine without epinephrine and 170mof kenalog '40mg'$ /ml. This was well tolerated and resulted in symptomatic relief.  Needle was removed, hemostasis achieved, and post injection instructions were explained.  Pt was advised to call or return to clinic if these symptoms worsen or fail to improve as anticipated.    Pertinent previous records reviewed include none   Follow Up: 4 weeks     Subjective:   I, Mike Day, am serving as a scEducation administratoror [employer] Chief Complaint: low back and SI pain    HPI:    01/28/2022 Patient is a [age]ear old male complaining of low back and SI pain. Patient states approximately 6 months ago after he had a fall.  At that time he was found to have a L1 compression fracture.  He has never had pain at this level.  His pain has been more right lower  lumbar.  He has now gone through physical therapy and received great benefit.  However he has noticed over the past couple weeks his back started bothering him in the right lower lumbar area.  He states if he keeps moving around he feels okay but as soon as he stops moving even for short periods of time it becomes tight and uncomfortable again. No numbness or tingling, has been taking one or two tylenol for the pain it helps, also taking a muscle relaxer if its really bad can only sleep on his left side, pain radiates more to the right than it does to the left . Wants to get back to the golf course if he can that's his goal    02/25/2022 Patient states that he is good , still has a little pain but better than it was ,    03/25/2022 Patient states that he fell yesterday and his hip and low back really hurt, he feels as though hes not all they way there , did not hit his head but fell on concrete, no numbness and tingling, just pain wife notes that his left leg is swollen    Relevant Historical Information: Chronic atrial fibrillation on anticoagulation with Eliquis, hypertension, DM type II, history of prostate cancer    Additional pertinent review of systems negative.  Current Outpatient Medications  Medication Sig Dispense Refill   acetaminophen (TYLENOL) 500 MG tablet Take 1,000 mg by mouth every 8 (eight) hours as needed for moderate pain.     alendronate (  FOSAMAX) 70 MG tablet Take 1 tablet (70 mg total) by mouth every 7 (seven) days. Take with a full glass of water on an empty stomach. 4 tablet 11   amLODipine (NORVASC) 2.5 MG tablet Take 1 tablet (2.5 mg total) by mouth daily. 90 tablet 3   apixaban (ELIQUIS) 5 MG TABS tablet Take 1 tablet (5 mg total) by mouth 2 (two) times daily. 180 tablet 3   ARTIFICIAL TEAR SOLUTION OP Place 1 drop into both eyes daily as needed (dry eyes).     cholecalciferol (VITAMIN D3) 25 MCG (1000 UNIT) tablet Take 2 tablets (2,000 Units total) by mouth daily. 180  tablet 3   Cyanocobalamin 1000 MCG SUBL 1000 mg sublingual daily. 90 tablet 3   doxycycline (VIBRA-TABS) 100 MG tablet Take 1 tablet (100 mg total) by mouth 2 (two) times daily. 20 tablet 0   Emollient (CETAPHIL) cream Apply 1 application topically daily.     lisinopril (ZESTRIL) 20 MG tablet Take 1 tablet (20 mg total) by mouth daily. 90 tablet 3   simvastatin (ZOCOR) 20 MG tablet Take 1 tablet (20 mg total) by mouth at bedtime. 90 tablet 3   tizanidine (ZANAFLEX) 2 MG capsule Take 1 capsule (2 mg total) by mouth 3 (three) times daily. 90 capsule 2   triamcinolone (NASACORT) 55 MCG/ACT AERO nasal inhaler Place 1 spray into the nose daily as needed (allergies).     No current facility-administered medications for this visit.      Objective:     Vitals:   03/25/22 1429  Pulse: 85  SpO2: 98%  Weight: 178 lb (80.7 kg)  Height: '6\' 3"'$  (1.905 m)      Body mass index is 22.25 kg/m.    Physical Exam:     General: awake, alert, and oriented no acute distress, nontoxic Skin: no suspicious lesions or rashes Neuro:sensation intact distally with no dificits, normal muscle tone, no atrophy, strength 5/5 in all tested lower ext groups Psych: normal mood and affect, speech clear  Left hip: No deformity, swelling or wasting ROM Flexion 80, ext 50, IR 25, ER 35 TTP significantly greater trochanter and gluteal musculature NTTP over the hip flexors, g  si joint, lumbar spine Negative log roll with FROM Positive FABER for lateral hip pain Positive FADIR for lateral hip pain Positive piriformis test   Gait abnormal, favoring right leg  Electronically signed by:  Mike Day D.[facility] 3:20 PM 03/25/22

## 2022-03-25 ENCOUNTER — Ambulatory Visit (INDEPENDENT_AMBULATORY_CARE_PROVIDER_SITE_OTHER): Payer: Medicare Other | Admitting: Sports Medicine

## 2022-03-25 ENCOUNTER — Ambulatory Visit (INDEPENDENT_AMBULATORY_CARE_PROVIDER_SITE_OTHER): Payer: Medicare Other

## 2022-03-25 VITALS — HR 85 | Ht 75.0 in | Wt 178.0 lb

## 2022-03-25 DIAGNOSIS — G8929 Other chronic pain: Secondary | ICD-10-CM | POA: Diagnosis not present

## 2022-03-25 DIAGNOSIS — M25552 Pain in left hip: Secondary | ICD-10-CM | POA: Diagnosis not present

## 2022-03-25 DIAGNOSIS — M545 Low back pain, unspecified: Secondary | ICD-10-CM

## 2022-03-25 NOTE — Patient Instructions (Addendum)
Good to see you Tylenol [phone] mg 2-3 times a day for pain relief  4 week follow up

## 2022-04-14 NOTE — Progress Notes (Signed)
Mike Day D.[place] Phone: [phone]   Assessment and Plan:     1. Left hip pain 2. Greater trochanteric bursitis of left hip  -Chronic with exacerbation, subsequent visit - Overall improvement with relative rest, Tylenol, greater trochanteric CSI after exacerbation of left hip pain caused from fall on 03/24/2022 - Discussed outpatient could use Voltaren gel topically over areas of superficial pain -Continue Tylenol 500 to 1000 mg tablets 2-3 times a day for day-to-day pain relief -Continue home exercises - Discussed repeat OMT.  Patient feels that OMT was beneficial at first, however he has had decreased benefit at the last few treatments, so we decided to not proceed with additional OMT at this time.  Could be considered at future office visit  Pertinent previous records reviewed include none   Follow Up: As needed if no improvement or worsening of symptoms   Subjective:   I, Moenique Parris, am serving as a Education administrator for [employer]   Chief Complaint: low back and SI pain    HPI:    01/28/2022 Patient is a 76 year old male complaining of low back and SI pain. Patient states approximately 6 months ago after he had a fall.  At that time he was found to have a L1 compression fracture.  He has never had pain at this level.  His pain has been more right lower lumbar.  He has now gone through physical therapy and received great benefit.  However he has noticed over the past couple weeks his back started bothering him in the right lower lumbar area.  He states if he keeps moving around he feels okay but as soon as he stops moving even for short periods of time it becomes tight and uncomfortable again. No numbness or tingling, has been taking one or two tylenol for the pain it helps, also taking a muscle relaxer if its really bad can only sleep on his left side, pain radiates more to the right than it  does to the left . Wants to get back to the golf [place] if he can that's his goal    02/25/2022 Patient states that he is good , still has a little pain but better than it was ,    03/25/2022 Patient states that he fell yesterday and his hip and low back really hurt, he feels as though hes not all they way there , did not hit his head but fell on concrete, no numbness and tingling, just pain wife notes that his left leg is swollen   04/22/2022 Patient states he still has some pain in that hip and left cheek but is getting better each day       Relevant Historical Information: Chronic atrial fibrillation on anticoagulation with Eliquis, hypertension, DM type II, history of prostate cancer  Additional pertinent review of systems negative.   Current Outpatient Medications:    acetaminophen (TYLENOL) 500 MG tablet, Take 1,000 mg by mouth every 8 (eight) hours as needed for moderate pain., Disp: , Rfl:    alendronate (FOSAMAX) 70 MG tablet, Take 1 tablet (70 mg total) by mouth every 7 (seven) days. Take with a full glass of water on an empty stomach., Disp: 4 tablet, Rfl: 11   amLODipine (NORVASC) 2.5 MG tablet, Take 1 tablet (2.5 mg total) by mouth daily., Disp: 90 tablet, Rfl: 3   apixaban (ELIQUIS) 5 MG TABS tablet, Take 1 tablet (5 mg  total) by mouth 2 (two) times daily., Disp: 180 tablet, Rfl: 3   ARTIFICIAL TEAR SOLUTION OP, Place 1 drop into both eyes daily as needed (dry eyes)., Disp: , Rfl:    cholecalciferol (VITAMIN D3) 25 MCG (1000 UNIT) tablet, Take 2 tablets (2,000 Units total) by mouth daily., Disp: 180 tablet, Rfl: 3   Cyanocobalamin 1000 MCG SUBL, 1000 mg sublingual daily., Disp: 90 tablet, Rfl: 3   doxycycline (VIBRA-TABS) 100 MG tablet, Take 1 tablet (100 mg total) by mouth 2 (two) times daily., Disp: 20 tablet, Rfl: 0   Emollient (CETAPHIL) cream, Apply 1 application topically daily., Disp: , Rfl:    lisinopril (ZESTRIL) 20 MG tablet, Take 1 tablet (20 mg total) by mouth  daily., Disp: 90 tablet, Rfl: 3   simvastatin (ZOCOR) 20 MG tablet, Take 1 tablet (20 mg total) by mouth at bedtime., Disp: 90 tablet, Rfl: 3   tizanidine (ZANAFLEX) 2 MG capsule, Take 1 capsule (2 mg total) by mouth 3 (three) times daily., Disp: 90 capsule, Rfl: 2   triamcinolone (NASACORT) 55 MCG/ACT AERO nasal inhaler, Place 1 spray into the nose daily as needed (allergies)., Disp: , Rfl:    Objective:     Vitals:   04/22/22 1401  BP: 136/80  Pulse: 90  SpO2: 100%  Weight: 178 lb (80.7 kg)  Height: '6\' 3"'$  (1.905 m)      Body mass index is 22.25 kg/m.    Physical Exam:    General: awake, alert, and oriented no acute distress, nontoxic Skin: no suspicious lesions or rashes Neuro:sensation intact distally with no dificits, normal muscle tone, no atrophy, strength 5/5 in all tested lower ext groups Psych: normal mood and affect, speech clear   Left hip: No deformity, swelling or wasting ROM Flexion 80, ext 30, IR 15, ER 40 TTP mildly greater trochanter and gluteal musculature NTTP over the hip flexors, g  si joint, lumbar spine Negative log roll with FROM Positive FABER for lateral hip pain Positive FADIR for lateral hip pain Positive piriformis test   Gait normal   Electronically signed by:  Mike Day D.[facility] 2:24 PM 04/22/22

## 2022-04-22 ENCOUNTER — Ambulatory Visit (INDEPENDENT_AMBULATORY_CARE_PROVIDER_SITE_OTHER): Payer: Medicare Other | Admitting: Sports Medicine

## 2022-04-22 VITALS — BP 136/80 | HR 90 | Ht 75.0 in | Wt 178.0 lb

## 2022-04-22 DIAGNOSIS — M25552 Pain in left hip: Secondary | ICD-10-CM | POA: Diagnosis not present

## 2022-04-22 DIAGNOSIS — M7062 Trochanteric bursitis, left hip: Secondary | ICD-10-CM

## 2022-04-22 NOTE — Patient Instructions (Addendum)
Good to see you  Recommend Voltaren gel over painful areas

## 2022-04-28 DIAGNOSIS — C44719 Basal cell carcinoma of skin of left lower limb, including hip: Secondary | ICD-10-CM | POA: Diagnosis not present

## 2022-04-28 DIAGNOSIS — D1801 Hemangioma of skin and subcutaneous tissue: Secondary | ICD-10-CM | POA: Diagnosis not present

## 2022-04-28 DIAGNOSIS — L570 Actinic keratosis: Secondary | ICD-10-CM | POA: Diagnosis not present

## 2022-04-28 DIAGNOSIS — Z85828 Personal history of other malignant neoplasm of skin: Secondary | ICD-10-CM | POA: Diagnosis not present

## 2022-04-28 DIAGNOSIS — L821 Other seborrheic keratosis: Secondary | ICD-10-CM | POA: Diagnosis not present

## 2022-05-24 ENCOUNTER — Ambulatory Visit (INDEPENDENT_AMBULATORY_CARE_PROVIDER_SITE_OTHER): Payer: Medicare Other

## 2022-05-24 ENCOUNTER — Encounter: Payer: Self-pay | Admitting: Family Medicine

## 2022-05-24 DIAGNOSIS — Z23 Encounter for immunization: Secondary | ICD-10-CM | POA: Diagnosis not present

## 2022-07-10 ENCOUNTER — Encounter (HOSPITAL_COMMUNITY): Payer: Self-pay | Admitting: Family Medicine

## 2022-07-10 ENCOUNTER — Other Ambulatory Visit: Payer: Self-pay

## 2022-07-10 ENCOUNTER — Inpatient Hospital Stay (HOSPITAL_COMMUNITY)
Admission: EM | Admit: 2022-07-10 | Discharge: 2022-07-18 | DRG: 481 | Disposition: A | Discharge status: Skilled Nursing Facility | Payer: Medicare Other | Attending: Internal Medicine | Admitting: Internal Medicine

## 2022-07-10 ENCOUNTER — Emergency Department (HOSPITAL_COMMUNITY): Payer: Medicare Other

## 2022-07-10 DIAGNOSIS — S72002A Fracture of unspecified part of neck of left femur, initial encounter for closed fracture: Secondary | ICD-10-CM | POA: Diagnosis not present

## 2022-07-10 DIAGNOSIS — Z85828 Personal history of other malignant neoplasm of skin: Secondary | ICD-10-CM

## 2022-07-10 DIAGNOSIS — G629 Polyneuropathy, unspecified: Secondary | ICD-10-CM | POA: Diagnosis present

## 2022-07-10 DIAGNOSIS — I10 Essential (primary) hypertension: Secondary | ICD-10-CM | POA: Diagnosis present

## 2022-07-10 DIAGNOSIS — S4992XA Unspecified injury of left shoulder and upper arm, initial encounter: Secondary | ICD-10-CM | POA: Diagnosis not present

## 2022-07-10 DIAGNOSIS — Z881 Allergy status to other antibiotic agents status: Secondary | ICD-10-CM | POA: Diagnosis not present

## 2022-07-10 DIAGNOSIS — Z83438 Family history of other disorder of lipoprotein metabolism and other lipidemia: Secondary | ICD-10-CM

## 2022-07-10 DIAGNOSIS — R0902 Hypoxemia: Secondary | ICD-10-CM | POA: Diagnosis not present

## 2022-07-10 DIAGNOSIS — Z7901 Long term (current) use of anticoagulants: Secondary | ICD-10-CM

## 2022-07-10 DIAGNOSIS — S72145A Nondisplaced intertrochanteric fracture of left femur, initial encounter for closed fracture: Principal | ICD-10-CM

## 2022-07-10 DIAGNOSIS — I4821 Permanent atrial fibrillation: Secondary | ICD-10-CM | POA: Diagnosis present

## 2022-07-10 DIAGNOSIS — Z01818 Encounter for other preprocedural examination: Secondary | ICD-10-CM | POA: Diagnosis not present

## 2022-07-10 DIAGNOSIS — I358 Other nonrheumatic aortic valve disorders: Secondary | ICD-10-CM | POA: Diagnosis present

## 2022-07-10 DIAGNOSIS — S52032A Displaced fracture of olecranon process with intraarticular extension of left ulna, initial encounter for closed fracture: Secondary | ICD-10-CM | POA: Diagnosis not present

## 2022-07-10 DIAGNOSIS — Z9181 History of falling: Secondary | ICD-10-CM

## 2022-07-10 DIAGNOSIS — M1612 Unilateral primary osteoarthritis, left hip: Secondary | ICD-10-CM | POA: Diagnosis not present

## 2022-07-10 DIAGNOSIS — E559 Vitamin D deficiency, unspecified: Secondary | ICD-10-CM | POA: Diagnosis not present

## 2022-07-10 DIAGNOSIS — S72009A Fracture of unspecified part of neck of unspecified femur, initial encounter for closed fracture: Secondary | ICD-10-CM | POA: Diagnosis present

## 2022-07-10 DIAGNOSIS — M8580 Other specified disorders of bone density and structure, unspecified site: Secondary | ICD-10-CM | POA: Diagnosis not present

## 2022-07-10 DIAGNOSIS — M7022 Olecranon bursitis, left elbow: Secondary | ICD-10-CM | POA: Diagnosis not present

## 2022-07-10 DIAGNOSIS — R5383 Other fatigue: Secondary | ICD-10-CM | POA: Diagnosis not present

## 2022-07-10 DIAGNOSIS — E7800 Pure hypercholesterolemia, unspecified: Secondary | ICD-10-CM | POA: Diagnosis not present

## 2022-07-10 DIAGNOSIS — D638 Anemia in other chronic diseases classified elsewhere: Secondary | ICD-10-CM | POA: Diagnosis present

## 2022-07-10 DIAGNOSIS — I4891 Unspecified atrial fibrillation: Secondary | ICD-10-CM | POA: Diagnosis not present

## 2022-07-10 DIAGNOSIS — M11262 Other chondrocalcinosis, left knee: Secondary | ICD-10-CM | POA: Diagnosis not present

## 2022-07-10 DIAGNOSIS — I739 Peripheral vascular disease, unspecified: Secondary | ICD-10-CM | POA: Diagnosis not present

## 2022-07-10 DIAGNOSIS — S79912A Unspecified injury of left hip, initial encounter: Secondary | ICD-10-CM | POA: Diagnosis not present

## 2022-07-10 DIAGNOSIS — S72142A Displaced intertrochanteric fracture of left femur, initial encounter for closed fracture: Secondary | ICD-10-CM | POA: Diagnosis not present

## 2022-07-10 DIAGNOSIS — Z8546 Personal history of malignant neoplasm of prostate: Secondary | ICD-10-CM | POA: Diagnosis not present

## 2022-07-10 DIAGNOSIS — M25552 Pain in left hip: Secondary | ICD-10-CM | POA: Diagnosis not present

## 2022-07-10 DIAGNOSIS — Z87891 Personal history of nicotine dependence: Secondary | ICD-10-CM

## 2022-07-10 DIAGNOSIS — G8918 Other acute postprocedural pain: Secondary | ICD-10-CM | POA: Diagnosis not present

## 2022-07-10 DIAGNOSIS — S8992XA Unspecified injury of left lower leg, initial encounter: Secondary | ICD-10-CM | POA: Diagnosis not present

## 2022-07-10 DIAGNOSIS — Z86010 Personal history of colonic polyps: Secondary | ICD-10-CM | POA: Diagnosis not present

## 2022-07-10 DIAGNOSIS — Z9109 Other allergy status, other than to drugs and biological substances: Secondary | ICD-10-CM | POA: Diagnosis not present

## 2022-07-10 DIAGNOSIS — W19XXXD Unspecified fall, subsequent encounter: Secondary | ICD-10-CM | POA: Diagnosis not present

## 2022-07-10 DIAGNOSIS — Z9079 Acquired absence of other genital organ(s): Secondary | ICD-10-CM

## 2022-07-10 DIAGNOSIS — Z8249 Family history of ischemic heart disease and other diseases of the circulatory system: Secondary | ICD-10-CM | POA: Diagnosis not present

## 2022-07-10 DIAGNOSIS — I700 Atherosclerosis of aorta: Secondary | ICD-10-CM | POA: Diagnosis not present

## 2022-07-10 DIAGNOSIS — S42402A Unspecified fracture of lower end of left humerus, initial encounter for closed fracture: Secondary | ICD-10-CM | POA: Diagnosis not present

## 2022-07-10 DIAGNOSIS — R531 Weakness: Secondary | ICD-10-CM | POA: Diagnosis not present

## 2022-07-10 DIAGNOSIS — Z7401 Bed confinement status: Secondary | ICD-10-CM | POA: Diagnosis not present

## 2022-07-10 DIAGNOSIS — W010XXA Fall on same level from slipping, tripping and stumbling without subsequent striking against object, initial encounter: Secondary | ICD-10-CM | POA: Diagnosis present

## 2022-07-10 DIAGNOSIS — D62 Acute posthemorrhagic anemia: Secondary | ICD-10-CM | POA: Insufficient documentation

## 2022-07-10 DIAGNOSIS — Z833 Family history of diabetes mellitus: Secondary | ICD-10-CM

## 2022-07-10 DIAGNOSIS — E119 Type 2 diabetes mellitus without complications: Secondary | ICD-10-CM | POA: Diagnosis present

## 2022-07-10 DIAGNOSIS — S5002XA Contusion of left elbow, initial encounter: Secondary | ICD-10-CM | POA: Diagnosis present

## 2022-07-10 DIAGNOSIS — S72145D Nondisplaced intertrochanteric fracture of left femur, subsequent encounter for closed fracture with routine healing: Secondary | ICD-10-CM | POA: Diagnosis not present

## 2022-07-10 DIAGNOSIS — S7292XA Unspecified fracture of left femur, initial encounter for closed fracture: Secondary | ICD-10-CM | POA: Diagnosis not present

## 2022-07-10 DIAGNOSIS — I4892 Unspecified atrial flutter: Secondary | ICD-10-CM | POA: Diagnosis not present

## 2022-07-10 DIAGNOSIS — N529 Male erectile dysfunction, unspecified: Secondary | ICD-10-CM | POA: Diagnosis not present

## 2022-07-10 DIAGNOSIS — Z79899 Other long term (current) drug therapy: Secondary | ICD-10-CM

## 2022-07-10 DIAGNOSIS — M1712 Unilateral primary osteoarthritis, left knee: Secondary | ICD-10-CM | POA: Diagnosis not present

## 2022-07-10 DIAGNOSIS — E538 Deficiency of other specified B group vitamins: Secondary | ICD-10-CM | POA: Diagnosis not present

## 2022-07-10 DIAGNOSIS — D649 Anemia, unspecified: Secondary | ICD-10-CM | POA: Diagnosis not present

## 2022-07-10 DIAGNOSIS — Y92007 Garden or yard of unspecified non-institutional (private) residence as the place of occurrence of the external cause: Secondary | ICD-10-CM | POA: Diagnosis not present

## 2022-07-10 DIAGNOSIS — S52022A Displaced fracture of olecranon process without intraarticular extension of left ulna, initial encounter for closed fracture: Secondary | ICD-10-CM

## 2022-07-10 DIAGNOSIS — S52022D Displaced fracture of olecranon process without intraarticular extension of left ulna, subsequent encounter for closed fracture with routine healing: Secondary | ICD-10-CM | POA: Diagnosis not present

## 2022-07-10 DIAGNOSIS — K5900 Constipation, unspecified: Secondary | ICD-10-CM | POA: Insufficient documentation

## 2022-07-10 DIAGNOSIS — W19XXXA Unspecified fall, initial encounter: Secondary | ICD-10-CM | POA: Diagnosis not present

## 2022-07-10 DIAGNOSIS — E1140 Type 2 diabetes mellitus with diabetic neuropathy, unspecified: Secondary | ICD-10-CM | POA: Diagnosis not present

## 2022-07-10 DIAGNOSIS — E785 Hyperlipidemia, unspecified: Secondary | ICD-10-CM | POA: Diagnosis not present

## 2022-07-10 DIAGNOSIS — M25522 Pain in left elbow: Secondary | ICD-10-CM | POA: Diagnosis not present

## 2022-07-10 DIAGNOSIS — S52092A Other fracture of upper end of left ulna, initial encounter for closed fracture: Secondary | ICD-10-CM | POA: Diagnosis not present

## 2022-07-10 DIAGNOSIS — E875 Hyperkalemia: Secondary | ICD-10-CM | POA: Diagnosis not present

## 2022-07-10 HISTORY — DX: Fracture of unspecified part of neck of unspecified femur, initial encounter for closed fracture: S72.009A

## 2022-07-10 HISTORY — DX: Displaced fracture of olecranon process without intraarticular extension of left ulna, initial encounter for closed fracture: S52.022A

## 2022-07-10 LAB — CBC
HCT: 39.7 % (ref 39.0–52.0)
Hemoglobin: 13.5 g/dL (ref 13.0–17.0)
MCH: 34.6 pg — ABNORMAL HIGH (ref 26.0–34.0)
MCHC: 34 g/dL (ref 30.0–36.0)
MCV: 101.8 fL — ABNORMAL HIGH (ref 80.0–100.0)
Platelets: 163 10*3/uL (ref 150–400)
RBC: 3.9 MIL/uL — ABNORMAL LOW (ref 4.22–5.81)
RDW: 12.1 % (ref 11.5–15.5)
WBC: 10 10*3/uL (ref 4.0–10.5)
nRBC: 0 % (ref 0.0–0.2)

## 2022-07-10 LAB — BASIC METABOLIC PANEL
Anion gap: 7 (ref 5–15)
BUN: 18 mg/dL (ref 8–23)
CO2: 27 mmol/L (ref 22–32)
Calcium: 9.1 mg/dL (ref 8.9–10.3)
Chloride: 101 mmol/L (ref 98–111)
Creatinine, Ser: 0.86 mg/dL (ref 0.61–1.24)
GFR, Estimated: >60 mL/min (ref >60)
Glucose, Bld: 153 mg/dL — ABNORMAL HIGH (ref 70–99)
Potassium: 4.3 mmol/L (ref 3.5–5.1)
Sodium: 135 mmol/L (ref 135–145)

## 2022-07-10 MED ORDER — ACETAMINOPHEN 500 MG PO TABS
1000.0000 mg | ORAL_TABLET | Freq: Three times a day (TID) | ORAL | Status: DC | PRN
  Administered 2022-07-17: 1000 mg via ORAL
  Filled 2022-07-10: qty 2

## 2022-07-10 MED ORDER — SENNA 8.6 MG PO TABS
1.0000 | ORAL_TABLET | Freq: Two times a day (BID) | ORAL | Status: DC
  Filled 2022-07-10: qty 1

## 2022-07-10 MED ORDER — MORPHINE SULFATE (PF) 2 MG/ML IV SOLN: 0.5000 mg | INTRAVENOUS | Status: DC | PRN

## 2022-07-10 MED ORDER — MORPHINE SULFATE (PF) 4 MG/ML IV SOLN: 4.0000 mg | INTRAVENOUS | Status: DC | PRN

## 2022-07-10 MED ORDER — AMLODIPINE BESYLATE 5 MG PO TABS
2.5000 mg | ORAL_TABLET | Freq: Every day | ORAL | Status: DC
  Administered 2022-07-10 – 2022-07-18 (×9): 2.5 mg via ORAL
  Filled 2022-07-10 (×9): qty 1

## 2022-07-10 MED ORDER — VITAMIN D 25 MCG (1000 UNIT) PO TABS
2000.0000 [IU] | ORAL_TABLET | Freq: Every day | ORAL | Status: DC
  Administered 2022-07-11 – 2022-07-18 (×7): 2000 [IU] via ORAL
  Filled 2022-07-10 (×8): qty 2

## 2022-07-10 MED ORDER — HYDROCODONE-ACETAMINOPHEN 5-325 MG PO TABS
1.0000 | ORAL_TABLET | Freq: Four times a day (QID) | ORAL | Status: DC | PRN
  Administered 2022-07-10: 2 via ORAL
  Filled 2022-07-10: qty 2

## 2022-07-10 MED ORDER — LISINOPRIL 20 MG PO TABS
20.0000 mg | ORAL_TABLET | Freq: Every day | ORAL | Status: DC
  Administered 2022-07-11: 20 mg via ORAL
  Filled 2022-07-10: qty 1

## 2022-07-10 MED ORDER — HYDROCODONE-ACETAMINOPHEN 5-325 MG PO TABS
1.0000 | ORAL_TABLET | ORAL | Status: DC | PRN
  Administered 2022-07-11 – 2022-07-12 (×6): 2 via ORAL
  Filled 2022-07-10 (×6): qty 2

## 2022-07-10 MED ORDER — SIMVASTATIN 20 MG PO TABS
20.0000 mg | ORAL_TABLET | Freq: Every day | ORAL | Status: DC
  Administered 2022-07-10 – 2022-07-17 (×8): 20 mg via ORAL
  Filled 2022-07-10 (×8): qty 1

## 2022-07-10 NOTE — H&P (Signed)
History and Physical    Patient: Mike Day ZDG:[id number] DOB: 11/18/1945 DOA: 07/10/2022 DOS: the patient was seen and examined on 07/10/2022 PCP: Ma Hillock, DO  Patient coming from: Home  Chief Complaint:  Chief Complaint  Patient presents with   Fall   Hip Pain   HPI: Mike Day is a 76 y.o. male with medical history significant of Permanent a.fib on Eliquis,  prostate cancer s/p prostatectomy, HTN, HLD, DM who presents after a mechanical fall.   Pt was working the garden today and tripped and fell down a 34f garden bed. Landed on left side and unable to get up. Had pain to Elbow and LE. Did not hit head. Took last dose Eliquis this morning.  Has had several recurrent falls in the past year but are all mechanical.  He has peripheral neuropathy.  In the ED, he was afebrile, BP up to 178/106.  WBC of 10, Hgb of 13.5. Plt of 163 Na of 135, K of 4.3, glucose of 153, creatinine of 0.86  Left comminuted, mildly displaced fracture of proximal ulna and olecranon. Left hip X-ray with interval deformity in the intertrochanteric of left femur suggesting recent fracture.   Review of Systems: As mentioned in the history of present illness. All other systems reviewed and are negative. Past Medical History:  Diagnosis Date   Anemia    Early 20's   Atrial fibrillation ([place] 10/07/2014   3-14 day cardiac monitor 03/2019:  AFib, Avg HR 69, no significant pauses, PVCs, wide complex runs (aberrancy vs NSVT - longest 32 beats).   Atrial flutter ([facility] 2008   Status post RFCA Dr GCristopher [place]2008   Carotid artery plaque    Carotid [place]8/21: Bilat ICA 1-39   Chronic atrial fibrillation ([place]    Eliquis   Closed nondisplaced fracture of left patella 01/20/2020   Dermatochalasis of both upper eyelids 07/01/2014   Dysrhythmia    a fib    Echocardiograms    Echo 12/14 - Severe basal septal hypertrophy, moderate LVH, EF 60-65%, moderate LAE, mild RAE // Echo 7/20: EF 60-65, severe  asymmetric LVH, mild RVE, normal RV SF, moderate BAE, moderate MAC, moderate aortic valve calcification (noncoronary cusp), mild dilation of ascending aorta (41 mm)   Hx of adenomatous colonic polyps    Hyperlipidemia    Hypertension    Malignant neoplasm of prostate ([facility] 05/18/2012   Neuromuscular disorder ([facility]    neuropathy feet   Patellar sleeve fracture of left knee 2021   Prostate cancer ([facility] 2006   Prostatectomy   Shingles 01/2019   left side of face/eye/neck/ear    SKIN CANCER, HX OF 03/11/2009   Moh's L hand for Squamous Cell;2 other squamous cells removed w/o Mohs 2013 LRolm BookbinderMD  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   Past Surgical History:  Procedure Laterality Date   BLADDER SUSPENSION  2010   COLONOSCOPY W/ POLYPECTOMY  2003;2010;04/2014   Tubular adenoma: recall 5 yrs (after 04/2019)   EYE SURGERY Bilateral 2021,2022   HERNIA REPAIR  07/2008   Dr.Martin   INGUINAL HERNIA REPAIR Left 08/25/2020   Procedure: OPEN LEFT INGUINAL HERNIA REPAIR WITH MESH;  Surgeon: MJohnathan Hausen MD;  Location: [facility];  Service: General;  Laterality: Left;   MOHS SURGERY     left hand   MOHS SURGERY Left 12/2020   ear   PROSTATECTOMY  2006   Robotic for adenocarcinoma; Dr RLawerance Bach  RADIOFREQUENCY ABLATION  01/15/2007   for ectopic atrial foci   SEPTOPLASTY  Age 84   TRANSTHORACIC ECHOCARDIOGRAM  09/02/2013   Normal.  EF 60%.  LAE and RAE.   XI ROBOTIC ASSISTED INGUINAL HERNIA REPAIR WITH MESH Right 01/11/2021   Procedure: XI ROBOTIC ASSISTED  RIGHT INGUINAL HERNIA REPAIR WITH MESH;  Surgeon: Johnathan Hausen, MD;  Location: [facility];  Service: General;  Laterality: Right;   Social History:  reports that he quit smoking about 14 years ago. His smoking use included cigarettes. He has never used smokeless tobacco. He reports current alcohol use of about 7.0 standard drinks of alcohol per week. He reports that he does not use drugs.  Allergies  Allergen Reactions    Levofloxacin Rash   Neosporin [Neomycin-Bacitracin Zn-Polymyx] Other (See Comments)    Dry/scaly skin/blistering    Tape Other (See Comments)    ADHESIVE TAPE; BLISTERS,    Family History  Problem Relation Age of Onset   Lung disease Father        Black Lung   Heart disease Father        Congenital   Emphysema Father    COPD Father    Diabetes Mother    Stroke Mother 68   Hypertension Brother    Heart disease Brother    Hyperlipidemia Brother    Heart attack Brother 52       sudden death   Cancer Neg Hx    Colon cancer Neg Hx    Esophageal cancer Neg Hx    Rectal cancer Neg Hx    Stomach cancer Neg Hx     Prior to Admission medications   Medication Sig Start Date End Date Taking? Authorizing Provider  acetaminophen (TYLENOL) 500 MG tablet Take 1,000 mg by mouth every 8 (eight) hours as needed for moderate pain.    [provider]  alendronate (FOSAMAX) 70 MG tablet Take 1 tablet (70 mg total) by mouth every 7 (seven) days. Take with a full glass of water on an empty stomach. 02/03/22   Kuneff, Renee A, DO  amLODipine (NORVASC) 2.5 MG tablet Take 1 tablet (2.5 mg total) by mouth daily. 12/17/21   Sherren Mocha, MD  apixaban (ELIQUIS) 5 MG TABS tablet Take 1 tablet (5 mg total) by mouth 2 (two) times daily. 12/10/21   [name] T, PA-C  ARTIFICIAL TEAR SOLUTION OP Place 1 drop into both eyes daily as needed (dry eyes).    [provider]  cholecalciferol (VITAMIN D3) 25 MCG (1000 UNIT) tablet Take 2 tablets (2,000 Units total) by mouth daily. 08/20/21   Kuneff, Renee A, DO  Cyanocobalamin 1000 MCG SUBL 1000 mg sublingual daily. 06/23/21   Kuneff, Renee A, DO  doxycycline (VIBRA-TABS) 100 MG tablet Take 1 tablet (100 mg total) by mouth 2 (two) times daily. 02/03/22   Kuneff, Renee A, DO  Emollient (CETAPHIL) cream Apply 1 application topically daily.    [provider]  lisinopril (ZESTRIL) 20 MG tablet Take 1 tablet (20 mg total) by mouth daily. 12/10/21    [name] T, PA-C  simvastatin (ZOCOR) 20 MG tablet Take 1 tablet (20 mg total) by mouth at bedtime. 12/10/21   [name] T, PA-C  tizanidine (ZANAFLEX) 2 MG capsule Take 1 capsule (2 mg total) by mouth 3 (three) times daily. 03/09/22   Kuneff, Renee A, DO  triamcinolone (NASACORT) 55 MCG/ACT AERO nasal inhaler Place 1 spray into the nose daily as needed (allergies).    [provider]  Physical Exam: Vitals:   07/10/22 1839 07/10/22 1900 07/10/22 1930 07/10/22 2015  BP: (!) 179/112 (!) 178/106 (!) 177/96 (!) 176/96  Pulse: 70 65 75 (!) 58  Resp: _0 Temp:      TempSrc:      SpO2: 99% 99% 100% 99%   Constitutional: NAD, calm, comfortable, elderly right-handed gentleman sitting upright in bed Eyes: lids and conjunctivae normal ENMT: Mucous membranes are moist.  Neck: normal, supple, Respiratory: clear to auscultation bilaterally, no wheezing, no crackles. Normal respiratory effort. No accessory muscle use.  Cardiovascular: Regular rate and rhythm, no murmurs / rubs / gallops. No extremity edema. 2+ pedal pulses.   Abdomen: no tenderness, Bowel sounds positive.  Musculoskeletal: no clubbing / cyanosis. No joint deformity upper and lower extremities.  Left upper extremity in long-arm splint and Ace bandages.   Skin: no rashes, lesions, ulcers.  Neurologic: CN 2-12 grossly intact. Strength 5/5 in all 4.  Psychiatric: Normal judgment and insight. Alert and oriented x 3. Normal mood. Data Reviewed:  See HPI  Assessment and Plan: * Hip fracture ([place]) Left intertrochanteric fx of left femur -ortho to evaluate tomorrow. No set OR time. Keep NPO past midnight in case of surgery tomorrow.  -Last Eliquis dose AM of 11/5. Keep holding.  -PRN Hydrocodone and morphine for pain -scheduled bowel regimen  Olecranon fracture, left, closed, initial encounter Left comminuted, mildly displaced fracture of proximal ulna and olecranon. -in long arm splint. Ortho to evaluate  in the morning  Permanent atrial fibrillation (HCC) Holding Eliquis for anticipated hip surgery -last dose AM of 11/5  Essential hypertension Elevated likely due to pain -continue Lisinopril and amlodipine  Pure hypercholesterolemia Continue statin      Advance Care Planning: Full  Consults: orthopedic surgery  Family Communication: Discussed with son and wife at bedside  Severity of Illness: The appropriate patient status for this patient is INPATIENT. Inpatient status is judged to be reasonable and necessary in order to provide the required intensity of service to ensure the patient's safety. The patient's presenting symptoms, physical exam findings, and initial radiographic and laboratory data in the context of their chronic comorbidities is felt to place them at high risk for further clinical deterioration. Furthermore, it is not anticipated that the patient will be medically stable for discharge from the hospital within 2 midnights of admission.   * I certify that at the point of admission it is my clinical judgment that the patient will require inpatient hospital care spanning beyond 2 midnights from the point of admission due to high intensity of service, high risk for further deterioration and high frequency of surveillance required.*  Author: Orene Desanctis, DO 07/10/2022 8:22 PM  For on call review [email or web address].

## 2022-07-10 NOTE — ED Provider Notes (Signed)
[place] DEPT Provider Note   CSN: [id number] Arrival date & time: 07/10/22  1512     History  Chief Complaint  Patient presents with   Fall   Hip Pain    Mike Day is a 76 y.o. male.     Patient has a history of hypertension hypercholesterolemia, atrial fibrillation, aortic atherosclerosis, postherpetic neuralgia, lumbar compression fracture who presents to the ED for evaluation after a fall.  Patient states he was outside working in his yard.  He tripped and ended up falling on the left side.  Patient is on anticoagulation but denies hitting his head.  He is not having a headache.  He denies any neck pain or back pain.  He is having pain primarily on the left side.  Pain is mostly on his left shoulder, left elbow and left hip.  Does complain of some knee pain as well.  Patient has not been able to bear weight.  Home Medications Prior to Admission medications   Medication Sig Start Date End Date Taking? Authorizing Provider  acetaminophen (TYLENOL) 500 MG tablet Take 1,000 mg by mouth every 8 (eight) hours as needed for moderate pain.    [provider]  alendronate (FOSAMAX) 70 MG tablet Take 1 tablet (70 mg total) by mouth every 7 (seven) days. Take with a full glass of water on an empty stomach. 02/03/22   Kuneff, Renee A, DO  amLODipine (NORVASC) 2.5 MG tablet Take 1 tablet (2.5 mg total) by mouth daily. 12/17/21   Sherren Mocha, MD  apixaban (ELIQUIS) 5 MG TABS tablet Take 1 tablet (5 mg total) by mouth 2 (two) times daily. 12/10/21   [name] T, PA-C  ARTIFICIAL TEAR SOLUTION OP Place 1 drop into both eyes daily as needed (dry eyes).    [provider]  cholecalciferol (VITAMIN D3) 25 MCG (1000 UNIT) tablet Take 2 tablets (2,000 Units total) by mouth daily. 08/20/21   Kuneff, Renee A, DO  Cyanocobalamin 1000 MCG SUBL 1000 mg sublingual daily. 06/23/21   Kuneff, Renee A, DO  doxycycline (VIBRA-TABS) 100 MG tablet Take 1  tablet (100 mg total) by mouth 2 (two) times daily. 02/03/22   Kuneff, Renee A, DO  Emollient (CETAPHIL) cream Apply 1 application topically daily.    [provider]  lisinopril (ZESTRIL) 20 MG tablet Take 1 tablet (20 mg total) by mouth daily. 12/10/21   [name] T, PA-C  simvastatin (ZOCOR) 20 MG tablet Take 1 tablet (20 mg total) by mouth at bedtime. 12/10/21   [name] T, PA-C  tizanidine (ZANAFLEX) 2 MG capsule Take 1 capsule (2 mg total) by mouth 3 (three) times daily. 03/09/22   Kuneff, Renee A, DO  triamcinolone (NASACORT) 55 MCG/ACT AERO nasal inhaler Place 1 spray into the nose daily as needed (allergies).    [provider]      Allergies    Levofloxacin, Neosporin [neomycin-bacitracin zn-polymyx], and Tape    Review of Systems   Review of Systems  Physical Exam Updated Vital Signs BP (!) 179/112   Pulse 70   Temp 98 F (36.7 C) (Oral)   Resp 18   SpO2 99%  Physical Exam Vitals and nursing note reviewed.  Constitutional:      General: He is not in acute distress.    Appearance: He is well-developed.  HENT:     Head: Normocephalic and atraumatic.     Right Ear: External ear normal.     Left Ear:  External ear normal.  Eyes:     General: No scleral icterus.       Right eye: No discharge.        Left eye: No discharge.     Conjunctiva/sclera: Conjunctivae normal.  Neck:     Trachea: No tracheal deviation.  Cardiovascular:     Rate and Rhythm: Normal rate and regular rhythm.  Pulmonary:     Effort: Pulmonary effort is normal. No respiratory distress.     Breath sounds: Normal breath sounds. No stridor. No wheezing or rales.  Abdominal:     General: Bowel sounds are normal. There is no distension.     Palpations: Abdomen is soft.     Tenderness: There is no abdominal tenderness. There is no guarding or rebound.  Musculoskeletal:        General: No deformity.     Left shoulder: Tenderness present. No swelling or deformity.     Left elbow:  Swelling present. No lacerations. Tenderness present.     Cervical back: Normal and neck supple.     Thoracic back: Normal.     Lumbar back: Normal.     Left hip: Tenderness and bony tenderness present. Decreased range of motion.     Left knee: No deformity.  Skin:    General: Skin is warm and dry.     Findings: No rash.  Neurological:     General: No focal deficit present.     Mental Status: He is alert.     Cranial Nerves: No cranial nerve deficit (no facial droop, extraocular movements intact, no slurred speech).     Sensory: No sensory deficit.     Motor: No abnormal muscle tone or seizure activity.     Coordination: Coordination normal.  Psychiatric:        Mood and Affect: Mood normal.     ED Results / Procedures / Treatments   Labs (all labs ordered are listed, but only abnormal results are displayed) Labs Reviewed  CBC - Abnormal; Notable for the following components:      Result Value   RBC 3.90 (*)    MCV 101.8 (*)    MCH 34.6 (*)    All other components within normal limits  BASIC METABOLIC PANEL - Abnormal; Notable for the following components:   Glucose, Bld 153 (*)    All other components within normal limits    EKG None  Radiology DG Knee 2 Views Left  Result Date: 07/10/2022 CLINICAL DATA:  Trauma, fall EXAM: LEFT KNEE - 1-2 VIEW COMPARISON:  None Available. FINDINGS: No recent fracture or dislocation is seen. There is no significant effusion in suprapatellar bursa. Degenerative changes are noted with small bony spurs and chondrocalcinosis. Arterial calcifications are seen in the soft tissues. IMPRESSION: No recent fracture or dislocation is seen in left knee. Degenerative changes are noted with small bony spurs and chondrocalcinosis. Electronically Signed   By: Elmer Picker M.D.   On: 07/10/2022 16:32   DG HIP UNILAT WITH PELVIS 2-3 VIEWS LEFT  Result Date: 07/10/2022 CLINICAL DATA:  Trauma, fall EXAM: DG HIP (WITH OR WITHOUT PELVIS) 2-3V LEFT  COMPARISON:  03/25/2022 FINDINGS: There is new deformity in the lateral aspect of intertrochanteric portion of neck of left femur there is no dislocation. Degenerative changes are noted with small bony spurs. Surgical clips are seen in pelvis. IMPRESSION: There is interval appearance of deformity in the intertrochanteric portion of left femur suggesting recent fracture. Electronically Signed   By: Royston Cowper  Rathinasamy M.D.   On: 07/10/2022 16:31   DG Shoulder Left  Result Date: 07/10/2022 CLINICAL DATA:  Trauma, fall EXAM: LEFT SHOULDER - 2+ VIEW COMPARISON:  None Available. FINDINGS: No fracture or dislocation is seen. No significant soft tissue abnormalities are seen. IMPRESSION: No fracture or dislocation is seen in left shoulder. Electronically Signed   By: Elmer Picker M.D.   On: 07/10/2022 16:27   DG Elbow Complete Left  Result Date: 07/10/2022 CLINICAL DATA:  Fall at onto left elbow. Left elbow pain and swelling. EXAM: LEFT ELBOW - COMPLETE 3+ VIEW COMPARISON:  None Available. FINDINGS: A comminuted, mildly displaced fracture is seen involving the proximal ulna and olecranon process with intra-articular extension. Other fractures are identified. No evidence of dislocation. Large elbow joint effusion and prominent soft tissue swelling noted. IMPRESSION: Comminuted, mildly displaced fracture of the proximal ulna and olecranon process, with intra-articular extension. Large elbow joint effusion. Electronically Signed   By: Marlaine Hind M.D.   On: 07/10/2022 16:24    Procedures Procedures    Medications Ordered in ED Medications  morphine (PF) 4 MG/ML injection 4 mg (has no administration in time range)    ED Course/ Medical Decision Making/ A&P Clinical Course as of 07/10/22 1847  Sun Jul 10, 2022  1641 X-ray shows comminuted mildly displaced fracture of the proximal ulna and olecranon process, patient also appears to have a left intertrochanteric hip fracture [JK]  1745 Case  discussed with Dr. Lucia Gaskins orthopedics.  He will see the patient and recommends long-arm splint with some extension for the left elbow fracture.  Bedrest for the hip fracture.   [JK]  1746 No significant abnormalities CBC and metabolic panel [JK]  7741 Case discussed with Dr. Flossie Buffy regarding admission [JK]    Clinical Course User Index [JK] Dorie Rank, MD                           Medical Decision Making Problems Addressed: Closed fracture of olecranon process of left ulna, initial encounter: acute illness or injury that poses a threat to life or bodily functions Closed nondisplaced intertrochanteric fracture of left femur, initial encounter [facility]): acute illness or injury that poses a threat to life or bodily functions  Amount and/or Complexity of Data Reviewed Labs: ordered. Decision-making details documented in ED Course. Radiology: ordered and independent interpretation performed.  Risk Prescription drug management. Decision regarding hospitalization. Risk Details: Case discussed with orthopedics and hospitalist service   Patient presented to the ED for mechanical fall.  Patient is on anticoagulation but no head injury no headache.  No signs of active bleeding.  Last dose of Eliquis was this morning.  X-rays unfortunately show signs of a hip fracture as well as a elbow fracture.  We will plan on admission to the hospital for further treatment and evaluation.        Final Clinical Impression(s) / ED Diagnoses Final diagnoses:  Closed nondisplaced intertrochanteric fracture of left femur, initial encounter ([place])  Closed fracture of olecranon process of left ulna, initial encounter    Rx / DC Orders ED Discharge Orders     None         Dorie Rank, MD 07/10/22 1847

## 2022-07-10 NOTE — Assessment & Plan Note (Addendum)
-  Patient's lisinopril initially held during the hospitalization patient maintained on Norvasc.   -As blood pressure improved lisinopril was resumed and patient's blood pressure was controlled on home regimen of Norvasc and lisinopril.   -Outpatient follow-up.

## 2022-07-10 NOTE — ED Triage Notes (Signed)
Pt BIBA from home. Pt tripped on step- fell on left side. Hematoma on L elbow, rotation on L leg.   No head trauma, no LOC, pt is on blood thinners for Afib.  Aox4  BP: 170/80 Hr: 60 SPO2: 99 RR: 20

## 2022-07-10 NOTE — Progress Notes (Signed)
Orthopedic Tech Progress Note Patient Details:  ARRICK DUTTON [birth year] [id number]  Ortho Devices Type of Ortho Device: Post (long arm) splint Ortho Device/Splint Location: LUE Ortho Device/Splint Interventions: Ordered, Application, Adjustment   Post Interventions Patient Tolerated: Well Instructions Provided: Care of device, Poper ambulation with device  Dmarcus Decicco 07/10/2022, 7:14 PM

## 2022-07-10 NOTE — Consult Note (Signed)
Reason for Consult: Left hip and left elbow fracture Referring Physician: Dorie Rank, MD  Mike Day is an 76 y.o. male.  HPI: Mike Day was working in the garden today when he tripped and fell onto his left side and injured his left elbow and hip.  He had immediate pain and was unable to get up.  He presented to the ED where x-rays were taken he was found to have a left elbow and left hip fracture.  He was placed into a left upper extremity long-arm splint.  He was admitted to the medicine service and orthopedics was consulted for management of his fractures.  At present, patient reports pain is isolated to the left elbow and left hip.  It is reasonably controlled.  He states he has had several falls recently and had some preceding left hip pain prior to his fracture.  He is on Eliquis at baseline and this is currently being held.  Family is present throughout the encounter.  Past Medical History:  Diagnosis Date   Anemia    Early 20's   Atrial fibrillation ([place]) 10/07/2014   3-14 day cardiac monitor 03/2019:  AFib, Avg HR 69, no significant pauses, PVCs, wide complex runs (aberrancy vs NSVT - longest 32 beats).   Atrial flutter ([place]) 2008   Status post RFCA Dr Cristopher Peru 2008   Carotid artery plaque    Carotid US 8/21: Bilat ICA 1-39   Chronic atrial fibrillation ([place])    Eliquis   Closed nondisplaced fracture of left patella 01/20/2020   Dermatochalasis of both upper eyelids 07/01/2014   Dysrhythmia    a fib    Echocardiograms    Echo 12/14 - Severe basal septal hypertrophy, moderate LVH, EF 60-65%, moderate LAE, mild RAE // Echo 7/20: EF 60-65, severe asymmetric LVH, mild RVE, normal RV SF, moderate BAE, moderate MAC, moderate aortic valve calcification (noncoronary cusp), mild dilation of ascending aorta (41 mm)   Hx of adenomatous colonic polyps    Hyperlipidemia    Hypertension    Malignant neoplasm of prostate ([place]) 05/18/2012   Neuromuscular disorder ([place])    neuropathy feet    Patellar sleeve fracture of left knee 2021   Prostate cancer ([place]) 2006   Prostatectomy   Shingles 01/2019   left side of face/eye/neck/ear    SKIN CANCER, HX OF 03/11/2009   Moh's L hand for Squamous Cell;2 other squamous cells removed w/o Mohs 2013 Rolm Bookbinder MD  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    Past Surgical History:  Procedure Laterality Date   BLADDER SUSPENSION  2010   COLONOSCOPY W/ POLYPECTOMY  2003;2010;04/2014   Tubular adenoma: recall 5 yrs (after 04/2019)   EYE SURGERY Bilateral 2021,2022   HERNIA REPAIR  07/2008   Dr.Martin   INGUINAL HERNIA REPAIR Left 08/25/2020   Procedure: OPEN LEFT INGUINAL HERNIA REPAIR WITH MESH;  Surgeon: Johnathan Hausen, MD;  Location: [facility];  Service: General;  Laterality: Left;   MOHS SURGERY     left hand   MOHS SURGERY Left 12/2020   ear   PROSTATECTOMY  2006   Robotic for adenocarcinoma; Dr Lawerance Bach   RADIOFREQUENCY ABLATION  01/15/2007   for ectopic atrial foci   SEPTOPLASTY  Age 32   TRANSTHORACIC ECHOCARDIOGRAM  09/02/2013   Normal.  EF 60%.  LAE and RAE.   XI ROBOTIC ASSISTED INGUINAL HERNIA REPAIR WITH MESH Right 01/11/2021   Procedure: XI ROBOTIC ASSISTED  RIGHT INGUINAL HERNIA REPAIR WITH  MESH;  Surgeon: Johnathan Hausen, MD;  Location: [facility];  Service: General;  Laterality: Right;    Family History  Problem Relation Age of Onset   Lung disease Father        Black Lung   Heart disease Father        Congenital   Emphysema Father    COPD Father    Diabetes Mother    Stroke Mother 68   Hypertension Brother    Heart disease Brother    Hyperlipidemia Brother    Heart attack Brother 90       sudden death   Cancer Neg Hx    Colon cancer Neg Hx    Esophageal cancer Neg Hx    Rectal cancer Neg Hx    Stomach cancer Neg Hx     Social History:  reports that he quit smoking about 14 years ago. His smoking use included cigarettes. He has never used smokeless tobacco. He reports current alcohol use of about  7.0 standard drinks of alcohol per week. He reports that he does not use drugs.  Allergies:  Allergies  Allergen Reactions   Levofloxacin Rash   Neosporin [Neomycin-Bacitracin Zn-Polymyx] Other (See Comments)    Dry/scaly skin/blistering    Tape Other (See Comments)    ADHESIVE TAPE; BLISTERS,    Medications: I have reviewed the patient's current medications.  Results for orders placed or performed during the hospital encounter of 07/10/22 (from the past 48 hour(s))  CBC     Status: Abnormal   Collection Time: 07/10/22  4:18 PM  Result Value Ref Range   WBC 10.0 4.0 - 10.5 K/uL   RBC 3.90 (L) 4.22 - 5.81 MIL/uL   Hemoglobin 13.5 13.0 - 17.0 g/dL   HCT 39.7 39.0 - 52.0 %   MCV 101.8 (H) 80.0 - 100.0 fL   MCH 34.6 (H) 26.0 - 34.0 pg   MCHC 34.0 30.0 - 36.0 g/dL   RDW 12.1 11.5 - 15.5 %   Platelets 163 150 - 400 K/uL   nRBC 0.0 0.0 - 0.2 %    Comment: Performed at [facility], [place] [address], [place], [place] [address]  Basic metabolic panel     Status: Abnormal   Collection Time: 07/10/22  4:18 PM  Result Value Ref Range   Sodium 135 135 - 145 mmol/L   Potassium 4.3 3.5 - 5.1 mmol/L   Chloride 101 98 - 111 mmol/L   CO2 27 22 - 32 mmol/L   Glucose, Bld 153 (H) 70 - 99 mg/dL    Comment: Glucose reference range applies only to samples taken after fasting for at least 8 hours.   BUN 18 8 - 23 mg/dL   Creatinine, Ser 0.86 0.61 - 1.24 mg/dL   Calcium 9.1 8.9 - 10.3 mg/dL   GFR, Estimated >60 >60 mL/min    Comment: (NOTE) Calculated using the CKD-EPI Creatinine Equation (2021)    Anion gap 7 5 - 15    Comment: Performed at [facility], [place] [address], [place], [place] [address]    DG Knee 2 Views Left  Result Date: 07/10/2022 CLINICAL DATA:  Trauma, fall EXAM: LEFT KNEE - 1-2 VIEW COMPARISON:  None Available. FINDINGS: No recent fracture or dislocation is seen. There is no significant effusion in suprapatellar bursa. Degenerative  changes are noted with small bony spurs and chondrocalcinosis. Arterial calcifications are seen in the soft tissues. IMPRESSION: No recent fracture or dislocation is seen in left  knee. Degenerative changes are noted with small bony spurs and chondrocalcinosis. Electronically Signed   By: Elmer Picker M.D.   On: 07/10/2022 16:32   DG HIP UNILAT WITH PELVIS 2-3 VIEWS LEFT  Result Date: 07/10/2022 CLINICAL DATA:  Trauma, fall EXAM: DG HIP (WITH OR WITHOUT PELVIS) 2-3V LEFT COMPARISON:  03/25/2022 FINDINGS: There is new deformity in the lateral aspect of intertrochanteric portion of neck of left femur there is no dislocation. Degenerative changes are noted with small bony spurs. Surgical clips are seen in pelvis. IMPRESSION: There is interval appearance of deformity in the intertrochanteric portion of left femur suggesting recent fracture. Electronically Signed   By: Elmer Picker M.D.   On: 07/10/2022 16:31   DG Shoulder Left  Result Date: 07/10/2022 CLINICAL DATA:  Trauma, fall EXAM: LEFT SHOULDER - 2+ VIEW COMPARISON:  None Available. FINDINGS: No fracture or dislocation is seen. No significant soft tissue abnormalities are seen. IMPRESSION: No fracture or dislocation is seen in left shoulder. Electronically Signed   By: Elmer Picker M.D.   On: 07/10/2022 16:27   DG Elbow Complete Left  Result Date: 07/10/2022 CLINICAL DATA:  Fall at onto left elbow. Left elbow pain and swelling. EXAM: LEFT ELBOW - COMPLETE 3+ VIEW COMPARISON:  None Available. FINDINGS: A comminuted, mildly displaced fracture is seen involving the proximal ulna and olecranon process with intra-articular extension. Other fractures are identified. No evidence of dislocation. Large elbow joint effusion and prominent soft tissue swelling noted. IMPRESSION: Comminuted, mildly displaced fracture of the proximal ulna and olecranon process, with intra-articular extension. Large elbow joint effusion. Electronically Signed   By:  Marlaine Hind M.D.   On: 07/10/2022 16:24    Review of Systems  Constitutional:  Negative for chills and fever.  HENT:  Negative for drooling, facial swelling and nosebleeds.   Eyes:  Negative for redness and visual disturbance.  Respiratory:  Negative for choking, shortness of breath and wheezing.   Cardiovascular:  Negative for leg swelling.  Gastrointestinal:  Negative for abdominal distention.  Musculoskeletal:  Positive for arthralgias and joint swelling.  Neurological:  Negative for facial asymmetry and speech difficulty.  Psychiatric/Behavioral:  Negative for agitation, behavioral problems, confusion and decreased concentration.    Blood pressure (!) 176/96, pulse (!) 58, temperature 98 F (36.7 C), temperature source Oral, resp. rate 18, SpO2 99 %. Physical Exam Constitutional:      Appearance: Normal appearance. He is not toxic-appearing.  HENT:     Head: Normocephalic and atraumatic.     Nose: Nose normal.     Mouth/Throat:     Mouth: Mucous membranes are moist.  Eyes:     Extraocular Movements: Extraocular movements intact.  Pulmonary:     Effort: Pulmonary effort is normal. No respiratory distress.     Breath sounds: No wheezing.  Abdominal:     General: Abdomen is flat.  Musculoskeletal:     Comments: Examination left hip shows tenderness to palpation at the anterior and lateral aspect.  There is mild swelling.  No deformity or ecchymosis.  Overlying skin is benign.  Range of motion at the hip not assessed due to acute injury.  He has good range of motion and 5 out of 5 strength at the ankles and feet.  Calf soft nontender.  Warm and well-perfused distally.  Examination of the left elbow demonstrates well fitted long-arm splint in slight extension.  The splint was not removed.  No tenderness distal or proximal to this splint.  Exposed skin is  benign.  He has intact sensation and good motor function in the hand.  The hand is warm and well-perfused.  Neurological:      Mental Status: He is alert.     Assessment/Plan: 1.  Left hip fracture, possible subacute intertrochanteric fracture 2.  Displaced intra-articular fracture of the left proximal ulna and olecranon   I discussed the nature of his fractures with the patient and his family.  He understands that he will require surgical management for reduction of his fractures and to optimize outcome and function.  We will order a CT scan of the left hip without contrast to further evaluate the nature of his hip fracture. We will discuss timing and planning of surgical management of his fractures further with the orthopedic team tomorrow.   -N.p.o. past midnight -Hold baseline Eliquis in anticipation of surgery -Nonweightbearing left upper and lower extremity -Keep left upper extremity splint clean, dry and intact -Pain control as needed  Jamison Yuhasz J Martinique 07/10/2022, 8:45 PM

## 2022-07-10 NOTE — Progress Notes (Signed)
Spouse brought in patients home medication. Patient takes Amlodipine twice a day please change in MAR.

## 2022-07-10 NOTE — Assessment & Plan Note (Addendum)
-  Remained rate controlled during the hospitalization.   -His Eliquis was held preoperatively and subsequently resumed.  Once patient was cleared by orthopedics.

## 2022-07-10 NOTE — Assessment & Plan Note (Addendum)
Left comminuted, mildly displaced fracture of proximal ulna and olecranon. -in long arm splint.  -Patient seen by orthopedics and patient status post ORIF left elbow per orthopedics 07/14/2022.  -PT/OT. -Per orthopedics.

## 2022-07-10 NOTE — Assessment & Plan Note (Addendum)
-  Patient maintained on home regimen statin.  -Outpatient follow-up.

## 2022-07-10 NOTE — Assessment & Plan Note (Addendum)
Left intertrochanteric fx of left femur -Secondary to mechanical fall. -Eliquis resumed. -Patient seen in consultation by orthopedics and underwent femoral IM nail per Dr. Percell Miller 07/12/2022. -PT/ OT. -Likely needs SNF. -Pain management per orthopedics. -Per orthopedics.

## 2022-07-11 ENCOUNTER — Inpatient Hospital Stay (HOSPITAL_COMMUNITY): Payer: Medicare Other

## 2022-07-11 DIAGNOSIS — S72002A Fracture of unspecified part of neck of left femur, initial encounter for closed fracture: Secondary | ICD-10-CM | POA: Diagnosis not present

## 2022-07-11 MED ORDER — MORPHINE SULFATE (PF) 2 MG/ML IV SOLN: 2.0000 mg | INTRAVENOUS | Status: DC | PRN

## 2022-07-11 MED ORDER — SENNOSIDES-DOCUSATE SODIUM 8.6-50 MG PO TABS
1.0000 | ORAL_TABLET | Freq: Two times a day (BID) | ORAL | Status: DC
  Administered 2022-07-11 – 2022-07-18 (×14): 1 via ORAL
  Filled 2022-07-11 (×14): qty 1

## 2022-07-11 NOTE — Progress Notes (Signed)
     Mike Day is a 76 y.o. male   Orthopaedic diagnosis: Left intertrochanteric hip fracture Left displaced intra-articular ulna and olecranon fracture  Subjective: Patient resting comfortably in bed.  Pain easily controlled on current regimen. He understands he will require surgical intervention for both his hip and elbow fractures.  Awaiting further information in terms of surgery.  They are hoping to have the surgery for both her left elbow and hip concomitantly. Wife is present throughout the encounter.  No new concerns. Has not been out of bed.  Objectyive: Vitals:   07/11/22 1002 07/11/22 1339  BP: (!) 139/97 (!) 158/95  Pulse: 70 69  Resp:  18  Temp:  97.7 F (36.5 C)  SpO2:  93%     Exam: Awake and alert Respirations even and unlabored No acute distress  Examination left hip shows tenderness to palpation at the anterior and lateral aspect.  There is mild swelling.  No deformity or ecchymosis.  Overlying skin is benign.  Range of motion at the hip not assessed due to acute injury.  He has good range of motion and 5 out of 5 strength at the ankles and feet.  Calf soft nontender.  Warm and well-perfused distally.   Examination of the left elbow demonstrates well fitted long-arm splint in slight extension.  The splint was not removed.  No tenderness distal or proximal to this splint.  Exposed skin is benign.  He has intact sensation and good motor function in the hand.  The hand is warm and well-perfused.   Assessment/Plan: 1.  Left hip fracture, possible subacute intertrochanteric fracture 2.  Displaced intra-articular fracture of the left proximal ulna and olecranon    Patientand family understands that he will require surgical management for reduction of his fractures and to optimize outcome and function. I discussed that we are trying to communicate within the orthopedic team regarding an optimal time and surgeon to perform both his hip and elbow fracture  concomitantly.  He feels it is in his best interest to get this done at the same time and I agree. We will try to get this set up as soon as possible, pending surgeon in [place] availability.  The patient and family are understanding of this.    In the interim:   -N.p.o. past midnight -Hold baseline Eliquis in anticipation of surgery -Nonweightbearing left upper and lower extremity -Keep left upper extremity splint clean, dry and intact -Pain control as needed   Lord Lancour J. Martinique, PA-C

## 2022-07-11 NOTE — Hospital Course (Signed)
PMH of chronic A-fib on Eliquis, prostate cancer, SP prostatectomy, HTN, HLD, type II DM presented with a mechanical fall. Found to have left hip fracture and left elbow fracture with hematoma. Orthopedics were consulted.  Patient will require surgical management for both fractures.  Underwent intramedullary nail placement on 11/7 for hip fracture on the left.

## 2022-07-11 NOTE — Progress Notes (Signed)
[facility] Progress Note Patient: Mike Day GGY:[id number] DOB: 01/15/1946 DOA: 07/10/2022  DOS: the patient was seen and examined on 07/11/2022  Brief hospital course: PMH of chronic A-fib on Eliquis, prostate cancer, SP prostatectomy, HTN, HLD, type II DM presented with a mechanical fall. Found to have left hip fracture and left elbow fracture with hematoma. Orthopedics were consulted.  Patient will require surgical management for both fractures.  Orthopedics attempting to perform both surgeries at the same time. Assessment and Plan: Left intertrochanteric fracture comminuted . Left olecranon fracture. After a mechanical fall. Patient low risk for significant cardiac or pulm outcome perioperatively. No further work-up so far necessary. Orthopedic surgery currently arranging for surgery for both elbow and hip at the same time. Patient will be n.p.o. again after midnight. Eliquis dose was on 11/5 in the morning. Continue to hold. Pain control and weightbearing per orthopedics.  Permanent A-fib. Currently rate controlled. On Eliquis currently on hold.  HTN. Blood pressure stable. Continue amlodipine hold lisinopril.  HLD. Continue statin.   Subjective: No nausea no vomiting.  Pain well controlled with morphine.  No fever no chills.  Passing gas.  No abdominal pain.  Physical Exam: General: in mild distress;  Cardiovascular: S1 and S2 Present, no Murmur Respiratory: normal respiratory effort, Bilateral Air entry present, no Crackles, no wheezes Abdomen: Bowel Sound present, Non tender  Extremities: no edema Neurology: alert and oriented to time, place, and person   Data Reviewed: I have Reviewed nursing notes, Vitals, and Lab results. Since last encounter, pertinent lab results CBC and BMP   . I have ordered test including CBC and BMP  . I have discussed pt's care plan and test results with orthopedics  .   Disposition: Status is: Inpatient Remains inpatient  appropriate because: Need for surgery  SCDs Start: 07/10/22 2018   Family Communication: Wife at bedside Level of care: Telemetry discontinue telemetry.  Vitals:   07/11/22 0133 07/11/22 0531 07/11/22 1002 07/11/22 1339  BP: 126/88 133/80 (!) 139/97 (!) 158/95  Pulse: 73 69 70 69  Resp: '18 18  18  '$ Temp: 98 F (36.7 C) 97.7 F (36.5 C)  97.7 F (36.5 C)  TempSrc: Oral Oral  Oral  SpO2: 96% 97%  93%  Weight:      Height:         Author: Berle Mull, MD 07/11/2022 6:55 PM  Please look on [email or web address] to find out who is on call.

## 2022-07-12 ENCOUNTER — Inpatient Hospital Stay (HOSPITAL_COMMUNITY): Payer: Medicare Other | Admitting: Certified Registered Nurse Anesthetist

## 2022-07-12 ENCOUNTER — Inpatient Hospital Stay (HOSPITAL_COMMUNITY): Payer: Medicare Other

## 2022-07-12 ENCOUNTER — Other Ambulatory Visit: Payer: Self-pay

## 2022-07-12 ENCOUNTER — Encounter (HOSPITAL_COMMUNITY): Payer: Self-pay | Admitting: Family Medicine

## 2022-07-12 ENCOUNTER — Encounter (HOSPITAL_COMMUNITY)
Admission: EM | Disposition: A | Discharge status: Skilled Nursing Facility | Payer: Self-pay | Source: Home / Self Care | Attending: Internal Medicine

## 2022-07-12 DIAGNOSIS — D649 Anemia, unspecified: Secondary | ICD-10-CM

## 2022-07-12 DIAGNOSIS — Z87891 Personal history of nicotine dependence: Secondary | ICD-10-CM

## 2022-07-12 DIAGNOSIS — S72002A Fracture of unspecified part of neck of left femur, initial encounter for closed fracture: Secondary | ICD-10-CM | POA: Diagnosis not present

## 2022-07-12 DIAGNOSIS — I10 Essential (primary) hypertension: Secondary | ICD-10-CM

## 2022-07-12 DIAGNOSIS — I4891 Unspecified atrial fibrillation: Secondary | ICD-10-CM | POA: Diagnosis not present

## 2022-07-12 HISTORY — PX: FEMUR IM NAIL: SHX1597

## 2022-07-12 LAB — CBC WITH DIFFERENTIAL/PLATELET
Abs Immature Granulocytes: 0.03 10*3/uL (ref 0.00–0.07)
Basophils Absolute: 0 10*3/uL (ref 0.0–0.1)
Basophils Relative: 1 %
Eosinophils Absolute: 0.2 10*3/uL (ref 0.0–0.5)
Eosinophils Relative: 3 %
HCT: 35.6 % — ABNORMAL LOW (ref 39.0–52.0)
Hemoglobin: 12.1 g/dL — ABNORMAL LOW (ref 13.0–17.0)
Immature Granulocytes: 1 %
Lymphocytes Relative: 13 %
Lymphs Abs: 0.8 10*3/uL (ref 0.7–4.0)
MCH: 34.2 pg — ABNORMAL HIGH (ref 26.0–34.0)
MCHC: 34 g/dL (ref 30.0–36.0)
MCV: 100.6 fL — ABNORMAL HIGH (ref 80.0–100.0)
Monocytes Absolute: 0.7 10*3/uL (ref 0.1–1.0)
Monocytes Relative: 12 %
Neutro Abs: 4.5 10*3/uL (ref 1.7–7.7)
Neutrophils Relative %: 70 %
Platelets: 140 10*3/uL — ABNORMAL LOW (ref 150–400)
RBC: 3.54 MIL/uL — ABNORMAL LOW (ref 4.22–5.81)
RDW: 12 % (ref 11.5–15.5)
WBC: 6.3 10*3/uL (ref 4.0–10.5)
nRBC: 0 % (ref 0.0–0.2)

## 2022-07-12 LAB — COMPREHENSIVE METABOLIC PANEL
ALT: 10 U/L (ref 0–44)
AST: 13 U/L — ABNORMAL LOW (ref 15–41)
Albumin: 3.4 g/dL — ABNORMAL LOW (ref 3.5–5.0)
Alkaline Phosphatase: 47 U/L (ref 38–126)
Anion gap: 8 (ref 5–15)
BUN: 13 mg/dL (ref 8–23)
CO2: 28 mmol/L (ref 22–32)
Calcium: 8.7 mg/dL — ABNORMAL LOW (ref 8.9–10.3)
Chloride: 97 mmol/L — ABNORMAL LOW (ref 98–111)
Creatinine, Ser: 0.74 mg/dL (ref 0.61–1.24)
GFR, Estimated: >60 mL/min (ref >60)
Glucose, Bld: 124 mg/dL — ABNORMAL HIGH (ref 70–99)
Potassium: 4 mmol/L (ref 3.5–5.1)
Sodium: 133 mmol/L — ABNORMAL LOW (ref 135–145)
Total Bilirubin: 1.1 mg/dL (ref 0.3–1.2)
Total Protein: 6 g/dL — ABNORMAL LOW (ref 6.5–8.1)

## 2022-07-12 LAB — PROTIME-INR
INR: 1.2 (ref 0.8–1.2)
Prothrombin Time: 15.1 seconds (ref 11.4–15.2)

## 2022-07-12 LAB — CBC
HCT: 37.4 % — ABNORMAL LOW (ref 39.0–52.0)
Hemoglobin: 12.7 g/dL — ABNORMAL LOW (ref 13.0–17.0)
MCH: 34.7 pg — ABNORMAL HIGH (ref 26.0–34.0)
MCHC: 34 g/dL (ref 30.0–36.0)
MCV: 102.2 fL — ABNORMAL HIGH (ref 80.0–100.0)
Platelets: 136 10*3/uL — ABNORMAL LOW (ref 150–400)
RBC: 3.66 MIL/uL — ABNORMAL LOW (ref 4.22–5.81)
RDW: 12 % (ref 11.5–15.5)
WBC: 9.9 10*3/uL (ref 4.0–10.5)
nRBC: 0 % (ref 0.0–0.2)

## 2022-07-12 LAB — VITAMIN D 25 HYDROXY (VIT D DEFICIENCY, FRACTURES): Vit D, 25-Hydroxy: 35.67 ng/mL (ref 30–100)

## 2022-07-12 LAB — TYPE AND SCREEN
ABO/RH(D): A POS
Antibody Screen: NEGATIVE

## 2022-07-12 LAB — SURGICAL PCR SCREEN
MRSA, PCR: NEGATIVE
Staphylococcus aureus: NEGATIVE

## 2022-07-12 LAB — CREATININE, SERUM
Creatinine, Ser: 0.74 mg/dL (ref 0.61–1.24)
GFR, Estimated: >60 mL/min (ref >60)

## 2022-07-12 SURGERY — INSERTION, INTRAMEDULLARY ROD, FEMUR
Anesthesia: General | Site: Hip | Laterality: Left

## 2022-07-12 MED ORDER — PHENYLEPHRINE 80 MCG/ML (10ML) SYRINGE FOR IV PUSH (FOR BLOOD PRESSURE SUPPORT)
PREFILLED_SYRINGE | INTRAVENOUS | Status: AC
  Filled 2022-07-12: qty 10

## 2022-07-12 MED ORDER — SUGAMMADEX SODIUM 200 MG/2ML IV SOLN
INTRAVENOUS | Status: DC | PRN
  Administered 2022-07-12: 200 mg via INTRAVENOUS

## 2022-07-12 MED ORDER — 0.9 % SODIUM CHLORIDE (POUR BTL) OPTIME
TOPICAL | Status: DC | PRN
  Administered 2022-07-12: 1000 mL

## 2022-07-12 MED ORDER — EPHEDRINE SULFATE-NACL 50-0.9 MG/10ML-% IV SOSY
PREFILLED_SYRINGE | INTRAVENOUS | Status: DC | PRN
  Administered 2022-07-12: 7.5 mg via INTRAVENOUS

## 2022-07-12 MED ORDER — LACTATED RINGERS IV SOLN
INTRAVENOUS | Status: DC
  Administered 2022-07-12: 1000 mL via INTRAVENOUS

## 2022-07-12 MED ORDER — DOCUSATE SODIUM 100 MG PO CAPS
100.0000 mg | ORAL_CAPSULE | Freq: Two times a day (BID) | ORAL | Status: DC
  Administered 2022-07-12 – 2022-07-15 (×6): 100 mg via ORAL
  Filled 2022-07-12 (×6): qty 1

## 2022-07-12 MED ORDER — ROCURONIUM BROMIDE 10 MG/ML (PF) SYRINGE
PREFILLED_SYRINGE | INTRAVENOUS | Status: AC
  Filled 2022-07-12: qty 10

## 2022-07-12 MED ORDER — METHOCARBAMOL 500 MG IVPB - SIMPLE MED: 500.0000 mg | Freq: Four times a day (QID) | INTRAVENOUS | Status: DC | PRN

## 2022-07-12 MED ORDER — MUPIROCIN 2 % EX OINT
1.0000 | TOPICAL_OINTMENT | Freq: Two times a day (BID) | CUTANEOUS | Status: DC
  Administered 2022-07-12: 1 via NASAL
  Filled 2022-07-12: qty 22

## 2022-07-12 MED ORDER — DEXAMETHASONE SODIUM PHOSPHATE 10 MG/ML IJ SOLN
INTRAMUSCULAR | Status: AC
  Filled 2022-07-12: qty 1

## 2022-07-12 MED ORDER — FENTANYL CITRATE (PF) 100 MCG/2ML IJ SOLN
INTRAMUSCULAR | Status: AC
  Filled 2022-07-12: qty 2

## 2022-07-12 MED ORDER — MENTHOL 3 MG MT LOZG: 1.0000 | LOZENGE | OROMUCOSAL | Status: DC | PRN

## 2022-07-12 MED ORDER — ONDANSETRON HCL 4 MG/2ML IJ SOLN
INTRAMUSCULAR | Status: AC
  Filled 2022-07-12: qty 2

## 2022-07-12 MED ORDER — CEFAZOLIN SODIUM-DEXTROSE 2-4 GM/100ML-% IV SOLN
2.0000 g | INTRAVENOUS | Status: AC
  Administered 2022-07-12: 2 g via INTRAVENOUS
  Filled 2022-07-12: qty 100

## 2022-07-12 MED ORDER — ONDANSETRON HCL 4 MG PO TABS: 4.0000 mg | ORAL_TABLET | Freq: Four times a day (QID) | ORAL | Status: DC | PRN

## 2022-07-12 MED ORDER — CHLORHEXIDINE GLUCONATE 4 % EX LIQD: 60.0000 mL | Freq: Once | CUTANEOUS | Status: DC

## 2022-07-12 MED ORDER — ONDANSETRON HCL 4 MG/2ML IJ SOLN: 4.0000 mg | Freq: Once | INTRAMUSCULAR | Status: DC | PRN

## 2022-07-12 MED ORDER — LIDOCAINE 2% (20 MG/ML) 5 ML SYRINGE
INTRAMUSCULAR | Status: DC | PRN
  Administered 2022-07-12: 60 mg via INTRAVENOUS

## 2022-07-12 MED ORDER — PROPOFOL 10 MG/ML IV BOLUS
INTRAVENOUS | Status: AC
  Filled 2022-07-12: qty 20

## 2022-07-12 MED ORDER — ONDANSETRON HCL 4 MG/2ML IJ SOLN
INTRAMUSCULAR | Status: DC | PRN
  Administered 2022-07-12: 4 mg via INTRAVENOUS

## 2022-07-12 MED ORDER — FENTANYL CITRATE PF 50 MCG/ML IJ SOSY
PREFILLED_SYRINGE | INTRAMUSCULAR | Status: AC
  Filled 2022-07-12: qty 3

## 2022-07-12 MED ORDER — HYDROCODONE-ACETAMINOPHEN 5-325 MG PO TABS
1.0000 | ORAL_TABLET | ORAL | Status: DC | PRN
  Administered 2022-07-12 – 2022-07-16 (×13): 2 via ORAL
  Administered 2022-07-17: 1 via ORAL
  Administered 2022-07-17 – 2022-07-18 (×3): 2 via ORAL
  Filled 2022-07-12: qty 2
  Filled 2022-07-12: qty 1
  Filled 2022-07-12 (×4): qty 2
  Filled 2022-07-12: qty 1
  Filled 2022-07-12 (×8): qty 2
  Filled 2022-07-12: qty 1
  Filled 2022-07-12 (×4): qty 2

## 2022-07-12 MED ORDER — PHENOL 1.4 % MT LIQD: 1.0000 | OROMUCOSAL | Status: DC | PRN

## 2022-07-12 MED ORDER — ACETAMINOPHEN 500 MG PO TABS
1000.0000 mg | ORAL_TABLET | Freq: Once | ORAL | Status: AC
  Administered 2022-07-12: 1000 mg via ORAL
  Filled 2022-07-12: qty 2

## 2022-07-12 MED ORDER — HYDROCODONE-ACETAMINOPHEN 7.5-325 MG PO TABS
1.0000 | ORAL_TABLET | ORAL | Status: DC | PRN
  Administered 2022-07-12: 1 via ORAL
  Administered 2022-07-13 – 2022-07-14 (×2): 2 via ORAL
  Filled 2022-07-12 (×3): qty 2

## 2022-07-12 MED ORDER — CEFAZOLIN SODIUM-DEXTROSE 2-4 GM/100ML-% IV SOLN
2.0000 g | Freq: Four times a day (QID) | INTRAVENOUS | Status: AC
  Administered 2022-07-12 – 2022-07-13 (×2): 2 g via INTRAVENOUS
  Filled 2022-07-12 (×2): qty 100

## 2022-07-12 MED ORDER — ENOXAPARIN SODIUM 40 MG/0.4ML IJ SOSY
40.0000 mg | PREFILLED_SYRINGE | INTRAMUSCULAR | Status: DC
  Administered 2022-07-13: 40 mg via SUBCUTANEOUS
  Filled 2022-07-12: qty 0.4

## 2022-07-12 MED ORDER — DEXAMETHASONE SODIUM PHOSPHATE 4 MG/ML IJ SOLN
INTRAMUSCULAR | Status: DC | PRN
  Administered 2022-07-12: 8 mg via INTRAVENOUS

## 2022-07-12 MED ORDER — OXYCODONE HCL 5 MG/5ML PO SOLN: 5.0000 mg | Freq: Once | ORAL | Status: DC | PRN

## 2022-07-12 MED ORDER — OXYCODONE HCL 5 MG PO TABS: 5.0000 mg | ORAL_TABLET | Freq: Once | ORAL | Status: DC | PRN

## 2022-07-12 MED ORDER — TRANEXAMIC ACID-NACL 1000-0.7 MG/100ML-% IV SOLN
1000.0000 mg | Freq: Once | INTRAVENOUS | Status: AC
  Administered 2022-07-12: 1000 mg via INTRAVENOUS
  Filled 2022-07-12: qty 100

## 2022-07-12 MED ORDER — MORPHINE SULFATE (PF) 2 MG/ML IV SOLN
0.5000 mg | INTRAVENOUS | Status: DC | PRN
  Administered 2022-07-12 (×2): 1 mg via INTRAVENOUS
  Filled 2022-07-12 (×2): qty 1

## 2022-07-12 MED ORDER — PROPOFOL 10 MG/ML IV BOLUS
INTRAVENOUS | Status: DC | PRN
  Administered 2022-07-12: 130 mg via INTRAVENOUS

## 2022-07-12 MED ORDER — FENTANYL CITRATE (PF) 100 MCG/2ML IJ SOLN
INTRAMUSCULAR | Status: DC | PRN
  Administered 2022-07-12 (×2): 50 ug via INTRAVENOUS

## 2022-07-12 MED ORDER — ROCURONIUM BROMIDE 10 MG/ML (PF) SYRINGE
PREFILLED_SYRINGE | INTRAVENOUS | Status: DC | PRN
  Administered 2022-07-12: 50 mg via INTRAVENOUS

## 2022-07-12 MED ORDER — PROPOFOL 500 MG/50ML IV EMUL
INTRAVENOUS | Status: DC | PRN
  Administered 2022-07-12: 25 ug/kg/min via INTRAVENOUS

## 2022-07-12 MED ORDER — PHENYLEPHRINE 80 MCG/ML (10ML) SYRINGE FOR IV PUSH (FOR BLOOD PRESSURE SUPPORT)
PREFILLED_SYRINGE | INTRAVENOUS | Status: DC | PRN
  Administered 2022-07-12: 80 ug via INTRAVENOUS
  Administered 2022-07-12 (×2): 120 ug via INTRAVENOUS
  Administered 2022-07-12: 80 ug via INTRAVENOUS

## 2022-07-12 MED ORDER — ONDANSETRON HCL 4 MG/2ML IJ SOLN: 4.0000 mg | Freq: Four times a day (QID) | INTRAMUSCULAR | Status: DC | PRN

## 2022-07-12 MED ORDER — PROPOFOL 500 MG/50ML IV EMUL
INTRAVENOUS | Status: AC
  Filled 2022-07-12: qty 50

## 2022-07-12 MED ORDER — TRANEXAMIC ACID-NACL 1000-0.7 MG/100ML-% IV SOLN
1000.0000 mg | INTRAVENOUS | Status: AC
  Administered 2022-07-12: 1000 mg via INTRAVENOUS
  Filled 2022-07-12: qty 100

## 2022-07-12 MED ORDER — STERILE WATER FOR IRRIGATION IR SOLN
Status: DC | PRN
  Administered 2022-07-12: 2000 mL

## 2022-07-12 MED ORDER — EPHEDRINE 5 MG/ML INJ
INTRAVENOUS | Status: AC
  Filled 2022-07-12: qty 5

## 2022-07-12 MED ORDER — POVIDONE-IODINE 10 % EX SWAB
2.0000 | Freq: Once | CUTANEOUS | Status: AC
  Administered 2022-07-12: 2 via TOPICAL

## 2022-07-12 MED ORDER — METHOCARBAMOL 500 MG PO TABS
500.0000 mg | ORAL_TABLET | Freq: Four times a day (QID) | ORAL | Status: DC | PRN
  Administered 2022-07-15 – 2022-07-18 (×4): 500 mg via ORAL
  Filled 2022-07-12 (×5): qty 1

## 2022-07-12 MED ORDER — FENTANYL CITRATE PF 50 MCG/ML IJ SOSY
25.0000 ug | PREFILLED_SYRINGE | INTRAMUSCULAR | Status: DC | PRN
  Administered 2022-07-12 (×2): 50 ug via INTRAVENOUS

## 2022-07-12 SURGICAL SUPPLY — 38 items
ADH SKN CLS APL DERMABOND .7 (GAUZE/BANDAGES/DRESSINGS) ×1
BAG COUNTER SPONGE SURGICOUNT (BAG) ×2 IMPLANT
BAG SPNG CNTER NS LX DISP (BAG) ×1
BIT DRILL 4.0X280 (BIT) IMPLANT
CLSR STERI-STRIP ANTIMIC 1/2X4 (GAUZE/BANDAGES/DRESSINGS) ×2 IMPLANT
COVER MAYO STAND STRL (DRAPES) ×2 IMPLANT
COVER PERINEAL POST (MISCELLANEOUS) ×2 IMPLANT
COVER SURGICAL LIGHT HANDLE (MISCELLANEOUS) ×2 IMPLANT
DERMABOND ADVANCED .7 DNX12 (GAUZE/BANDAGES/DRESSINGS) IMPLANT
DRAPE STERI IOBAN 125X83 (DRAPES) ×2 IMPLANT
DRESSING MEPILEX FLEX 4X4 (GAUZE/BANDAGES/DRESSINGS) ×6 IMPLANT
DRSG MEPILEX FLEX 4X4 (GAUZE/BANDAGES/DRESSINGS) ×3
DURAPREP 26ML APPLICATOR (WOUND CARE) ×2 IMPLANT
ELECT REM PT RETURN 15FT ADLT (MISCELLANEOUS) ×2 IMPLANT
GLOVE BIO SURGEON STRL SZ7.5 (GLOVE) ×2 IMPLANT
GLOVE BIOGEL PI IND STRL 7.5 (GLOVE) ×2 IMPLANT
GLOVE BIOGEL PI IND STRL 8 (GLOVE) ×4 IMPLANT
GLOVE SURG POLYISO LF SZ7.5 (GLOVE) ×2 IMPLANT
GOWN STRL REUS W/ TWL LRG LVL3 (GOWN DISPOSABLE) ×4 IMPLANT
GOWN STRL REUS W/TWL LRG LVL3 (GOWN DISPOSABLE) ×2
KIT BASIN OR (CUSTOM PROCEDURE TRAY) ×2 IMPLANT
KIT TURNOVER KIT A (KITS) IMPLANT
MANIFOLD NEPTUNE II (INSTRUMENTS) ×2 IMPLANT
NAIL TROCH 10X20X125 (Nail) IMPLANT
NS IRRIG 1000ML POUR BTL (IV SOLUTION) ×2 IMPLANT
PACK GENERAL/GYN (CUSTOM PROCEDURE TRAY) ×2 IMPLANT
PAD ARMBOARD 7.5X6 YLW CONV (MISCELLANEOUS) ×4 IMPLANT
PIN GUIDE THRD AR 3.2X330 (PIN) IMPLANT
SCREW LAG 10.5X110 (Screw) IMPLANT
SCREW LOCK CORT 5.0X40 (Screw) IMPLANT
STRIP CLOSURE SKIN 1/2X4 (GAUZE/BANDAGES/DRESSINGS) ×2 IMPLANT
SUT MNCRL AB 4-0 PS2 18 (SUTURE) ×2 IMPLANT
SUT VIC AB 2-0 CT1 27 (SUTURE) ×1
SUT VIC AB 2-0 CT1 TAPERPNT 27 (SUTURE) ×2 IMPLANT
TOOL ACTIVATION (INSTRUMENTS) IMPLANT
TOWEL OR 17X26 10 PK STRL BLUE (TOWEL DISPOSABLE) ×2 IMPLANT
TOWEL OR NON WOVEN STRL DISP B (DISPOSABLE) ×2 IMPLANT
WATER STERILE IRR 1000ML POUR (IV SOLUTION) ×2 IMPLANT

## 2022-07-12 NOTE — Transfer of Care (Signed)
Immediate Anesthesia Transfer of Care Note  Patient: [place]  Procedure(s) Performed: INTRAMEDULLARY (IM) NAIL FEMORAL (Left: Hip)  Patient Location: PACU  Anesthesia Type:General  Level of Consciousness: drowsy and patient cooperative  Airway & Oxygen Therapy: Patient Spontanous Breathing and Patient connected to face mask  Post-op Assessment: Report given to RN and Post -op Vital signs reviewed and stable  Post vital signs: Reviewed and stable  Last Vitals:  Vitals Value Taken Time  BP 150/79 07/12/22 1345  Temp    Pulse 78 07/12/22 1347  Resp 19 07/12/22 1347  SpO2 99 % 07/12/22 1347  Vitals shown include unvalidated device data.  Last Pain:  Vitals:   07/12/22 1117  TempSrc: Oral  PainSc: 2       Patients Stated Pain Goal: 3 (61/22/44 9753)  Complications: No notable events documented.

## 2022-07-12 NOTE — Interval H&P Note (Signed)
History and Physical Interval Note:  07/12/2022 11:22 AM  [place] Day  has presented today for surgery, with the diagnosis of LEFT HIP FRACTURE.  The various methods of treatment have been discussed with the patient and family. After consideration of risks, benefits and other options for treatment, the patient has consented to  Procedure(s): INTRAMEDULLARY (IM) NAIL FEMORAL (Left) as a surgical intervention.  The patient's history has been reviewed, patient examined, no change in status, stable for surgery.  I have reviewed the patient's chart and labs.  Questions were answered to the patient's satisfaction.     [name]

## 2022-07-12 NOTE — Consult Note (Signed)
ORTHOPAEDIC CONSULTATION  REQUESTING PHYSICIAN: Lavina Hamman, MD  Chief Complaint: Left hip and left elbow fracture   HPI: Mike Day is a 76 y.o. male who was working in the garden today when he tripped and fell onto his left side and injured his left elbow and hip.  He had immediate pain and was unable to get up.  He presented to the ED where x-rays were taken he was found to have a left elbow and left hip fracture. Orthopedics was consulted.   He is scheduled for surgery with Dr. Percell Miller today. He is sitting comfortably in his hospital bed. His wife is an orthopedic nurse. She is not currently at the bedside. He states his elbow hurt worse than his hip initially. Pain controlled with medication. He is right hand dominant at baseline. Normally ambulates without assistance.   Past Medical History:  Diagnosis Date   Anemia    Early 20's   Atrial fibrillation ([place]) 10/07/2014   3-14 day cardiac monitor 03/2019:  AFib, Avg HR 69, no significant pauses, PVCs, wide complex runs (aberrancy vs NSVT - longest 32 beats).   Atrial flutter ([place]) 2008   Status post RFCA Dr Cristopher Peru 2008   Carotid artery plaque    Carotid US 8/21: Bilat ICA 1-39   Chronic atrial fibrillation ([place])    Eliquis   Closed nondisplaced fracture of left patella 01/20/2020   Dermatochalasis of both upper eyelids 07/01/2014   Dysrhythmia    a fib    Echocardiograms    Echo 12/14 - Severe basal septal hypertrophy, moderate LVH, EF 60-65%, moderate LAE, mild RAE // Echo 7/20: EF 60-65, severe asymmetric LVH, mild RVE, normal RV SF, moderate BAE, moderate MAC, moderate aortic valve calcification (noncoronary cusp), mild dilation of ascending aorta (41 mm)   Hx of adenomatous colonic polyps    Hyperlipidemia    Hypertension    Malignant neoplasm of prostate ([place]) 05/18/2012   Neuromuscular disorder ([place])    neuropathy feet   Patellar sleeve fracture of left knee 2021   Prostate cancer ([place]) 2006    Prostatectomy   Shingles 01/2019   left side of face/eye/neck/ear    SKIN CANCER, HX OF 03/11/2009   Moh's L hand for Squamous Cell;2 other squamous cells removed w/o Mohs 2013 Rolm Bookbinder MD  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   Past Surgical History:  Procedure Laterality Date   BLADDER SUSPENSION  2010   COLONOSCOPY W/ POLYPECTOMY  2003;2010;04/2014   Tubular adenoma: recall 5 yrs (after 04/2019)   EYE SURGERY Bilateral 2021,2022   HERNIA REPAIR  07/2008   Dr.Martin   INGUINAL HERNIA REPAIR Left 08/25/2020   Procedure: OPEN LEFT INGUINAL HERNIA REPAIR WITH MESH;  Surgeon: Johnathan Hausen, MD;  Location: [facility];  Service: General;  Laterality: Left;   MOHS SURGERY     left hand   MOHS SURGERY Left 12/2020   ear   PROSTATECTOMY  2006   Robotic for adenocarcinoma; Dr Lawerance Bach   RADIOFREQUENCY ABLATION  01/15/2007   for ectopic atrial foci   SEPTOPLASTY  Age 67   TRANSTHORACIC ECHOCARDIOGRAM  09/02/2013   Normal.  EF 60%.  LAE and RAE.   XI ROBOTIC ASSISTED INGUINAL HERNIA REPAIR WITH MESH Right 01/11/2021   Procedure: XI ROBOTIC ASSISTED  RIGHT INGUINAL HERNIA REPAIR WITH MESH;  Surgeon: Johnathan Hausen, MD;  Location: [facility];  Service: General;  Laterality: Right;   Social History  Socioeconomic History   Marital status: Married    Spouse name: Not on file   Number of children: 1   Years of education: Not on file   Highest education level: Master's degree (e.g., MA, MS, MEng, MEd, MSW, MBA)  Occupational History   Occupation: retired  Tobacco Use   Smoking status: Former    Types: Cigarettes    Quit date: 09/06/2007    Years since quitting: 14.8   Smokeless tobacco: Never   Tobacco comments:    smoked 1963 -2009 . Cigars < 1 / day on average 1992-2009  Vaping Use   Vaping Use: Never used  Substance and Sexual Activity   Alcohol use: Yes    Alcohol/week: 7.0 standard drinks of alcohol    Types: 7 Glasses of wine per week    Comment: Socially   Drug use:  No   Sexual activity: Not Currently    Partners: Female  Other Topics Concern   Not on file  Social History Narrative   Married, 1 son, 2 grand-daughters.   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   Alcohol: 1 glass red wine per night.   Was once a long distance runner.   Social Determinants of Health   Financial Resource Strain: Low Risk  (01/29/2022)   Overall Financial Resource Strain (CARDIA)    Difficulty of Paying Living Expenses: Not hard at all  Food Insecurity: No Food Insecurity (07/10/2022)   Hunger Vital Sign    Worried About Running Out of Food in the Last Year: Never true    Ran Out of Food in the Last Year: Never true  Transportation Needs: No Transportation Needs (07/10/2022)   PRAPARE - Hydrologist (Medical): No    Lack of Transportation (Non-Medical): No  Physical Activity: Sufficiently Active (01/29/2022)   Exercise Vital Sign    Days of Exercise per Week: 5 days    Minutes of Exercise per Session: 100 min  Stress: Stress Concern Present (01/29/2022)   [place]    Feeling of Stress : To some extent  Social Connections: Socially Isolated (01/29/2022)   Social Connection and Isolation Panel [NHANES]    Frequency of Communication with Friends and Family: Once a week    Frequency of Social Gatherings with Friends and Family: Once a week    Attends Religious Services: Never    Marine scientist or Organizations: No    Attends Music therapist: Not on file    Marital Status: Married   Family History  Problem Relation Age of Onset   Lung disease Father        Black Lung   Heart disease Father        Congenital   Emphysema Father    COPD Father    Diabetes Mother    Stroke Mother 68   Hypertension Brother    Heart disease Brother    Hyperlipidemia Brother    Heart attack Brother 107       sudden death    Cancer Neg Hx    Colon cancer Neg Hx    Esophageal cancer Neg Hx    Rectal cancer Neg Hx    Stomach cancer Neg Hx    Allergies  Allergen Reactions   Levofloxacin Rash   Neosporin [Neomycin-Bacitracin Zn-Polymyx] Other (See Comments)    Dry/scaly skin/blistering    Tape Other (  See Comments)    ADHESIVE TAPE; BLISTERS,   Prior to Admission medications   Medication Sig Start Date End Date Taking? Authorizing Provider  acetaminophen (TYLENOL) 500 MG tablet Take 1,000 mg by mouth every 8 (eight) hours as needed for moderate pain.   Yes [provider]  alendronate (FOSAMAX) 70 MG tablet Take 1 tablet (70 mg total) by mouth every 7 (seven) days. Take with a full glass of water on an empty stomach. 02/03/22  Yes Kuneff, Renee A, DO  amLODipine (NORVASC) 2.5 MG tablet Take 1 tablet (2.5 mg total) by mouth daily. 12/17/21  Yes Sherren Mocha, MD  apixaban (ELIQUIS) 5 MG TABS tablet Take 1 tablet (5 mg total) by mouth 2 (two) times daily. 12/10/21  Yes Weaver, Scott T, PA-C  ARTIFICIAL TEAR SOLUTION OP Place 1 drop into both eyes daily as needed (dry eyes).   Yes [provider]  cholecalciferol (VITAMIN D3) 25 MCG (1000 UNIT) tablet Take 2 tablets (2,000 Units total) by mouth daily. 08/20/21  Yes Kuneff, Renee A, DO  Cyanocobalamin 1000 MCG SUBL 1000 mg sublingual daily. Patient taking differently: Take 1 tablet by mouth daily. 06/23/21  Yes Kuneff, Renee A, DO  Emollient (CETAPHIL) cream Apply 1 application topically daily.   Yes [provider]  lisinopril (ZESTRIL) 20 MG tablet Take 1 tablet (20 mg total) by mouth daily. 12/10/21  Yes Weaver, Scott T, PA-C  simvastatin (ZOCOR) 20 MG tablet Take 1 tablet (20 mg total) by mouth at bedtime. 12/10/21  Yes Weaver, Scott T, PA-C  triamcinolone (NASACORT) 55 MCG/ACT AERO nasal inhaler Place 1 spray into the nose daily as needed (allergies).   Yes [provider]  doxycycline (VIBRA-TABS) 100 MG tablet Take 1 tablet (100  mg total) by mouth 2 (two) times daily. Patient not taking: Reported on 07/10/2022 02/03/22   Howard Pouch A, DO  tizanidine (ZANAFLEX) 2 MG capsule Take 1 capsule (2 mg total) by mouth 3 (three) times daily. Patient not taking: Reported on 07/10/2022 03/09/22   Ma Hillock, DO   CT HIP LEFT WO CONTRAST  Result Date: 07/11/2022 CLINICAL DATA:  Status post fall.  Injured 07/10/2022. EXAM: CT OF THE LEFT HIP WITHOUT CONTRAST TECHNIQUE: Multidetector CT imaging of the left hip was performed according to the standard protocol. Multiplanar CT image reconstructions were also generated. RADIATION DOSE REDUCTION: This exam was performed according to the departmental dose-optimization program which includes automated exposure control, adjustment of the mA and/or kV according to patient size and/or use of iterative reconstruction technique. COMPARISON:  None Available. FINDINGS: Bones/Joint/Cartilage Generalized osteopenia. Comminuted left intertrochanteric fracture without significant displacement or angulation. No other fracture or dislocation.  No aggressive osseous lesion. 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 Mild osteoarthritis of the left hip. Normal alignment. No joint effusion. Ligaments Ligaments are suboptimally evaluated by CT. Muscles and Tendons Muscles are normal. No muscle atrophy. No intramuscular fluid collection or hematoma. Soft tissue No fluid collection or hematoma. No soft tissue mass. Peripheral vascular atherosclerotic disease. IMPRESSION: 1. Comminuted left intertrochanteric fracture without significant displacement or angulation. Electronically Signed   By: Kathreen Devoid M.D.   On: 07/11/2022 08:52   DG Knee 2 Views Left  Result Date: 07/10/2022 CLINICAL DATA:  Trauma, fall EXAM: LEFT KNEE - 1-2 VIEW COMPARISON:  None Available. FINDINGS: No recent fracture or dislocation is seen. There is no significant effusion in suprapatellar bursa. Degenerative changes are noted with small bony  spurs and chondrocalcinosis. Arterial calcifications are  seen in the soft tissues. IMPRESSION: No recent fracture or dislocation is seen in left knee. Degenerative changes are noted with small bony spurs and chondrocalcinosis. Electronically Signed   By: Elmer Picker M.D.   On: 07/10/2022 16:32   DG HIP UNILAT WITH PELVIS 2-3 VIEWS LEFT  Result Date: 07/10/2022 CLINICAL DATA:  Trauma, fall EXAM: DG HIP (WITH OR WITHOUT PELVIS) 2-3V LEFT COMPARISON:  03/25/2022 FINDINGS: There is new deformity in the lateral aspect of intertrochanteric portion of neck of left femur there is no dislocation. Degenerative changes are noted with small bony spurs. Surgical clips are seen in pelvis. IMPRESSION: There is interval appearance of deformity in the intertrochanteric portion of left femur suggesting recent fracture. Electronically Signed   By: Elmer Picker M.D.   On: 07/10/2022 16:31   DG Shoulder Left  Result Date: 07/10/2022 CLINICAL DATA:  Trauma, fall EXAM: LEFT SHOULDER - 2+ VIEW COMPARISON:  None Available. FINDINGS: No fracture or dislocation is seen. No significant soft tissue abnormalities are seen. IMPRESSION: No fracture or dislocation is seen in left shoulder. Electronically Signed   By: Elmer Picker M.D.   On: 07/10/2022 16:27   DG Elbow Complete Left  Result Date: 07/10/2022 CLINICAL DATA:  Fall at onto left elbow. Left elbow pain and swelling. EXAM: LEFT ELBOW - COMPLETE 3+ VIEW COMPARISON:  None Available. FINDINGS: A comminuted, mildly displaced fracture is seen involving the proximal ulna and olecranon process with intra-articular extension. Other fractures are identified. No evidence of dislocation. Large elbow joint effusion and prominent soft tissue swelling noted. IMPRESSION: Comminuted, mildly displaced fracture of the proximal ulna and olecranon process, with intra-articular extension. Large elbow joint effusion. Electronically Signed   By: Marlaine Hind M.D.   On:  07/10/2022 16:24   Family History Reviewed and non-contributory, no pertinent history of problems with bleeding or anesthesia      Review of Systems 14 system ROS conducted and negative except for that noted in HPI   OBJECTIVE  Vitals:Patient Vitals for the past 8 hrs:  BP Temp Temp src Pulse Resp SpO2  07/12/22 0929 137/75 (!) 97.4 F (36.3 C) Oral 66 16 99 %  07/12/22 0654 (!) 131/92 98.6 F (37 C) Oral 60 15 96 %   General: Alert, no acute distress Cardiovascular: Warm extremities noted Respiratory: No cyanosis, no use of accessory musculature GI: No organomegaly, abdomen is soft and non-tender Skin: No lesions in the area of chief complaint other than those listed below in MSK exam.  Neurologic: Sensation intact distally save for the below mentioned MSK exam Psychiatric: Patient is competent for consent with normal mood and affect Lymphatic: No swelling obvious and reported other than the area involved in the exam below  Extremities  DEY:CXKGYJEH moves right upper extremity without pain. NVI UDJ:SHFWYO CDI. Skin intact though cannot assess fully beneath splint.  + Motor in  AIN, PIN, Ulnar distributions. Sensation intact in medial, radial, and ulnar distributions. Well perfused digits.  RLE: actively moves right lower extremity without pain. NVI LLE: Shortened and externally rotated.  ROM deferred. + GS/TA/EHL. Sensation intact in DP/SP/S/S/P distributions. 2+ DP pulse with warm and well perfused digits. Compartments soft and compressible, with no pain on passive stretch.    Test Results Imaging Xrays of the left hip demonstrate a intertrochanteric femur fracture. Xrays of the left elbow demonstrate displaced intra-articular fracture of the left proximal ulna and olecranon    Labs cbc Recent Labs    07/10/22 1618 07/12/22 0805  WBC 10.0 6.3  HGB 13.5 12.1*  HCT 39.7 35.6*  PLT 163 140*    Labs inflam No results for input(s): "CRP" in the last 72 hours.  Invalid  input(s): "ESR"  Labs coag Recent Labs    07/12/22 0805  INR 1.2    Recent Labs    07/10/22 1618 07/12/22 0805  NA 135 133*  K 4.3 4.0  CL 101 97*  CO2 27 28  GLUCOSE 153* 124*  BUN 18 13  CREATININE 0.86 0.74  CALCIUM 9.1 8.7*     ASSESSMENT AND PLAN: 76 y.o. male with the following:   1.  Left hip fracture, possible subacute intertrochanteric fracture 2.  Displaced intra-articular fracture of the left proximal ulna and olecranon   This patient requires inpatient admission to manage this problem appropriately.  Discussed the nature of the injury as well as the care with the patient as well as the family.  Discussed options and non-operative versus operative measures. Nonoperative measures are not well tolerated as patient's on bedrest for extended periods of time tend to develop secondary issues such as pneumonia, urinary tract infections, bedsores and delirium.  Based on this our recommendation is for operative measures.  Understanding this the patient/family elected to proceed with operative measures.  The risks and benefits of  surgical intervention including infection, bleeding, nerve injury, periprosthetic fracture, the need for revision surgery, leg length discrepancy, gait change, blood clots, cardiopulmonary complications, morbidity, mortality, among others, and they were willing to proceed.     - Plan: Operative fixation today for left hip with Dr. Percell Miller. Plan for ORIF left elbow on Thursday.  - NPO -Medicine team to admit and perform pre-op clearance - Weight Bearing Status/Activity: will ammend WB status postop, bedrest for now - PT/OT post op - VTE Prophylaxis: SCDs for now - Pain control: PRN pain medications - Dispo: TBD  - Contact information: Dr. Fredonia Highland, Noemi Chapel, PA-C  [place], PA-C 07/12/2022 10:08 AM

## 2022-07-12 NOTE — Anesthesia Procedure Notes (Signed)
Procedure Name: Intubation Date/Time: 07/12/2022 12:30 PM  Performed by: Claudia Desanctis, CRNAPre-anesthesia Checklist: Patient identified, Emergency Drugs available, Suction available and Patient being monitored Patient Re-evaluated:Patient Re-evaluated prior to induction Oxygen Delivery Method: Circle system utilized Preoxygenation: Pre-oxygenation with 100% oxygen Induction Type: IV induction Ventilation: Mask ventilation without difficulty Laryngoscope Size: Miller and 3 Grade View: Grade I Tube type: Oral Tube size: 7.0 mm Number of attempts: 1 Airway Equipment and Method: Stylet Placement Confirmation: ETT inserted through vocal cords under direct vision, positive ETCO2 and breath sounds checked- equal and bilateral Secured at: 22 cm Tube secured with: Tape Dental Injury: Teeth and Oropharynx as per pre-operative assessment

## 2022-07-12 NOTE — Anesthesia Preprocedure Evaluation (Addendum)
Anesthesia Evaluation  Patient identified by MRN, date of birth, ID band Patient awake    Reviewed: Allergy & Precautions, NPO status , Patient's Chart, lab work & pertinent test results  History of Anesthesia Complications Negative for: history of anesthetic complications  Airway Mallampati: II  TM Distance: >3 FB Neck ROM: Full    Dental  (+) Dental Advisory Given   Pulmonary former smoker   Pulmonary exam normal        Cardiovascular hypertension, Pt. on medications Normal cardiovascular exam+ dysrhythmias Atrial Fibrillation    '20 TTE - EF 60-65%. There is severe asymmetric left ventricular hypertrophy. RV cavity was mildly enlarged. Left atrial size was moderately dilated. Right atrial size was moderately dilated. 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      Neuro/Psych negative neurological ROS  negative psych ROS   GI/Hepatic negative GI ROS, Neg liver ROS,,,  Endo/Other   Na 133 Pre-DM   Renal/GU negative Renal ROS    Prostate cancer     Musculoskeletal negative musculoskeletal ROS (+)    Abdominal   Peds  Hematology  (+) Blood dyscrasia, anemia  Plt 140k On eliquis, last dose 11/5    Anesthesia Other Findings   Reproductive/Obstetrics                             Anesthesia Physical Anesthesia Plan  ASA: 3  Anesthesia Plan: General   Post-op Pain Management: Tylenol PO (pre-op)*   Induction: Intravenous  PONV Risk Score and Plan: 2 and Treatment may vary due to age or medical condition, Ondansetron and Propofol infusion  Airway Management Planned: Oral ETT  Additional Equipment: None  Intra-op Plan:   Post-operative Plan: Extubation in OR  Informed Consent: I have reviewed the patients History and Physical, chart, labs and discussed the procedure including the risks, benefits and alternatives for the proposed anesthesia with the  patient or authorized representative who has indicated his/her understanding and acceptance.     Dental advisory given  Plan Discussed with: CRNA and Anesthesiologist  Anesthesia Plan Comments:        Anesthesia Quick Evaluation

## 2022-07-12 NOTE — Progress Notes (Signed)
[facility] Progress Note Patient: Mike Day FIE:[id number] DOB: 06-04-1946 DOA: 07/10/2022  DOS: the patient was seen and examined on 07/12/2022  Brief hospital course: PMH of chronic A-fib on Eliquis, prostate cancer, SP prostatectomy, HTN, HLD, type II DM presented with a mechanical fall. Found to have left hip fracture and left elbow fracture with hematoma. Orthopedics were consulted.  Patient will require surgical management for both fractures.  Underwent intramedullary nail placement on 11/7 for hip fracture on the left. Assessment and Plan: Comminuted left intertrochanteric closed fracture. Orthopedic consulted. Fracture after mechanical fall. Underwent intramedullary nail placement on 11/7. Pain management and DVT prophylaxis and weightbearing status per orthopedics.  Left olecranon fracture. Management per orthopedics. Currently scheduled for surgery on 11/9.  Permanent A-fib. Currently rate controlled. Patient is on Eliquis which is currently on hold. Resumption per orthopedics.  Left elbow hematoma. At risk for further anemia and may require blood transfusion. Monitor for hemoglobin. Transfuse for hemoglobin less than 8 for surgical blood loss.  HTN. Blood pressure stable. Continue amlodipine.  Hold lisinopril.  HLD. Continue statin.   Subjective: Patient is aware that he will be undergoing surgery with Dr. Percell Miller.  No nausea no vomiting no fever no chills.  Physical Exam: General: in mild distress;  Cardiovascular: S1 and S2 Present, no Murmur Respiratory: no respiratory effort, Bilateral Air entry present, no Crackles, no wheezes Abdomen: Bowel Sound present, Non tender  Extremities: no edema Neurology: alert and oriented to time, place, and person   Data Reviewed: I have Reviewed nursing notes, Vitals, and Lab results. Since last encounter, pertinent lab results CBC and BMP   . I have ordered test including CBC and BMP  .   Disposition: Status  is: Inpatient Remains inpatient appropriate because: Need further surgery for olecranon and negative to monitor for postop recovery.  enoxaparin (LOVENOX) injection 40 mg Start: 07/13/22 0800 SCDs Start: 07/12/22 1623 SCDs Start: 07/10/22 2018   Family Communication: No one at bedside Level of care: Med-Surg  Vitals:   07/12/22 1545 07/12/22 1614 07/12/22 1807 07/12/22 2018  BP: (!) 150/97 (!) 155/85 108/60 117/77  Pulse: 82 82 85 (!) 103  Resp: '20  16 16  '$ Temp: 98 F (36.7 C) 97.6 F (36.4 C) (!) 97.3 F (36.3 C) 98.5 F (36.9 C)  TempSrc:  Oral  Oral  SpO2: 98% 100% 98% 100%  Weight:      Height:         Author: Berle Mull, MD 07/12/2022 8:52 PM  Please look on [email or web address] to find out who is on call.

## 2022-07-12 NOTE — H&P (View-Only) (Signed)
   ORTHOPAEDIC CONSULTATION  REQUESTING PHYSICIAN: Patel, Pranav M, MD  Chief Complaint: Left hip and left elbow fracture   HPI: Mike Day is a 76 y.o. male who was working in the garden today when he tripped and fell onto his left side and injured his left elbow and hip.  He had immediate pain and was unable to get up.  He presented to the ED where x-rays were taken he was found to have a left elbow and left hip fracture. Orthopedics was consulted.   He is scheduled for surgery with Dr. Murphy today. He is sitting comfortably in his hospital bed. His wife is an orthopedic nurse. She is not currently at the bedside. He states his elbow hurt worse than his hip initially. Pain controlled with medication. He is right hand dominant at baseline. Normally ambulates without assistance.   Past Medical History:  Diagnosis Date   Anemia    Early 20's   Atrial fibrillation (HCC) 10/07/2014   3-14 day cardiac monitor 03/2019:  AFib, Avg HR 69, no significant pauses, PVCs, wide complex runs (aberrancy vs NSVT - longest 32 beats).   Atrial flutter (HCC) 2008   Status post RFCA Dr Gregg Taylor 2008   Carotid artery plaque    Carotid US 8/21: Bilat ICA 1-39   Chronic atrial fibrillation (HCC)    Eliquis   Closed nondisplaced fracture of left patella 01/20/2020   Dermatochalasis of both upper eyelids 07/01/2014   Dysrhythmia    a fib    Echocardiograms    Echo 12/14 - Severe basal septal hypertrophy, moderate LVH, EF 60-65%, moderate LAE, mild RAE // Echo 7/20: EF 60-65, severe asymmetric LVH, mild RVE, normal RV SF, moderate BAE, moderate MAC, moderate aortic valve calcification (noncoronary cusp), mild dilation of ascending aorta (41 mm)   Hx of adenomatous colonic polyps    Hyperlipidemia    Hypertension    Malignant neoplasm of prostate (HCC) 05/18/2012   Neuromuscular disorder (HCC)    neuropathy feet   Patellar sleeve fracture of left knee 2021   Prostate cancer (HCC) 2006    Prostatectomy   Shingles 01/2019   left side of face/eye/neck/ear    SKIN CANCER, HX OF 03/11/2009   Moh's L hand for Squamous Cell;2 other squamous cells removed w/o Mohs 2013 Laura Lomax MD  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   Past Surgical History:  Procedure Laterality Date   BLADDER SUSPENSION  2010   COLONOSCOPY W/ POLYPECTOMY  2003;2010;04/2014   Tubular adenoma: recall 5 yrs (after 04/2019)   EYE SURGERY Bilateral 2021,2022   HERNIA REPAIR  07/2008   Dr.Martin   INGUINAL HERNIA REPAIR Left 08/25/2020   Procedure: OPEN LEFT INGUINAL HERNIA REPAIR WITH MESH;  Surgeon: Martin, Matthew, MD;  Location: [facility];  Service: General;  Laterality: Left;   MOHS SURGERY     left hand   MOHS SURGERY Left 12/2020   ear   PROSTATECTOMY  2006   Robotic for adenocarcinoma; Dr Ron Davis   RADIOFREQUENCY ABLATION  01/15/2007   for ectopic atrial foci   SEPTOPLASTY  Age 17   TRANSTHORACIC ECHOCARDIOGRAM  09/02/2013   Normal.  EF 60%.  LAE and RAE.   XI ROBOTIC ASSISTED INGUINAL HERNIA REPAIR WITH MESH Right 01/11/2021   Procedure: XI ROBOTIC ASSISTED  RIGHT INGUINAL HERNIA REPAIR WITH MESH;  Surgeon: Martin, Matthew, MD;  Location: [facility];  Service: General;  Laterality: Right;   Social History     Socioeconomic History   Marital status: Married    Spouse name: Not on file   Number of children: 1   Years of education: Not on file   Highest education level: Master's degree (e.g., MA, MS, MEng, MEd, MSW, MBA)  Occupational History   Occupation: retired  Tobacco Use   Smoking status: Former    Types: Cigarettes    Quit date: 09/06/2007    Years since quitting: 14.8   Smokeless tobacco: Never   Tobacco comments:    smoked 1963 -2009 . Cigars < 1 / day on average 1992-2009  Vaping Use   Vaping Use: Never used  Substance and Sexual Activity   Alcohol use: Yes    Alcohol/week: 7.0 standard drinks of alcohol    Types: 7 Glasses of wine per week    Comment: Socially   Drug use:  No   Sexual activity: Not Currently    Partners: Female  Other Topics Concern   Not on file  Social History Narrative   Married, 1 son, 2 grand-daughters.   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   Alcohol: 1 glass red wine per night.   Was once a long distance runner.   Social Determinants of Health   Financial Resource Strain: Low Risk  (01/29/2022)   Overall Financial Resource Strain (CARDIA)    Difficulty of Paying Living Expenses: Not hard at all  Food Insecurity: No Food Insecurity (07/10/2022)   Hunger Vital Sign    Worried About Running Out of Food in the Last Year: Never true    Ran Out of Food in the Last Year: Never true  Transportation Needs: No Transportation Needs (07/10/2022)   PRAPARE - Transportation    Lack of Transportation (Medical): No    Lack of Transportation (Non-Medical): No  Physical Activity: Sufficiently Active (01/29/2022)   Exercise Vital Sign    Days of Exercise per Week: 5 days    Minutes of Exercise per Session: 100 min  Stress: Stress Concern Present (01/29/2022)   [employer] - Occupational Stress Questionnaire    Feeling of Stress : To some extent  Social Connections: Socially Isolated (01/29/2022)   Social Connection and Isolation Panel [NHANES]    Frequency of Communication with Friends and Family: Once a week    Frequency of Social Gatherings with Friends and Family: Once a week    Attends Religious Services: Never    Active Member of Clubs or Organizations: No    Attends Club or Organization Meetings: Not on file    Marital Status: Married   Family History  Problem Relation Age of Onset   Lung disease Father        Black Lung   Heart disease Father        Congenital   Emphysema Father    COPD Father    Diabetes Mother    Stroke Mother 83   Hypertension Brother    Heart disease Brother    Hyperlipidemia Brother    Heart attack Brother 58       sudden death    Cancer Neg Hx    Colon cancer Neg Hx    Esophageal cancer Neg Hx    Rectal cancer Neg Hx    Stomach cancer Neg Hx    Allergies  Allergen Reactions   Levofloxacin Rash   Neosporin [Neomycin-Bacitracin Zn-Polymyx] Other (See Comments)    Dry/scaly skin/blistering    Tape Other (  See Comments)    ADHESIVE TAPE; BLISTERS,   Prior to Admission medications   Medication Sig Start Date End Date Taking? Authorizing Provider  acetaminophen (TYLENOL) 500 MG tablet Take 1,000 mg by mouth every 8 (eight) hours as needed for moderate pain.   Yes [provider]  alendronate (FOSAMAX) 70 MG tablet Take 1 tablet (70 mg total) by mouth every 7 (seven) days. Take with a full glass of water on an empty stomach. 02/03/22  Yes Kuneff, Renee A, DO  amLODipine (NORVASC) 2.5 MG tablet Take 1 tablet (2.5 mg total) by mouth daily. 12/17/21  Yes Cooper, Michael, MD  apixaban (ELIQUIS) 5 MG TABS tablet Take 1 tablet (5 mg total) by mouth 2 (two) times daily. 12/10/21  Yes Weaver, Scott T, PA-C  ARTIFICIAL TEAR SOLUTION OP Place 1 drop into both eyes daily as needed (dry eyes).   Yes [provider]  cholecalciferol (VITAMIN D3) 25 MCG (1000 UNIT) tablet Take 2 tablets (2,000 Units total) by mouth daily. 08/20/21  Yes Kuneff, Renee A, DO  Cyanocobalamin 1000 MCG SUBL 1000 mg sublingual daily. Patient taking differently: Take 1 tablet by mouth daily. 06/23/21  Yes Kuneff, Renee A, DO  Emollient (CETAPHIL) cream Apply 1 application topically daily.   Yes [provider]  lisinopril (ZESTRIL) 20 MG tablet Take 1 tablet (20 mg total) by mouth daily. 12/10/21  Yes Weaver, Scott T, PA-C  simvastatin (ZOCOR) 20 MG tablet Take 1 tablet (20 mg total) by mouth at bedtime. 12/10/21  Yes Weaver, Scott T, PA-C  triamcinolone (NASACORT) 55 MCG/ACT AERO nasal inhaler Place 1 spray into the nose daily as needed (allergies).   Yes [provider]  doxycycline (VIBRA-TABS) 100 MG tablet Take 1 tablet (100  mg total) by mouth 2 (two) times daily. Patient not taking: Reported on 07/10/2022 02/03/22   Kuneff, Renee A, DO  tizanidine (ZANAFLEX) 2 MG capsule Take 1 capsule (2 mg total) by mouth 3 (three) times daily. Patient not taking: Reported on 07/10/2022 03/09/22   Kuneff, Renee A, DO   CT HIP LEFT WO CONTRAST  Result Date: 07/11/2022 CLINICAL DATA:  Status post fall.  Injured 07/10/2022. EXAM: CT OF THE LEFT HIP WITHOUT CONTRAST TECHNIQUE: Multidetector CT imaging of the left hip was performed according to the standard protocol. Multiplanar CT image reconstructions were also generated. RADIATION DOSE REDUCTION: This exam was performed according to the departmental dose-optimization program which includes automated exposure control, adjustment of the mA and/or kV according to patient size and/or use of iterative reconstruction technique. COMPARISON:  None Available. FINDINGS: Bones/Joint/Cartilage Generalized osteopenia. Comminuted left intertrochanteric fracture without significant displacement or angulation. No other fracture or dislocation.  No aggressive osseous lesion. 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 Mild osteoarthritis of the left hip. Normal alignment. No joint effusion. Ligaments Ligaments are suboptimally evaluated by CT. Muscles and Tendons Muscles are normal. No muscle atrophy. No intramuscular fluid collection or hematoma. Soft tissue No fluid collection or hematoma. No soft tissue mass. Peripheral vascular atherosclerotic disease. IMPRESSION: 1. Comminuted left intertrochanteric fracture without significant displacement or angulation. Electronically Signed   By: Hetal  Patel M.D.   On: 07/11/2022 08:52   DG Knee 2 Views Left  Result Date: 07/10/2022 CLINICAL DATA:  Trauma, fall EXAM: LEFT KNEE - 1-2 VIEW COMPARISON:  None Available. FINDINGS: No recent fracture or dislocation is seen. There is no significant effusion in suprapatellar bursa. Degenerative changes are noted with small bony  spurs and chondrocalcinosis. Arterial calcifications are   seen in the soft tissues. IMPRESSION: No recent fracture or dislocation is seen in left knee. Degenerative changes are noted with small bony spurs and chondrocalcinosis. Electronically Signed   By: Palani  Rathinasamy M.D.   On: 07/10/2022 16:32   DG HIP UNILAT WITH PELVIS 2-3 VIEWS LEFT  Result Date: 07/10/2022 CLINICAL DATA:  Trauma, fall EXAM: DG HIP (WITH OR WITHOUT PELVIS) 2-3V LEFT COMPARISON:  03/25/2022 FINDINGS: There is new deformity in the lateral aspect of intertrochanteric portion of neck of left femur there is no dislocation. Degenerative changes are noted with small bony spurs. Surgical clips are seen in pelvis. IMPRESSION: There is interval appearance of deformity in the intertrochanteric portion of left femur suggesting recent fracture. Electronically Signed   By: Palani  Rathinasamy M.D.   On: 07/10/2022 16:31   DG Shoulder Left  Result Date: 07/10/2022 CLINICAL DATA:  Trauma, fall EXAM: LEFT SHOULDER - 2+ VIEW COMPARISON:  None Available. FINDINGS: No fracture or dislocation is seen. No significant soft tissue abnormalities are seen. IMPRESSION: No fracture or dislocation is seen in left shoulder. Electronically Signed   By: Palani  Rathinasamy M.D.   On: 07/10/2022 16:27   DG Elbow Complete Left  Result Date: 07/10/2022 CLINICAL DATA:  Fall at onto left elbow. Left elbow pain and swelling. EXAM: LEFT ELBOW - COMPLETE 3+ VIEW COMPARISON:  None Available. FINDINGS: A comminuted, mildly displaced fracture is seen involving the proximal ulna and olecranon process with intra-articular extension. Other fractures are identified. No evidence of dislocation. Large elbow joint effusion and prominent soft tissue swelling noted. IMPRESSION: Comminuted, mildly displaced fracture of the proximal ulna and olecranon process, with intra-articular extension. Large elbow joint effusion. Electronically Signed   By: John A Stahl M.D.   On:  07/10/2022 16:24   Family History Reviewed and non-contributory, no pertinent history of problems with bleeding or anesthesia      Review of Systems 14 system ROS conducted and negative except for that noted in HPI   OBJECTIVE  Vitals:Patient Vitals for the past 8 hrs:  BP Temp Temp src Pulse Resp SpO2  07/12/22 0929 137/75 (!) 97.4 F (36.3 C) Oral 66 16 99 %  07/12/22 0654 (!) 131/92 98.6 F (37 C) Oral 60 15 96 %   General: Alert, no acute distress Cardiovascular: Warm extremities noted Respiratory: No cyanosis, no use of accessory musculature GI: No organomegaly, abdomen is soft and non-tender Skin: No lesions in the area of chief complaint other than those listed below in MSK exam.  Neurologic: Sensation intact distally save for the below mentioned MSK exam Psychiatric: Patient is competent for consent with normal mood and affect Lymphatic: No swelling obvious and reported other than the area involved in the exam below  Extremities  RUE:actively moves right upper extremity without pain. NVI LUE:Splint CDI. Skin intact though cannot assess fully beneath splint.  + Motor in  AIN, PIN, Ulnar distributions. Sensation intact in medial, radial, and ulnar distributions. Well perfused digits.  RLE: actively moves right lower extremity without pain. NVI LLE: Shortened and externally rotated.  ROM deferred. + GS/TA/EHL. Sensation intact in DP/SP/S/S/P distributions. 2+ DP pulse with warm and well perfused digits. Compartments soft and compressible, with no pain on passive stretch.    Test Results Imaging Xrays of the left hip demonstrate a intertrochanteric femur fracture. Xrays of the left elbow demonstrate displaced intra-articular fracture of the left proximal ulna and olecranon    Labs cbc Recent Labs    07/10/22 1618 07/12/22 0805    WBC 10.0 6.3  HGB 13.5 12.1*  HCT 39.7 35.6*  PLT 163 140*    Labs inflam No results for input(s): "CRP" in the last 72 hours.  Invalid  input(s): "ESR"  Labs coag Recent Labs    07/12/22 0805  INR 1.2    Recent Labs    07/10/22 1618 07/12/22 0805  NA 135 133*  K 4.3 4.0  CL 101 97*  CO2 27 28  GLUCOSE 153* 124*  BUN 18 13  CREATININE 0.86 0.74  CALCIUM 9.1 8.7*     ASSESSMENT AND PLAN: 76 y.o. male with the following:   1.  Left hip fracture, possible subacute intertrochanteric fracture 2.  Displaced intra-articular fracture of the left proximal ulna and olecranon   This patient requires inpatient admission to manage this problem appropriately.  Discussed the nature of the injury as well as the care with the patient as well as the family.  Discussed options and non-operative versus operative measures. Nonoperative measures are not well tolerated as patient's on bedrest for extended periods of time tend to develop secondary issues such as pneumonia, urinary tract infections, bedsores and delirium.  Based on this our recommendation is for operative measures.  Understanding this the patient/family elected to proceed with operative measures.  The risks and benefits of  surgical intervention including infection, bleeding, nerve injury, periprosthetic fracture, the need for revision surgery, leg length discrepancy, gait change, blood clots, cardiopulmonary complications, morbidity, mortality, among others, and they were willing to proceed.     - Plan: Operative fixation today for left hip with Dr. Murphy. Plan for ORIF left elbow on Thursday.  - NPO -Medicine team to admit and perform pre-op clearance - Weight Bearing Status/Activity: will ammend WB status postop, bedrest for now - PT/OT post op - VTE Prophylaxis: SCDs for now - Pain control: PRN pain medications - Dispo: TBD  - Contact information: Dr. Tim Murphy, Mike Berninger, PA-C  Eira Alpert, PA-C 07/12/2022 10:08 AM  

## 2022-07-12 NOTE — Anesthesia Postprocedure Evaluation (Signed)
Anesthesia Post Note  Patient: Mike Day  Procedure(s) Performed: INTRAMEDULLARY (IM) NAIL FEMORAL (Left: Hip)     Patient location during evaluation: PACU Anesthesia Type: General Level of consciousness: awake and alert Pain management: pain level controlled Vital Signs Assessment: post-procedure vital signs reviewed and stable Respiratory status: spontaneous breathing, nonlabored ventilation and respiratory function stable Cardiovascular status: stable and blood pressure returned to baseline Anesthetic complications: no   No notable events documented.  Last Vitals:  Vitals:   07/12/22 1415 07/12/22 1430  BP: (!) 143/81 (!) 140/75  Pulse: 76 66  Resp: 15 16  Temp:    SpO2: 100% 99%                 Audry Pili

## 2022-07-12 NOTE — Progress Notes (Signed)
     Mike Day is a 76 y.o. male   Orthopaedic diagnosis: Left intertrochanteric hip fracture Left displaced intra-articular ulna and olecranon fracture  Subjective: Patient appears comfortable in bed.  Pain controlled.  He is scheduled for surgery today with Dr. Percell Miller and we discussed this.  He is looking forward to proceeding with surgery and recovering.  No new complaints.  Wife at bedside.  Objectyive: Vitals:   07/11/22 2115 07/12/22 0654  BP: 120/76 (!) 131/92  Pulse: 72 60  Resp: 18 15  Temp: 99.1 F (37.3 C) 98.6 F (37 C)  SpO2: 96% 96%     Exam: Awake and alert Respirations even and unlabored No acute distress   Examination left hip shows tenderness to palpation at the anterior and lateral aspect.  There is mild swelling.  No deformity or ecchymosis.  Overlying skin is benign.  Range of motion at the hip not assessed due to acute injury.  He has good range of motion and 5 out of 5 strength at the ankles and feet.  Calf soft nontender.  Warm and well-perfused distally.   Examination of the left elbow demonstrates well fitted long-arm splint in slight extension.  The splint was not removed.  No tenderness distal or proximal to this splint.  Exposed skin is benign.  He has intact sensation and good motor function in the hand.  The hand is warm and well-perfused.    Assessment/Plan: 1.  Left hip fracture, possible subacute intertrochanteric fracture 2.  Displaced intra-articular fracture of the left proximal ulna and olecranon    Patient is scheduled for surgery today with Dr. Percell Miller.  Defer further management through him.  This was discussed with the patient and family at bedside who voiced understanding.  He understands he may require 2 separate procedures for both his hip and elbow if this cannot be done concomitantly.   -Keep NPO  -Hold baseline Eliquis  -Nonweightbearing left upper and lower extremity -Keep left upper extremity splint clean, dry and  intact -Pain control as needed     Mike Day J. [place], PA-C

## 2022-07-13 ENCOUNTER — Encounter (HOSPITAL_COMMUNITY): Payer: Self-pay | Admitting: Orthopedic Surgery

## 2022-07-13 DIAGNOSIS — S72145A Nondisplaced intertrochanteric fracture of left femur, initial encounter for closed fracture: Secondary | ICD-10-CM

## 2022-07-13 DIAGNOSIS — I4821 Permanent atrial fibrillation: Secondary | ICD-10-CM | POA: Diagnosis not present

## 2022-07-13 DIAGNOSIS — S52022A Displaced fracture of olecranon process without intraarticular extension of left ulna, initial encounter for closed fracture: Secondary | ICD-10-CM

## 2022-07-13 DIAGNOSIS — D62 Acute posthemorrhagic anemia: Secondary | ICD-10-CM | POA: Insufficient documentation

## 2022-07-13 DIAGNOSIS — I10 Essential (primary) hypertension: Secondary | ICD-10-CM | POA: Diagnosis not present

## 2022-07-13 HISTORY — DX: Nondisplaced intertrochanteric fracture of left femur, initial encounter for closed fracture: S72.145A

## 2022-07-13 HISTORY — DX: Displaced fracture of olecranon process without intraarticular extension of left ulna, initial encounter for closed fracture: S52.022A

## 2022-07-13 LAB — FOLATE: Folate: 7 ng/mL (ref >5.9)

## 2022-07-13 LAB — CBC
HCT: 27.7 % — ABNORMAL LOW (ref 39.0–52.0)
Hemoglobin: 9.6 g/dL — ABNORMAL LOW (ref 13.0–17.0)
MCH: 34.3 pg — ABNORMAL HIGH (ref 26.0–34.0)
MCHC: 34.7 g/dL (ref 30.0–36.0)
MCV: 98.9 fL (ref 80.0–100.0)
Platelets: 154 10*3/uL (ref 150–400)
RBC: 2.8 MIL/uL — ABNORMAL LOW (ref 4.22–5.81)
RDW: 11.9 % (ref 11.5–15.5)
WBC: 9 10*3/uL (ref 4.0–10.5)
nRBC: 0 % (ref 0.0–0.2)

## 2022-07-13 LAB — BASIC METABOLIC PANEL
Anion gap: 8 (ref 5–15)
BUN: 14 mg/dL (ref 8–23)
CO2: 24 mmol/L (ref 22–32)
Calcium: 8.2 mg/dL — ABNORMAL LOW (ref 8.9–10.3)
Chloride: 98 mmol/L (ref 98–111)
Creatinine, Ser: 0.73 mg/dL (ref 0.61–1.24)
GFR, Estimated: >60 mL/min (ref >60)
Glucose, Bld: 186 mg/dL — ABNORMAL HIGH (ref 70–99)
Potassium: 4.5 mmol/L (ref 3.5–5.1)
Sodium: 130 mmol/L — ABNORMAL LOW (ref 135–145)

## 2022-07-13 LAB — VITAMIN B12: Vitamin B-12: 1180 pg/mL — ABNORMAL HIGH (ref 180–914)

## 2022-07-13 LAB — IRON AND TIBC
Iron: 38 ug/dL — ABNORMAL LOW (ref 45–182)
Saturation Ratios: 13 % — ABNORMAL LOW (ref 17.9–39.5)
TIBC: 297 ug/dL (ref 250–450)
UIBC: 259 ug/dL

## 2022-07-13 LAB — FERRITIN: Ferritin: 273 ng/mL (ref 24–336)

## 2022-07-13 NOTE — Evaluation (Signed)
Physical Therapy Evaluation Patient Details Name: Mike Day MRN: [id number] DOB: 08-15-1946 Today's Date: 07/13/2022  History of Present Illness  Pt is a 76 year old male admitted after fall and found to have left hip fracture and left elbow fracture with hematoma.  Pt s/p L IM nail 07/12/22 and pending left elbow ORIF 07/14/22. PMH of chronic A-fib on Eliquis, prostate cancer, SP prostatectomy, HTN, HLD, type II DM  Clinical Impression  Patient is s/p above surgery resulting in functional limitations due to the deficits listed below (see PT Problem List).  Patient will benefit from skilled PT to increase their independence and safety with mobility to allow discharge to the venue listed below.   Pt reports being very independent and active prior to fall.  Pt appears frustrated and disappointed with his current situation.  Pt making jokes most of the session until returning to bed after he realized he was having so much difficulty and required a lot of assist just to stand.  Pt pending elbow surgery tomorrow.  Pt will likely need SNF upon d/c.        Recommendations for follow up therapy are one component of a multi-disciplinary discharge planning process, led by the attending physician.  Recommendations may be updated based on patient status, additional functional criteria and insurance authorization.  Follow Up Recommendations Skilled nursing-short term rehab (<3 hours/day) Can patient physically be transported by private vehicle: No    Assistance Recommended at Discharge Frequent or constant Supervision/Assistance  Patient can return home with the following  Two people to help with walking and/or transfers;Two people to help with bathing/dressing/bathroom    Equipment Recommendations None recommended by PT  Recommendations for Other Services       Functional Status Assessment Patient has had a recent decline in their functional status and demonstrates the ability to make significant  improvements in function in a reasonable and predictable amount of time.     Precautions / Restrictions Precautions Precautions: Fall Restrictions Weight Bearing Restrictions: Yes LUE Weight Bearing: Non weight bearing LLE Weight Bearing: Weight bearing as tolerated      Mobility  Bed Mobility Overal bed mobility: Needs Assistance Bed Mobility: Supine to Sit, Sit to Supine     Supine to sit: Max assist, +2 for physical assistance Sit to supine: Max assist, +2 for physical assistance   General bed mobility comments: assist for left side of body and scooting to EOB; assist for controlling trunk and bringing LEs onto bed upon return to supine    Transfers Overall transfer level: Needs assistance   Transfers: Sit to/from Stand Sit to Stand: Max assist, +2 physical assistance, From elevated surface           General transfer comment: attempted with bed rail for Rt UE however pt unable to completely stand; utilized stedy equipment; pt still requiring max assist to stand however able to hold standing min/guard for approx 3 minutes; returned to bed for better positioning and comfort and bed placed in more of chair position    Ambulation/Gait                  Stairs            Wheelchair Mobility    Modified Rankin (Stroke Patients Only)       Balance Overall balance assessment: Needs assistance, History of Falls         Standing balance support: Single extremity supported, Reliant on assistive device for balance, During functional activity  Standing balance-Leahy Scale: Poor                               Pertinent Vitals/Pain Pain Assessment Pain Assessment: Faces Faces Pain Scale: Hurts little more Pain Location: left elbow and left hip Pain Descriptors / Indicators: Grimacing, Sore Pain Intervention(s): Repositioned, Monitored during session, Premedicated before session    Home Living Family/patient expects to be discharged to::  Private residence Living Arrangements: Spouse/significant other;Children   Type of Home: House Home Access: Stairs to enter Entrance Stairs-Rails: Chemical engineer of Steps: 2   Home Layout: One level Home Equipment: Wheelchair - manual;BSC/3in1      Prior Function Prior Level of Function : Independent/Modified Independent                     Journalist, newspaper        Extremity/Trunk Assessment   Upper Extremity Assessment Upper Extremity Assessment: LUE deficits/detail LUE Deficits / Details: elbow splinted and sling in place, maintained NWB    Lower Extremity Assessment Lower Extremity Assessment: LLE deficits/detail LLE Deficits / Details: requiring assist for mobility       Communication   Communication: No difficulties  Cognition Arousal/Alertness: Awake/alert Behavior During Therapy: WFL for tasks assessed/performed Overall Cognitive Status: Within Functional Limits for tasks assessed                                 General Comments: making jokes most of the time        General Comments      Exercises     Assessment/Plan    PT Assessment Patient needs continued PT services  PT Problem List Decreased strength;Decreased mobility;Decreased activity tolerance;Decreased balance;Decreased knowledge of use of DME;Pain;Decreased knowledge of precautions       PT Treatment Interventions Gait training;Functional mobility training;Therapeutic activities;Patient/family education;Balance training;Therapeutic exercise;DME instruction    PT Goals (Current goals can be found in the Care Plan section)  Acute Rehab PT Goals PT Goal Formulation: With patient Time For Goal Achievement: 07/27/22 Potential to Achieve Goals: Good    Frequency Min 3X/week     Co-evaluation               AM-PAC PT "6 Clicks" Mobility  Outcome Measure Help needed turning from your back to your side while in a flat bed without using  bedrails?: A Lot Help needed moving from lying on your back to sitting on the side of a flat bed without using bedrails?: Total Help needed moving to and from a bed to a chair (including a wheelchair)?: Total Help needed standing up from a chair using your arms (e.g., wheelchair or bedside chair)?: Total Help needed to walk in hospital room?: Total Help needed climbing 3-5 steps with a railing? : Total 6 Click Score: 7    End of Session Equipment Utilized During Treatment: Gait belt Activity Tolerance: Patient tolerated treatment well Patient left: in bed;with call bell/phone within reach;with bed alarm set   PT Visit Diagnosis: Other abnormalities of gait and mobility (R26.89)    Time: 1031-1100 PT Time Calculation (min) (ACUTE ONLY): 29 min   Charges:   PT Evaluation $PT Eval Low Complexity: 1 Low PT Treatments $Therapeutic Activity: 8-22 mins       Jannette Spanner PT, DPT Physical Therapist Acute Rehabilitation Services Preferred contact method: Secure Chat Weekend Pager Only: [phone] Office:  [place] 07/13/2022, 11:45 AM

## 2022-07-13 NOTE — H&P (View-Only) (Signed)
Subjective: 1 Day Post-Op s/p Procedure(s): INTRAMEDULLARY (IM) NAIL FEMORAL   Patient is alert, oriented. Reports pain as severe directly after surgery, but non under good control. Patient eating breakfast. No other complaints.   Objective:  PE: VITALS:   Vitals:   07/12/22 2018 07/13/22 0133 07/13/22 0542 07/13/22 0905  BP: 117/77 93/63 119/62 127/71  Pulse: (!) 103 79 62 79  Resp: '16 18 18 18  '$ Temp: 98.5 F (36.9 C) 98.3 F (36.8 C) 97.7 F (36.5 C) 97.8 F (36.6 C)  TempSrc: Oral Oral Oral   SpO2: 100% 100% 100% 99%  Weight:      Height:        ABD soft Sensation intact distally Intact pulses distally Dorsiflexion/Plantar flexion intact Incision: dressing C/D/I  LABS  Results for orders placed or performed during the hospital encounter of 07/10/22 (from the past 24 hour(s))  CBC     Status: Abnormal   Collection Time: 07/12/22  4:40 PM  Result Value Ref Range   WBC 9.9 4.0 - 10.5 K/uL   RBC 3.66 (L) 4.22 - 5.81 MIL/uL   Hemoglobin 12.7 (L) 13.0 - 17.0 g/dL   HCT 37.4 (L) 39.0 - 52.0 %   MCV 102.2 (H) 80.0 - 100.0 fL   MCH 34.7 (H) 26.0 - 34.0 pg   MCHC 34.0 30.0 - 36.0 g/dL   RDW 12.0 11.5 - 15.5 %   Platelets 136 (L) 150 - 400 K/uL   nRBC 0.0 0.0 - 0.2 %  Creatinine, serum     Status: None   Collection Time: 07/12/22  4:40 PM  Result Value Ref Range   Creatinine, Ser 0.74 0.61 - 1.24 mg/dL   GFR, Estimated >60 >60 mL/min  CBC     Status: Abnormal   Collection Time: 07/13/22  3:52 AM  Result Value Ref Range   WBC 9.0 4.0 - 10.5 K/uL   RBC 2.80 (L) 4.22 - 5.81 MIL/uL   Hemoglobin 9.6 (L) 13.0 - 17.0 g/dL   HCT 27.7 (L) 39.0 - 52.0 %   MCV 98.9 80.0 - 100.0 fL   MCH 34.3 (H) 26.0 - 34.0 pg   MCHC 34.7 30.0 - 36.0 g/dL   RDW 11.9 11.5 - 15.5 %   Platelets 154 150 - 400 K/uL   nRBC 0.0 0.0 - 0.2 %  Basic metabolic panel     Status: Abnormal   Collection Time: 07/13/22  3:52 AM  Result Value Ref Range   Sodium 130 (L) 135 - 145 mmol/L    Potassium 4.5 3.5 - 5.1 mmol/L   Chloride 98 98 - 111 mmol/L   CO2 24 22 - 32 mmol/L   Glucose, Bld 186 (H) 70 - 99 mg/dL   BUN 14 8 - 23 mg/dL   Creatinine, Ser 0.73 0.61 - 1.24 mg/dL   Calcium 8.2 (L) 8.9 - 10.3 mg/dL   GFR, Estimated >60 >60 mL/min   Anion gap 8 5 - 15    DG Hip Port Unilat With Pelvis 1V Left  Result Date: 07/12/2022 CLINICAL DATA:  Postop ORIF EXAM: DG HIP (WITH OR WITHOUT PELVIS) 1V PORT LEFT COMPARISON:  CT left hip 07/11/2022 FINDINGS: Intertrochanteric fracture has been fixed with a locking intramedullary rod and threaded screw extending into the femoral head. Fracture in good alignment. Hardware in satisfactory position. IMPRESSION: Satisfactory ORIF left intertrochanteric fracture. Electronically Signed   By: Franchot Gallo M.D.   On: 07/12/2022 14:58   DG HIP UNILAT  WITH PELVIS 2-3 VIEWS LEFT  Result Date: 07/12/2022 CLINICAL DATA:  Fracture fixation. EXAM: DG HIP (WITH OR WITHOUT PELVIS) 2-3V LEFT COMPARISON:  Preoperative imaging. FINDINGS: Six fluoroscopic spot views of the left hip obtained in the operating room. Intramedullary nail with trans trochanteric and distal locking screw fixation. The known proximal femur fracture is not well demonstrated on these fluoroscopic spot views. Fluoroscopy time 34 seconds. Dose 2.2847 mGy. IMPRESSION: Fluoroscopic spot views during ORIF of left proximal femur fracture. Electronically Signed   By: Keith Rake M.D.   On: 07/12/2022 14:06   DG C-Arm 1-60 Min-No Report  Result Date: 07/12/2022 Fluoroscopy was utilized by the requesting physician.  No radiographic interpretation.    Assessment/Plan: Left olecranon fracture - plan for surgical treatment tomorrow - keep splint in place - NPO after midnight - NWB LUE  Left hip fracture 1 Day Post-Op s/p Procedure(s): INTRAMEDULLARY (IM) NAIL FEMORAL - Acute blood loss anemia, Hbg 9.6. Ok to move forward with surgery. Will hold lovenox tomorrow in anticipation of  surgery  Weightbearing: WBAT LLE, up with therapy as able  Insicional and dressing care: Dressings left intact until follow-up VTE prophylaxis: lovenox while inpatient Pain control: continue current regimen  Contact information:   Merlene Pulling, PA-C Weekdays 8-5  After hours and holidays please check [email or web address] for group call information for [facility]  Ventura Bruns 07/13/2022, 9:15 AM

## 2022-07-13 NOTE — Assessment & Plan Note (Addendum)
-  Patient with noted left elbow hematoma. -Patient denies any overt bleeding. -Hemoglobin trending down and seems to be stabilizing currently at 8.3 today.  -Anemia panel consistent with anemia of chronic disease. -Follow H&H. -Transfusion threshold hemoglobin < 7.

## 2022-07-13 NOTE — Progress Notes (Signed)
Subjective: 1 Day Post-Op s/p Procedure(s): INTRAMEDULLARY (IM) NAIL FEMORAL   Patient is alert, oriented. Reports pain as severe directly after surgery, but non under good control. Patient eating breakfast. No other complaints.   Objective:  PE: VITALS:   Vitals:   07/12/22 2018 07/13/22 0133 07/13/22 0542 07/13/22 0905  BP: 117/77 93/63 119/62 127/71  Pulse: (!) 103 79 62 79  Resp: '16 18 18 18  '$ Temp: 98.5 F (36.9 C) 98.3 F (36.8 C) 97.7 F (36.5 C) 97.8 F (36.6 C)  TempSrc: Oral Oral Oral   SpO2: 100% 100% 100% 99%  Weight:      Height:        ABD soft Sensation intact distally Intact pulses distally Dorsiflexion/Plantar flexion intact Incision: dressing C/D/I  LABS  Results for orders placed or performed during the hospital encounter of 07/10/22 (from the past 24 hour(s))  CBC     Status: Abnormal   Collection Time: 07/12/22  4:40 PM  Result Value Ref Range   WBC 9.9 4.0 - 10.5 K/uL   RBC 3.66 (L) 4.22 - 5.81 MIL/uL   Hemoglobin 12.7 (L) 13.0 - 17.0 g/dL   HCT 37.4 (L) 39.0 - 52.0 %   MCV 102.2 (H) 80.0 - 100.0 fL   MCH 34.7 (H) 26.0 - 34.0 pg   MCHC 34.0 30.0 - 36.0 g/dL   RDW 12.0 11.5 - 15.5 %   Platelets 136 (L) 150 - 400 K/uL   nRBC 0.0 0.0 - 0.2 %  Creatinine, serum     Status: None   Collection Time: 07/12/22  4:40 PM  Result Value Ref Range   Creatinine, Ser 0.74 0.61 - 1.24 mg/dL   GFR, Estimated >60 >60 mL/min  CBC     Status: Abnormal   Collection Time: 07/13/22  3:52 AM  Result Value Ref Range   WBC 9.0 4.0 - 10.5 K/uL   RBC 2.80 (L) 4.22 - 5.81 MIL/uL   Hemoglobin 9.6 (L) 13.0 - 17.0 g/dL   HCT 27.7 (L) 39.0 - 52.0 %   MCV 98.9 80.0 - 100.0 fL   MCH 34.3 (H) 26.0 - 34.0 pg   MCHC 34.7 30.0 - 36.0 g/dL   RDW 11.9 11.5 - 15.5 %   Platelets 154 150 - 400 K/uL   nRBC 0.0 0.0 - 0.2 %  Basic metabolic panel     Status: Abnormal   Collection Time: 07/13/22  3:52 AM  Result Value Ref Range   Sodium 130 (L) 135 - 145 mmol/L    Potassium 4.5 3.5 - 5.1 mmol/L   Chloride 98 98 - 111 mmol/L   CO2 24 22 - 32 mmol/L   Glucose, Bld 186 (H) 70 - 99 mg/dL   BUN 14 8 - 23 mg/dL   Creatinine, Ser 0.73 0.61 - 1.24 mg/dL   Calcium 8.2 (L) 8.9 - 10.3 mg/dL   GFR, Estimated >60 >60 mL/min   Anion gap 8 5 - 15    DG Hip Port Unilat With Pelvis 1V Left  Result Date: 07/12/2022 CLINICAL DATA:  Postop ORIF EXAM: DG HIP (WITH OR WITHOUT PELVIS) 1V PORT LEFT COMPARISON:  CT left hip 07/11/2022 FINDINGS: Intertrochanteric fracture has been fixed with a locking intramedullary rod and threaded screw extending into the femoral head. Fracture in good alignment. Hardware in satisfactory position. IMPRESSION: Satisfactory ORIF left intertrochanteric fracture. Electronically Signed   By: Franchot Gallo M.D.   On: 07/12/2022 14:58   DG HIP UNILAT  WITH PELVIS 2-3 VIEWS LEFT  Result Date: 07/12/2022 CLINICAL DATA:  Fracture fixation. EXAM: DG HIP (WITH OR WITHOUT PELVIS) 2-3V LEFT COMPARISON:  Preoperative imaging. FINDINGS: Six fluoroscopic spot views of the left hip obtained in the operating room. Intramedullary nail with trans trochanteric and distal locking screw fixation. The known proximal femur fracture is not well demonstrated on these fluoroscopic spot views. Fluoroscopy time 34 seconds. Dose 2.2847 mGy. IMPRESSION: Fluoroscopic spot views during ORIF of left proximal femur fracture. Electronically Signed   By: Keith Rake M.D.   On: 07/12/2022 14:06   DG C-Arm 1-60 Min-No Report  Result Date: 07/12/2022 Fluoroscopy was utilized by the requesting physician.  No radiographic interpretation.    Assessment/Plan: Left olecranon fracture - plan for surgical treatment tomorrow - keep splint in place - NPO after midnight - NWB LUE  Left hip fracture 1 Day Post-Op s/p Procedure(s): INTRAMEDULLARY (IM) NAIL FEMORAL - Acute blood loss anemia, Hbg 9.6. Ok to move forward with surgery. Will hold lovenox tomorrow in anticipation of  surgery  Weightbearing: WBAT LLE, up with therapy as able  Insicional and dressing care: Dressings left intact until follow-up VTE prophylaxis: lovenox while inpatient Pain control: continue current regimen  Contact information:   Merlene Pulling, PA-C Weekdays 8-5  After hours and holidays please check [email or web address] for group call information for [facility]  Ventura Bruns 07/13/2022, 9:15 AM

## 2022-07-13 NOTE — Care Management Important Message (Signed)
Important Message  Patient Details IM Letter given to the Patient. Name: Mike Day MRN: [id number] Date of Birth: [birth year]   Medicare Important Message Given:  Yes     Kerin Salen 07/13/2022, 11:27 AM

## 2022-07-13 NOTE — Plan of Care (Signed)
  Problem: Education: Goal: Knowledge of General Education information will improve Description: Including pain rating scale, medication(s)/side effects and non-pharmacologic comfort measures Outcome: Progressing   Problem: Elimination: Goal: Will not experience complications related to urinary retention Outcome: Progressing   Problem: Safety: Goal: Ability to remain free from injury will improve Outcome: Progressing   Problem: Pain Management: Goal: Pain level will decrease Outcome: Progressing

## 2022-07-13 NOTE — TOC Initial Note (Signed)
Transition of Care [facility]) - Initial/Assessment Note    Patient Details  Name: Mike Day MRN: [id number] Date of Birth: [birth year]  Transition of [facility]) CM/SW Contact:    Lennart Pall, LCSW Phone Number: 07/13/2022, 4:07 PM  Clinical Narrative:                 Spoke with pt and wife today to introduce TOC/CSW role with dc planning needs.  Pt has had one of two surgeries with elbow sz planned for tomorrow.  Both aware therapy has recommended SNF and are agreeable.  Wife does note that she is a retired Therapist, sports (orthopedics) and is able to provide assistance to pt in the home as well if a preferred SNF is not able to be secured.  At this point, will await more therapy following next surgery and will start SNF seach if still indicated.  Expected Discharge Plan: [place] (vs. SNF) Barriers to Discharge: Continued Medical Work up   Patient Goals and CMS Choice Patient states their goals for this hospitalization and ongoing recovery are:: return home      Expected Discharge Plan and Services Expected Discharge Plan: [place] (vs. SNF) In-house Referral: Clinical Social Work   Post Acute Care Choice: Home Health, [place] Living arrangements for the past 2 months: Single Family Home                                      Prior Living Arrangements/Services Living arrangements for the past 2 months: Single Family Home Lives with:: Spouse Patient language and need for interpreter reviewed:: Yes Do you feel safe going back to the place where you live?: Yes      Need for Family Participation in Patient Care: Yes (Comment) Care giver support system in place?: Yes (comment)   Criminal Activity/Legal Involvement Pertinent to Current Situation/Hospitalization: No - Comment as needed  Activities of Daily Living Home Assistive Devices/Equipment: None ADL Screening (condition at time of admission) Patient's cognitive ability  adequate to safely complete daily activities?: Yes Is the patient deaf or have difficulty hearing?: No Does the patient have difficulty seeing, even when wearing glasses/contacts?: No Does the patient have difficulty concentrating, remembering, or making decisions?: No Patient able to express need for assistance with ADLs?: Yes Does the patient have difficulty dressing or bathing?: No Independently performs ADLs?: Yes (appropriate for developmental age) Does the patient have difficulty walking or climbing stairs?: No Weakness of Legs: None Weakness of Arms/Hands: None  Permission Sought/Granted Permission sought to share information with : Family Supports Permission granted to share information with : Yes, Verbal Permission Granted  Share Information with NAME: Vikrant Pryce     Permission granted to share info w Relationship: spouse  Permission granted to share info w Contact Information: [phone]  Emotional Assessment Appearance:: Appears stated age Attitude/Demeanor/Rapport: Gracious, Engaged Affect (typically observed): Accepting Orientation: : Oriented to Self, Oriented to Place, Oriented to  Time, Oriented to Situation Alcohol / Substance Use: Not Applicable Psych Involvement: No (comment)  Admission diagnosis:  Hip fracture ([place]) [S72.009A] Closed nondisplaced intertrochanteric fracture of left femur, initial encounter ([place]) [S72.145A] Closed fracture of olecranon process of left ulna, initial encounter [S52.022A] Patient Active Problem List   Diagnosis Date Noted   Hip fracture ([place]) 07/10/2022   Olecranon fracture, left, closed, initial encounter 07/10/2022   Night sweats 12/10/2021  Aortic atherosclerosis ([place]) 12/09/2021   Osteopenia 09/14/2021   Compression fracture of first lumbar vertebra with delayed healing 09/03/2021   Thoracic back pain 09/03/2021   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   Vitamin D deficiency 08/19/2021   B12 deficiency 08/19/2021    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   Post herpetic neuralgia 03/15/2019   Hyperkalemia 03/15/2019   Cerebral atrophy ([place]) 03/15/2019   Cerebral arterial disease 03/15/2019   Permanent atrial fibrillation ([place]) 10/07/2014   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   Prediabetes 03/11/2009   Essential hypertension 09/17/2008   Pure hypercholesterolemia 11/01/2007   PROSTATE CANCER, HX OF 01/18/2007   Tubular adenoma of colon 01/12/2007   PCP:  Ma Hillock, DO Pharmacy:   [id number] [place] [id number]: [phone]: [phone] [facility] [address] [address] [place] [facility]: [phone]: [phone]    Social Determinants of Health (SDOH) Interventions    Readmission Risk Interventions    07/13/2022    4:04 PM  Readmission Risk Prevention Plan  Post Dischage Appt Complete  Medication Screening Complete  Transportation Screening Complete

## 2022-07-13 NOTE — Op Note (Signed)
DATE OF SURGERY:  07/13/2022  TIME: 8:11 AM  PATIENT NAME:  Mike Day  AGE: 76 y.o.  PRE-OPERATIVE DIAGNOSIS:  LEFT HIP FRACTURE  POST-OPERATIVE DIAGNOSIS:  SAME  PROCEDURE:  INTRAMEDULLARY (IM) NAIL FEMORAL  SURGEON:  [name]  ASSISTANT:  Aggie Moats, PA-C, he was present and scrubbed throughout the case, critical for completion in a timely fashion, and for retraction, instrumentation, and closure.   OPERATIVE IMPLANTS: Arthrex hip nail  PREOPERATIVE INDICATIONS:  Mike Day is a 76 y.o. year old who fell and suffered a hip fracture. He was brought into the ER and then admitted and optimized and then elected for surgical intervention.    The risks benefits and alternatives were discussed with the patient including but not limited to the risks of nonoperative treatment, versus surgical intervention including infection, bleeding, nerve injury, malunion, nonunion, hardware prominence, hardware failure, need for hardware removal, blood clots, cardiopulmonary complications, morbidity, mortality, among others, and they were willing to proceed.    OPERATIVE PROCEDURE:  The patient was brought to the operating room and placed in the supine position. General anesthesia was administered. He was placed on the fracture table.  Closed reduction was performed under C-arm guidance. Time out was then performed after sterile prep and drape. He received preoperative antibiotics.  I first performed a closed manipulation of the fracture to reduce it on the OR table.  Incision was made proximal to the greater trochanter. A guidewire was placed in the appropriate position. Confirmation was made on AP and lateral views. The above-named nail was opened. I opened the proximal femur with a reamer. I then placed the nail by hand easily down. I did not need to ream the femur.  Once the nail was completely seated, I placed a guidepin into the femoral head into the center center position. I  measured the length, and then reamed the lateral cortex and up into the head. I then placed the lag screw. Slight compression was applied. Anatomic fixation achieved.  I then secured the proximal interlocking bolt, and took off a half a turn, and then removed the instruments, and took final C-arm pictures AP and lateral the entire length of the leg.  I then used perfect circles technique to place a distal interlock screw.   Anatomic reconstruction was achieved, and the wounds were irrigated copiously and closed with a layered closure. The patient was awakened and returned to PACU in stable and satisfactory condition. There no complications and the patient tolerated the procedure well.  He will be weightbearing as tolerated, and will be on chemical px  for a period of four weeks after discharge.   Edmonia Lynch, M.D.

## 2022-07-13 NOTE — Progress Notes (Signed)
PROGRESS NOTE    Mike Day  WUJ:[id number] DOB: May 25, 1946 DOA: 07/10/2022 PCP: Ma Hillock, DO    Chief Complaint  Patient presents with   Fall   Hip Pain    Brief Narrative:  PMH of chronic A-fib on Eliquis, prostate cancer, SP prostatectomy, HTN, HLD, type II DM presented with a mechanical fall. Found to have left hip fracture and left elbow fracture with hematoma. Orthopedics were consulted.  Patient will require surgical management for both fractures.  Underwent intramedullary nail placement on 11/7 for hip fracture on the left.    Assessment & Plan:  Principal Problem:   Hip fracture ([place]) Active Problems:   Pure hypercholesterolemia   Essential hypertension   Permanent atrial fibrillation (HCC)   Olecranon fracture, left, closed, initial encounter   Acute postoperative anemia due to expected blood loss    Assessment and Plan: * Hip fracture ([place]) Left intertrochanteric fx of left femur -Secondary to mechanical fall. -Patient's Eliquis on hold. -Patient seen in consultation by orthopedics and underwent femoral IM nail per Dr. Percell Miller 07/12/2022. -PT/ OT. -Pain management per orthopedics. -Per orthopedics.  Acute postoperative anemia due to expected blood loss -Patient with noted left elbow hematoma. -Hemoglobin currently at 9.6. -Check an anemia panel. -Follow H&H. -Seizure threshold hemoglobin < 7.   Olecranon fracture, left, closed, initial encounter Left comminuted, mildly displaced fracture of proximal ulna and olecranon. -in long arm splint.  -Patient seen by orthopedics and patient for surgical repair tomorrow 07/14/2022. -Orthopedics.  Permanent atrial fibrillation (HCC) -Currently rate controlled.   -Eliquis on hold last dose 11/5.   -Orthopedics to advise when Eliquis can be resumed.   Essential hypertension -Controlled on current regimen of Norvasc.    Pure hypercholesterolemia Statin. Outpatient follow-up.         DVT  prophylaxis: SCDs/postop DVT prophylaxis per orthopedics. Code Status: Full Family Communication: Updated patient.  No family at bedside. Disposition: TBD.  Likely SNF once medically stable and cleared by orthopedics.  Status is: Inpatient Remains inpatient appropriate because: Severity of illness.  Patient full surgery tomorrow 07/14/2022   Consultants:  Orthopedics: Dr.Adair 07/10/2022 Orthopedics: Dr. Percell Miller 07/12/2022  Procedures:  CT left hip 07/11/2022 Plain films of the left elbow 07/10/2022 Plain films of the left hip and pelvis 07/10/2022, 07/12/2022 Plain films of the left knee 07/10/2022 Plain films of the left shoulder 07/10/2022 IM nail femoral, left 07/12/2022-Per orthopedics/Dr. Percell Miller  Antimicrobials:  Anti-infectives (From admission, onward)    Start     Dose/Rate Route Frequency Ordered Stop   07/12/22 1830  ceFAZolin (ANCEF) IVPB 2g/100 mL premix        2 g 200 mL/hr over 30 Minutes Intravenous Every 6 hours 07/12/22 1622 07/13/22 0030   07/12/22 1130  ceFAZolin (ANCEF) IVPB 2g/100 mL premix        2 g 200 mL/hr over 30 Minutes Intravenous On call to O.R. 07/12/22 1116 07/12/22 1242         Subjective: Sitting up in bed.  Some complaints of left shoulder pain.  Still with some left hip pain but improving.  Awaiting surgery to be done tomorrow.  Denies any chest pain or shortness of breath.  Denies any overt bleeding.  Objective: Vitals:   07/13/22 0133 07/13/22 0542 07/13/22 0905 07/13/22 1333  BP: 93/63 119/62 127/71 139/63  Pulse: 79 62 79 69  Resp: '18 18 18 16  '$ Temp: 98.3 F (36.8 C) 97.7 F (36.5 C) 97.8 F (36.6 C) 98.9 F (37.2  C)  TempSrc: Oral Oral  Oral  SpO2: 100% 100% 99% 99%  Weight:      Height:        Intake/Output Summary (Last 24 hours) at 07/13/2022 1822 Last data filed at 07/13/2022 1600 Gross per 24 hour  Intake 2516.57 ml  Output 1300 ml  Net 1216.57 ml   Filed Weights   07/10/22 2055 07/12/22 1125  Weight: 85.6 kg 85.6 kg     Examination:  General exam: Appears calm and comfortable. Respiratory system: Clear to auscultation.  No wheezes, no crackles, no rhonchi.  Respiratory effort normal. Cardiovascular system: S1 & S2 heard, RRR. No JVD, murmurs, rubs, gallops or clicks. No pedal edema. Gastrointestinal system: Abdomen is nondistended, soft and nontender. No organomegaly or masses felt. Normal bowel sounds heard. Central nervous system: Alert and oriented. No focal neurological deficits. Extremities: LUE in splint and sling.  Left hip with postop dressing in place.  Skin: No rashes, lesions or ulcers Psychiatry: Judgement and insight appear normal. Mood & affect appropriate.     Data Reviewed:   CBC: Recent Labs  Lab 07/10/22 1618 07/12/22 0805 07/12/22 1640 07/13/22 0352  WBC 10.0 6.3 9.9 9.0  NEUTROABS  --  4.5  --   --   HGB 13.5 12.1* 12.7* 9.6*  HCT 39.7 35.6* 37.4* 27.7*  MCV 101.8* 100.6* 102.2* 98.9  PLT 163 140* 136* 401    Basic Metabolic Panel: Recent Labs  Lab 07/10/22 1618 07/12/22 0805 07/12/22 1640 07/13/22 0352  NA 135 133*  --  130*  K 4.3 4.0  --  4.5  CL 101 97*  --  98  CO2 27 28  --  24  GLUCOSE 153* 124*  --  186*  BUN 18 13  --  14  CREATININE 0.86 0.74 0.74 0.73  CALCIUM 9.1 8.7*  --  8.2*    GFR: Estimated Creatinine Clearance: 93.9 mL/min (by C-G formula based on SCr of 0.73 mg/dL).  Liver Function Tests: Recent Labs  Lab 07/12/22 0805  AST 13*  ALT 10  ALKPHOS 47  BILITOT 1.1  PROT 6.0*  ALBUMIN 3.4*    CBG: No results for input(s): "GLUCAP" in the last 168 hours.   Recent Results (from the past 240 hour(s))  Surgical PCR screen     Status: None   Collection Time: 07/12/22  6:45 AM   Specimen: Nasal Mucosa; Nasal Swab  Result Value Ref Range Status   MRSA, PCR NEGATIVE NEGATIVE Final   Staphylococcus aureus NEGATIVE NEGATIVE Final    Comment: (NOTE) The Xpert SA Assay (FDA approved for NASAL specimens in patients 55 years of age  and older), is one component of a comprehensive surveillance program. It is not intended to diagnose infection nor to guide or monitor treatment. Performed at [facility], [place] [address] [place], [place] [address]   Culture, blood (Routine X 2) w Reflex to ID Panel     Status: None (Preliminary result)   Collection Time: 07/13/22  3:52 AM   Specimen: BLOOD  Result Value Ref Range Status   Specimen Description   Final    BLOOD BLOOD RIGHT HAND Performed at [facility] [address], [place], [place] [address]    Special Requests   Final    BOTTLES DRAWN AEROBIC AND ANAEROBIC Blood Culture adequate volume Performed at [facility] [address], [place], [place] [address]    Culture   Final    NO  GROWTH < 12 HOURS Performed at [facility] [address] [place], [place] [address]    Report Status PENDING  Incomplete  Culture, blood (Routine X 2) w Reflex to ID Panel     Status: None (Preliminary result)   Collection Time: 07/13/22  3:58 AM   Specimen: BLOOD  Result Value Ref Range Status   Specimen Description   Final    BLOOD BLOOD RIGHT HAND Performed at [facility] [address], [place], [place] [address]    Special Requests   Final    BOTTLES DRAWN AEROBIC AND ANAEROBIC Blood Culture adequate volume Performed at [facility] [address] [place], [place] [address]    Culture   Final    NO GROWTH < 12 HOURS Performed at [facility] [address], [place], [place] [address]    Report Status PENDING  Incomplete         Radiology Studies: DG Hip Port Unilat With Pelvis 1V Left  Result Date: 07/12/2022 CLINICAL DATA:  Postop ORIF EXAM: DG HIP (WITH OR WITHOUT PELVIS) 1V PORT LEFT COMPARISON:  CT left hip 07/11/2022 FINDINGS: Intertrochanteric fracture has been fixed with a locking intramedullary rod and threaded screw extending into the femoral  head. Fracture in good alignment. Hardware in satisfactory position. IMPRESSION: Satisfactory ORIF left intertrochanteric fracture. Electronically Signed   By: Franchot Gallo M.D.   On: 07/12/2022 14:58   DG HIP UNILAT WITH PELVIS 2-3 VIEWS LEFT  Result Date: 07/12/2022 CLINICAL DATA:  Fracture fixation. EXAM: DG HIP (WITH OR WITHOUT PELVIS) 2-3V LEFT COMPARISON:  Preoperative imaging. FINDINGS: Six fluoroscopic spot views of the left hip obtained in the operating room. Intramedullary nail with trans trochanteric and distal locking screw fixation. The known proximal femur fracture is not well demonstrated on these fluoroscopic spot views. Fluoroscopy time 34 seconds. Dose 2.2847 mGy. IMPRESSION: Fluoroscopic spot views during ORIF of left proximal femur fracture. Electronically Signed   By: Keith Rake M.D.   On: 07/12/2022 14:06   DG C-Arm 1-60 Min-No Report  Result Date: 07/12/2022 Fluoroscopy was utilized by the requesting physician.  No radiographic interpretation.        Scheduled Meds:  amLODipine  2.5 mg Oral Daily   cholecalciferol  2,000 Units Oral Daily   docusate sodium  100 mg Oral BID   senna-docusate  1 tablet Oral BID   simvastatin  20 mg Oral QHS   Continuous Infusions:  lactated ringers 50 mL/hr at 07/13/22 0240   methocarbamol (ROBAXIN) IV       LOS: 3 days    Time spent: 35 minutes    Irine Seal, MD [facility]   To contact the attending provider between 7A-7P or the covering provider during after hours 7P-7A, please log into the web site [email or web address] and access using universal [place] password for that web site. If you do not have the password, please call the hospital operator.  07/13/2022, 6:22 PM

## 2022-07-14 ENCOUNTER — Encounter (HOSPITAL_COMMUNITY)
Admission: EM | Disposition: A | Discharge status: Skilled Nursing Facility | Payer: Self-pay | Source: Home / Self Care | Attending: Internal Medicine

## 2022-07-14 ENCOUNTER — Inpatient Hospital Stay (HOSPITAL_COMMUNITY): Payer: Medicare Other | Admitting: Certified Registered Nurse Anesthetist

## 2022-07-14 ENCOUNTER — Encounter (HOSPITAL_COMMUNITY): Payer: Self-pay | Admitting: Family Medicine

## 2022-07-14 DIAGNOSIS — S52022A Displaced fracture of olecranon process without intraarticular extension of left ulna, initial encounter for closed fracture: Secondary | ICD-10-CM | POA: Diagnosis not present

## 2022-07-14 DIAGNOSIS — S72145A Nondisplaced intertrochanteric fracture of left femur, initial encounter for closed fracture: Secondary | ICD-10-CM | POA: Diagnosis not present

## 2022-07-14 DIAGNOSIS — Z87891 Personal history of nicotine dependence: Secondary | ICD-10-CM

## 2022-07-14 DIAGNOSIS — S42402A Unspecified fracture of lower end of left humerus, initial encounter for closed fracture: Secondary | ICD-10-CM

## 2022-07-14 DIAGNOSIS — I4821 Permanent atrial fibrillation: Secondary | ICD-10-CM | POA: Diagnosis not present

## 2022-07-14 DIAGNOSIS — I10 Essential (primary) hypertension: Secondary | ICD-10-CM | POA: Diagnosis not present

## 2022-07-14 DIAGNOSIS — E785 Hyperlipidemia, unspecified: Secondary | ICD-10-CM

## 2022-07-14 HISTORY — PX: ORIF ELBOW FRACTURE: SHX5031

## 2022-07-14 LAB — CBC WITH DIFFERENTIAL/PLATELET
Abs Immature Granulocytes: 0.03 10*3/uL (ref 0.00–0.07)
Basophils Absolute: 0 10*3/uL (ref 0.0–0.1)
Basophils Relative: 0 %
Eosinophils Absolute: 0.1 10*3/uL (ref 0.0–0.5)
Eosinophils Relative: 2 %
HCT: 24.1 % — ABNORMAL LOW (ref 39.0–52.0)
Hemoglobin: 8.3 g/dL — ABNORMAL LOW (ref 13.0–17.0)
Immature Granulocytes: 0 %
Lymphocytes Relative: 14 %
Lymphs Abs: 0.9 10*3/uL (ref 0.7–4.0)
MCH: 34.3 pg — ABNORMAL HIGH (ref 26.0–34.0)
MCHC: 34.4 g/dL (ref 30.0–36.0)
MCV: 99.6 fL (ref 80.0–100.0)
Monocytes Absolute: 0.7 10*3/uL (ref 0.1–1.0)
Monocytes Relative: 10 %
Neutro Abs: 5.1 10*3/uL (ref 1.7–7.7)
Neutrophils Relative %: 74 %
Platelets: 135 10*3/uL — ABNORMAL LOW (ref 150–400)
RBC: 2.42 MIL/uL — ABNORMAL LOW (ref 4.22–5.81)
RDW: 12.2 % (ref 11.5–15.5)
WBC: 6.9 10*3/uL (ref 4.0–10.5)
nRBC: 0 % (ref 0.0–0.2)

## 2022-07-14 LAB — BASIC METABOLIC PANEL
Anion gap: 7 (ref 5–15)
BUN: 14 mg/dL (ref 8–23)
CO2: 28 mmol/L (ref 22–32)
Calcium: 8.3 mg/dL — ABNORMAL LOW (ref 8.9–10.3)
Chloride: 98 mmol/L (ref 98–111)
Creatinine, Ser: 0.64 mg/dL (ref 0.61–1.24)
GFR, Estimated: >60 mL/min (ref >60)
Glucose, Bld: 215 mg/dL — ABNORMAL HIGH (ref 70–99)
Potassium: 4.1 mmol/L (ref 3.5–5.1)
Sodium: 133 mmol/L — ABNORMAL LOW (ref 135–145)

## 2022-07-14 LAB — VITAMIN B12: Vitamin B-12: 868 pg/mL (ref 180–914)

## 2022-07-14 LAB — IRON AND TIBC
Iron: 63 ug/dL (ref 45–182)
Saturation Ratios: 21 % (ref 17.9–39.5)
TIBC: 307 ug/dL (ref 250–450)
UIBC: 244 ug/dL

## 2022-07-14 LAB — FOLATE: Folate: 6.2 ng/mL (ref >5.9)

## 2022-07-14 LAB — FERRITIN: Ferritin: 196 ng/mL (ref 24–336)

## 2022-07-14 SURGERY — OPEN REDUCTION INTERNAL FIXATION (ORIF) ELBOW/OLECRANON FRACTURE
Anesthesia: General | Site: Elbow | Laterality: Left

## 2022-07-14 MED ORDER — 0.9 % SODIUM CHLORIDE (POUR BTL) OPTIME
TOPICAL | Status: DC | PRN
  Administered 2022-07-14: 1000 mL

## 2022-07-14 MED ORDER — ACETAMINOPHEN 500 MG PO TABS: 1000.0000 mg | ORAL_TABLET | Freq: Once | ORAL | Status: DC

## 2022-07-14 MED ORDER — FENTANYL CITRATE PF 50 MCG/ML IJ SOSY: 50.0000 ug | PREFILLED_SYRINGE | INTRAMUSCULAR | Status: DC

## 2022-07-14 MED ORDER — FENTANYL CITRATE PF 50 MCG/ML IJ SOSY
25.0000 ug | PREFILLED_SYRINGE | INTRAMUSCULAR | Status: DC | PRN
  Administered 2022-07-14: 50 ug via INTRAVENOUS

## 2022-07-14 MED ORDER — PHENYLEPHRINE HCL-NACL 20-0.9 MG/250ML-% IV SOLN
INTRAVENOUS | Status: DC | PRN
  Administered 2022-07-14: 35 ug/min via INTRAVENOUS

## 2022-07-14 MED ORDER — PHENYLEPHRINE 80 MCG/ML (10ML) SYRINGE FOR IV PUSH (FOR BLOOD PRESSURE SUPPORT)
PREFILLED_SYRINGE | INTRAVENOUS | Status: DC | PRN
  Administered 2022-07-14 (×4): 160 ug via INTRAVENOUS

## 2022-07-14 MED ORDER — DEXAMETHASONE SODIUM PHOSPHATE 10 MG/ML IJ SOLN
INTRAMUSCULAR | Status: DC | PRN
  Administered 2022-07-14: 10 mg via INTRAVENOUS

## 2022-07-14 MED ORDER — DEXAMETHASONE SODIUM PHOSPHATE 10 MG/ML IJ SOLN
INTRAMUSCULAR | Status: AC
  Filled 2022-07-14: qty 1

## 2022-07-14 MED ORDER — FENTANYL CITRATE (PF) 100 MCG/2ML IJ SOLN
INTRAMUSCULAR | Status: DC | PRN
  Administered 2022-07-14: 100 ug via INTRAVENOUS

## 2022-07-14 MED ORDER — LIDOCAINE 2% (20 MG/ML) 5 ML SYRINGE
INTRAMUSCULAR | Status: DC | PRN
  Administered 2022-07-14: 60 mg via INTRAVENOUS

## 2022-07-14 MED ORDER — ROCURONIUM BROMIDE 10 MG/ML (PF) SYRINGE
PREFILLED_SYRINGE | INTRAVENOUS | Status: DC | PRN
  Administered 2022-07-14: 70 mg via INTRAVENOUS

## 2022-07-14 MED ORDER — ROCURONIUM BROMIDE 10 MG/ML (PF) SYRINGE
PREFILLED_SYRINGE | INTRAVENOUS | Status: AC
  Filled 2022-07-14: qty 10

## 2022-07-14 MED ORDER — PHENYLEPHRINE 80 MCG/ML (10ML) SYRINGE FOR IV PUSH (FOR BLOOD PRESSURE SUPPORT)
PREFILLED_SYRINGE | INTRAVENOUS | Status: AC
  Filled 2022-07-14: qty 10

## 2022-07-14 MED ORDER — FENTANYL CITRATE PF 50 MCG/ML IJ SOSY
PREFILLED_SYRINGE | INTRAMUSCULAR | Status: AC
  Administered 2022-07-14: 50 ug via INTRAVENOUS
  Filled 2022-07-14: qty 2

## 2022-07-14 MED ORDER — ONDANSETRON HCL 4 MG/2ML IJ SOLN
INTRAMUSCULAR | Status: DC | PRN
  Administered 2022-07-14: 4 mg via INTRAVENOUS

## 2022-07-14 MED ORDER — POVIDONE-IODINE 10 % EX SWAB: 2.0000 | Freq: Once | CUTANEOUS | Status: DC

## 2022-07-14 MED ORDER — FENTANYL CITRATE (PF) 100 MCG/2ML IJ SOLN
INTRAMUSCULAR | Status: AC
  Filled 2022-07-14: qty 2

## 2022-07-14 MED ORDER — BUPIVACAINE HCL 0.25 % IJ SOLN
INTRAMUSCULAR | Status: AC
  Filled 2022-07-14: qty 1

## 2022-07-14 MED ORDER — PROPOFOL 10 MG/ML IV BOLUS
INTRAVENOUS | Status: AC
  Filled 2022-07-14: qty 20

## 2022-07-14 MED ORDER — ONDANSETRON HCL 4 MG/2ML IJ SOLN: 4.0000 mg | Freq: Once | INTRAMUSCULAR | Status: DC | PRN

## 2022-07-14 MED ORDER — LIDOCAINE HCL (PF) 2 % IJ SOLN
INTRAMUSCULAR | Status: AC
  Filled 2022-07-14: qty 5

## 2022-07-14 MED ORDER — BUPIVACAINE LIPOSOME 1.3 % IJ SUSP
INTRAMUSCULAR | Status: DC | PRN
  Administered 2022-07-14: 10 mL via PERINEURAL

## 2022-07-14 MED ORDER — PROPOFOL 10 MG/ML IV BOLUS
INTRAVENOUS | Status: DC | PRN
  Administered 2022-07-14: 120 mg via INTRAVENOUS

## 2022-07-14 MED ORDER — CEFAZOLIN SODIUM-DEXTROSE 2-4 GM/100ML-% IV SOLN
2.0000 g | INTRAVENOUS | Status: AC
  Administered 2022-07-14: 2 g via INTRAVENOUS
  Filled 2022-07-14: qty 100

## 2022-07-14 MED ORDER — CHLORHEXIDINE GLUCONATE 4 % EX LIQD: 60.0000 mL | Freq: Once | CUTANEOUS | Status: DC

## 2022-07-14 MED ORDER — FENTANYL CITRATE PF 50 MCG/ML IJ SOSY
PREFILLED_SYRINGE | INTRAMUSCULAR | Status: AC
  Filled 2022-07-14: qty 1

## 2022-07-14 MED ORDER — BUPIVACAINE HCL (PF) 0.5 % IJ SOLN
INTRAMUSCULAR | Status: DC | PRN
  Administered 2022-07-14: 20 mL via PERINEURAL

## 2022-07-14 MED ORDER — OXYCODONE HCL 5 MG PO TABS: 5.0000 mg | ORAL_TABLET | Freq: Once | ORAL | Status: DC | PRN

## 2022-07-14 MED ORDER — OXYCODONE HCL 5 MG/5ML PO SOLN: 5.0000 mg | Freq: Once | ORAL | Status: DC | PRN

## 2022-07-14 MED ORDER — ONDANSETRON HCL 4 MG/2ML IJ SOLN
INTRAMUSCULAR | Status: AC
  Filled 2022-07-14: qty 2

## 2022-07-14 SURGICAL SUPPLY — 92 items
APL PRP STRL LF DISP 70% ISPRP (MISCELLANEOUS) ×2
BAG COUNTER SPONGE SURGICOUNT (BAG) ×2 IMPLANT
BAG SPNG CNTER NS LX DISP (BAG) ×1
BIT DRILL 2 CANN GRADUATED (BIT) IMPLANT
BIT DRILL 2.5 CANN ENDOSCOPIC (BIT) IMPLANT
BIT DRILL 2.5 CANN LNG (BIT) IMPLANT
BIT DRILL 2.5 CANN STRL (BIT) IMPLANT
BIT DRILL 2.7 (BIT) ×1
BIT DRILL 2.7X2.7/3XSCR ANKL (BIT) IMPLANT
BIT DRL 2.7X2.7/3XSCR ANKL (BIT) ×1
BLADE SURG 15 STRL LF DISP TIS (BLADE) ×2 IMPLANT
BLADE SURG 15 STRL SS (BLADE) ×1
BNDG CMPR 5X4 CHSV STRCH STRL (GAUZE/BANDAGES/DRESSINGS) ×1
BNDG CMPR 5X6 CHSV STRCH STRL (GAUZE/BANDAGES/DRESSINGS) ×1
BNDG CMPR 9X4 STRL LF SNTH (GAUZE/BANDAGES/DRESSINGS) ×1
BNDG COHESIVE 4X5 TAN STRL LF (GAUZE/BANDAGES/DRESSINGS) ×2 IMPLANT
BNDG COHESIVE 6X5 TAN ST LF (GAUZE/BANDAGES/DRESSINGS) IMPLANT
BNDG ELASTIC 4X5.8 VLCR STR LF (GAUZE/BANDAGES/DRESSINGS) ×4 IMPLANT
BNDG ESMARK 4X9 LF (GAUZE/BANDAGES/DRESSINGS) IMPLANT
BNDG PLASTER X FAST 5X5 WHT LF (CAST SUPPLIES) IMPLANT
BNDG PLSTR 5X5 XFST ST WHT LF (CAST SUPPLIES)
CHLORAPREP W/TINT 26 (MISCELLANEOUS) ×2 IMPLANT
CLSR STERI-STRIP ANTIMIC 1/2X4 (GAUZE/BANDAGES/DRESSINGS) IMPLANT
COVER BACK TABLE 60X90IN (DRAPES) ×2 IMPLANT
CUFF TOURN SGL QUICK 18X4 (TOURNIQUET CUFF) ×2 IMPLANT
DRAPE EXTREMITY T 121X128X90 (DISPOSABLE) ×2 IMPLANT
DRAPE IMP U-DRAPE 54X76 (DRAPES) ×2 IMPLANT
DRAPE OEC MINIVIEW 54X84 (DRAPES) ×2 IMPLANT
DRAPE TOP 10253 STERILE (DRAPES) IMPLANT
DRAPE U-SHAPE 47X51 STRL (DRAPES) ×2 IMPLANT
DRSG ADAPTIC 3X8 NADH LF (GAUZE/BANDAGES/DRESSINGS) ×2 IMPLANT
ELECT REM PT RETURN 15FT ADLT (MISCELLANEOUS) ×2 IMPLANT
GAUZE PAD ABD 8X10 STRL (GAUZE/BANDAGES/DRESSINGS) ×4 IMPLANT
GAUZE SPONGE 4X4 12PLY STRL (GAUZE/BANDAGES/DRESSINGS) ×2 IMPLANT
GAUZE XEROFORM 1X8 LF (GAUZE/BANDAGES/DRESSINGS) IMPLANT
GLOVE BIO SURGEON STRL SZ7.5 (GLOVE) ×2 IMPLANT
GLOVE BIOGEL PI IND STRL 7.5 (GLOVE) ×2 IMPLANT
GLOVE BIOGEL PI IND STRL 8 (GLOVE) ×2 IMPLANT
GLOVE SURG SYN 7.5  E (GLOVE) ×1
GLOVE SURG SYN 7.5 E (GLOVE) ×1 IMPLANT
GLOVE SURG SYN 7.5 PF PI (GLOVE) ×2 IMPLANT
GOWN SPEC L4 XLG W/TWL (GOWN DISPOSABLE) ×2 IMPLANT
GOWN STRL NON-REIN LRG LVL3 (GOWN DISPOSABLE) ×2 IMPLANT
GUIDEWIRE 1.6 (WIRE) ×1
GUIDEWIRE ORTH 157X1.6XTROC (WIRE) IMPLANT
KIT BASIN OR (CUSTOM PROCEDURE TRAY) ×2 IMPLANT
KIT TURNOVER KIT A (KITS) IMPLANT
NDL HYPO 25X1 1.5 SAFETY (NEEDLE) IMPLANT
NDL MAYO 6 CRC TAPER PT (NEEDLE) IMPLANT
NDL MAYO CATGUT SZ4 TPR NDL (NEEDLE) IMPLANT
NEEDLE HYPO 25X1 1.5 SAFETY (NEEDLE) IMPLANT
NEEDLE MAYO 6 CRC TAPER PT (NEEDLE) IMPLANT
NEEDLE MAYO CATGUT SZ4 (NEEDLE) IMPLANT
PAD CAST 4YDX4 CTTN HI CHSV (CAST SUPPLIES) ×6 IMPLANT
PADDING CAST ABS COTTON 4X4 ST (CAST SUPPLIES) ×2 IMPLANT
PADDING CAST COTTON 4X4 STRL (CAST SUPPLIES) ×3
PENCIL SMOKE EVACUATOR (MISCELLANEOUS) ×2 IMPLANT
PLATE LOCKING MEDIAL HOOK 5H (Plate) IMPLANT
SCREW COMP KREULOCK 2.7X16 (Screw) IMPLANT
SCREW COMP KREULOCK 2.7X18 (Screw) IMPLANT
SCREW CORT 2.7X30MM (Screw) IMPLANT
SCREW CORT NL FMS 3.5X45 (Screw) IMPLANT
SCREW LOW PROFILE 3.5X30 (Screw) IMPLANT
SCREW NL STEEL CORT 3.5X34 (Screw) IMPLANT
SCREW NON LOCK 3.5X24MM (Screw) IMPLANT
SCREW NON LOCKING 3.5X26 ANKLE (Screw) IMPLANT
SLEEVE SCD COMPRESS KNEE MED (STOCKING) IMPLANT
SLING ARM FOAM STRAP LRG (SOFTGOODS) IMPLANT
SLING ARM FOAM STRAP MED (SOFTGOODS) IMPLANT
SPIKE FLUID TRANSFER (MISCELLANEOUS) IMPLANT
SPLINT PLASTER CAST XFAST 5X30 (CAST SUPPLIES) IMPLANT
STOCKINETTE 8 INCH (MISCELLANEOUS) ×2 IMPLANT
SUCTION FRAZIER HANDLE 10FR (MISCELLANEOUS) ×1
SUCTION TUBE FRAZIER 10FR DISP (MISCELLANEOUS) ×2 IMPLANT
SUT ETHILON 3 0 PS 1 (SUTURE) ×2 IMPLANT
SUT FIBERWIRE #2 38 REV NDL BL (SUTURE)
SUT FIBERWIRE #2 38 T-5 BLUE (SUTURE)
SUT MNCRL AB 3-0 PS2 18 (SUTURE) IMPLANT
SUT MNCRL AB 4-0 PS2 18 (SUTURE) IMPLANT
SUT MON AB 2-0 CT1 36 (SUTURE) IMPLANT
SUT VIC AB 0 CT1 27 (SUTURE) ×1
SUT VIC AB 0 CT1 27XBRD ANTBC (SUTURE) IMPLANT
SUT VIC AB 2-0 SH 27 (SUTURE) ×1
SUT VIC AB 2-0 SH 27XBRD (SUTURE) IMPLANT
SUT VIC AB 3-0 CT1 27 (SUTURE) ×1
SUT VIC AB 3-0 CT1 TAPERPNT 27 (SUTURE) ×2 IMPLANT
SUTURE FIBERWR #2 38 T-5 BLUE (SUTURE) IMPLANT
SUTURE FIBERWR#2 38 REV NDL BL (SUTURE) IMPLANT
SYR BULB EAR ULCER 3OZ GRN STR (SYRINGE) ×2 IMPLANT
TOWEL OR 17X26 10 PK STRL BLUE (TOWEL DISPOSABLE) ×2 IMPLANT
TUBING CONNECTING 10 (TUBING) ×2 IMPLANT
UNDERPAD 30X36 HEAVY ABSORB (UNDERPADS AND DIAPERS) ×2 IMPLANT

## 2022-07-14 NOTE — Interval H&P Note (Signed)
History and Physical Interval Note:  07/14/2022 7:11 AM  Mike Day  has presented today for surgery, with the diagnosis of LEFT ELBOW FRACTURE.  The various methods of treatment have been discussed with the patient and family. After consideration of risks, benefits and other options for treatment, the patient has consented to  Procedure(s): OPEN REDUCTION INTERNAL FIXATION (ORIF) OF LEFT ELBOW (Left) as a surgical intervention.  The patient's history has been reviewed, patient examined, no change in status, stable for surgery.  I have reviewed the patient's chart and labs.  Questions were answered to the patient's satisfaction.     [name]

## 2022-07-14 NOTE — Progress Notes (Addendum)
PROGRESS NOTE    Mike Day  ION:[id number] DOB: 08/26/1946 DOA: 07/10/2022 PCP: Ma Hillock, DO    Chief Complaint  Patient presents with   Fall   Hip Pain    Brief Narrative:  PMH of chronic A-fib on Eliquis, prostate cancer, SP prostatectomy, HTN, HLD, type II DM presented with a mechanical fall. Found to have left hip fracture and left elbow fracture with hematoma. Orthopedics were consulted.  Patient will require surgical management for both fractures.  Underwent intramedullary nail placement on 11/7 for hip fracture on the left.    Assessment & Plan:  Principal Problem:   Hip fracture ([place]) Active Problems:   Pure hypercholesterolemia   Essential hypertension   Permanent atrial fibrillation (HCC)   Olecranon fracture, left, closed, initial encounter   Acute postoperative anemia due to expected blood loss   Closed nondisplaced intertrochanteric fracture of left femur (HCC)   Closed fracture of left olecranon process    Assessment and Plan: * Hip fracture (HCC) Left intertrochanteric fx of left femur -Secondary to mechanical fall. -Continue to hold Eliquis.  -Patient seen in consultation by orthopedics and underwent femoral IM nail per Dr. Percell Miller 07/12/2022. -PT/ OT. -Pain management per orthopedics. -Per orthopedics.  Acute postoperative anemia due to expected blood loss -Patient with noted left elbow hematoma. -Patient denies any overt bleeding. -Hemoglobin trending down and currently at 8.3. -Anemia panel consistent with anemia of chronic disease. -Follow H&H. -Seizure threshold hemoglobin < 7.   Olecranon fracture, left, closed, initial encounter Left comminuted, mildly displaced fracture of proximal ulna and olecranon. -in long arm splint.  -Patient seen by orthopedics and patient for surgical repair today, 07/14/2022. -Per orthopedics.  Permanent atrial fibrillation (HCC) -Currently rate controlled.   -Eliquis on hold last dose 11/5.    -Orthopedics to advise when Eliquis can be resumed.   Essential hypertension -Continue Norvasc.   Pure hypercholesterolemia -Continue statin.   -Outpatient follow-up.         DVT prophylaxis: SCDs/postop DVT prophylaxis per orthopedics. Code Status: Full Family Communication: Updated patient.  Updated wife at bedside.  Disposition: TBD.  Likely SNF once medically stable and cleared by orthopedics.  Status is: Inpatient Remains inpatient appropriate because: Severity of illness.  Patient for surgery today, 07/14/2022   Consultants:  Orthopedics: Dr.Adair 07/10/2022 Orthopedics: Dr. Percell Miller 07/12/2022  Procedures:  CT left hip 07/11/2022 Plain films of the left elbow 07/10/2022 Plain films of the left hip and pelvis 07/10/2022, 07/12/2022 Plain films of the left knee 07/10/2022 Plain films of the left shoulder 07/10/2022 IM nail femoral, left 07/12/2022-Per orthopedics/Dr. Percell Miller  Antimicrobials:  Anti-infectives (From admission, onward)    Start     Dose/Rate Route Frequency Ordered Stop   07/15/22 0600  ceFAZolin (ANCEF) IVPB 2g/100 mL premix        2 g 200 mL/hr over 30 Minutes Intravenous On call to O.R. 07/14/22 1202 07/14/22 1320   07/12/22 1830  ceFAZolin (ANCEF) IVPB 2g/100 mL premix        2 g 200 mL/hr over 30 Minutes Intravenous Every 6 hours 07/12/22 1622 07/13/22 0030   07/12/22 1130  ceFAZolin (ANCEF) IVPB 2g/100 mL premix        2 g 200 mL/hr over 30 Minutes Intravenous On call to O.R. 07/12/22 1116 07/12/22 1242         Subjective: Patient laying in bed.  Seems to be in good spirits.  Family at bedside.  Denies any chest pain.  No shortness of  breath.  Feels some improvement with left hip pain since admission however still painful.  Left shoulder pain.  Denies any overt bleeding.    Objective: Vitals:   07/13/22 1333 07/13/22 2102 07/14/22 0611 07/14/22 1200  BP: 139/63 108/81 (!) 129/91 119/61  Pulse: 69 78 77 81  Resp: '16 20 17 17  '$ Temp: 98.9 F  (37.2 C) 98.2 F (36.8 C) 98.5 F (36.9 C)   TempSrc: Oral Oral Oral   SpO2: 99% 97% 100% 93%  Weight:      Height:        Intake/Output Summary (Last 24 hours) at 07/14/2022 1343 Last data filed at 07/14/2022 1320 Gross per 24 hour  Intake 2197.1 ml  Output 1000 ml  Net 1197.1 ml   Filed Weights   07/10/22 2055 07/12/22 1125  Weight: 85.6 kg 85.6 kg    Examination:  General exam: NAD. Respiratory system: CTA B.  No wheezes, no crackles, no rhonchi.  Fair air movement.  Speaking in full sentences.  No use of accessory muscles of respiration.   Cardiovascular system: Regular rate rhythm no murmurs rubs or gallops.  No JVD.  No lower extremity edema. Gastrointestinal system: Abdomen is soft, nontender, nondistended, positive bowel sounds.  No rebound.  No guarding.  Central nervous system: Alert and oriented. No focal neurological deficits. Extremities: LUE in splint and sling.  Left hip with postop dressing in place.  Skin: No rashes, lesions or ulcers Psychiatry: Judgement and insight appear normal. Mood & affect appropriate.     Data Reviewed:   CBC: Recent Labs  Lab 07/10/22 1618 07/12/22 0805 07/12/22 1640 07/13/22 0352 07/14/22 0333  WBC 10.0 6.3 9.9 9.0 6.9  NEUTROABS  --  4.5  --   --  5.1  HGB 13.5 12.1* 12.7* 9.6* 8.3*  HCT 39.7 35.6* 37.4* 27.7* 24.1*  MCV 101.8* 100.6* 102.2* 98.9 99.6  PLT 163 140* 136* 154 135*    Basic Metabolic Panel: Recent Labs  Lab 07/10/22 1618 07/12/22 0805 07/12/22 1640 07/13/22 0352 07/14/22 0333  NA 135 133*  --  130* 133*  K 4.3 4.0  --  4.5 4.1  CL 101 97*  --  98 98  CO2 27 28  --  24 28  GLUCOSE 153* 124*  --  186* 215*  BUN 18 13  --  14 14  CREATININE 0.86 0.74 0.74 0.73 0.64  CALCIUM 9.1 8.7*  --  8.2* 8.3*    GFR: Estimated Creatinine Clearance: 93.9 mL/min (by C-G formula based on SCr of 0.64 mg/dL).  Liver Function Tests: Recent Labs  Lab 07/12/22 0805  AST 13*  ALT 10  ALKPHOS 47  BILITOT  1.1  PROT 6.0*  ALBUMIN 3.4*    CBG: No results for input(s): "GLUCAP" in the last 168 hours.   Recent Results (from the past 240 hour(s))  Surgical PCR screen     Status: None   Collection Time: 07/12/22  6:45 AM   Specimen: Nasal Mucosa; Nasal Swab  Result Value Ref Range Status   MRSA, PCR NEGATIVE NEGATIVE Final   Staphylococcus aureus NEGATIVE NEGATIVE Final    Comment: (NOTE) The Xpert SA Assay (FDA approved for NASAL specimens in patients 35 years of age and older), is one component of a comprehensive surveillance program. It is not intended to diagnose infection nor to guide or monitor treatment. Performed at [facility], [place] [address], [place], [place] [address]   Culture, blood (Routine X 2) w  Reflex to ID Panel     Status: None (Preliminary result)   Collection Time: 07/13/22  3:52 AM   Specimen: BLOOD  Result Value Ref Range Status   Specimen Description   Final    BLOOD BLOOD RIGHT HAND Performed at [facility] [address] [place], [place] [address]    Special Requests   Final    BOTTLES DRAWN AEROBIC AND ANAEROBIC Blood Culture adequate volume Performed at [facility] [address] [place], [place] [address]    Culture   Final    NO GROWTH 1 DAY Performed at [facility], [place] [address] [place], [place] [address]    Report Status PENDING  Incomplete  Culture, blood (Routine X 2) w Reflex to ID Panel     Status: None (Preliminary result)   Collection Time: 07/13/22  3:58 AM   Specimen: BLOOD  Result Value Ref Range Status   Specimen Description   Final    BLOOD BLOOD RIGHT HAND Performed at [facility] [address] [place], [place] [address]    Special Requests   Final    BOTTLES DRAWN AEROBIC AND ANAEROBIC Blood Culture adequate volume Performed at [facility] [address] [place], [place] [address]    Culture   Final    NO  GROWTH 1 DAY Performed at [facility], [place] [address] [place],  [address]    Report Status PENDING  Incomplete         Radiology Studies: DG Hip Port Unilat With Pelvis 1V Left  Result Date: 07/12/2022 CLINICAL DATA:  Postop ORIF EXAM: DG HIP (WITH OR WITHOUT PELVIS) 1V PORT LEFT COMPARISON:  CT left hip 07/11/2022 FINDINGS: Intertrochanteric fracture has been fixed with a locking intramedullary rod and threaded screw extending into the femoral head. Fracture in good alignment. Hardware in satisfactory position. IMPRESSION: Satisfactory ORIF left intertrochanteric fracture. Electronically Signed   By: Franchot Gallo M.D.   On: 07/12/2022 14:58        Scheduled Meds:  acetaminophen  1,000 mg Oral Once   [MAR Hold] amLODipine  2.5 mg Oral Daily   chlorhexidine  60 mL Topical Once   [MAR Hold] cholecalciferol  2,000 Units Oral Daily   [MAR Hold] docusate sodium  100 mg Oral BID   fentaNYL (SUBLIMAZE) injection  50-100 mcg Intravenous UD   povidone-iodine  2 Application Topical Once   [MAR Hold] senna-docusate  1 tablet Oral BID   [MAR Hold] simvastatin  20 mg Oral QHS   Continuous Infusions:  lactated ringers 50 mL/hr at 07/14/22 1305   [MAR Hold] methocarbamol (ROBAXIN) IV       LOS: 4 days    Time spent: 35 minutes    Irine Seal, MD [facility]   To contact the attending provider between 7A-7P or the covering provider during after hours 7P-7A, please log into the web site [email or web address] and access using universal [place] password for that web site. If you do not have the password, please call the hospital operator.  07/14/2022, 1:43 PM

## 2022-07-14 NOTE — Anesthesia Procedure Notes (Signed)
Procedure Name: Intubation Date/Time: 07/14/2022 1:16 PM  Performed by: Maxwell Caul, CRNAPre-anesthesia Checklist: Patient identified, Emergency Drugs available, Suction available and Patient being monitored Patient Re-evaluated:Patient Re-evaluated prior to induction Oxygen Delivery Method: Circle system utilized Preoxygenation: Pre-oxygenation with 100% oxygen Induction Type: IV induction Ventilation: Mask ventilation without difficulty Laryngoscope Size: Mac and 4 Grade View: Grade I Tube type: Oral Tube size: 7.0 mm Number of attempts: 1 Airway Equipment and Method: Stylet Placement Confirmation: ETT inserted through vocal cords under direct vision, positive ETCO2 and breath sounds checked- equal and bilateral Secured at: 22 cm Tube secured with: Tape Dental Injury: Teeth and Oropharynx as per pre-operative assessment

## 2022-07-14 NOTE — Anesthesia Postprocedure Evaluation (Signed)
Anesthesia Post Note  Patient: Mike Day  Procedure(s) Performed: OPEN REDUCTION INTERNAL FIXATION (ORIF) OF LEFT ELBOW (Left: Elbow)     Patient location during evaluation: PACU Anesthesia Type: General Level of consciousness: awake and alert and oriented Pain management: pain level controlled Vital Signs Assessment: post-procedure vital signs reviewed and stable Respiratory status: spontaneous breathing, nonlabored ventilation and respiratory function stable Cardiovascular status: blood pressure returned to baseline and stable Postop Assessment: no apparent nausea or vomiting Anesthetic complications: no   No notable events documented.  Last Vitals:  Vitals:   07/14/22 1515 07/14/22 1530  BP: 138/79 (!) 148/94  Pulse: 85 83  Resp: (!) 21 15  Temp:    SpO2: 98% 100%    Last Pain:  Vitals:   07/14/22 1530  TempSrc:   PainSc: Asleep                 Tonita Bills A.

## 2022-07-14 NOTE — Transfer of Care (Signed)
Immediate Anesthesia Transfer of Care Note  Patient: Mike Day  Procedure(s) Performed: Procedure(s): OPEN REDUCTION INTERNAL FIXATION (ORIF) OF LEFT ELBOW (Left)  Patient Location: PACU  Anesthesia Type:General  Level of Consciousness: Alert, Awake, Oriented  Airway & Oxygen Therapy: Patient Spontanous Breathing  Post-op Assessment: Report given to RN  Post vital signs: Reviewed and stable  Last Vitals:  Vitals:   07/14/22 0611 07/14/22 1200  BP: (!) 129/91 119/61  Pulse: 77 81  Resp: 17 17  Temp: 36.9 C   SpO2: 397% 67%    Complications: No apparent anesthesia complications

## 2022-07-14 NOTE — Progress Notes (Signed)
S/p ORIF L Olecranon, pt may weight bear through the elbow with platform walker, may resume Eliquis tomorrow 11/10 from ortho standpoint if Hgb stable.

## 2022-07-14 NOTE — Anesthesia Preprocedure Evaluation (Addendum)
Anesthesia Evaluation  Patient identified by MRN, date of birth, ID band Patient awake    Reviewed: Allergy & Precautions, NPO status , Patient's Chart, lab work & pertinent test results  History of Anesthesia Complications Negative for: history of anesthetic complications  Airway Mallampati: II  TM Distance: >3 FB Neck ROM: Full    Dental no notable dental hx. (+) Dental Advisory Given   Pulmonary former smoker   Pulmonary exam normal breath sounds clear to auscultation       Cardiovascular hypertension, Pt. on medications Normal cardiovascular exam+ dysrhythmias Atrial Fibrillation  Rhythm:Irregular Rate:Normal   '20 TTE - EF 60-65%. There is severe asymmetric left ventricular hypertrophy. RV cavity was mildly enlarged. Left atrial size was moderately dilated. Right atrial size was moderately dilated. 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      Neuro/Psych  Neuromuscular disease negative neurological ROS  negative psych ROS   GI/Hepatic negative GI ROS, Neg liver ROS,,,  Endo/Other  Hyperlipidemia  Renal/GU negative Renal ROS   Hx/o Prostate Ca S/P prostatectomy    Musculoskeletal negative musculoskeletal ROS (+)  Fx left olecranon   Abdominal   Peds  Hematology  (+) Blood dyscrasia, anemia Eliquis therapy - last dose 11/5 Thrombocytopenia- Plt 135k   Anesthesia Other Findings   Reproductive/Obstetrics ED                              Anesthesia Physical Anesthesia Plan  ASA: 3  Anesthesia Plan: General   Post-op Pain Management: Regional block* and Minimal or no pain anticipated   Induction: Intravenous  PONV Risk Score and Plan: 3 and Treatment may vary due to age or medical condition and Ondansetron  Airway Management Planned: Oral ETT  Additional Equipment: None  Intra-op Plan:   Post-operative Plan: Extubation in OR  Informed Consent: I  have reviewed the patients History and Physical, chart, labs and discussed the procedure including the risks, benefits and alternatives for the proposed anesthesia with the patient or authorized representative who has indicated his/her understanding and acceptance.     Dental advisory given  Plan Discussed with: Anesthesiologist and CRNA  Anesthesia Plan Comments:         Anesthesia Quick Evaluation

## 2022-07-14 NOTE — Interval H&P Note (Signed)
History and Physical Interval Note:  07/14/2022 12:56 PM  Mike Day  has presented today for surgery, with the diagnosis of LEFT ELBOW FRACTURE.  The various methods of treatment have been discussed with the patient and family. After consideration of risks, benefits and other options for treatment, the patient has consented to  Procedure(s): OPEN REDUCTION INTERNAL FIXATION (ORIF) OF LEFT ELBOW (Left) as a surgical intervention.  The patient's history has been reviewed, patient examined, no change in status, stable for surgery.  I have reviewed the patient's chart and labs.  Questions were answered to the patient's satisfaction.     [name]

## 2022-07-14 NOTE — Progress Notes (Signed)
Unable to record vital signs after nerve block. The patient was taking back to the OR immediately after the block.

## 2022-07-14 NOTE — Plan of Care (Signed)
  Problem: Pain Managment: Goal: General experience of comfort will improve Outcome: Progressing   Problem: Safety: Goal: Ability to remain free from injury will improve Outcome: Progressing   

## 2022-07-14 NOTE — Anesthesia Procedure Notes (Signed)
Anesthesia Regional Block: Interscalene brachial plexus block   Pre-Anesthetic Checklist: , timeout performed,  Correct Patient, Correct Site, Correct Laterality,  Correct Procedure, Correct Position, site marked,  Risks and benefits discussed,  Surgical consent,  Pre-op evaluation,  At surgeon's request and post-op pain management  Laterality: Left  Prep: chloraprep       Needles:  Injection technique: Single-shot  Needle Type: Echogenic Stimulator Needle     Needle Length: 10cm  Needle Gauge: 21   Needle insertion depth: 5 cm   Additional Needles:   Procedures:,,,, ultrasound used (permanent image in chart),,    Narrative:  Start time: 07/14/2022 12:56 PM End time: 07/14/2022 1:01 PM Injection made incrementally with aspirations every 5 mL.  Performed by: Personally  Anesthesiologist: Josephine Igo, MD  Additional Notes: Timeout performed. Patient sedated. Relevant anatomy ID'd using [place]. Incremental 2-69m injection of LA with frequent aspiration. Patient tolerated procedure well.

## 2022-07-15 DIAGNOSIS — S52022A Displaced fracture of olecranon process without intraarticular extension of left ulna, initial encounter for closed fracture: Secondary | ICD-10-CM | POA: Diagnosis not present

## 2022-07-15 DIAGNOSIS — I10 Essential (primary) hypertension: Secondary | ICD-10-CM | POA: Diagnosis not present

## 2022-07-15 DIAGNOSIS — S72145A Nondisplaced intertrochanteric fracture of left femur, initial encounter for closed fracture: Secondary | ICD-10-CM | POA: Diagnosis not present

## 2022-07-15 DIAGNOSIS — K5900 Constipation, unspecified: Secondary | ICD-10-CM | POA: Insufficient documentation

## 2022-07-15 DIAGNOSIS — I4821 Permanent atrial fibrillation: Secondary | ICD-10-CM | POA: Diagnosis not present

## 2022-07-15 LAB — CBC WITH DIFFERENTIAL/PLATELET
Abs Immature Granulocytes: 0.04 10*3/uL (ref 0.00–0.07)
Basophils Absolute: 0 10*3/uL (ref 0.0–0.1)
Basophils Relative: 0 %
Eosinophils Absolute: 0 10*3/uL (ref 0.0–0.5)
Eosinophils Relative: 0 %
HCT: 23.7 % — ABNORMAL LOW (ref 39.0–52.0)
Hemoglobin: 8.2 g/dL — ABNORMAL LOW (ref 13.0–17.0)
Immature Granulocytes: 0 %
Lymphocytes Relative: 9 %
Lymphs Abs: 0.8 10*3/uL (ref 0.7–4.0)
MCH: 34.7 pg — ABNORMAL HIGH (ref 26.0–34.0)
MCHC: 34.6 g/dL (ref 30.0–36.0)
MCV: 100.4 fL — ABNORMAL HIGH (ref 80.0–100.0)
Monocytes Absolute: 0.8 10*3/uL (ref 0.1–1.0)
Monocytes Relative: 9 %
Neutro Abs: 7.3 10*3/uL (ref 1.7–7.7)
Neutrophils Relative %: 82 %
Platelets: 169 10*3/uL (ref 150–400)
RBC: 2.36 MIL/uL — ABNORMAL LOW (ref 4.22–5.81)
RDW: 12.4 % (ref 11.5–15.5)
WBC: 8.9 10*3/uL (ref 4.0–10.5)
nRBC: 0 % (ref 0.0–0.2)

## 2022-07-15 LAB — BASIC METABOLIC PANEL
Anion gap: 7 (ref 5–15)
BUN: 13 mg/dL (ref 8–23)
CO2: 28 mmol/L (ref 22–32)
Calcium: 8.5 mg/dL — ABNORMAL LOW (ref 8.9–10.3)
Chloride: 95 mmol/L — ABNORMAL LOW (ref 98–111)
Creatinine, Ser: 0.72 mg/dL (ref 0.61–1.24)
GFR, Estimated: >60 mL/min (ref >60)
Glucose, Bld: 182 mg/dL — ABNORMAL HIGH (ref 70–99)
Potassium: 4.4 mmol/L (ref 3.5–5.1)
Sodium: 130 mmol/L — ABNORMAL LOW (ref 135–145)

## 2022-07-15 LAB — MAGNESIUM: Magnesium: 1.8 mg/dL (ref 1.7–2.4)

## 2022-07-15 MED ORDER — APIXABAN 5 MG PO TABS
5.0000 mg | ORAL_TABLET | Freq: Two times a day (BID) | ORAL | Status: DC
  Administered 2022-07-15 – 2022-07-18 (×6): 5 mg via ORAL
  Filled 2022-07-15 (×6): qty 1

## 2022-07-15 MED ORDER — POLYETHYLENE GLYCOL 3350 17 G PO PACK
17.0000 g | PACK | Freq: Two times a day (BID) | ORAL | Status: DC
  Administered 2022-07-15 – 2022-07-18 (×6): 17 g via ORAL
  Filled 2022-07-15 (×6): qty 1

## 2022-07-15 MED ORDER — SORBITOL 70 % SOLN
30.0000 mL | Status: AC
  Filled 2022-07-15 (×2): qty 30

## 2022-07-15 MED ORDER — LISINOPRIL 20 MG PO TABS
20.0000 mg | ORAL_TABLET | Freq: Every day | ORAL | Status: DC
  Administered 2022-07-16 – 2022-07-18 (×3): 20 mg via ORAL
  Filled 2022-07-15 (×3): qty 1

## 2022-07-15 MED ORDER — BISACODYL 10 MG RE SUPP
10.0000 mg | Freq: Once | RECTAL | Status: AC
  Administered 2022-07-15: 10 mg via RECTAL
  Filled 2022-07-15: qty 1

## 2022-07-15 MED ORDER — ZOLPIDEM TARTRATE 5 MG PO TABS: 5.0000 mg | ORAL_TABLET | Freq: Every evening | ORAL | Status: DC | PRN

## 2022-07-15 NOTE — Progress Notes (Signed)
Physical Therapy Treatment Patient Details Name: Mike Day MRN: [id number] DOB: [birth year] Today's Date: 07/15/2022   History of Present Illness Pt is a 76 year old male admitted after fall and found to have left hip fracture and left elbow fracture with hematoma.  Pt s/p L IM nail 07/12/22 and pending left elbow ORIF 07/14/22. PMH of chronic A-fib on Eliquis, prostate cancer, SP prostatectomy, HTN, HLD, type II DM    PT Comments    Pt received supine in bed POD2 s/p L-M nail and POD1 s/p L-elbow ORIF, in good spirits and eager to mobilize but expressing pain and some frustration with recent falls at home, wife present for duration of session. Required max assist +2 for all mobility today including bed mobility and transfers, pt expressing high degree of L hip and L elbow pain during mobility and some fear of WB through either LLE or LUE. Provided squeeze ball for L hand as pt presents with some swelling but has good wrist and finger ROM, encouraged using it hourly. Positioned pt in recliner for comfort and currently in communication with RN regarding return transfer to bed later today as pt status and schedule allows. Discharge destination discussed with pt and family and all are agreeable to PT recommendation of SNF-level therapies. We will continue to follow acutely.    Recommendations for follow up therapy are one component of a multi-disciplinary discharge planning process, led by the attending physician.  Recommendations may be updated based on patient status, additional functional criteria and insurance authorization.  Follow Up Recommendations  Skilled nursing-short term rehab (<3 hours/day) Can patient physically be transported by private vehicle: No   Assistance Recommended at Discharge Frequent or constant Supervision/Assistance  Patient can return home with the following Two people to help with walking and/or transfers;Two people to help with bathing/dressing/bathroom;Assist for  transportation;Help with stairs or ramp for entrance;Assistance with cooking/housework   Equipment Recommendations  None recommended by PT (TBD at SNF)    Recommendations for Other Services       Precautions / Restrictions Precautions Precautions: Fall Precaution Comments: Pt has had many recent falls Restrictions Weight Bearing Restrictions: Yes LUE Weight Bearing: Weight bear through elbow only LLE Weight Bearing: Weight bearing as tolerated     Mobility  Bed Mobility Overal bed mobility: Needs Assistance Bed Mobility: Supine to Sit     Supine to sit: Max assist, HOB elevated, +2 for physical assistance     General bed mobility comments: Pt requires max assist via chuck pad to elevate trunk and bring LLE off bed, pt very fearful of moving either LUE and LLE.    Transfers Overall transfer level: Needs assistance Equipment used: Rolling walker (2 wheels) Transfers: Sit to/from Stand, Bed to chair/wheelchair/BSC Sit to Stand: Max assist, +2 physical assistance, From elevated surface   Step pivot transfers: Mod assist, +2 physical assistance, +2 safety/equipment       General transfer comment: Pt required max assist +2 for lift assist with multimodal cuing for hand placement, pt very fearful of moving LLE. Upon standing, pt maintains weight on R side of body but with max assist is able to place L elbow on platform walker. Step pivot: pt required max assist for AD management, scooting of LLE, pt sliding RLE on floor unable to offload weight onto BUE and LLE. Pt required max encouragement to lower down to [facility] to attempt BM with multimodal cuing to place R hand on railing for assisted lower, max assist to straighten LLE  during sitting. Pt also transferred from [facility] to recliner via sit to stand and stand pivot, requiring lift assist under ischial tuberosities and via gait belt. Once in recliner pt able to scoot hips backward himself. Further mobility deferred.    Ambulation/Gait                    Stairs             Wheelchair Mobility    Modified Rankin (Stroke Patients Only)       Balance Overall balance assessment: Needs assistance, History of Falls Sitting-balance support: Feet supported, Single extremity supported Sitting balance-Leahy Scale: Poor Sitting balance - Comments: requires assist sitting EOB, heavy use of RUE to maintain upright sitting. Able to support self while sittting on bSC.   Standing balance support: Reliant on assistive device for balance, During functional activity, Bilateral upper extremity supported Standing balance-Leahy Scale: Poor Standing balance comment: Pt reluctant to bear weight through LLE                            Cognition Arousal/Alertness: Awake/alert Behavior During Therapy: WFL for tasks assessed/performed Overall Cognitive Status: Within Functional Limits for tasks assessed                                 General Comments: making jokes most of the time        Exercises General Exercises - Upper Extremity Digit Composite Flexion: AROM, Left, 5 reps, Squeeze ball General Exercises - Lower Extremity Ankle Circles/Pumps: AROM, Both, 20 reps, Seated    General Comments General comments (skin integrity, edema, etc.): Wife Stanton Kidney present      Pertinent Vitals/Pain Pain Assessment Pain Assessment: 0-10 Pain Score: 7  Pain Location: left elbow 7/10 and left hip 2/10 Pain Descriptors / Indicators: Grimacing, Sore Pain Intervention(s): Monitored during session, Repositioned, Limited activity within patient's tolerance    Home Living                          Prior Function            PT Goals (current goals can now be found in the care plan section) Acute Rehab PT Goals Patient Stated Goal: To stop falling PT Goal Formulation: With patient Time For Goal Achievement: 07/27/22 Potential to Achieve Goals: Good Progress towards PT goals: Progressing toward  goals    Frequency    Min 3X/week      PT Plan Current plan remains appropriate    Co-evaluation              AM-PAC PT "6 Clicks" Mobility   Outcome Measure  Help needed turning from your back to your side while in a flat bed without using bedrails?: A Lot Help needed moving from lying on your back to sitting on the side of a flat bed without using bedrails?: Total Help needed moving to and from a bed to a chair (including a wheelchair)?: Total Help needed standing up from a chair using your arms (e.g., wheelchair or bedside chair)?: Total Help needed to walk in hospital room?: Total Help needed climbing 3-5 steps with a railing? : Total 6 Click Score: 7    End of Session Equipment Utilized During Treatment: Gait belt;Other (comment) (LLE sling) Activity Tolerance: Patient tolerated treatment well Patient left: with call bell/phone within reach;in  chair;with chair alarm set;with family/visitor present Nurse Communication: Mobility status PT Visit Diagnosis: Other abnormalities of gait and mobility (R26.89);Pain Pain - Right/Left: Left Pain - part of body: Hip;Shoulder;Arm     Time: 1898-4210 PT Time Calculation (min) (ACUTE ONLY): 44 min  Charges:  $Therapeutic Activity: 38-52 mins                     Coolidge Breeze, PT, DPT [facility] Department Office: [phone] Weekend pager: [phone]   Coolidge Breeze 07/15/2022, 11:20 AM

## 2022-07-15 NOTE — Progress Notes (Signed)
Physical Therapy Treatment Patient Details Name: Mike Day MRN: [id number] DOB: 10/26/1945 Today's Date: 07/15/2022        PT Comments      Pt received upright in recliner with NT and RN to assist to [facility] for suppository placement. Pt required max assist +2 for all mobility today with the exception of rolling in supine which was mod assist. With each additional transfer, pt required further cuing and increased assist, with last transfer requiring use of STEDY. For full details, see Transfers section below. Pt remains motivated but limited by pain, fatigue, significance of recent procedures, and fear of falling. Pt in bed with RN and NT providing care at end of session. Discharge destination remains appropriate. We will continue to follow.        07/15/22 1355  PT Visit Information  Last PT Received On 07/15/22  Assistance Needed +2  History of Present Illness Pt is a 76 year old male admitted after fall and found to have left hip fracture and left elbow fracture with hematoma.  Pt s/p L IM nail 07/12/22 and pending left elbow ORIF 07/14/22. PMH of chronic A-fib on Eliquis, prostate cancer, SP prostatectomy, HTN, HLD, type II DM  Subjective Data  Subjective I've had better weeks  Patient Stated Goal To stop falling  Precautions  Precautions Fall  Precaution Comments Pt has had many recent falls  Restrictions  Weight Bearing Restrictions Yes  LUE Weight Bearing Weight bear through elbow only  LLE Weight Bearing WBAT  Pain Assessment  Pain Assessment 0-10  Pain Score 10  Pain Location L elbow and L hip  Pain Descriptors / Indicators Grimacing;Sore;Operative site guarding  Pain Intervention(s) Limited activity within patient's tolerance;Monitored during session;Repositioned  Cognition  Arousal/Alertness Awake/alert  Behavior During Therapy WFL for tasks assessed/performed  Overall Cognitive Status Within Functional Limits for tasks assessed  Bed Mobility  Overal bed mobility Needs  Assistance  Bed Mobility Supine to Sit;Rolling  Rolling Mod assist  Sit to supine Max assist;+2 for physical assistance  General bed mobility comments Pt requires max assist +2 via chuck pad to lower trunk and bring LLE on bed, pt very fearful of moving either LUE and LLE. Mod assist for rolling in bed.  Transfers  Overall transfer level Needs assistance  Equipment used Rolling walker (2 wheels)  Transfers Sit to/from Stand;Bed to chair/wheelchair/BSC  Sit to Stand Max assist;+2 physical assistance;From elevated surface;+2 safety/equipment  Step pivot transfers +2 physical assistance;+2 safety/equipment;Max assist  General transfer comment Pt required max assist +2 for lift assist with multimodal cuing for hand placement; pt remains very fearful of moving/placing weight on LLE and generally is total assist for moving LUE and placing it on platform. Upon standing, pt requires heavy max assist +2 for transferring to [facility] with HEAVY using of multimodal cuing for sequencing, max assist for sliding feet, and pt attempting to push RW away and sit back down on recliner. With multimodal cuing at R hip pt able to reach [facility] and sit for BM. Pt able to maintain sitting with supervision for safety. Attempted sit to stand transfer again from [facility] to bed with max assist +2 but discontinued secondary to pt fatigue and pain as well as therapist fatigue. RN provided Steady and pt was able to complete sit to stand with max assist +2 provided at ITs and pt using RUE to pull up on Steady. Pt stood for pericare with hand on steady and with increased time in standing leaned more and  more heavily on PT shoulder (leaning to R lateral side) requiring near total assist. Pt transferred to EOB via Steady, repositioned in bed as above, further mobility deferred. RN and NT in room throughout, remained in room at exit.  Ambulation/Gait  General Gait Details Unsafe at present  Balance  Overall balance assessment Needs  assistance;History of Falls  Sitting-balance support Feet supported;Single extremity supported  Sitting balance-Leahy Scale Poor  Sitting balance - Comments requires assist sitting EOB, heavy use of RUE to maintain upright sitting. Able to support self while sittting on bSC with supervision  Standing balance support Reliant on assistive device for balance;During functional activity;Bilateral upper extremity supported  Standing balance-Leahy Scale Zero  Standing balance comment Pt only able to stand wtih max/total assist +2  PT - End of Session  Equipment Utilized During Treatment Gait belt;Other (comment) (LLE sling)  Activity Tolerance Patient limited by pain;Patient limited by fatigue  Patient left with call bell/phone within reach;in bed;with bed alarm set;with family/visitor present;with nursing/sitter in room  Nurse Communication Mobility status   PT - Assessment/Plan  PT Plan Current plan remains appropriate  PT Visit Diagnosis Other abnormalities of gait and mobility (R26.89);Pain  Pain - Right/Left Left  Pain - part of body Hip;Shoulder;Arm  PT Frequency (ACUTE ONLY) Min 3X/week  Follow Up Recommendations Skilled nursing-short term rehab (<3 hours/day)  Can patient physically be transported by private vehicle No  Assistance recommended at discharge Frequent or constant Supervision/Assistance  Patient can return home with the following Two people to help with walking and/or transfers;Two people to help with bathing/dressing/bathroom;Assist for transportation;Help with stairs or ramp for entrance;Assistance with cooking/housework  PT equipment None recommended by PT (TBD at SNF)  AM-PAC PT "6 Clicks" Mobility Outcome Measure (Version 2)  Help needed turning from your back to your side while in a flat bed without using bedrails? 2  Help needed moving from lying on your back to sitting on the side of a flat bed without using bedrails? 1  Help needed moving to and from a bed to a chair  (including a wheelchair)? 1  Help needed standing up from a chair using your arms (e.g., wheelchair or bedside chair)? 1  Help needed to walk in hospital room? 1  Help needed climbing 3-5 steps with a railing?  1  6 Click Score 7  Consider Recommendation of Discharge To: CIR/SNF/LTACH  Progressive Mobility  What is the highest level of mobility based on the progressive mobility assessment? Level 3 (Stands with assist) - Balance while standing  and cannot march in place  Activity Stood at bedside;Transferred to/from [facility]  PT Goal Progression  Progress towards PT goals Progressing toward goals  Acute Rehab PT Goals  PT Goal Formulation With patient  Time For Goal Achievement 07/27/22  Potential to Achieve Goals Good  PT Time Calculation  PT Start Time (ACUTE ONLY) 1335  PT Stop Time (ACUTE ONLY) 1430  PT Time Calculation (min) (ACUTE ONLY) 55 min  PT General Charges  $$ ACUTE PT VISIT 1 Visit  PT Treatments  $Therapeutic Activity 53-67 mins  Coolidge Breeze, PT, DPT [facility] Office: [phone] Weekend pager: [phone]

## 2022-07-15 NOTE — Progress Notes (Signed)
PROGRESS NOTE    Mike Day  VWU:[id number] DOB: 1945/11/21 DOA: 07/10/2022 PCP: Ma Hillock, DO    Chief Complaint  Patient presents with   Fall   Hip Pain    Brief Narrative:  PMH of chronic A-fib on Eliquis, prostate cancer, SP prostatectomy, HTN, HLD, type II DM presented with a mechanical fall. Found to have left hip fracture and left elbow fracture with hematoma. Orthopedics were consulted.  Patient will require surgical management for both fractures.  Underwent intramedullary nail placement on 11/7 for hip fracture on the left.    Assessment & Plan:  Principal Problem:   Hip fracture ([place]) Active Problems:   Pure hypercholesterolemia   Essential hypertension   Permanent atrial fibrillation (HCC)   Olecranon fracture, left, closed, initial encounter   Acute postoperative anemia due to expected blood loss   Closed nondisplaced intertrochanteric fracture of left femur (HCC)   Closed fracture of left olecranon process   Constipation    Assessment and Plan: * Hip fracture ([place]) Left intertrochanteric fx of left femur -Secondary to mechanical fall. -Continue to hold Eliquis.  -Patient seen in consultation by orthopedics and underwent femoral IM nail per Dr. Percell Miller 07/12/2022. -PT/ OT. -Likely need SNF. -Pain management per orthopedics. -Per orthopedics.  Constipation -Patient with complaints of constipation. -Noted to have had some prune juice. -Placed on MiraLAX twice daily. -Continue Senokot-S twice daily. -Sorbitol every 3 hours x2 doses. -Dulcolax suppository x1. -If no results on bowel regimen will need an enema.  Acute postoperative anemia due to expected blood loss -Patient with noted left elbow hematoma. -Patient denies any overt bleeding. -Hemoglobin trending down and seems to be stabilizing currently at 8.2 today.  -Anemia panel consistent with anemia of chronic disease. -Follow H&H. -Transfusion threshold hemoglobin < 7.   Olecranon  fracture, left, closed, initial encounter Left comminuted, mildly displaced fracture of proximal ulna and olecranon. -in long arm splint.  -Patient seen by orthopedics and patient status post ORIF left elbow per orthopedics 07/14/2022.  -PT/OT. -Per orthopedics.  Permanent atrial fibrillation (HCC) -Currently rate controlled.   -Eliquis on hold last dose 11/5.   -Per orthopedics may resume Eliquis today if hemoglobin stable.   Essential hypertension -Continue Norvasc.  -Resume home regimen lisinopril.  Pure hypercholesterolemia -Continue statin.   -Outpatient follow-up.         DVT prophylaxis: SCDs/postop DVT prophylaxis per orthopedics. Code Status: Full Family Communication: Updated patient.  Updated son at bedside.   Disposition: Likely SNF on 07/18/2022.  Status is: Inpatient Remains inpatient appropriate because: Severity of illness.  Patient for surgery today, 07/14/2022   Consultants:  Orthopedics: Dr.Adair 07/10/2022 Orthopedics: Dr. Percell Miller 07/12/2022  Procedures:  CT left hip 07/11/2022 Plain films of the left elbow 07/10/2022 Plain films of the left hip and pelvis 07/10/2022, 07/12/2022 Plain films of the left knee 07/10/2022 Plain films of the left shoulder 07/10/2022 IM nail femoral, left 07/12/2022-Per orthopedics/Dr. Percell Miller ORIF left elbow per orthopedics: Dr. Percell Miller 07/14/2022  Antimicrobials:  Anti-infectives (From admission, onward)    Start     Dose/Rate Route Frequency Ordered Stop   07/15/22 0600  ceFAZolin (ANCEF) IVPB 2g/100 mL premix        2 g 200 mL/hr over 30 Minutes Intravenous On call to O.R. 07/14/22 1202 07/15/22 0630   07/12/22 1830  ceFAZolin (ANCEF) IVPB 2g/100 mL premix        2 g 200 mL/hr over 30 Minutes Intravenous Every 6 hours 07/12/22 1622 07/13/22 0030  07/12/22 1130  ceFAZolin (ANCEF) IVPB 2g/100 mL premix        2 g 200 mL/hr over 30 Minutes Intravenous On call to O.R. 07/12/22 1116 07/12/22 1242          Subjective: Sitting up in chair.  C/o constipation.  Denies any chest pain, no shortness of breath, no abdominal pain, no bleeding.   Objective: Vitals:   07/15/22 0143 07/15/22 0447 07/15/22 0859 07/15/22 1721  BP: (!) 146/87 (!) 145/77 129/75 (!) 145/84  Pulse: 80 77 73 80  Resp: '15 16 15 16  '$ Temp: 97.8 F (36.6 C) 97.8 F (36.6 C) 98.2 F (36.8 C) 98.4 F (36.9 C)  TempSrc:   Oral Oral  SpO2: 100% 100% 97% 99%  Weight:      Height:        Intake/Output Summary (Last 24 hours) at 07/15/2022 2022 Last data filed at 07/15/2022 1537 Gross per 24 hour  Intake 1804.2 ml  Output 1200 ml  Net 604.2 ml   Filed Weights   07/10/22 2055 07/12/22 1125  Weight: 85.6 kg 85.6 kg    Examination:  General exam: NAD. Respiratory system: Lungs clear to auscultation bilaterally.  No wheezes, no crackles, no rhonchi.  Fair air movement.  Speaking in full sentences.  Cardiovascular system: Irregularly irregular.  No JVD, no lower extremity edema. Gastrointestinal system: Abdomen is soft, nontender, nondistended, positive bowel sounds.  No rebound.  No guarding.  Central nervous system: Alert and oriented. No focal neurological deficits. Extremities: LUE in splint and sling.  Left hip with postop dressing in place.  Skin: No rashes, lesions or ulcers Psychiatry: Judgement and insight appear normal. Mood & affect appropriate.     Data Reviewed:   CBC: Recent Labs  Lab 07/12/22 0805 07/12/22 1640 07/13/22 0352 07/14/22 0333 07/15/22 0854  WBC 6.3 9.9 9.0 6.9 8.9  NEUTROABS 4.5  --   --  5.1 7.3  HGB 12.1* 12.7* 9.6* 8.3* 8.2*  HCT 35.6* 37.4* 27.7* 24.1* 23.7*  MCV 100.6* 102.2* 98.9 99.6 100.4*  PLT 140* 136* 154 135* 295    Basic Metabolic Panel: Recent Labs  Lab 07/10/22 1618 07/12/22 0805 07/12/22 1640 07/13/22 0352 07/14/22 0333 07/15/22 0854  NA 135 133*  --  130* 133* 130*  K 4.3 4.0  --  4.5 4.1 4.4  CL 101 97*  --  98 98 95*  CO2 27 28  --  '24 28 28   '$ GLUCOSE 153* 124*  --  186* 215* 182*  BUN 18 13  --  '14 14 13  '$ CREATININE 0.86 0.74 0.74 0.73 0.64 0.72  CALCIUM 9.1 8.7*  --  8.2* 8.3* 8.5*  MG  --   --   --   --   --  1.8    GFR: Estimated Creatinine Clearance: 93.9 mL/min (by C-G formula based on SCr of 0.72 mg/dL).  Liver Function Tests: Recent Labs  Lab 07/12/22 0805  AST 13*  ALT 10  ALKPHOS 47  BILITOT 1.1  PROT 6.0*  ALBUMIN 3.4*    CBG: No results for input(s): "GLUCAP" in the last 168 hours.   Recent Results (from the past 240 hour(s))  Surgical PCR screen     Status: None   Collection Time: 07/12/22  6:45 AM   Specimen: Nasal Mucosa; Nasal Swab  Result Value Ref Range Status   MRSA, PCR NEGATIVE NEGATIVE Final   Staphylococcus aureus NEGATIVE NEGATIVE Final    Comment: (NOTE) The Xpert SA  Assay (FDA approved for NASAL specimens in patients 68 years of age and older), is one component of a comprehensive surveillance program. It is not intended to diagnose infection nor to guide or monitor treatment. Performed at [facility], [place] [address] [place], [place] [address]   Culture, blood (Routine X 2) w Reflex to ID Panel     Status: None (Preliminary result)   Collection Time: 07/13/22  3:52 AM   Specimen: BLOOD  Result Value Ref Range Status   Specimen Description   Final    BLOOD BLOOD RIGHT HAND Performed at [facility] [address], [place], [place] [address]    Special Requests   Final    BOTTLES DRAWN AEROBIC AND ANAEROBIC Blood Culture adequate volume Performed at [facility] [address], [place], [place] [address]    Culture   Final    NO GROWTH 2 DAYS Performed at [facility] [address], [place], [place] [address]    Report Status PENDING  Incomplete  Culture, blood (Routine X 2) w Reflex to ID Panel     Status: None (Preliminary result)   Collection Time: 07/13/22  3:58 AM   Specimen: BLOOD  Result Value  Ref Range Status   Specimen Description   Final    BLOOD BLOOD RIGHT HAND Performed at [facility] [address], [place], [place] [address]    Special Requests   Final    BOTTLES DRAWN AEROBIC AND ANAEROBIC Blood Culture adequate volume Performed at [facility] [address], [place], [place] [address]    Culture   Final    NO GROWTH 2 DAYS Performed at [facility] [address], [place],  [address]    Report Status PENDING  Incomplete         Radiology Studies: No results found.      Scheduled Meds:  amLODipine  2.5 mg Oral Daily   cholecalciferol  2,000 Units Oral Daily   lisinopril  20 mg Oral Daily   polyethylene glycol  17 g Oral BID   senna-docusate  1 tablet Oral BID   simvastatin  20 mg Oral QHS   Continuous Infusions:  methocarbamol (ROBAXIN) IV       LOS: 5 days    Time spent: 35 minutes    Irine Seal, MD [facility]   To contact the attending provider between 7A-7P or the covering provider during after hours 7P-7A, please log into the web site [email or web address] and access using universal [place] password for that web site. If you do not have the password, please call the hospital operator.  07/15/2022, 8:22 PM

## 2022-07-15 NOTE — Plan of Care (Signed)

## 2022-07-15 NOTE — Assessment & Plan Note (Signed)
-  Patient with complaints of constipation. -Noted to have had some prune juice. -Placed on MiraLAX twice daily. -Continue Senokot-S twice daily. -Sorbitol every 3 hours x2 doses. -Dulcolax suppository x1. -If no results on bowel regimen will need an enema.

## 2022-07-15 NOTE — NC FL2 (Signed)
[place] LEVEL OF CARE SCREENING TOOL     IDENTIFICATION  Patient Name: Mike Day Birthdate: [birth year] Sex: male Admission Date (Current Location): 07/10/2022  [facility] and [place] Number:  Herbalist and Address:  [facility],  [place], [place]      Provider Number: [id number]  Attending Physician Name and Address:  Eugenie Filler, MD  Relative Name and Phone Number:  wife, Desmond Szabo    Current Level of Care: Hospital Recommended Level of Care: [place] Prior Approval Number:    Date Approved/Denied:   PASRR Number:    Discharge Plan: SNF    Current Diagnoses: Patient Active Problem List   Diagnosis Date Noted   Acute postoperative anemia due to expected blood loss 07/13/2022   Closed nondisplaced intertrochanteric fracture of left femur ([place]) 07/13/2022   Closed fracture of left olecranon process 07/13/2022   Hip fracture ([place]) 07/10/2022   Olecranon fracture, left, closed, initial encounter 07/10/2022   Night sweats 12/10/2021   Aortic atherosclerosis ([place]) 12/09/2021   Osteopenia 09/14/2021   Compression fracture of first lumbar vertebra with delayed healing 09/03/2021   Thoracic back pain 09/03/2021   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   Vitamin D deficiency 08/19/2021   B12 deficiency 08/19/2021   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   Post herpetic neuralgia 03/15/2019   Hyperkalemia 03/15/2019   Cerebral atrophy ([place]) 03/15/2019   Cerebral arterial disease 03/15/2019   Permanent atrial fibrillation ([place]) 10/07/2014   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   Prediabetes 03/11/2009   Essential hypertension 09/17/2008   Pure hypercholesterolemia 11/01/2007   PROSTATE CANCER, HX OF 01/18/2007   Tubular adenoma of colon 01/12/2007    Orientation RESPIRATION BLADDER Height & Weight     Self, Time, Situation, Place  Normal Continent  Weight: 188 lb 11.4 oz (85.6 kg) Height:  '6\' 3"'$  (190.5 cm)  BEHAVIORAL SYMPTOMS/MOOD NEUROLOGICAL BOWEL NUTRITION STATUS      Continent Diet  AMBULATORY STATUS COMMUNICATION OF NEEDS Skin   Extensive Assist Verbally Other (Comment) (surgical incisions)                       Personal Care Assistance Level of Assistance  Bathing, Dressing Bathing Assistance: Limited assistance   Dressing Assistance: Limited assistance     Functional Limitations Info             [place]  PT (By licensed PT), OT (By licensed OT)     PT Frequency: 5x/wk OT Frequency: 5x/wk            Contractures Contractures Info: Not present    Additional Factors Info  Code Status, Allergies Code Status Info: Full Allergies Info: Levofloxacin, Neosporin (Neomycin-bacitracin Zn-polymyx), Tape           Current Medications (07/15/2022):  This is the current hospital active medication list Current Facility-Administered Medications  Medication Dose Route Frequency Provider Last Rate Last Admin   acetaminophen (TYLENOL) tablet 1,000 mg  1,000 mg Oral Q8H PRN Chadwell, Joshua, PA-C       amLODipine (NORVASC) tablet 2.5 mg  2.5 mg Oral Daily McBane, Maylene Roes, PA-C   2.5 mg at 07/15/22 0901   bisacodyl (DULCOLAX) suppository 10 mg  10 mg Rectal Once Eugenie Filler, MD       cholecalciferol (VITAMIN D3) 25 MCG (1000 UNIT) tablet 2,000 Units  2,000 Units Oral Daily Chadwell, Vonna Kotyk, PA-C  2,000 Units at 07/15/22 0901   HYDROcodone-acetaminophen (NORCO) 7.5-325 MG per tablet 1-2 tablet  1-2 tablet Oral Q4H PRN Chriss Czar, PA-C   2 tablet at 07/14/22 3785   HYDROcodone-acetaminophen (NORCO/VICODIN) 5-325 MG per tablet 1-2 tablet  1-2 tablet Oral Q4H PRN Chriss Czar, PA-C   2 tablet at 07/15/22 8850   lactated ringers infusion   Intravenous Continuous Chadwell, Joshua, PA-C 50 mL/hr at 07/15/22 1013 Infusion Verify at 07/15/22 1013   menthol-cetylpyridinium (CEPACOL)  lozenge 3 mg  1 lozenge Oral PRN Chadwell, Vonna Kotyk, PA-C       Or   phenol (CHLORASEPTIC) mouth spray 1 spray  1 spray Mouth/Throat PRN Chadwell, Joshua, PA-C       methocarbamol (ROBAXIN) tablet 500 mg  500 mg Oral Q6H PRN Chadwell, Joshua, PA-C       Or   methocarbamol (ROBAXIN) 500 mg in dextrose 5 % 50 mL IVPB  500 mg Intravenous Q6H PRN Chadwell, Joshua, PA-C       morphine (PF) 2 MG/ML injection 0.5-1 mg  0.5-1 mg Intravenous Q2H PRN Chadwell, Joshua, PA-C   1 mg at 07/12/22 2017   ondansetron (ZOFRAN) tablet 4 mg  4 mg Oral Q6H PRN Chadwell, Joshua, PA-C       Or   ondansetron (ZOFRAN) injection 4 mg  4 mg Intravenous Q6H PRN Chadwell, Joshua, PA-C       polyethylene glycol (MIRALAX / GLYCOLAX) packet 17 g  17 g Oral BID Eugenie Filler, MD       senna-docusate (Senokot-S) tablet 1 tablet  1 tablet Oral BID Chadwell, Vonna Kotyk, PA-C   1 tablet at 07/15/22 0901   simvastatin (ZOCOR) tablet 20 mg  20 mg Oral QHS Chadwell, Joshua, PA-C   20 mg at 07/14/22 2127   sorbitol 70 % solution 30 mL  30 mL Oral Q3H Eugenie Filler, MD         Discharge Medications: Please see discharge summary for a list of discharge medications.  Relevant Imaging Results:  Relevant Lab Results:   Additional Information SS# [id number]  Lennart Pall, LCSW

## 2022-07-15 NOTE — TOC Progression Note (Signed)
Transition of Care (TOC) - Progression Note    Patient Details  Name: Mike Day MRN: [id number] Date of Birth: 11/24/1945  Transition of Care (TOC) CM/SW Contact  HOYLE, LUCY, LCSW Phone Number: 07/15/2022, 2:27 PM  Clinical Narrative:    Met with pt and family today and all agreed with plan for SNF rehab.  Bed search initiated and bed offer received/ accepted with [facility] who can admit pt on Monday.  Have alerted attending MD and ortho PA.   Expected Discharge Plan: Home w Home Health Services (vs. SNF) Barriers to Discharge: Continued Medical Work up  Expected Discharge Plan and Services Expected Discharge Plan: Home w Home Health Services (vs. SNF) In-house Referral: Clinical Social Work   Post Acute Care Choice: Home Health, Skilled Nursing Facility Living arrangements for the past 2 months: Single Family Home                                       Social Determinants of Health (SDOH) Interventions    Readmission Risk Interventions    07/13/2022    4:04 PM  Readmission Risk Prevention Plan  Post Dischage Appt Complete  Medication Screening Complete  Transportation Screening Complete    

## 2022-07-16 DIAGNOSIS — S72145A Nondisplaced intertrochanteric fracture of left femur, initial encounter for closed fracture: Secondary | ICD-10-CM | POA: Diagnosis not present

## 2022-07-16 DIAGNOSIS — I10 Essential (primary) hypertension: Secondary | ICD-10-CM | POA: Diagnosis not present

## 2022-07-16 DIAGNOSIS — I4821 Permanent atrial fibrillation: Secondary | ICD-10-CM | POA: Diagnosis not present

## 2022-07-16 DIAGNOSIS — S52022A Displaced fracture of olecranon process without intraarticular extension of left ulna, initial encounter for closed fracture: Secondary | ICD-10-CM | POA: Diagnosis not present

## 2022-07-16 LAB — CBC
HCT: 22.4 % — ABNORMAL LOW (ref 39.0–52.0)
Hemoglobin: 7.8 g/dL — ABNORMAL LOW (ref 13.0–17.0)
MCH: 35.1 pg — ABNORMAL HIGH (ref 26.0–34.0)
MCHC: 34.8 g/dL (ref 30.0–36.0)
MCV: 100.9 fL — ABNORMAL HIGH (ref 80.0–100.0)
Platelets: 176 10*3/uL (ref 150–400)
RBC: 2.22 MIL/uL — ABNORMAL LOW (ref 4.22–5.81)
RDW: 12.5 % (ref 11.5–15.5)
WBC: 6.6 10*3/uL (ref 4.0–10.5)
nRBC: 0 % (ref 0.0–0.2)

## 2022-07-16 LAB — BASIC METABOLIC PANEL
Anion gap: 4 — ABNORMAL LOW (ref 5–15)
BUN: 14 mg/dL (ref 8–23)
CO2: 29 mmol/L (ref 22–32)
Calcium: 8.3 mg/dL — ABNORMAL LOW (ref 8.9–10.3)
Chloride: 98 mmol/L (ref 98–111)
Creatinine, Ser: 0.58 mg/dL — ABNORMAL LOW (ref 0.61–1.24)
GFR, Estimated: >60 mL/min (ref >60)
Glucose, Bld: 132 mg/dL — ABNORMAL HIGH (ref 70–99)
Potassium: 4.2 mmol/L (ref 3.5–5.1)
Sodium: 131 mmol/L — ABNORMAL LOW (ref 135–145)

## 2022-07-16 LAB — MAGNESIUM: Magnesium: 2.2 mg/dL (ref 1.7–2.4)

## 2022-07-16 MED ORDER — SORBITOL 70 % SOLN
960.0000 mL | TOPICAL_OIL | Freq: Once | ORAL | Status: DC
  Filled 2022-07-16: qty 240

## 2022-07-16 MED ORDER — SORBITOL 70 % SOLN
30.0000 mL | Status: AC
  Administered 2022-07-16 (×2): 30 mL via ORAL
  Filled 2022-07-16 (×2): qty 30

## 2022-07-16 NOTE — Progress Notes (Signed)
PROGRESS NOTE    Mike Day  BTD:[id number] DOB: [birth year] DOA: 07/10/2022 PCP: Ma Hillock, DO    Chief Complaint  Patient presents with   Fall   Hip Pain    Brief Narrative:  PMH of chronic A-fib on Eliquis, prostate cancer, SP prostatectomy, HTN, HLD, type II DM presented with a mechanical fall. Found to have left hip fracture and left elbow fracture with hematoma. Orthopedics were consulted.  Patient will require surgical management for both fractures.  Underwent intramedullary nail placement on 11/7 for hip fracture on the left.    Assessment & Plan:  Principal Problem:   Hip fracture ([place]) Active Problems:   Pure hypercholesterolemia   Essential hypertension   Permanent atrial fibrillation (HCC)   Olecranon fracture, left, closed, initial encounter   Acute postoperative anemia due to expected blood loss   Closed nondisplaced intertrochanteric fracture of left femur (HCC)   Closed fracture of left olecranon process   Constipation    Assessment and Plan: * Hip fracture ([place]) Left intertrochanteric fx of left femur -Secondary to mechanical fall. -Continue to hold Eliquis.  -Patient seen in consultation by orthopedics and underwent femoral IM nail per Dr. Percell Miller 07/12/2022. -PT/ OT. -Likely needs SNF. -Pain management per orthopedics. -Per orthopedics.  Constipation -Patient with complaints of constipation. -Noted to have had some prune juice on 07/15/2022. -Continue MiraLAX twice daily, Senokot-S twice daily, Dulcolax suppository given with no BM.   -Sorbitol p.o. every 3 hours x2 doses if no results will give a Dulcolax suppository versus enema.    Acute postoperative anemia due to expected blood loss -Patient with noted left elbow hematoma. -Patient denies any overt bleeding. -Hemoglobin trending down and seems to be stabilizing currently at 7.8 today.  -Anemia panel consistent with anemia of chronic disease. -Follow H&H. -Transfusion threshold  hemoglobin < 7.   Olecranon fracture, left, closed, initial encounter - Left comminuted, mildly displaced fracture of proximal ulna and olecranon. -in long arm splint.  -Patient seen by orthopedics and patient status post ORIF left elbow per orthopedics 07/14/2022.  -PT/OT. -Per orthopedics.  Permanent atrial fibrillation (HCC) -Currently rate controlled.  -Eliquis resumed.  Essential hypertension -Continue home regimen Norvasc and lisinopril.   Pure hypercholesterolemia -Continue statin.   -Outpatient follow-up.         DVT prophylaxis: SCDs/postop DVT prophylaxis per orthopedics. Code Status: Full Family Communication: Updated patient.  No family at bedside. Disposition: Likely SNF on 07/18/2022.  Status is: Inpatient Remains inpatient appropriate because: Severity of illness.  Patient for surgery today, 07/14/2022   Consultants:  Orthopedics: Dr.Adair 07/10/2022 Orthopedics: Dr. Percell Miller 07/12/2022  Procedures:  CT left hip 07/11/2022 Plain films of the left elbow 07/10/2022 Plain films of the left hip and pelvis 07/10/2022, 07/12/2022 Plain films of the left knee 07/10/2022 Plain films of the left shoulder 07/10/2022 IM nail femoral, left 07/12/2022-Per orthopedics/Dr. Percell Miller ORIF left elbow per orthopedics: Dr. Percell Miller 07/14/2022  Antimicrobials:  Anti-infectives (From admission, onward)    Start     Dose/Rate Route Frequency Ordered Stop   07/15/22 0600  ceFAZolin (ANCEF) IVPB 2g/100 mL premix        2 g 200 mL/hr over 30 Minutes Intravenous On call to O.R. 07/14/22 1202 07/15/22 0630   07/12/22 1830  ceFAZolin (ANCEF) IVPB 2g/100 mL premix        2 g 200 mL/hr over 30 Minutes Intravenous Every 6 hours 07/12/22 1622 07/13/22 0030   07/12/22 1130  ceFAZolin (ANCEF) IVPB 2g/100 mL premix  2 g 200 mL/hr over 30 Minutes Intravenous On call to O.R. 07/12/22 1116 07/12/22 1242         Subjective: Sitting up in bed.  Stated did not have a bowel movement  yesterday after multiple laxatives.  Just got some sorbitol this morning.  Denies any chest pain, no shortness of breath, no abdominal pain, no bleeding noted.  RN at bedside.     Objective: Vitals:   07/16/22 0144 07/16/22 0605 07/16/22 0946 07/16/22 1259  BP: (!) 140/87 (!) 141/96 (!) 144/91 (!) 152/84  Pulse: 75 75 92 80  Resp: '18 18  17  '$ Temp: 97.9 F (36.6 C) 98 F (36.7 C)  97.7 F (36.5 C)  TempSrc: Oral Oral  Oral  SpO2: 100% 98%  99%  Weight:      Height:        Intake/Output Summary (Last 24 hours) at 07/16/2022 1636 Last data filed at 07/16/2022 1400 Gross per 24 hour  Intake 840 ml  Output 1325 ml  Net -485 ml   Filed Weights   07/10/22 2055 07/12/22 1125  Weight: 85.6 kg 85.6 kg    Examination:  General exam: NAD Respiratory system: Clear to auscultation bilaterally anterior lung fields.  No wheezes, no crackles, no rhonchi.  Fair air movement.  Speaking in full sentences. Cardiovascular system: Irregularly irregular.  No JVD, no murmurs rubs or gallops.  No lower extremity edema.   Gastrointestinal system: Abdomen soft, nontender, nondistended, positive bowel sounds.  No rebound.  No guarding.  Central nervous system: Alert and oriented. No focal neurological deficits. Extremities: LUE in splint and sling.  Left hip with postop dressing in place.  Skin: No rashes, lesions or ulcers Psychiatry: Judgement and insight appear normal. Mood & affect appropriate.     Data Reviewed:   CBC: Recent Labs  Lab 07/12/22 0805 07/12/22 1640 07/13/22 0352 07/14/22 0333 07/15/22 0854 07/16/22 0412  WBC 6.3 9.9 9.0 6.9 8.9 6.6  NEUTROABS 4.5  --   --  5.1 7.3  --   HGB 12.1* 12.7* 9.6* 8.3* 8.2* 7.8*  HCT 35.6* 37.4* 27.7* 24.1* 23.7* 22.4*  MCV 100.6* 102.2* 98.9 99.6 100.4* 100.9*  PLT 140* 136* 154 135* 169 657    Basic Metabolic Panel: Recent Labs  Lab 07/12/22 0805 07/12/22 1640 07/13/22 0352 07/14/22 0333 07/15/22 0854 07/16/22 0412  NA 133*   --  130* 133* 130* 131*  K 4.0  --  4.5 4.1 4.4 4.2  CL 97*  --  98 98 95* 98  CO2 28  --  '24 28 28 29  '$ GLUCOSE 124*  --  186* 215* 182* 132*  BUN 13  --  '14 14 13 14  '$ CREATININE 0.74 0.74 0.73 0.64 0.72 0.58*  CALCIUM 8.7*  --  8.2* 8.3* 8.5* 8.3*  MG  --   --   --   --  1.8 2.2    GFR: Estimated Creatinine Clearance: 93.9 mL/min (A) (by C-G formula based on SCr of 0.58 mg/dL (L)).  Liver Function Tests: Recent Labs  Lab 07/12/22 0805  AST 13*  ALT 10  ALKPHOS 47  BILITOT 1.1  PROT 6.0*  ALBUMIN 3.4*    CBG: No results for input(s): "GLUCAP" in the last 168 hours.   Recent Results (from the past 240 hour(s))  Surgical PCR screen     Status: None   Collection Time: 07/12/22  6:45 AM   Specimen: Nasal Mucosa; Nasal Swab  Result Value Ref Range  Status   MRSA, PCR NEGATIVE NEGATIVE Final   Staphylococcus aureus NEGATIVE NEGATIVE Final    Comment: (NOTE) The Xpert SA Assay (FDA approved for NASAL specimens in patients 61 years of age and older), is one component of a comprehensive surveillance program. It is not intended to diagnose infection nor to guide or monitor treatment. Performed at [facility], [place] [address] [place], [place] [address]   Culture, blood (Routine X 2) w Reflex to ID Panel     Status: None (Preliminary result)   Collection Time: 07/13/22  3:52 AM   Specimen: BLOOD  Result Value Ref Range Status   Specimen Description   Final    BLOOD BLOOD RIGHT HAND Performed at [facility] [address] [place], [place] [address]    Special Requests   Final    BOTTLES DRAWN AEROBIC AND ANAEROBIC Blood Culture adequate volume Performed at [facility] [address] [place], [place] [address]    Culture   Final    NO GROWTH 3 DAYS Performed at [facility], [place] [address] [place], [place] [address]    Report Status PENDING  Incomplete  Culture, blood (Routine X 2) w Reflex to ID  Panel     Status: None (Preliminary result)   Collection Time: 07/13/22  3:58 AM   Specimen: BLOOD  Result Value Ref Range Status   Specimen Description   Final    BLOOD BLOOD RIGHT HAND Performed at [facility] [address] [place], [place] [address]    Special Requests   Final    BOTTLES DRAWN AEROBIC AND ANAEROBIC Blood Culture adequate volume Performed at [facility] [address], [place], [place] [address]    Culture   Final    NO GROWTH 3 DAYS Performed at [facility], [place] [address] [place], [place] [address]    Report Status PENDING  Incomplete         Radiology Studies: No results found.      Scheduled Meds:  amLODipine  2.5 mg Oral Daily   apixaban  5 mg Oral BID   cholecalciferol  2,000 Units Oral Daily   lisinopril  20 mg Oral Daily   polyethylene glycol  17 g Oral BID   senna-docusate  1 tablet Oral BID   simvastatin  20 mg Oral QHS   Continuous Infusions:  methocarbamol (ROBAXIN) IV       LOS: 6 days    Time spent: 35 minutes    Irine Seal, MD [facility]   To contact the attending provider between 7A-7P or the covering provider during after hours 7P-7A, please log into the web site [email or web address] and access using universal [place] password for that web site. If you do not have the password, please call the hospital operator.  07/16/2022, 4:36 PM

## 2022-07-16 NOTE — Plan of Care (Signed)
  Problem: Education: Goal: Knowledge of General Education information will improve Description: Including pain rating scale, medication(s)/side effects and non-pharmacologic comfort measures Outcome: Progressing   Problem: Health Behavior/Discharge Planning: Goal: Ability to manage health-related needs will improve Outcome: Progressing   Problem: Activity: Goal: Risk for activity intolerance will decrease Outcome: Progressing   

## 2022-07-17 DIAGNOSIS — S72145A Nondisplaced intertrochanteric fracture of left femur, initial encounter for closed fracture: Secondary | ICD-10-CM | POA: Diagnosis not present

## 2022-07-17 DIAGNOSIS — S52022A Displaced fracture of olecranon process without intraarticular extension of left ulna, initial encounter for closed fracture: Secondary | ICD-10-CM | POA: Diagnosis not present

## 2022-07-17 DIAGNOSIS — I4821 Permanent atrial fibrillation: Secondary | ICD-10-CM | POA: Diagnosis not present

## 2022-07-17 DIAGNOSIS — I10 Essential (primary) hypertension: Secondary | ICD-10-CM | POA: Diagnosis not present

## 2022-07-17 LAB — BASIC METABOLIC PANEL
Anion gap: 7 (ref 5–15)
BUN: 14 mg/dL (ref 8–23)
CO2: 25 mmol/L (ref 22–32)
Calcium: 8.2 mg/dL — ABNORMAL LOW (ref 8.9–10.3)
Chloride: 98 mmol/L (ref 98–111)
Creatinine, Ser: 0.56 mg/dL — ABNORMAL LOW (ref 0.61–1.24)
GFR, Estimated: >60 mL/min (ref >60)
Glucose, Bld: 133 mg/dL — ABNORMAL HIGH (ref 70–99)
Potassium: 4 mmol/L (ref 3.5–5.1)
Sodium: 130 mmol/L — ABNORMAL LOW (ref 135–145)

## 2022-07-17 LAB — CBC
HCT: 24.2 % — ABNORMAL LOW (ref 39.0–52.0)
Hemoglobin: 8.3 g/dL — ABNORMAL LOW (ref 13.0–17.0)
MCH: 34.7 pg — ABNORMAL HIGH (ref 26.0–34.0)
MCHC: 34.3 g/dL (ref 30.0–36.0)
MCV: 101.3 fL — ABNORMAL HIGH (ref 80.0–100.0)
Platelets: 183 10*3/uL (ref 150–400)
RBC: 2.39 MIL/uL — ABNORMAL LOW (ref 4.22–5.81)
RDW: 12.8 % (ref 11.5–15.5)
WBC: 6.7 10*3/uL (ref 4.0–10.5)
nRBC: 0.3 % — ABNORMAL HIGH (ref 0.0–0.2)

## 2022-07-17 NOTE — Plan of Care (Signed)
  Problem: Education: Goal: Knowledge of General Education information will improve Description: Including pain rating scale, medication(s)/side effects and non-pharmacologic comfort measures Outcome: Progressing   Problem: Clinical Measurements: Goal: Will remain free from infection Outcome: Progressing   Problem: Activity: Goal: Risk for activity intolerance will decrease Outcome: Progressing   

## 2022-07-17 NOTE — Progress Notes (Signed)
PROGRESS NOTE    Mike Day  QAS:[id number] DOB: April 26, 1946 DOA: 07/10/2022 PCP: Ma Hillock, DO    Chief Complaint  Patient presents with   Fall   Hip Pain    Brief Narrative:  PMH of chronic A-fib on Eliquis, prostate cancer, SP prostatectomy, HTN, HLD, type II DM presented with a mechanical fall. Found to have left hip fracture and left elbow fracture with hematoma. Orthopedics were consulted.  Patient will require surgical management for both fractures.  Underwent intramedullary nail placement on 11/7 for hip fracture on the left.    Assessment & Plan:  Principal Problem:   Hip fracture ([place]) Active Problems:   Pure hypercholesterolemia   Essential hypertension   Permanent atrial fibrillation (HCC)   Olecranon fracture, left, closed, initial encounter   Acute postoperative anemia due to expected blood loss   Closed nondisplaced intertrochanteric fracture of left femur (HCC)   Closed fracture of left olecranon process   Constipation    Assessment and Plan: * Hip fracture ([place]) Left intertrochanteric fx of left femur -Secondary to mechanical fall. -Eliquis resumed. -Patient seen in consultation by orthopedics and underwent femoral IM nail per Dr. Percell Miller 07/12/2022. -PT/ OT. -Likely needs SNF. -Pain management per orthopedics. -Per orthopedics.  Constipation -Patient with complaints of constipation early on in the hospitalization. -Noted to have had some prune juice on 07/15/2022. -Patient stated had bowel movements with bowel regimen yesterday with the addition of sorbitol every 3 hours x2 doses. -Continue MiraLAX twice daily, Senokot-S twice daily.  Acute postoperative anemia due to expected blood loss -Patient with noted left elbow hematoma. -Patient denies any overt bleeding. -Hemoglobin trending down and seems to be stabilizing currently at 8.3 today.  -Anemia panel consistent with anemia of chronic disease. -Follow H&H. -Transfusion threshold  hemoglobin < 7.   Olecranon fracture, left, closed, initial encounter - Left comminuted, mildly displaced fracture of proximal ulna and olecranon. -in long arm splint.  -Patient seen by orthopedics and patient status post ORIF left elbow per orthopedics, 07/14/2022.  -PT/OT. -Per orthopedics.  Permanent atrial fibrillation (HCC) -Currently rate controlled.  -Continue Eliquis for anticoagulation.  Essential hypertension -Continue lisinopril, Norvasc.   Pure hypercholesterolemia -Continue statin.   -Outpatient follow-up.         DVT prophylaxis: SCDs/postop DVT prophylaxis per orthopedics. Code Status: Full Family Communication: Updated patient.  No family at bedside. Disposition: Likely SNF on 07/18/2022.  Status is: Inpatient Remains inpatient appropriate because: Severity of illness.  Patient for surgery today, 07/14/2022   Consultants:  Orthopedics: Dr.Adair 07/10/2022 Orthopedics: Dr. Percell Miller 07/12/2022  Procedures:  CT left hip 07/11/2022 Plain films of the left elbow 07/10/2022 Plain films of the left hip and pelvis 07/10/2022, 07/12/2022 Plain films of the left knee 07/10/2022 Plain films of the left shoulder 07/10/2022 IM nail femoral, left 07/12/2022-Per orthopedics/Dr. Percell Miller ORIF left elbow per orthopedics: Dr. Percell Miller 07/14/2022  Antimicrobials:  Anti-infectives (From admission, onward)    Start     Dose/Rate Route Frequency Ordered Stop   07/15/22 0600  ceFAZolin (ANCEF) IVPB 2g/100 mL premix        2 g 200 mL/hr over 30 Minutes Intravenous On call to O.R. 07/14/22 1202 07/15/22 0630   07/12/22 1830  ceFAZolin (ANCEF) IVPB 2g/100 mL premix        2 g 200 mL/hr over 30 Minutes Intravenous Every 6 hours 07/12/22 1622 07/13/22 0030   07/12/22 1130  ceFAZolin (ANCEF) IVPB 2g/100 mL premix  2 g 200 mL/hr over 30 Minutes Intravenous On call to O.R. 07/12/22 1116 07/12/22 1242         Subjective: Patient laying in bed.  No chest pain.  No shortness of  breath.  Stated had 2 large bowel movements yesterday after bowel regimen.    Objective: Vitals:   07/16/22 1259 07/16/22 2105 07/17/22 0629 07/17/22 1427  BP: (!) 152/84 123/70 126/82 123/71  Pulse: 80 73 76 91  Resp: '17 20 20 18  '$ Temp: 97.7 F (36.5 C) 98.3 F (36.8 C) 98.2 F (36.8 C) 98.1 F (36.7 C)  TempSrc: Oral Oral Oral   SpO2: 99% 97% 96% 100%  Weight:   47.9 kg   Height:        Intake/Output Summary (Last 24 hours) at 07/17/2022 1507 Last data filed at 07/17/2022 1000 Gross per 24 hour  Intake 300 ml  Output 200 ml  Net 100 ml   Filed Weights   07/10/22 2055 07/12/22 1125 07/17/22 0629  Weight: 85.6 kg 85.6 kg 47.9 kg    Examination:  General exam: NAD. Respiratory system: Lungs clear to auscultation bilaterally anterior lung fields.  No wheezes, no crackles, no rhonchi.  Fair air movement.  Speaking in full sentences.   Cardiovascular system: Irregularly irregular.  No JVD, no murmurs rubs or gallops.  No lower extremity edema.   Gastrointestinal system: Abdomen is soft, nontender, nondistended, positive bowel sounds.  No rebound.  No guarding.  Central nervous system: Alert and oriented. No focal neurological deficits. Extremities: LUE in splint and sling.  Left hip with postop dressing in place.  Skin: No rashes, lesions or ulcers Psychiatry: Judgement and insight appear normal. Mood & affect appropriate.     Data Reviewed:   CBC: Recent Labs  Lab 07/12/22 0805 07/12/22 1640 07/13/22 0352 07/14/22 0333 07/15/22 0854 07/16/22 0412 07/17/22 0338  WBC 6.3   < > 9.0 6.9 8.9 6.6 6.7  NEUTROABS 4.5  --   --  5.1 7.3  --   --   HGB 12.1*   < > 9.6* 8.3* 8.2* 7.8* 8.3*  HCT 35.6*   < > 27.7* 24.1* 23.7* 22.4* 24.2*  MCV 100.6*   < > 98.9 99.6 100.4* 100.9* 101.3*  PLT 140*   < > 154 135* 169 176 183   < > = values in this interval not displayed.    Basic Metabolic Panel: Recent Labs  Lab 07/13/22 0352 07/14/22 0333 07/15/22 0854  07/16/22 0412 07/17/22 0338  NA 130* 133* 130* 131* 130*  K 4.5 4.1 4.4 4.2 4.0  CL 98 98 95* 98 98  CO2 '24 28 28 29 25  '$ GLUCOSE 186* 215* 182* 132* 133*  BUN '14 14 13 14 14  '$ CREATININE 0.73 0.64 0.72 0.58* 0.56*  CALCIUM 8.2* 8.3* 8.5* 8.3* 8.2*  MG  --   --  1.8 2.2  --     GFR: Estimated Creatinine Clearance: 53.2 mL/min (A) (by C-G formula based on SCr of 0.56 mg/dL (L)).  Liver Function Tests: Recent Labs  Lab 07/12/22 0805  AST 13*  ALT 10  ALKPHOS 47  BILITOT 1.1  PROT 6.0*  ALBUMIN 3.4*    CBG: No results for input(s): "GLUCAP" in the last 168 hours.   Recent Results (from the past 240 hour(s))  Surgical PCR screen     Status: None   Collection Time: 07/12/22  6:45 AM   Specimen: Nasal Mucosa; Nasal Swab  Result Value Ref Range Status  MRSA, PCR NEGATIVE NEGATIVE Final   Staphylococcus aureus NEGATIVE NEGATIVE Final    Comment: (NOTE) The Xpert SA Assay (FDA approved for NASAL specimens in patients 67 years of age and older), is one component of a comprehensive surveillance program. It is not intended to diagnose infection nor to guide or monitor treatment. Performed at [facility], [place] [address], [place], [place] [address]   Culture, blood (Routine X 2) w Reflex to ID Panel     Status: None (Preliminary result)   Collection Time: 07/13/22  3:52 AM   Specimen: BLOOD  Result Value Ref Range Status   Specimen Description   Final    BLOOD BLOOD RIGHT HAND Performed at [facility] [address], [place], [place] [address]    Special Requests   Final    BOTTLES DRAWN AEROBIC AND ANAEROBIC Blood Culture adequate volume Performed at [facility] [address] [place], [place] [address]    Culture   Final    NO GROWTH 4 DAYS Performed at [facility], [place] [address], [place], [place] [address]    Report Status PENDING  Incomplete  Culture, blood (Routine X 2) w Reflex to ID Panel      Status: None (Preliminary result)   Collection Time: 07/13/22  3:58 AM   Specimen: BLOOD  Result Value Ref Range Status   Specimen Description   Final    BLOOD BLOOD RIGHT HAND Performed at [facility] [address], [place], [place] [address]    Special Requests   Final    BOTTLES DRAWN AEROBIC AND ANAEROBIC Blood Culture adequate volume Performed at [facility] [address], [place], [place] [address]    Culture   Final    NO GROWTH 4 DAYS Performed at [facility], [place] [address], [place], [place] [address]    Report Status PENDING  Incomplete         Radiology Studies: No results found.      Scheduled Meds:  amLODipine  2.5 mg Oral Daily   apixaban  5 mg Oral BID   cholecalciferol  2,000 Units Oral Daily   lisinopril  20 mg Oral Daily   polyethylene glycol  17 g Oral BID   senna-docusate  1 tablet Oral BID   simvastatin  20 mg Oral QHS   Continuous Infusions:  methocarbamol (ROBAXIN) IV       LOS: 7 days    Time spent: 35 minutes    Irine Seal, MD [facility]   To contact the attending provider between 7A-7P or the covering provider during after hours 7P-7A, please log into the web site [email or web address] and access using universal [place] password for that web site. If you do not have the password, please call the hospital operator.  07/17/2022, 3:07 PM

## 2022-07-18 ENCOUNTER — Ambulatory Visit: Payer: Medicare Other | Admitting: Family Medicine

## 2022-07-18 DIAGNOSIS — Z8546 Personal history of malignant neoplasm of prostate: Secondary | ICD-10-CM | POA: Diagnosis not present

## 2022-07-18 DIAGNOSIS — D62 Acute posthemorrhagic anemia: Secondary | ICD-10-CM | POA: Diagnosis not present

## 2022-07-18 DIAGNOSIS — E559 Vitamin D deficiency, unspecified: Secondary | ICD-10-CM | POA: Diagnosis not present

## 2022-07-18 DIAGNOSIS — N529 Male erectile dysfunction, unspecified: Secondary | ICD-10-CM | POA: Diagnosis not present

## 2022-07-18 DIAGNOSIS — D638 Anemia in other chronic diseases classified elsewhere: Secondary | ICD-10-CM | POA: Diagnosis not present

## 2022-07-18 DIAGNOSIS — I4821 Permanent atrial fibrillation: Secondary | ICD-10-CM | POA: Diagnosis not present

## 2022-07-18 DIAGNOSIS — Z7401 Bed confinement status: Secondary | ICD-10-CM | POA: Diagnosis not present

## 2022-07-18 DIAGNOSIS — E785 Hyperlipidemia, unspecified: Secondary | ICD-10-CM | POA: Diagnosis not present

## 2022-07-18 DIAGNOSIS — S52022A Displaced fracture of olecranon process without intraarticular extension of left ulna, initial encounter for closed fracture: Secondary | ICD-10-CM | POA: Diagnosis not present

## 2022-07-18 DIAGNOSIS — E538 Deficiency of other specified B group vitamins: Secondary | ICD-10-CM | POA: Diagnosis not present

## 2022-07-18 DIAGNOSIS — D500 Iron deficiency anemia secondary to blood loss (chronic): Secondary | ICD-10-CM | POA: Diagnosis not present

## 2022-07-18 DIAGNOSIS — S72142A Displaced intertrochanteric fracture of left femur, initial encounter for closed fracture: Secondary | ICD-10-CM | POA: Diagnosis not present

## 2022-07-18 DIAGNOSIS — D649 Anemia, unspecified: Secondary | ICD-10-CM | POA: Diagnosis not present

## 2022-07-18 DIAGNOSIS — S72145A Nondisplaced intertrochanteric fracture of left femur, initial encounter for closed fracture: Secondary | ICD-10-CM | POA: Diagnosis not present

## 2022-07-18 DIAGNOSIS — I482 Chronic atrial fibrillation, unspecified: Secondary | ICD-10-CM | POA: Diagnosis not present

## 2022-07-18 DIAGNOSIS — M7022 Olecranon bursitis, left elbow: Secondary | ICD-10-CM | POA: Diagnosis not present

## 2022-07-18 DIAGNOSIS — M8580 Other specified disorders of bone density and structure, unspecified site: Secondary | ICD-10-CM | POA: Diagnosis not present

## 2022-07-18 DIAGNOSIS — S52022D Displaced fracture of olecranon process without intraarticular extension of left ulna, subsequent encounter for closed fracture with routine healing: Secondary | ICD-10-CM | POA: Diagnosis not present

## 2022-07-18 DIAGNOSIS — S72145D Nondisplaced intertrochanteric fracture of left femur, subsequent encounter for closed fracture with routine healing: Secondary | ICD-10-CM | POA: Diagnosis not present

## 2022-07-18 DIAGNOSIS — I700 Atherosclerosis of aorta: Secondary | ICD-10-CM | POA: Diagnosis not present

## 2022-07-18 DIAGNOSIS — I10 Essential (primary) hypertension: Secondary | ICD-10-CM | POA: Diagnosis not present

## 2022-07-18 DIAGNOSIS — E1140 Type 2 diabetes mellitus with diabetic neuropathy, unspecified: Secondary | ICD-10-CM | POA: Diagnosis not present

## 2022-07-18 DIAGNOSIS — E7800 Pure hypercholesterolemia, unspecified: Secondary | ICD-10-CM | POA: Diagnosis not present

## 2022-07-18 DIAGNOSIS — M25552 Pain in left hip: Secondary | ICD-10-CM | POA: Diagnosis not present

## 2022-07-18 DIAGNOSIS — W19XXXA Unspecified fall, initial encounter: Secondary | ICD-10-CM | POA: Diagnosis not present

## 2022-07-18 DIAGNOSIS — E875 Hyperkalemia: Secondary | ICD-10-CM | POA: Diagnosis not present

## 2022-07-18 DIAGNOSIS — M79609 Pain in unspecified limb: Secondary | ICD-10-CM | POA: Diagnosis not present

## 2022-07-18 DIAGNOSIS — Z7901 Long term (current) use of anticoagulants: Secondary | ICD-10-CM | POA: Diagnosis not present

## 2022-07-18 DIAGNOSIS — R531 Weakness: Secondary | ICD-10-CM | POA: Diagnosis not present

## 2022-07-18 DIAGNOSIS — I4892 Unspecified atrial flutter: Secondary | ICD-10-CM | POA: Diagnosis not present

## 2022-07-18 DIAGNOSIS — R5383 Other fatigue: Secondary | ICD-10-CM | POA: Diagnosis not present

## 2022-07-18 DIAGNOSIS — K5900 Constipation, unspecified: Secondary | ICD-10-CM | POA: Diagnosis not present

## 2022-07-18 DIAGNOSIS — W19XXXD Unspecified fall, subsequent encounter: Secondary | ICD-10-CM | POA: Diagnosis not present

## 2022-07-18 DIAGNOSIS — E871 Hypo-osmolality and hyponatremia: Secondary | ICD-10-CM | POA: Diagnosis not present

## 2022-07-18 DIAGNOSIS — G8918 Other acute postprocedural pain: Secondary | ICD-10-CM | POA: Diagnosis not present

## 2022-07-18 DIAGNOSIS — S72002D Fracture of unspecified part of neck of left femur, subsequent encounter for closed fracture with routine healing: Secondary | ICD-10-CM | POA: Diagnosis not present

## 2022-07-18 DIAGNOSIS — Z7409 Other reduced mobility: Secondary | ICD-10-CM | POA: Diagnosis not present

## 2022-07-18 DIAGNOSIS — G629 Polyneuropathy, unspecified: Secondary | ICD-10-CM | POA: Diagnosis not present

## 2022-07-18 LAB — CBC
HCT: 24.7 % — ABNORMAL LOW (ref 39.0–52.0)
Hemoglobin: 8.3 g/dL — ABNORMAL LOW (ref 13.0–17.0)
MCH: 34 pg (ref 26.0–34.0)
MCHC: 33.6 g/dL (ref 30.0–36.0)
MCV: 101.2 fL — ABNORMAL HIGH (ref 80.0–100.0)
Platelets: 225 10*3/uL (ref 150–400)
RBC: 2.44 MIL/uL — ABNORMAL LOW (ref 4.22–5.81)
RDW: 12.9 % (ref 11.5–15.5)
WBC: 8 10*3/uL (ref 4.0–10.5)
nRBC: 0 % (ref 0.0–0.2)

## 2022-07-18 LAB — CULTURE, BLOOD (ROUTINE X 2)
Culture: NO GROWTH
Culture: NO GROWTH
Special Requests: ADEQUATE
Special Requests: ADEQUATE

## 2022-07-18 MED ORDER — ZOLPIDEM TARTRATE 5 MG PO TABS: 5.0000 mg | ORAL_TABLET | Freq: Every evening | ORAL | 0 refills | Status: DC | PRN

## 2022-07-18 MED ORDER — POLYETHYLENE GLYCOL 3350 17 G PO PACK: 17.0000 g | PACK | Freq: Two times a day (BID) | ORAL | 0 refills | Status: DC

## 2022-07-18 MED ORDER — HYDROCODONE-ACETAMINOPHEN 5-325 MG PO TABS: 1.0000 | ORAL_TABLET | ORAL | 0 refills | Status: DC | PRN

## 2022-07-18 MED ORDER — SENNOSIDES-DOCUSATE SODIUM 8.6-50 MG PO TABS: 1.0000 | ORAL_TABLET | Freq: Two times a day (BID) | ORAL | Status: DC

## 2022-07-18 NOTE — Progress Notes (Signed)
Patient ready for discharge to [facility] skilled facility; IV removed; discharge instructions explained to patient and wife; all questions answered; discharge instructions placed in packet for transfer with [facility]; will continue to monitor

## 2022-07-18 NOTE — Progress Notes (Signed)
Subjective: Patient reports pain as mild to moderate.  Tolerating diet.  Urinating.   No CP, SOB.  Has mobilized some OOB with PT/OT.  Objective:   VITALS:   Vitals:   07/17/22 1427 07/17/22 2215 07/18/22 0626 07/18/22 1124  BP: 123/71 137/69 118/68 112/71  Pulse: 91 81 77 78  Resp: '18 17 17 15  '$ Temp: 98.1 F (36.7 C) 98.3 F (36.8 C) 97.9 F (36.6 C) 98.1 F (36.7 C)  TempSrc:   Oral Oral  SpO2: 100% 100% 95% 99%  Weight:      Height:          Latest Ref Rng & Units 07/18/2022    3:28 AM 07/17/2022    3:38 AM 07/16/2022    4:12 AM  CBC  WBC 4.0 - 10.5 K/uL 8.0  6.7  6.6   Hemoglobin 13.0 - 17.0 g/dL 8.3  8.3  7.8   Hematocrit 39.0 - 52.0 % 24.7  24.2  22.4   Platelets 150 - 400 K/uL 225  183  176       Latest Ref Rng & Units 07/17/2022    3:38 AM 07/16/2022    4:12 AM 07/15/2022    8:54 AM  BMP  Glucose 70 - 99 mg/dL 133  132  182   BUN 8 - 23 mg/dL '14  14  13   '$ Creatinine 0.61 - 1.24 mg/dL 0.56  0.58  0.72   Sodium 135 - 145 mmol/L 130  131  130   Potassium 3.5 - 5.1 mmol/L 4.0  4.2  4.4   Chloride 98 - 111 mmol/L 98  98  95   CO2 22 - 32 mmol/L '25  29  28   '$ Calcium 8.9 - 10.3 mg/dL 8.2  8.3  8.5    Intake/Output      11/12 0701 11/13 0700 11/13 0701 11/14 0700   P.O. 390    IV Piggyback 0    Total Intake(mL/kg) 390 (8.1)    Urine (mL/kg/hr) 850 (0.7)    Stool 0    Total Output 850    Net -460         Urine Occurrence 0 x    Stool Occurrence 0 x       Physical Exam: General: NAD.   Resp: No increased wob Cardio: regular rate and rhythm ABD soft Neurologically intact MSK Neurovascularly intact Sensation intact distally Intact pulses distally Dorsiflexion/Plantar flexion intact Incision: dressing C/D/I Splint c/d/i   Assessment: 4 Days Post-Op  S/P Procedure(s) (LRB): OPEN REDUCTION INTERNAL FIXATION (ORIF) OF LEFT ELBOW (Left) by Dr. Ernesta Amble. Percell Miller on 07/14/22  Principal Problem:   Hip fracture ([place]) Active Problems:    Pure hypercholesterolemia   Essential hypertension   Permanent atrial fibrillation (HCC)   Olecranon fracture, left, closed, initial encounter   Acute postoperative anemia due to expected blood loss   Closed nondisplaced intertrochanteric fracture of left femur (HCC)   Closed fracture of left olecranon process   Constipation   Plan:  Advance diet Up with therapy Discharge to SNF Incentive Spirometry Elevate and Apply ice  Weightbearing: WBAT LLE, WB through platform walker LUE Insicional and dressing care: Dressings left intact until follow-up and Reinforce dressings as needed Orthopedic device(s): Splint and and sling Showering: Keep dressing dry VTE prophylaxis:  on daily Eliquis  , SCDs, ambulation Pain control: Norco PRN Follow - up plan: 2 weeks Contact information:  Edmonia Lynch MD, Aggie Moats PA-C  Dispo: Skilled Nursing  Facility/Rehab today. Pain medicine script printed and signed and placed in chart.      Britt Bottom, PA-C Office [phone] 07/18/2022, 1:22 PM

## 2022-07-18 NOTE — Progress Notes (Signed)
Physical Therapy Treatment Patient Details Name: Mike Day MRN: [id number] DOB: Aug 24, 1946 Today's Date: 07/18/2022   History of Present Illness Pt is a 76 year old male admitted after fall and found to have left hip fracture and left elbow fracture with hematoma.  Pt s/p L IM nail 07/12/22 and pending left elbow ORIF 07/14/22. PMH of chronic A-fib on Eliquis, prostate cancer, SP prostatectomy, HTN, HLD, type II DM    PT Comments    POD # 4 L UE IM Nail POD # 6 L hip IM Nail Pt was OOB in recliner.  General transfer comment: 75% VC's on proper R UE hand placement.  Required + 2 MAX Assit side to side to rise from recliner onto B Platform EVA walker. Great difficulty self rising.   Third assist following with recliner.  VERY difficult to self rise and lower with L UE in hard cast fixed at elbow.  General Gait Details: using EVA walker and + 3 asisst, pt was able to amba short distance but with much effort, 75% VC's and Therapist "pushing" from behind to initiate steppage.  Pt tolerated FWB L LE however posture was poor and L LE steps were limited/difficul to self advance.  Third assist folllowing with recliner. Pt will need ST Rehab at SNF to address mobility and functional decline prior to safely returning home.   Recommendations for follow up therapy are one component of a multi-disciplinary discharge planning process, led by the attending physician.  Recommendations may be updated based on patient status, additional functional criteria and insurance authorization.  Follow Up Recommendations  Skilled nursing-short term rehab (<3 hours/day)     Assistance Recommended at Discharge Frequent or constant Supervision/Assistance  Patient can return home with the following Two people to help with walking and/or transfers;Two people to help with bathing/dressing/bathroom;Assist for transportation;Help with stairs or ramp for entrance;Assistance with cooking/housework   Equipment Recommendations        Recommendations for Other Services       Precautions / Restrictions Precautions Precautions: Fall Precaution Comments: Pt has had many recent falls. Hx Neropathy. Restrictions Weight Bearing Restrictions: No LUE Weight Bearing: Non weight bearing LLE Weight Bearing: Weight bearing as tolerated Other Position/Activity Restrictions: may WB throught L elbow     Mobility  Bed Mobility               General bed mobility comments: OOB in recliner    Transfers Overall transfer level: Needs assistance Equipment used: Rolling walker (2 wheels) Transfers: Sit to/from Stand, Bed to chair/wheelchair/BSC Sit to Stand: Max assist, +2 physical assistance, From elevated surface, +2 safety/equipment           General transfer comment: 75% VC's on proper R UE hand placement.  Required + 2 MAX Assit side to side to rise from recliner onto B Platform EVA walker. Great difficulty self rising.   Third assist following with recliner.  VERY difficult to self rise and lower with L UE in hard cast fixed at elbow.    Ambulation/Gait Ambulation/Gait assistance: Max Web designer (Feet): 22 Feet Assistive device: Bilateral platform walker, Ethelene Hal Gait Pattern/deviations: Step-to pattern, Decreased step length - left, Decreased stance time - left, Scissoring, Trunk flexed, Narrow base of support Gait velocity: decreased     General Gait Details: using EVA walker and + 3 asisst, pt was able to amba short distance but with much effort, 75% VC's and Therapist "pushing" from behind to initiate steppage.  Pt tolerated FWB  L LE however posture was poor and L LE steps were limited/difficul to self advance.  Third assist folllowing with recliner.   Stairs             Wheelchair Mobility    Modified Rankin (Stroke Patients Only)       Balance                                            Cognition Arousal/Alertness: Awake/alert Behavior During Therapy:  WFL for tasks assessed/performed Overall Cognitive Status: Within Functional Limits for tasks assessed                                 General Comments: AxO x 3 nervous about taking steps        Exercises      General Comments        Pertinent Vitals/Pain Pain Assessment Pain Assessment: 0-10 Pain Score: 8  Pain Location: L elbow and L hip Pain Descriptors / Indicators: Grimacing, Sore, Operative site guarding Pain Intervention(s): Monitored during session, Premedicated before session, Repositioned, Ice applied    Home Living                          Prior Function            PT Goals (current goals can now be found in the care plan section) Progress towards PT goals: Progressing toward goals    Frequency    Min 3X/week      PT Plan Current plan remains appropriate    Co-evaluation              AM-PAC PT "6 Clicks" Mobility   Outcome Measure  Help needed turning from your back to your side while in a flat bed without using bedrails?: A Lot Help needed moving from lying on your back to sitting on the side of a flat bed without using bedrails?: A Lot Help needed moving to and from a bed to a chair (including a wheelchair)?: A Lot Help needed standing up from a chair using your arms (e.g., wheelchair or bedside chair)?: A Lot Help needed to walk in hospital room?: Total Help needed climbing 3-5 steps with a railing? : Total 6 Click Score: 10    End of Session Equipment Utilized During Treatment: Gait belt Activity Tolerance: Patient limited by fatigue;Patient limited by pain Patient left: with call bell/phone within reach;with bed alarm set;with family/visitor present;with nursing/sitter in room;in chair;with chair alarm set Nurse Communication: Mobility status PT Visit Diagnosis: Other abnormalities of gait and mobility (R26.89);Pain Pain - Right/Left: Left     Time: 5027-7412 PT Time Calculation (min) (ACUTE ONLY): 25  min  Charges:  $Gait Training: 8-22 mins $Therapeutic Activity: 8-22 mins                     Rica Koyanagi  PTA Acute  Rehabilitation Services Office M-F          [phone] Weekend pager [phone]

## 2022-07-18 NOTE — TOC Transition Note (Signed)
Transition of Care [facility]) - CM/SW Discharge Note   Patient Details  Name: Mike Day MRN: [id number] Date of Birth: [birth year]  [facility] CM/SW Contact:  Lennart Pall, LCSW Phone Number: 07/18/2022, 2:09 PM   Clinical Narrative:    Pt medically cleared for dc today and bed ready at [facility].  Pt and wife aware and agreeable.  PTAR called at 2pm.  RN to call report to [phone].  No further TOC needs.   Final next level of care: Skilled Nursing Facility Barriers to Discharge: Barriers Resolved   Patient Goals and CMS Choice Patient states their goals for this hospitalization and ongoing recovery are:: return home      Discharge Placement PASRR number recieved: 07/15/22            Patient chooses bed at: [facility] Patient to be transferred to facility by: [place] Name of family member notified: wife Patient and family notified of of transfer: 07/18/22  Discharge Plan and Services In-house Referral: Clinical Social Work   Post Acute Care Choice: Home Health, [place]          DME Arranged: N/A DME Agency: NA                  Social Determinants of Health ([place]) Interventions     Readmission Risk Interventions    07/13/2022    4:04 PM  Readmission Risk Prevention Plan  Post Dischage Appt Complete  Medication Screening Complete  Transportation Screening Complete

## 2022-07-18 NOTE — Plan of Care (Signed)

## 2022-07-18 NOTE — Op Note (Signed)
07/14/2022  1:12 PM  PATIENT:  Mike Day    PRE-OPERATIVE DIAGNOSIS:  LEFT ELBOW FRACTURE  POST-OPERATIVE DIAGNOSIS:  Same  PROCEDURE:  OPEN REDUCTION INTERNAL FIXATION (ORIF) OF LEFT ELBOW  SURGEON:  [name], MD  ASSISTANT: Aggie Moats, PA-C, he was present and scrubbed throughout the case, critical for completion in a timely fashion, and for retraction, instrumentation, and closure.   ANESTHESIA:   gen  PREOPERATIVE INDICATIONS:  Mike Day is a  76 y.o. male with a diagnosis of LEFT ELBOW FRACTURE who failed conservative measures and elected for surgical management.    The risks benefits and alternatives were discussed with the patient preoperatively including but not limited to the risks of infection, bleeding, nerve injury, cardiopulmonary complications, the need for revision surgery, among others, and the patient was willing to proceed.  OPERATIVE IMPLANTS: tension band with K wires and Fibertape  OPERATIVE FINDINGS: unstable olecranon fracture  BLOOD LOSS: minimal  COMPLICATIONS: non  TOURNIQUET TIME: see operative report  OPERATIVE PROCEDURE:  Patient was identified in the preoperative holding area and site was marked by me He was transported to the operating theater and placed on the table in lateral position taking care to pad all bony prominences. After a preincinduction time out anesthesia was induced. The left upper extremity was prepped and draped in normal sterile fashion and a pre-incision timeout was performed. He received ancef for preoperative antibiotics.   I made an incision directly over the olecranon fracture.  Ulnar nerve was protected throughout.  I dissected down to the periosteum and this was incised over the fracture.  I then removed any soft tissue from within the fracture and reduce this fracture I placed a lag screw and was happy with the reduction  Next I placed a hook plate with solid fixaion    I examined 4 views of the  olecranon with fluoroscopy was happy with the fracture reduction and hardware alignment thorough irrigation was performed prior to pinning of the joint and of the wound.  Skin was then closed in layers sterile dressing and a long-arm splint was applied patient was awoken and taken to the PACU in stable condition  POST OPERATIVE PLAN: sling full time. Dvt px: mobilize and chemical when indicated. Ok to use platform walker

## 2022-07-18 NOTE — Discharge Summary (Signed)
Physician Discharge Summary  CHAOS Day NTI:[id number] DOB: 1946/01/07 DOA: 07/10/2022  PCP: Mike Hillock, DO  Admit date: 07/10/2022 Discharge date: 07/18/2022  Time spent: 60 minutes  Recommendations for Outpatient Follow-up:  Follow-up with MD at skilled nursing facility.  Patient will need a CBC done to follow-up on hemoglobin in 1 week as well as a basic metabolic profile done to follow-up on electrolytes and renal function. Follow-up with Dr. Edmonia Lynch in 2 weeks.   Discharge Diagnoses:  Principal Problem:   Hip fracture ([place]) Active Problems:   Pure hypercholesterolemia   Essential hypertension   Permanent atrial fibrillation (HCC)   Olecranon fracture, left, closed, initial encounter   Acute postoperative anemia due to expected blood loss   Closed nondisplaced intertrochanteric fracture of left femur (HCC)   Closed fracture of left olecranon process   Constipation   Discharge Condition: Stable and improved  Diet recommendation: Regular  Filed Weights   07/10/22 2055 07/12/22 1125 07/17/22 0629  Weight: 85.6 kg 85.6 kg 47.9 kg    History of present illness:  HPI per Dr. Doristine Day is a 76 y.o. male with medical history significant of Permanent a.fib on Eliquis,  prostate cancer s/p prostatectomy, HTN, HLD, DM who presents after a mechanical fall.    Pt was working the garden today and tripped and fell down a 41f garden bed. Landed on left side and unable to get up. Had pain to Elbow and LE. Did not hit head. Took last dose Eliquis this morning.  Has had several recurrent falls in the past year but are all mechanical.  He has peripheral neuropathy.   In the ED, he was afebrile, BP up to 178/106.   WBC of 10, Hgb of 13.5. Plt of 163 Na of 135, K of 4.3, glucose of 153, creatinine of 0.86   Left comminuted, mildly displaced fracture of proximal ulna and olecranon. Left hip X-ray with interval deformity in the intertrochanteric of left femur  suggesting recent fracture.  Hospital Course:   Assessment and Plan: * Hip fracture ([place] Left intertrochanteric fx of left femur -Secondary to mechanical fall. -Eliquis resumed postoperatively and when patient was cleared by orthopedics.. -Patient seen in consultation by orthopedics and underwent femoral IM nail per Dr. MPercell Miller11/03/2022. -PT/ OT assessed patient and recommended SNF. -Pain management per orthopedics. -Outpatient follow-up with orthopedics.  Constipation -Patient with complaints of constipation early on in the hospitalization. -Noted to have had some prune juice on 07/15/2022. -Patient stated had bowel movements with bowel regimen of MiraLAX twice daily, Senokot-S twice daily, with the addition of sorbitol every 3 hours x2 doses. -Patient was discharged on MiraLAX twice daily, Senokot-S twice daily.   -Outpatient follow-up.    Acute postoperative anemia due to expected blood loss -Patient with noted left elbow hematoma. -Patient denied any overt bleeding. -Hemoglobin trended down initially but stabilized at 8.3 by day of discharge.  -Anemia panel consistent with anemia of chronic disease. -Outpatient follow-up.  Olecranon fracture, left, closed, initial encounter - Left comminuted, mildly displaced fracture of proximal ulna and olecranon. -in long arm splint.  -Patient seen by orthopedics and patient status post ORIF left elbow per orthopedics, 07/14/2022.  -Patient followed by PT/OT and patient will be discharged to skilled nursing facility.  -Outpatient follow-up with orthopedics.    Permanent atrial fibrillation (HCC) -Remained rate controlled during the hospitalization.   -His Eliquis was held preoperatively and subsequently resumed.  Once patient was cleared by orthopedics.  Essential hypertension -Patient's lisinopril initially held during the hospitalization patient maintained on Norvasc.   -As blood pressure improved lisinopril was resumed and patient's  blood pressure was controlled on home regimen of Norvasc and lisinopril.   -Outpatient follow-up.   Pure hypercholesterolemia -Patient maintained on home regimen statin.  -Outpatient follow-up.        Procedures: CT left hip 07/11/2022 Plain films of the left elbow 07/10/2022 Plain films of the left hip and pelvis 07/10/2022, 07/12/2022 Plain films of the left knee 07/10/2022 Plain films of the left shoulder 07/10/2022 IM nail femoral, left 07/12/2022-Per orthopedics/Dr. Percell Miller ORIF left elbow per orthopedics: Dr. Percell Miller 07/14/2022  Consultations: Orthopedics: Dr.Adair 07/10/2022 Orthopedics: Dr. Percell Miller 07/12/2022  Discharge Exam: Vitals:   07/18/22 0626 07/18/22 1124  BP: 118/68 112/71  Pulse: 77 78  Resp: 17 15  Temp: 97.9 F (36.6 C) 98.1 F (36.7 C)  SpO2: 95% 99%    General: NAD Cardiovascular: Irregularly irregular, no murmurs rubs or gallops.  No JVD.  No lower extremity edema. Respiratory: Lungs clear to auscultation bilaterally.  No wheezes, no crackles, no rhonchi.  Fair air movement.  No use of accessory muscles of respiration. Extremities: Left upper extremity in splint and sling.  Left hip with postop dressing in place.  Discharge Instructions   Discharge Instructions     Diet general   Complete by: As directed    Increase activity slowly   Complete by: As directed    No wound care   Complete by: As directed       Allergies as of 07/18/2022       Reactions   Levofloxacin Rash   Neosporin [neomycin-bacitracin Zn-polymyx] Other (See Comments)   Dry/scaly skin/blistering    Tape Other (See Comments)   ADHESIVE TAPE; BLISTERS,        Medication List     STOP taking these medications    doxycycline 100 MG tablet Commonly known as: VIBRA-TABS   tizanidine 2 MG capsule Commonly known as: Zanaflex       TAKE these medications    acetaminophen 500 MG tablet Commonly known as: TYLENOL Take 1,000 mg by mouth every 8 (eight) hours as needed  for moderate pain.   alendronate 70 MG tablet Commonly known as: FOSAMAX Take 1 tablet (70 mg total) by mouth every 7 (seven) days. Take with a full glass of water on an empty stomach.   amLODipine 2.5 MG tablet Commonly known as: NORVASC Take 1 tablet (2.5 mg total) by mouth daily.   apixaban 5 MG Tabs tablet Commonly known as: Eliquis Take 1 tablet (5 mg total) by mouth 2 (two) times daily.   ARTIFICIAL TEAR SOLUTION OP Place 1 drop into both eyes daily as needed (dry eyes).   cetaphil cream Apply 1 application topically daily.   cholecalciferol 25 MCG (1000 UNIT) tablet Commonly known as: VITAMIN D3 Take 2 tablets (2,000 Units total) by mouth daily.   Cyanocobalamin 1000 MCG Subl 1000 mg sublingual daily. What changed:  how much to take how to take this when to take this additional instructions   HYDROcodone-acetaminophen 5-325 MG tablet Commonly known as: NORCO/VICODIN Take 1-2 tablets by mouth every 4 (four) hours as needed for moderate pain or severe pain.   lisinopril 20 MG tablet Commonly known as: ZESTRIL Take 1 tablet (20 mg total) by mouth daily.   polyethylene glycol 17 g packet Commonly known as: MIRALAX / GLYCOLAX Take 17 g by mouth 2 (two) times daily.   senna-docusate  8.6-50 MG tablet Commonly known as: Senokot-S Take 1 tablet by mouth 2 (two) times daily.   simvastatin 20 MG tablet Commonly known as: ZOCOR Take 1 tablet (20 mg total) by mouth at bedtime.   triamcinolone 55 MCG/ACT Aero nasal inhaler Commonly known as: NASACORT Place 1 spray into the nose daily as needed (allergies).   zolpidem 5 MG tablet Commonly known as: AMBIEN Take 1 tablet (5 mg total) by mouth at bedtime as needed for sleep.       Allergies  Allergen Reactions   Levofloxacin Rash   Neosporin [Neomycin-Bacitracin Zn-Polymyx] Other (See Comments)    Dry/scaly skin/blistering    Tape Other (See Comments)    ADHESIVE TAPE; BLISTERS,    Follow-up Information      MD AT SNF Follow up.          [name], MD. Schedule an appointment as soon as possible for a visit in 2 week(s).   Specialty: Orthopedic Surgery Contact information: [address] [place] [address] [place] [address] [phone]                  The results of significant diagnostics from this hospitalization (including imaging, microbiology, ancillary and laboratory) are listed below for reference.    Significant Diagnostic Studies: DG Hip Port Unilat With Pelvis 1V Left  Result Date: 07/12/2022 CLINICAL DATA:  Postop ORIF EXAM: DG HIP (WITH OR WITHOUT PELVIS) 1V PORT LEFT COMPARISON:  CT left hip 07/11/2022 FINDINGS: Intertrochanteric fracture has been fixed with a locking intramedullary rod and threaded screw extending into the femoral head. Fracture in good alignment. Hardware in satisfactory position. IMPRESSION: Satisfactory ORIF left intertrochanteric fracture. Electronically Signed   By: Franchot Gallo M.D.   On: 07/12/2022 14:58   DG HIP UNILAT WITH PELVIS 2-3 VIEWS LEFT  Result Date: 07/12/2022 CLINICAL DATA:  Fracture fixation. EXAM: DG HIP (WITH OR WITHOUT PELVIS) 2-3V LEFT COMPARISON:  Preoperative imaging. FINDINGS: Six fluoroscopic spot views of the left hip obtained in the operating room. Intramedullary nail with trans trochanteric and distal locking screw fixation. The known proximal femur fracture is not well demonstrated on these fluoroscopic spot views. Fluoroscopy time 34 seconds. Dose 2.2847 mGy. IMPRESSION: Fluoroscopic spot views during ORIF of left proximal femur fracture. Electronically Signed   By: Keith Rake M.D.   On: 07/12/2022 14:06   DG C-Arm 1-60 Min-No Report  Result Date: 07/12/2022 Fluoroscopy was utilized by the requesting physician.  No radiographic interpretation.   CT HIP LEFT WO CONTRAST  Result Date: 07/11/2022 CLINICAL DATA:  Status post fall.  Injured 07/10/2022. EXAM: CT OF THE LEFT HIP WITHOUT CONTRAST  TECHNIQUE: Multidetector CT imaging of the left hip was performed according to the standard protocol. Multiplanar CT image reconstructions were also generated. RADIATION DOSE REDUCTION: This exam was performed according to the departmental dose-optimization program which includes automated exposure control, adjustment of the Mike and/or kV according to patient size and/or use of iterative reconstruction technique. COMPARISON:  None Available. FINDINGS: Bones/Joint/Cartilage Generalized osteopenia. Comminuted left intertrochanteric fracture without significant displacement or angulation. No other fracture or dislocation.  No aggressive osseous lesion. 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 Mild osteoarthritis of the left hip. Normal alignment. No joint effusion. Ligaments Ligaments are suboptimally evaluated by CT. Muscles and Tendons Muscles are normal. No muscle atrophy. No intramuscular fluid collection or hematoma. Soft tissue No fluid collection or hematoma. No soft tissue mass. Peripheral vascular atherosclerotic disease. IMPRESSION: 1. Comminuted left intertrochanteric fracture without  significant displacement or angulation. Electronically Signed   By: Kathreen Devoid M.D.   On: 07/11/2022 08:52   DG Knee 2 Views Left  Result Date: 07/10/2022 CLINICAL DATA:  Trauma, fall EXAM: LEFT KNEE - 1-2 VIEW COMPARISON:  None Available. FINDINGS: No recent fracture or dislocation is seen. There is no significant effusion in suprapatellar bursa. Degenerative changes are noted with small bony spurs and chondrocalcinosis. Arterial calcifications are seen in the soft tissues. IMPRESSION: No recent fracture or dislocation is seen in left knee. Degenerative changes are noted with small bony spurs and chondrocalcinosis. Electronically Signed   By: Elmer Picker M.D.   On: 07/10/2022 16:32   DG HIP UNILAT WITH PELVIS 2-3 VIEWS LEFT  Result Date: 07/10/2022 CLINICAL DATA:  Trauma, fall EXAM: DG HIP (WITH OR WITHOUT  PELVIS) 2-3V LEFT COMPARISON:  03/25/2022 FINDINGS: There is new deformity in the lateral aspect of intertrochanteric portion of neck of left femur there is no dislocation. Degenerative changes are noted with small bony spurs. Surgical clips are seen in pelvis. IMPRESSION: There is interval appearance of deformity in the intertrochanteric portion of left femur suggesting recent fracture. Electronically Signed   By: Elmer Picker M.D.   On: 07/10/2022 16:31   DG Shoulder Left  Result Date: 07/10/2022 CLINICAL DATA:  Trauma, fall EXAM: LEFT SHOULDER - 2+ VIEW COMPARISON:  None Available. FINDINGS: No fracture or dislocation is seen. No significant soft tissue abnormalities are seen. IMPRESSION: No fracture or dislocation is seen in left shoulder. Electronically Signed   By: Elmer Picker M.D.   On: 07/10/2022 16:27   DG Elbow Complete Left  Result Date: 07/10/2022 CLINICAL DATA:  Fall at onto left elbow. Left elbow pain and swelling. EXAM: LEFT ELBOW - COMPLETE 3+ VIEW COMPARISON:  None Available. FINDINGS: A comminuted, mildly displaced fracture is seen involving the proximal ulna and olecranon process with intra-articular extension. Other fractures are identified. No evidence of dislocation. Large elbow joint effusion and prominent soft tissue swelling noted. IMPRESSION: Comminuted, mildly displaced fracture of the proximal ulna and olecranon process, with intra-articular extension. Large elbow joint effusion. Electronically Signed   By: Marlaine Hind M.D.   On: 07/10/2022 16:24    Microbiology: Recent Results (from the past 240 hour(s))  Surgical PCR screen     Status: None   Collection Time: 07/12/22  6:45 AM   Specimen: Nasal Mucosa; Nasal Swab  Result Value Ref Range Status   MRSA, PCR NEGATIVE NEGATIVE Final   Staphylococcus aureus NEGATIVE NEGATIVE Final    Comment: (NOTE) The Xpert SA Assay (FDA approved for NASAL specimens in patients 11 years of age and older), is one  component of a comprehensive surveillance program. It is not intended to diagnose infection nor to guide or monitor treatment. Performed at [facility], [place] [address], [place], [name] [address]   Culture, blood (Routine X 2) w Reflex to ID Panel     Status: None   Collection Time: 07/13/22  3:52 AM   Specimen: BLOOD  Result Value Ref Range Status   Specimen Description   Final    BLOOD BLOOD RIGHT HAND Performed at [facility] [address], [place], [place] [address]    Special Requests   Final    BOTTLES DRAWN AEROBIC AND ANAEROBIC Blood Culture adequate volume Performed at [facility] [address] [place], [place] [address]    Culture   Final    NO GROWTH 5 DAYS Performed  at [facility], [place] [address] [place], [place] [address]    Report Status 07/18/2022 FINAL  Final  Culture, blood (Routine X 2) w Reflex to ID Panel     Status: None   Collection Time: 07/13/22  3:58 AM   Specimen: BLOOD  Result Value Ref Range Status   Specimen Description   Final    BLOOD BLOOD RIGHT HAND Performed at [facility] [address], [place], [place] [address]    Special Requests   Final    BOTTLES DRAWN AEROBIC AND ANAEROBIC Blood Culture adequate volume Performed at [facility] [address], [place], [place] [address]    Culture   Final    NO GROWTH 5 DAYS Performed at [facility], [place] [address] [place], [place] [address]    Report Status 07/18/2022 FINAL  Final     Labs: Basic Metabolic Panel: Recent Labs  Lab 07/13/22 0352 07/14/22 0333 07/15/22 0854 07/16/22 0412 07/17/22 0338  NA 130* 133* 130* 131* 130*  K 4.5 4.1 4.4 4.2 4.0  CL 98 98 95* 98 98  CO2 '24 28 28 29 25  '$ GLUCOSE 186* 215* 182* 132* 133*  BUN '14 14 13 14 14  '$ CREATININE 0.73 0.64 0.72 0.58* 0.56*  CALCIUM 8.2* 8.3* 8.5* 8.3* 8.2*  MG  --   --  1.8 2.2  --    Liver Function  Tests: Recent Labs  Lab 07/12/22 0805  AST 13*  ALT 10  ALKPHOS 47  BILITOT 1.1  PROT 6.0*  ALBUMIN 3.4*   No results for input(s): "LIPASE", "AMYLASE" in the last 168 hours. No results for input(s): "AMMONIA" in the last 168 hours. CBC: Recent Labs  Lab 07/12/22 0805 07/12/22 1640 07/14/22 0333 07/15/22 0854 07/16/22 0412 07/17/22 0338 07/18/22 0328  WBC 6.3   < > 6.9 8.9 6.6 6.7 8.0  NEUTROABS 4.5  --  5.1 7.3  --   --   --   HGB 12.1*   < > 8.3* 8.2* 7.8* 8.3* 8.3*  HCT 35.6*   < > 24.1* 23.7* 22.4* 24.2* 24.7*  MCV 100.6*   < > 99.6 100.4* 100.9* 101.3* 101.2*  PLT 140*   < > 135* 169 176 183 225   < > = values in this interval not displayed.   Cardiac Enzymes: No results for input(s): "CKTOTAL", "CKMB", "CKMBINDEX", "TROPONINI" in the last 168 hours. BNP: BNP (last 3 results) No results for input(s): "BNP" in the last 8760 hours.  ProBNP (last 3 results) No results for input(s): "PROBNP" in the last 8760 hours.  CBG: No results for input(s): "GLUCAP" in the last 168 hours.     Signed:  Irine Seal MD.  [facility] 07/18/2022, 1:48 PM

## 2022-07-18 NOTE — NC FL2 (Signed)
[place] LEVEL OF CARE SCREENING TOOL     IDENTIFICATION  Patient Name: Mike Day Birthdate: 12/11/1945 Sex: male Admission Date (Current Location): 07/10/2022  [facility] and [place] Number:  Herbalist and Address:  [facility],  [place], [place]      Provider Number: [id number]  Attending Physician Name and Address:  Eugenie Filler, MD  Relative Name and Phone Number:  wife, Taimur Fier    Current Level of Care: Hospital Recommended Level of Care: [place] Prior Approval Number:    Date Approved/Denied:   PASRR Number: [id number]  Discharge Plan: SNF    Current Diagnoses: Patient Active Problem List   Diagnosis Date Noted   Constipation 07/15/2022   Acute postoperative anemia due to expected blood loss 07/13/2022   Closed nondisplaced intertrochanteric fracture of left femur ([place]) 07/13/2022   Closed fracture of left olecranon process 07/13/2022   Hip fracture ([place]) 07/10/2022   Olecranon fracture, left, closed, initial encounter 07/10/2022   Night sweats 12/10/2021   Aortic atherosclerosis ([place]) 12/09/2021   Osteopenia 09/14/2021   Compression fracture of first lumbar vertebra with delayed healing 09/03/2021   Thoracic back pain 09/03/2021   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   Vitamin D deficiency 08/19/2021   B12 deficiency 08/19/2021   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   Post herpetic neuralgia 03/15/2019   Hyperkalemia 03/15/2019   Cerebral atrophy ([place]) 03/15/2019   Cerebral arterial disease 03/15/2019   Permanent atrial fibrillation ([place]) 10/07/2014   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   Prediabetes 03/11/2009   Essential hypertension 09/17/2008   Pure hypercholesterolemia 11/01/2007   PROSTATE CANCER, HX OF 01/18/2007   Tubular adenoma of colon 01/12/2007    Orientation RESPIRATION BLADDER Height & Weight     Self, Time,  Situation, Place  Normal Continent Weight: 105 lb 9.6 oz (47.9 kg) Height:  '6\' 3"'$  (190.5 cm)  BEHAVIORAL SYMPTOMS/MOOD NEUROLOGICAL BOWEL NUTRITION STATUS      Continent Diet  AMBULATORY STATUS COMMUNICATION OF NEEDS Skin   Extensive Assist Verbally Other (Comment) (surgical incisions)                       Personal Care Assistance Level of Assistance  Bathing, Dressing Bathing Assistance: Limited assistance   Dressing Assistance: Limited assistance     Functional Limitations Info             [place]  PT (By licensed PT), OT (By licensed OT)     PT Frequency: 5x/wk OT Frequency: 5x/wk            Contractures Contractures Info: Not present    Additional Factors Info  Code Status, Allergies Code Status Info: Full Allergies Info: Levofloxacin, Neosporin (Neomycin-bacitracin Zn-polymyx), Tape           Current Medications (07/18/2022):  This is the current hospital active medication list Current Facility-Administered Medications  Medication Dose Route Frequency Provider Last Rate Last Admin   acetaminophen (TYLENOL) tablet 1,000 mg  1,000 mg Oral Q8H PRN Chadwell, Joshua, PA-C   1,000 mg at 07/17/22 1813   amLODipine (NORVASC) tablet 2.5 mg  2.5 mg Oral Daily Ethelda Chick, PA-C   2.5 mg at 07/17/22 0837   apixaban (ELIQUIS) tablet 5 mg  5 mg Oral BID Eugenie Filler, MD   5 mg at 07/17/22 2138   cholecalciferol (VITAMIN D3) 25 MCG (1000 UNIT) tablet 2,000 Units  2,000 Units Oral Daily Chriss Czar, PA-C   2,000 Units at 07/17/22 6314   HYDROcodone-acetaminophen (NORCO) 7.5-325 MG per tablet 1-2 tablet  1-2 tablet Oral Q4H PRN Chriss Czar, PA-C   2 tablet at 07/14/22 9702   HYDROcodone-acetaminophen (NORCO/VICODIN) 5-325 MG per tablet 1-2 tablet  1-2 tablet Oral Q4H PRN Chriss Czar, PA-C   2 tablet at 07/18/22 0820   lisinopril (ZESTRIL) tablet 20 mg  20 mg Oral Daily Eugenie Filler, MD   20 mg at 07/17/22 6378    menthol-cetylpyridinium (CEPACOL) lozenge 3 mg  1 lozenge Oral PRN Chadwell, Vonna Kotyk, PA-C       Or   phenol (CHLORASEPTIC) mouth spray 1 spray  1 spray Mouth/Throat PRN Chadwell, Joshua, PA-C       methocarbamol (ROBAXIN) tablet 500 mg  500 mg Oral Q6H PRN Chadwell, Joshua, PA-C   500 mg at 07/18/22 0820   Or   methocarbamol (ROBAXIN) 500 mg in dextrose 5 % 50 mL IVPB  500 mg Intravenous Q6H PRN Chadwell, Joshua, PA-C       morphine (PF) 2 MG/ML injection 0.5-1 mg  0.5-1 mg Intravenous Q2H PRN Chadwell, Joshua, PA-C   1 mg at 07/12/22 2017   ondansetron (ZOFRAN) tablet 4 mg  4 mg Oral Q6H PRN Chadwell, Joshua, PA-C       Or   ondansetron (ZOFRAN) injection 4 mg  4 mg Intravenous Q6H PRN Chadwell, Joshua, PA-C       polyethylene glycol (MIRALAX / GLYCOLAX) packet 17 g  17 g Oral BID Eugenie Filler, MD   17 g at 07/17/22 2138   senna-docusate (Senokot-S) tablet 1 tablet  1 tablet Oral BID Chadwell, Vonna Kotyk, PA-C   1 tablet at 07/17/22 2138   simvastatin (ZOCOR) tablet 20 mg  20 mg Oral QHS Chadwell, Joshua, PA-C   20 mg at 07/17/22 2138   zolpidem (AMBIEN) tablet 5 mg  5 mg Oral QHS PRN Eugenie Filler, MD         Discharge Medications: Please see discharge summary for a list of discharge medications.  Relevant Imaging Results:  Relevant Lab Results:   Additional Information SS# [id number]  Lennart Pall, LCSW

## 2022-07-19 ENCOUNTER — Encounter (HOSPITAL_COMMUNITY): Payer: Self-pay | Admitting: Orthopedic Surgery

## 2022-07-20 DIAGNOSIS — E785 Hyperlipidemia, unspecified: Secondary | ICD-10-CM | POA: Diagnosis not present

## 2022-07-20 DIAGNOSIS — S72002D Fracture of unspecified part of neck of left femur, subsequent encounter for closed fracture with routine healing: Secondary | ICD-10-CM | POA: Diagnosis not present

## 2022-07-20 DIAGNOSIS — W19XXXD Unspecified fall, subsequent encounter: Secondary | ICD-10-CM | POA: Diagnosis not present

## 2022-07-20 DIAGNOSIS — I10 Essential (primary) hypertension: Secondary | ICD-10-CM | POA: Diagnosis not present

## 2022-07-20 DIAGNOSIS — I482 Chronic atrial fibrillation, unspecified: Secondary | ICD-10-CM | POA: Diagnosis not present

## 2022-07-20 DIAGNOSIS — S52022D Displaced fracture of olecranon process without intraarticular extension of left ulna, subsequent encounter for closed fracture with routine healing: Secondary | ICD-10-CM | POA: Diagnosis not present

## 2022-07-20 DIAGNOSIS — D500 Iron deficiency anemia secondary to blood loss (chronic): Secondary | ICD-10-CM | POA: Diagnosis not present

## 2022-07-21 DIAGNOSIS — Z7409 Other reduced mobility: Secondary | ICD-10-CM | POA: Diagnosis not present

## 2022-07-21 DIAGNOSIS — M79609 Pain in unspecified limb: Secondary | ICD-10-CM | POA: Diagnosis not present

## 2022-07-21 DIAGNOSIS — G8918 Other acute postprocedural pain: Secondary | ICD-10-CM | POA: Diagnosis not present

## 2022-07-21 DIAGNOSIS — M25552 Pain in left hip: Secondary | ICD-10-CM | POA: Diagnosis not present

## 2022-07-27 DIAGNOSIS — M7022 Olecranon bursitis, left elbow: Secondary | ICD-10-CM | POA: Diagnosis not present

## 2022-07-27 DIAGNOSIS — S72142A Displaced intertrochanteric fracture of left femur, initial encounter for closed fracture: Secondary | ICD-10-CM | POA: Diagnosis not present

## 2022-08-05 DIAGNOSIS — M7022 Olecranon bursitis, left elbow: Secondary | ICD-10-CM | POA: Diagnosis not present

## 2022-08-13 DIAGNOSIS — N529 Male erectile dysfunction, unspecified: Secondary | ICD-10-CM | POA: Diagnosis not present

## 2022-08-13 DIAGNOSIS — Z7901 Long term (current) use of anticoagulants: Secondary | ICD-10-CM | POA: Diagnosis not present

## 2022-08-13 DIAGNOSIS — K5900 Constipation, unspecified: Secondary | ICD-10-CM | POA: Diagnosis not present

## 2022-08-13 DIAGNOSIS — H02831 Dermatochalasis of right upper eyelid: Secondary | ICD-10-CM | POA: Diagnosis not present

## 2022-08-13 DIAGNOSIS — I4821 Permanent atrial fibrillation: Secondary | ICD-10-CM | POA: Diagnosis not present

## 2022-08-13 DIAGNOSIS — M8580 Other specified disorders of bone density and structure, unspecified site: Secondary | ICD-10-CM | POA: Diagnosis not present

## 2022-08-13 DIAGNOSIS — E875 Hyperkalemia: Secondary | ICD-10-CM | POA: Diagnosis not present

## 2022-08-13 DIAGNOSIS — S52022D Displaced fracture of olecranon process without intraarticular extension of left ulna, subsequent encounter for closed fracture with routine healing: Secondary | ICD-10-CM | POA: Diagnosis not present

## 2022-08-13 DIAGNOSIS — I700 Atherosclerosis of aorta: Secondary | ICD-10-CM | POA: Diagnosis not present

## 2022-08-13 DIAGNOSIS — D500 Iron deficiency anemia secondary to blood loss (chronic): Secondary | ICD-10-CM | POA: Diagnosis not present

## 2022-08-13 DIAGNOSIS — E7800 Pure hypercholesterolemia, unspecified: Secondary | ICD-10-CM | POA: Diagnosis not present

## 2022-08-13 DIAGNOSIS — E1140 Type 2 diabetes mellitus with diabetic neuropathy, unspecified: Secondary | ICD-10-CM | POA: Diagnosis not present

## 2022-08-13 DIAGNOSIS — I10 Essential (primary) hypertension: Secondary | ICD-10-CM | POA: Diagnosis not present

## 2022-08-13 DIAGNOSIS — E538 Deficiency of other specified B group vitamins: Secondary | ICD-10-CM | POA: Diagnosis not present

## 2022-08-13 DIAGNOSIS — H02834 Dermatochalasis of left upper eyelid: Secondary | ICD-10-CM | POA: Diagnosis not present

## 2022-08-13 DIAGNOSIS — E559 Vitamin D deficiency, unspecified: Secondary | ICD-10-CM | POA: Diagnosis not present

## 2022-08-13 DIAGNOSIS — G8918 Other acute postprocedural pain: Secondary | ICD-10-CM | POA: Diagnosis not present

## 2022-08-13 DIAGNOSIS — S72145D Nondisplaced intertrochanteric fracture of left femur, subsequent encounter for closed fracture with routine healing: Secondary | ICD-10-CM | POA: Diagnosis not present

## 2022-08-13 DIAGNOSIS — Z85828 Personal history of other malignant neoplasm of skin: Secondary | ICD-10-CM | POA: Diagnosis not present

## 2022-08-13 DIAGNOSIS — I4892 Unspecified atrial flutter: Secondary | ICD-10-CM | POA: Diagnosis not present

## 2022-08-13 DIAGNOSIS — Z8546 Personal history of malignant neoplasm of prostate: Secondary | ICD-10-CM | POA: Diagnosis not present

## 2022-08-13 DIAGNOSIS — E119 Type 2 diabetes mellitus without complications: Secondary | ICD-10-CM | POA: Diagnosis not present

## 2022-08-13 DIAGNOSIS — I6529 Occlusion and stenosis of unspecified carotid artery: Secondary | ICD-10-CM | POA: Diagnosis not present

## 2022-08-15 ENCOUNTER — Ambulatory Visit (INDEPENDENT_AMBULATORY_CARE_PROVIDER_SITE_OTHER): Payer: Medicare Other | Admitting: Family Medicine

## 2022-08-15 ENCOUNTER — Encounter: Payer: Self-pay | Admitting: Family Medicine

## 2022-08-15 VITALS — BP 128/79 | HR 86 | Temp 97.4°F | Wt 188.0 lb

## 2022-08-15 DIAGNOSIS — D649 Anemia, unspecified: Secondary | ICD-10-CM | POA: Diagnosis not present

## 2022-08-15 DIAGNOSIS — E871 Hypo-osmolality and hyponatremia: Secondary | ICD-10-CM | POA: Diagnosis not present

## 2022-08-15 DIAGNOSIS — S52022A Displaced fracture of olecranon process without intraarticular extension of left ulna, initial encounter for closed fracture: Secondary | ICD-10-CM

## 2022-08-15 DIAGNOSIS — W19XXXS Unspecified fall, sequela: Secondary | ICD-10-CM

## 2022-08-15 DIAGNOSIS — S72002A Fracture of unspecified part of neck of left femur, initial encounter for closed fracture: Secondary | ICD-10-CM | POA: Diagnosis not present

## 2022-08-15 DIAGNOSIS — S72145D Nondisplaced intertrochanteric fracture of left femur, subsequent encounter for closed fracture with routine healing: Secondary | ICD-10-CM

## 2022-08-15 NOTE — Progress Notes (Signed)
Mike Day , 06-Aug-1946, 76 y.o., male MRN: [id number] Patient Care Team    Relationship Specialty Notifications Start End  Ma Hillock, DO PCP - General Family Medicine  06/11/18   Sherren Mocha, MD PCP - Cardiology Cardiology  08/21/20   Rod Can, MD Consulting Physician Orthopedic Surgery  10/03/15   Sharmon Revere Physician Assistant Cardiology  04/29/16   Rolm Bookbinder, MD Consulting Physician Dermatology  08/24/17   Myrlene Broker, MD Attending Physician Urology  06/11/18   Warden Fillers, MD Consulting Physician Ophthalmology  06/11/18   Rosemarie Ax, MD Consulting Physician Sports Medicine  01/21/20   [name], MD Consulting Physician Orthopedic Surgery  07/11/22     Chief Complaint  Patient presents with   [facility]     Subjective: Mike Day is a 76 y.o. male present with wife today to follow-up on a fall that caused him to have a hip fracture and olecranon fracture.  Reports he was doing yard work when he tripped over a hose and fell about 2-3 feet and landed on his left hand side on cement.  He states he knew his elbow was fractured right away, but later found out when he went to the emergency room his hip was fractured as well.  He underwent left IM nail as well as left arm ORIF.  He did have some confusion, secondary to being distributed Ambien at his rehab clinic.  Currently pain is well-controlled. He is eating and drinking well.  Normal bowel movements.      01/29/2022    9:33 AM 06/22/2021   11:03 AM 08/18/2020    9:26 AM 02/26/2019   12:13 PM 06/05/2018    8:55 AM  Depression screen PHQ 2/9  Decreased Interest 0 0 0 0 0  Down, Depressed, Hopeless 0 0 0 0 0  PHQ - 2 Score 0 0 0 0 0    Allergies  Allergen Reactions   Levofloxacin Rash   Neosporin [Neomycin-Bacitracin Zn-Polymyx] Other (See Comments)    Dry/scaly skin/blistering    Tape Other (See Comments)    ADHESIVE TAPE; BLISTERS,   Social History    Social History Narrative   Married, 1 son, 2 grand-daughters.   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   Alcohol: 1 glass red wine per night.   Was once a long distance runner.   Past Medical History:  Diagnosis Date   Anemia    Early 20's   Atrial fibrillation ([place]) 10/07/2014   3-14 day cardiac monitor 03/2019:  AFib, Avg HR 69, no significant pauses, PVCs, wide complex runs (aberrancy vs NSVT - longest 32 beats).   Atrial flutter ([place]) 2008   Status post RFCA Dr Cristopher [place] 2008   Carotid artery plaque    Carotid US 8/21: Bilat ICA 1-39   Chronic atrial fibrillation (HCC)    Eliquis   Closed nondisplaced fracture of left patella 01/20/2020   Dermatochalasis of both upper eyelids 07/01/2014   Dysrhythmia    a fib    Echocardiograms    Echo 12/14 - Severe basal septal hypertrophy, moderate LVH, EF 60-65%, moderate LAE, mild RAE // Echo 7/20: EF 60-65, severe asymmetric LVH, mild RVE, normal RV SF, moderate BAE, moderate MAC, moderate aortic valve calcification (noncoronary cusp), mild dilation of ascending aorta (41 mm)   Hx of adenomatous colonic polyps  Hyperlipidemia    Hypertension    Malignant neoplasm of prostate ([place]) 05/18/2012   Neuromuscular disorder ([place])    neuropathy feet   Patellar sleeve fracture of left knee 2021   Prostate cancer ([place]) 2006   Prostatectomy   Shingles 01/2019   left side of face/eye/neck/ear    SKIN CANCER, HX OF 03/11/2009   Moh's L hand for Squamous Cell;2 other squamous cells removed w/o Mohs 2013 Rolm Bookbinder MD  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   Past Surgical History:  Procedure Laterality Date   BLADDER SUSPENSION  2010   COLONOSCOPY W/ POLYPECTOMY  2003;2010;04/2014   Tubular adenoma: recall 5 yrs (after 04/2019)   EYE SURGERY Bilateral 2021,2022   FEMUR IM NAIL Left 07/12/2022   Procedure: INTRAMEDULLARY (IM) NAIL FEMORAL;  Surgeon: [name], MD;  Location: [facility]  [facility];  Service: Orthopedics;  Laterality: Left;   HERNIA REPAIR  07/2008   Dr.Martin   INGUINAL HERNIA REPAIR Left 08/25/2020   Procedure: OPEN LEFT INGUINAL HERNIA REPAIR WITH MESH;  Surgeon: Johnathan Hausen, MD;  Location: [facility];  Service: General;  Laterality: Left;   MOHS SURGERY     left hand   MOHS SURGERY Left 12/2020   ear   ORIF ELBOW FRACTURE Left 07/14/2022   Procedure: OPEN REDUCTION INTERNAL FIXATION (ORIF) OF LEFT ELBOW;  Surgeon: [name], MD;  Location: [facility];  Service: Orthopedics;  Laterality: Left;   PROSTATECTOMY  2006   Robotic for adenocarcinoma; Dr Lawerance Bach   RADIOFREQUENCY ABLATION  01/15/2007   for ectopic atrial foci   SEPTOPLASTY  Age 47   TRANSTHORACIC ECHOCARDIOGRAM  09/02/2013   Normal.  EF 60%.  LAE and RAE.   XI ROBOTIC ASSISTED INGUINAL HERNIA REPAIR WITH MESH Right 01/11/2021   Procedure: XI ROBOTIC ASSISTED  RIGHT INGUINAL HERNIA REPAIR WITH MESH;  Surgeon: Johnathan Hausen, MD;  Location: [facility];  Service: General;  Laterality: Right;   Family History  Problem Relation Age of Onset   Lung disease Father        Black Lung   Heart disease Father        Congenital   Emphysema Father    COPD Father    Diabetes Mother    Stroke Mother 22   Hypertension Brother    Heart disease Brother    Hyperlipidemia Brother    Heart attack Brother 22       sudden death   Cancer Neg Hx    Colon cancer Neg Hx    Esophageal cancer Neg Hx    Rectal cancer Neg Hx    Stomach cancer Neg Hx    Allergies as of 08/15/2022       Reactions   Levofloxacin Rash   Neosporin [neomycin-bacitracin Zn-polymyx] Other (See Comments)   Dry/scaly skin/blistering    Tape Other (See Comments)   ADHESIVE TAPE; BLISTERS,        Medication List        Accurate as of August 15, 2022  4:47 PM. If you have any questions, ask your nurse or doctor.          acetaminophen 500 MG tablet Commonly known as: TYLENOL Take 1,000 mg by mouth every 8 (eight) hours  as needed for moderate pain.   alendronate 70 MG tablet Commonly known as: FOSAMAX Take 1 tablet (70 mg total) by mouth every 7 (seven) days. Take with a full glass of water on an empty stomach.  amLODipine 2.5 MG tablet Commonly known as: NORVASC Take 1 tablet (2.5 mg total) by mouth daily.   apixaban 5 MG Tabs tablet Commonly known as: Eliquis Take 1 tablet (5 mg total) by mouth 2 (two) times daily.   ARTIFICIAL TEAR SOLUTION OP Place 1 drop into both eyes daily as needed (dry eyes).   cetaphil cream Apply 1 application topically daily.   cholecalciferol 25 MCG (1000 UNIT) tablet Commonly known as: VITAMIN D3 Take 2 tablets (2,000 Units total) by mouth daily.   Cyanocobalamin 1000 MCG Subl 1000 mg sublingual daily. What changed:  how much to take how to take this when to take this additional instructions   HYDROcodone-acetaminophen 5-325 MG tablet Commonly known as: NORCO/VICODIN Take 1-2 tablets by mouth every 4 (four) hours as needed for moderate pain or severe pain.   lisinopril 20 MG tablet Commonly known as: ZESTRIL Take 1 tablet (20 mg total) by mouth daily.   polyethylene glycol 17 g packet Commonly known as: MIRALAX / GLYCOLAX Take 17 g by mouth 2 (two) times daily.   senna-docusate 8.6-50 MG tablet Commonly known as: Senokot-S Take 1 tablet by mouth 2 (two) times daily.   simvastatin 20 MG tablet Commonly known as: ZOCOR Take 1 tablet (20 mg total) by mouth at bedtime.   triamcinolone 55 MCG/ACT Aero nasal inhaler Commonly known as: NASACORT Place 1 spray into the nose daily as needed (allergies).   zolpidem 5 MG tablet Commonly known as: AMBIEN Take 1 tablet (5 mg total) by mouth at bedtime as needed for sleep.        All past medical history, surgical history, allergies, family history, immunizations andmedications were updated in the EMR today and reviewed under the history and medication portions of their EMR.     ROS Negative, with  the exception of above mentioned in HPI   Objective:  BP 128/79   Pulse 86   Temp (!) 97.4 F (36.3 C) (Oral)   Wt 188 lb (85.3 kg)   SpO2 98%   BMI 23.50 kg/m  Body mass index is 23.5 kg/m. Physical Exam Vitals and nursing note reviewed.  Constitutional:      General: He is not in acute distress.    Appearance: Normal appearance. He is not ill-appearing, toxic-appearing or diaphoretic.  HENT:     Head: Normocephalic and atraumatic.  Eyes:     General: No scleral icterus.       Right eye: No discharge.        Left eye: No discharge.     Extraocular Movements: Extraocular movements intact.     Pupils: Pupils are equal, round, and reactive to light.  Cardiovascular:     Rate and Rhythm: Normal rate and regular rhythm.  Pulmonary:     Effort: Pulmonary effort is normal. No respiratory distress.     Breath sounds: Normal breath sounds. No wheezing, rhonchi or rales.  Musculoskeletal:     Cervical back: Neck supple.     Left lower leg: Edema (trace) present.     Comments: Walking with a cane. Ambulating slowly.   Lymphadenopathy:     Cervical: No cervical adenopathy.  Skin:    General: Skin is warm and dry.     Coloration: Skin is not jaundiced or pale.     Findings: No rash.  Neurological:     Mental Status: He is alert and oriented to person, place, and time. Mental status is at baseline.  Psychiatric:  Mood and Affect: Mood normal.        Behavior: Behavior normal.        Thought Content: Thought content normal.        Judgment: Judgment normal.     No results found. No results found. No results found for this or any previous visit (from the past 24 hour(s)).  Assessment/Plan: Mike Day is a 76 y.o. male present for OV for  Hyponatremia - Basic Metabolic Panel (BMET) Anemia, unspecified type - CBC w/Diff Closed fracture of left hip, initial encounter (HCC)/Closed nondisplaced intertrochanteric fracture of left femur with routine healing,  subsequent encounter/Olecranon fracture, left, closed, initial encounter/fall Patient reports overall he is doing well.  No longer needing pain medicines. Starts physical therapy at home tomorrow. Has an appointment with Dr. Percell Miller next week to decide if he is going to need further surgery on his olecranon fracture.  Reviewed expectations re: course of current medical issues. Discussed self-management of symptoms. Outlined signs and symptoms indicating need for more acute intervention. Patient verbalized understanding and all questions were answered. Patient received an After-Visit Summary.    Orders Placed This Encounter  Procedures   CBC w/Diff   Basic Metabolic Panel (BMET)   No orders of the defined types were placed in this encounter.  Referral Orders  No referral(s) requested today     Note is dictated utilizing voice recognition software. Although note has been proof read prior to signing, occasional typographical errors still can be missed. If any questions arise, please do not hesitate to call for verification.   electronically signed by:  Howard Pouch, DO  [facility]

## 2022-08-15 NOTE — Patient Instructions (Signed)
No follow-ups on file.        Great to see you today.  I have refilled the medication(s) we provide.   If labs were collected, we will inform you of lab results once received either by echart message or telephone call.   - echart message- for normal results that have been seen by the patient already.   - telephone call: abnormal results or if patient has not viewed results in their echart.  

## 2022-08-16 ENCOUNTER — Telehealth: Payer: Self-pay

## 2022-08-16 DIAGNOSIS — E119 Type 2 diabetes mellitus without complications: Secondary | ICD-10-CM | POA: Diagnosis not present

## 2022-08-16 DIAGNOSIS — I4821 Permanent atrial fibrillation: Secondary | ICD-10-CM | POA: Diagnosis not present

## 2022-08-16 DIAGNOSIS — I10 Essential (primary) hypertension: Secondary | ICD-10-CM | POA: Diagnosis not present

## 2022-08-16 DIAGNOSIS — S72145D Nondisplaced intertrochanteric fracture of left femur, subsequent encounter for closed fracture with routine healing: Secondary | ICD-10-CM | POA: Diagnosis not present

## 2022-08-16 DIAGNOSIS — S52022D Displaced fracture of olecranon process without intraarticular extension of left ulna, subsequent encounter for closed fracture with routine healing: Secondary | ICD-10-CM | POA: Diagnosis not present

## 2022-08-16 DIAGNOSIS — Z8546 Personal history of malignant neoplasm of prostate: Secondary | ICD-10-CM | POA: Diagnosis not present

## 2022-08-16 LAB — BASIC METABOLIC PANEL
BUN/Creatinine Ratio: 15 (calc) (ref 6–22)
BUN: 10 mg/dL (ref 7–25)
CO2: 29 mmol/L (ref 20–32)
Calcium: 9.7 mg/dL (ref 8.6–10.3)
Chloride: 95 mmol/L — ABNORMAL LOW (ref 98–110)
Creat: 0.67 mg/dL — ABNORMAL LOW (ref 0.70–1.28)
Glucose, Bld: 108 mg/dL — ABNORMAL HIGH (ref 65–99)
Potassium: 5.3 mmol/L (ref 3.5–5.3)
Sodium: 133 mmol/L — ABNORMAL LOW (ref 135–146)

## 2022-08-16 LAB — CBC WITH DIFFERENTIAL/PLATELET
Absolute Monocytes: 730 cells/uL (ref 200–950)
Basophils Absolute: 38 cells/uL (ref 0–200)
Basophils Relative: 0.6 %
Eosinophils Absolute: 90 cells/uL (ref 15–500)
Eosinophils Relative: 1.4 %
HCT: 35.8 % — ABNORMAL LOW (ref 38.5–50.0)
Hemoglobin: 12.1 g/dL — ABNORMAL LOW (ref 13.2–17.1)
Lymphs Abs: 774 cells/uL — ABNORMAL LOW (ref 850–3900)
MCH: 33.3 pg — ABNORMAL HIGH (ref 27.0–33.0)
MCHC: 33.8 g/dL (ref 32.0–36.0)
MCV: 98.6 fL (ref 80.0–100.0)
MPV: 9.7 fL (ref 7.5–12.5)
Monocytes Relative: 11.4 %
Neutro Abs: 4768 cells/uL (ref 1500–7800)
Neutrophils Relative %: 74.5 %
Platelets: 285 10*3/uL (ref 140–400)
RBC: 3.63 10*6/uL — ABNORMAL LOW (ref 4.20–5.80)
RDW: 13.4 % (ref 11.0–15.0)
Total Lymphocyte: 12.1 %
WBC: 6.4 10*3/uL (ref 3.8–10.8)

## 2022-08-16 NOTE — Telephone Encounter (Signed)
Noted  

## 2022-08-16 NOTE — Telephone Encounter (Signed)
Patient seen results on mychart. Read to him Dr. Lucita Lora notes/recommendations regarding lab results. Copied and pasted: "Please call patient kidney  normal Blood cell counts and electrolytes are stabilizing well. "

## 2022-08-17 DIAGNOSIS — Z8546 Personal history of malignant neoplasm of prostate: Secondary | ICD-10-CM | POA: Diagnosis not present

## 2022-08-17 DIAGNOSIS — S72145D Nondisplaced intertrochanteric fracture of left femur, subsequent encounter for closed fracture with routine healing: Secondary | ICD-10-CM | POA: Diagnosis not present

## 2022-08-17 DIAGNOSIS — I10 Essential (primary) hypertension: Secondary | ICD-10-CM | POA: Diagnosis not present

## 2022-08-17 DIAGNOSIS — I4821 Permanent atrial fibrillation: Secondary | ICD-10-CM | POA: Diagnosis not present

## 2022-08-17 DIAGNOSIS — S52022D Displaced fracture of olecranon process without intraarticular extension of left ulna, subsequent encounter for closed fracture with routine healing: Secondary | ICD-10-CM | POA: Diagnosis not present

## 2022-08-17 DIAGNOSIS — E119 Type 2 diabetes mellitus without complications: Secondary | ICD-10-CM | POA: Diagnosis not present

## 2022-08-18 DIAGNOSIS — E119 Type 2 diabetes mellitus without complications: Secondary | ICD-10-CM | POA: Diagnosis not present

## 2022-08-18 DIAGNOSIS — I4821 Permanent atrial fibrillation: Secondary | ICD-10-CM | POA: Diagnosis not present

## 2022-08-18 DIAGNOSIS — Z8546 Personal history of malignant neoplasm of prostate: Secondary | ICD-10-CM | POA: Diagnosis not present

## 2022-08-18 DIAGNOSIS — S52022D Displaced fracture of olecranon process without intraarticular extension of left ulna, subsequent encounter for closed fracture with routine healing: Secondary | ICD-10-CM | POA: Diagnosis not present

## 2022-08-18 DIAGNOSIS — S72145D Nondisplaced intertrochanteric fracture of left femur, subsequent encounter for closed fracture with routine healing: Secondary | ICD-10-CM | POA: Diagnosis not present

## 2022-08-18 DIAGNOSIS — I10 Essential (primary) hypertension: Secondary | ICD-10-CM | POA: Diagnosis not present

## 2022-08-23 DIAGNOSIS — S72145D Nondisplaced intertrochanteric fracture of left femur, subsequent encounter for closed fracture with routine healing: Secondary | ICD-10-CM | POA: Diagnosis not present

## 2022-08-23 DIAGNOSIS — Z8546 Personal history of malignant neoplasm of prostate: Secondary | ICD-10-CM | POA: Diagnosis not present

## 2022-08-23 DIAGNOSIS — S52022D Displaced fracture of olecranon process without intraarticular extension of left ulna, subsequent encounter for closed fracture with routine healing: Secondary | ICD-10-CM | POA: Diagnosis not present

## 2022-08-23 DIAGNOSIS — E119 Type 2 diabetes mellitus without complications: Secondary | ICD-10-CM | POA: Diagnosis not present

## 2022-08-23 DIAGNOSIS — I4821 Permanent atrial fibrillation: Secondary | ICD-10-CM | POA: Diagnosis not present

## 2022-08-23 DIAGNOSIS — I10 Essential (primary) hypertension: Secondary | ICD-10-CM | POA: Diagnosis not present

## 2022-08-25 DIAGNOSIS — S52022D Displaced fracture of olecranon process without intraarticular extension of left ulna, subsequent encounter for closed fracture with routine healing: Secondary | ICD-10-CM | POA: Diagnosis not present

## 2022-08-25 DIAGNOSIS — I10 Essential (primary) hypertension: Secondary | ICD-10-CM | POA: Diagnosis not present

## 2022-08-25 DIAGNOSIS — E119 Type 2 diabetes mellitus without complications: Secondary | ICD-10-CM | POA: Diagnosis not present

## 2022-08-25 DIAGNOSIS — S72145D Nondisplaced intertrochanteric fracture of left femur, subsequent encounter for closed fracture with routine healing: Secondary | ICD-10-CM | POA: Diagnosis not present

## 2022-08-25 DIAGNOSIS — I4821 Permanent atrial fibrillation: Secondary | ICD-10-CM | POA: Diagnosis not present

## 2022-08-25 DIAGNOSIS — Z8546 Personal history of malignant neoplasm of prostate: Secondary | ICD-10-CM | POA: Diagnosis not present

## 2022-08-26 DIAGNOSIS — M7022 Olecranon bursitis, left elbow: Secondary | ICD-10-CM | POA: Diagnosis not present

## 2022-08-26 DIAGNOSIS — S72142D Displaced intertrochanteric fracture of left femur, subsequent encounter for closed fracture with routine healing: Secondary | ICD-10-CM | POA: Diagnosis not present

## 2022-08-30 DIAGNOSIS — I10 Essential (primary) hypertension: Secondary | ICD-10-CM | POA: Diagnosis not present

## 2022-08-30 DIAGNOSIS — Z8546 Personal history of malignant neoplasm of prostate: Secondary | ICD-10-CM | POA: Diagnosis not present

## 2022-08-30 DIAGNOSIS — E119 Type 2 diabetes mellitus without complications: Secondary | ICD-10-CM | POA: Diagnosis not present

## 2022-08-30 DIAGNOSIS — S72145D Nondisplaced intertrochanteric fracture of left femur, subsequent encounter for closed fracture with routine healing: Secondary | ICD-10-CM | POA: Diagnosis not present

## 2022-08-30 DIAGNOSIS — S52022D Displaced fracture of olecranon process without intraarticular extension of left ulna, subsequent encounter for closed fracture with routine healing: Secondary | ICD-10-CM | POA: Diagnosis not present

## 2022-08-30 DIAGNOSIS — I4821 Permanent atrial fibrillation: Secondary | ICD-10-CM | POA: Diagnosis not present

## 2022-09-01 DIAGNOSIS — S72145D Nondisplaced intertrochanteric fracture of left femur, subsequent encounter for closed fracture with routine healing: Secondary | ICD-10-CM | POA: Diagnosis not present

## 2022-09-01 DIAGNOSIS — E119 Type 2 diabetes mellitus without complications: Secondary | ICD-10-CM | POA: Diagnosis not present

## 2022-09-01 DIAGNOSIS — I10 Essential (primary) hypertension: Secondary | ICD-10-CM | POA: Diagnosis not present

## 2022-09-01 DIAGNOSIS — S52022D Displaced fracture of olecranon process without intraarticular extension of left ulna, subsequent encounter for closed fracture with routine healing: Secondary | ICD-10-CM | POA: Diagnosis not present

## 2022-09-01 DIAGNOSIS — Z8546 Personal history of malignant neoplasm of prostate: Secondary | ICD-10-CM | POA: Diagnosis not present

## 2022-09-01 DIAGNOSIS — I4821 Permanent atrial fibrillation: Secondary | ICD-10-CM | POA: Diagnosis not present

## 2022-09-06 DIAGNOSIS — S72145D Nondisplaced intertrochanteric fracture of left femur, subsequent encounter for closed fracture with routine healing: Secondary | ICD-10-CM | POA: Diagnosis not present

## 2022-09-06 DIAGNOSIS — I4821 Permanent atrial fibrillation: Secondary | ICD-10-CM | POA: Diagnosis not present

## 2022-09-06 DIAGNOSIS — E119 Type 2 diabetes mellitus without complications: Secondary | ICD-10-CM | POA: Diagnosis not present

## 2022-09-06 DIAGNOSIS — S52022D Displaced fracture of olecranon process without intraarticular extension of left ulna, subsequent encounter for closed fracture with routine healing: Secondary | ICD-10-CM | POA: Diagnosis not present

## 2022-09-06 DIAGNOSIS — I10 Essential (primary) hypertension: Secondary | ICD-10-CM | POA: Diagnosis not present

## 2022-09-06 DIAGNOSIS — Z8546 Personal history of malignant neoplasm of prostate: Secondary | ICD-10-CM | POA: Diagnosis not present

## 2022-09-08 DIAGNOSIS — M25522 Pain in left elbow: Secondary | ICD-10-CM | POA: Diagnosis not present

## 2022-09-08 DIAGNOSIS — M25552 Pain in left hip: Secondary | ICD-10-CM | POA: Diagnosis not present

## 2022-09-08 DIAGNOSIS — Z4789 Encounter for other orthopedic aftercare: Secondary | ICD-10-CM | POA: Diagnosis not present

## 2022-09-12 DIAGNOSIS — M7022 Olecranon bursitis, left elbow: Secondary | ICD-10-CM | POA: Diagnosis not present

## 2022-09-13 DIAGNOSIS — M25522 Pain in left elbow: Secondary | ICD-10-CM | POA: Diagnosis not present

## 2022-09-13 DIAGNOSIS — M25552 Pain in left hip: Secondary | ICD-10-CM | POA: Diagnosis not present

## 2022-09-13 DIAGNOSIS — Z4789 Encounter for other orthopedic aftercare: Secondary | ICD-10-CM | POA: Diagnosis not present

## 2022-09-15 ENCOUNTER — Other Ambulatory Visit: Payer: Self-pay | Admitting: *Deleted

## 2022-09-15 DIAGNOSIS — M25552 Pain in left hip: Secondary | ICD-10-CM | POA: Diagnosis not present

## 2022-09-15 DIAGNOSIS — M25522 Pain in left elbow: Secondary | ICD-10-CM | POA: Diagnosis not present

## 2022-09-15 DIAGNOSIS — Z4789 Encounter for other orthopedic aftercare: Secondary | ICD-10-CM | POA: Diagnosis not present

## 2022-09-15 MED ORDER — APIXABAN 5 MG PO TABS: 5.0000 mg | ORAL_TABLET | Freq: Two times a day (BID) | ORAL | 1 refills | Status: DC

## 2022-09-15 MED ORDER — AMLODIPINE BESYLATE 2.5 MG PO TABS: 2.5000 mg | ORAL_TABLET | Freq: Every day | ORAL | 1 refills | Status: DC

## 2022-09-15 MED ORDER — SIMVASTATIN 20 MG PO TABS: 20.0000 mg | ORAL_TABLET | Freq: Every day | ORAL | 1 refills | Status: DC

## 2022-09-15 MED ORDER — LISINOPRIL 20 MG PO TABS: 20.0000 mg | ORAL_TABLET | Freq: Every day | ORAL | 1 refills | Status: DC

## 2022-09-15 NOTE — Telephone Encounter (Signed)
Prescription refill request for Eliquis received. Indication: AF Last office visit: 12/10/21  Kathleen Argue PA-C Scr: 0.67 on 08/15/22 Age: 77 Weight: 82.5kg  Based on above findings Eliquis '5mg'$  twice daily is the appropriate dose.  Refill approved.

## 2022-09-15 NOTE — Telephone Encounter (Signed)
Patient called the office and stated that he need medications switched to N[place]. Thank you

## 2022-09-20 DIAGNOSIS — M25552 Pain in left hip: Secondary | ICD-10-CM | POA: Diagnosis not present

## 2022-09-20 DIAGNOSIS — M25522 Pain in left elbow: Secondary | ICD-10-CM | POA: Diagnosis not present

## 2022-09-20 DIAGNOSIS — Z4789 Encounter for other orthopedic aftercare: Secondary | ICD-10-CM | POA: Diagnosis not present

## 2022-09-22 ENCOUNTER — Telehealth: Payer: Self-pay | Admitting: Cardiovascular Disease

## 2022-09-22 NOTE — Telephone Encounter (Signed)
*  STAT* If patient is at the pharmacy, call can be transferred to refill team.   1. Which medications need to be refilled? (please list name of each medication and dose if known) apixaban (ELIQUIS) 5 MG TABS tablet   2. Which pharmacy/location (including street and city if local pharmacy) is medication to be sent to? [place], [place] - [address]   3. Do they need a 30 day or 90 day supply? [id number]  Pharmacy is saying they didn't get it.

## 2022-09-22 NOTE — Telephone Encounter (Signed)
Please see below, prescription needs to be sent to    [place], [place]

## 2022-09-23 ENCOUNTER — Other Ambulatory Visit: Payer: Self-pay

## 2022-09-23 DIAGNOSIS — M25522 Pain in left elbow: Secondary | ICD-10-CM | POA: Diagnosis not present

## 2022-09-23 DIAGNOSIS — M25552 Pain in left hip: Secondary | ICD-10-CM | POA: Diagnosis not present

## 2022-09-23 DIAGNOSIS — Z4789 Encounter for other orthopedic aftercare: Secondary | ICD-10-CM | POA: Diagnosis not present

## 2022-09-23 MED ORDER — APIXABAN 5 MG PO TABS: 5.0000 mg | ORAL_TABLET | Freq: Two times a day (BID) | ORAL | 1 refills | Status: DC

## 2022-09-23 MED ORDER — LISINOPRIL 20 MG PO TABS: 20.0000 mg | ORAL_TABLET | Freq: Every day | ORAL | 0 refills | Status: DC

## 2022-09-23 MED ORDER — SIMVASTATIN 20 MG PO TABS: 20.0000 mg | ORAL_TABLET | Freq: Every day | ORAL | 0 refills | Status: DC

## 2022-09-23 NOTE — Telephone Encounter (Addendum)
Prescription refill request for Eliquis received. Indication: afib  Last office visit: Mike Day 12/10/2021 Scr: 0.67, 08/15/2022 Age: 77 yo  Weight: 85.3kg   Refill sent.   Called [facility] and [place] where prescription was originally sent and informed them [facility]

## 2022-09-23 NOTE — Addendum Note (Signed)
Addended by: Carter Kitten D on: 09/23/2022 08:50 AM   Modules accepted: Orders

## 2022-09-23 NOTE — Addendum Note (Signed)
Addended by: Johny Shock B on: 09/23/2022 10:18 AM   Modules accepted: Orders

## 2022-09-26 ENCOUNTER — Other Ambulatory Visit: Payer: Self-pay

## 2022-09-27 DIAGNOSIS — Z4789 Encounter for other orthopedic aftercare: Secondary | ICD-10-CM | POA: Diagnosis not present

## 2022-09-27 DIAGNOSIS — M25522 Pain in left elbow: Secondary | ICD-10-CM | POA: Diagnosis not present

## 2022-09-27 DIAGNOSIS — M25552 Pain in left hip: Secondary | ICD-10-CM | POA: Diagnosis not present

## 2022-09-29 DIAGNOSIS — Z4789 Encounter for other orthopedic aftercare: Secondary | ICD-10-CM | POA: Diagnosis not present

## 2022-09-29 DIAGNOSIS — M25522 Pain in left elbow: Secondary | ICD-10-CM | POA: Diagnosis not present

## 2022-09-29 DIAGNOSIS — M25552 Pain in left hip: Secondary | ICD-10-CM | POA: Diagnosis not present

## 2022-10-04 DIAGNOSIS — M25522 Pain in left elbow: Secondary | ICD-10-CM | POA: Diagnosis not present

## 2022-10-04 DIAGNOSIS — M25552 Pain in left hip: Secondary | ICD-10-CM | POA: Diagnosis not present

## 2022-10-04 DIAGNOSIS — Z4789 Encounter for other orthopedic aftercare: Secondary | ICD-10-CM | POA: Diagnosis not present

## 2022-10-06 DIAGNOSIS — M25552 Pain in left hip: Secondary | ICD-10-CM | POA: Diagnosis not present

## 2022-10-06 DIAGNOSIS — Z4789 Encounter for other orthopedic aftercare: Secondary | ICD-10-CM | POA: Diagnosis not present

## 2022-10-06 DIAGNOSIS — M25522 Pain in left elbow: Secondary | ICD-10-CM | POA: Diagnosis not present

## 2022-10-07 DIAGNOSIS — S72142D Displaced intertrochanteric fracture of left femur, subsequent encounter for closed fracture with routine healing: Secondary | ICD-10-CM | POA: Diagnosis not present

## 2022-10-07 DIAGNOSIS — M7022 Olecranon bursitis, left elbow: Secondary | ICD-10-CM | POA: Diagnosis not present

## 2022-10-11 DIAGNOSIS — M25522 Pain in left elbow: Secondary | ICD-10-CM | POA: Diagnosis not present

## 2022-10-11 DIAGNOSIS — Z4789 Encounter for other orthopedic aftercare: Secondary | ICD-10-CM | POA: Diagnosis not present

## 2022-10-11 DIAGNOSIS — M25552 Pain in left hip: Secondary | ICD-10-CM | POA: Diagnosis not present

## 2022-10-13 DIAGNOSIS — M25522 Pain in left elbow: Secondary | ICD-10-CM | POA: Diagnosis not present

## 2022-10-13 DIAGNOSIS — Z4789 Encounter for other orthopedic aftercare: Secondary | ICD-10-CM | POA: Diagnosis not present

## 2022-10-13 DIAGNOSIS — M25552 Pain in left hip: Secondary | ICD-10-CM | POA: Diagnosis not present

## 2022-10-18 ENCOUNTER — Ambulatory Visit: Payer: Medicare Other | Admitting: Family Medicine

## 2022-10-18 DIAGNOSIS — M25552 Pain in left hip: Secondary | ICD-10-CM | POA: Diagnosis not present

## 2022-10-18 DIAGNOSIS — Z4789 Encounter for other orthopedic aftercare: Secondary | ICD-10-CM | POA: Diagnosis not present

## 2022-10-18 DIAGNOSIS — M25522 Pain in left elbow: Secondary | ICD-10-CM | POA: Diagnosis not present

## 2022-10-20 DIAGNOSIS — M25552 Pain in left hip: Secondary | ICD-10-CM | POA: Diagnosis not present

## 2022-10-20 DIAGNOSIS — M25522 Pain in left elbow: Secondary | ICD-10-CM | POA: Diagnosis not present

## 2022-10-20 DIAGNOSIS — Z4789 Encounter for other orthopedic aftercare: Secondary | ICD-10-CM | POA: Diagnosis not present

## 2022-10-21 ENCOUNTER — Encounter: Payer: Self-pay | Admitting: Family Medicine

## 2022-10-21 ENCOUNTER — Telehealth: Payer: Self-pay | Admitting: Family Medicine

## 2022-10-21 ENCOUNTER — Ambulatory Visit (INDEPENDENT_AMBULATORY_CARE_PROVIDER_SITE_OTHER): Payer: Medicare Other | Admitting: Family Medicine

## 2022-10-21 VITALS — BP 148/76 | HR 60 | Temp 98.0°F | Wt 196.2 lb

## 2022-10-21 DIAGNOSIS — R600 Localized edema: Secondary | ICD-10-CM

## 2022-10-21 DIAGNOSIS — L97909 Non-pressure chronic ulcer of unspecified part of unspecified lower leg with unspecified severity: Secondary | ICD-10-CM | POA: Insufficient documentation

## 2022-10-21 DIAGNOSIS — E871 Hypo-osmolality and hyponatremia: Secondary | ICD-10-CM

## 2022-10-21 HISTORY — DX: Non-pressure chronic ulcer of unspecified part of unspecified lower leg with unspecified severity: L97.909

## 2022-10-21 LAB — COMPREHENSIVE METABOLIC PANEL
ALT: 9 U/L (ref 0–53)
AST: 12 U/L (ref 0–37)
Albumin: 4.2 g/dL (ref 3.5–5.2)
Alkaline Phosphatase: 63 U/L (ref 39–117)
BUN: 11 mg/dL (ref 6–23)
CO2: 28 mEq/L (ref 19–32)
Calcium: 9.8 mg/dL (ref 8.4–10.5)
Chloride: 98 mEq/L (ref 96–112)
Creatinine, Ser: 0.72 mg/dL (ref 0.40–1.50)
GFR: 88.73 mL/min (ref >60.00)
Glucose, Bld: 128 mg/dL — ABNORMAL HIGH (ref 70–99)
Potassium: 4.6 mEq/L (ref 3.5–5.1)
Sodium: 133 mEq/L — ABNORMAL LOW (ref 135–145)
Total Bilirubin: 0.8 mg/dL (ref 0.2–1.2)
Total Protein: 6.8 g/dL (ref 6.0–8.3)

## 2022-10-21 LAB — BRAIN NATRIURETIC PEPTIDE: Pro B Natriuretic peptide (BNP): 186 pg/mL — ABNORMAL HIGH (ref 0.0–100.0)

## 2022-10-21 MED ORDER — FUROSEMIDE 20 MG PO TABS: 20.0000 mg | ORAL_TABLET | Freq: Every day | ORAL | 3 refills | Status: DC

## 2022-10-21 NOTE — Telephone Encounter (Signed)
Spoke with patient regarding results/recommendations.  

## 2022-10-21 NOTE — Telephone Encounter (Signed)
Please inform patient: Liver function, kidney function and electrolytes are all stable.  His sodium is still mildly low at 133.  His BNP marker is mildly elevated above normal.  Normal is less than 100 and he is at 186 today.  This is a very mild elevation, but it is higher than desired. Suggesting fluid overload.  Plan is the same for now take the Lasix up until Monday.  Weigh daily in the morning, before eating or drinking anything and before getting dressed. I suspect he will see about 3-5 pounds of weight loss with the use of Lasix.  Please have them call in Monday or Tuesday with weight and update, we will provide them with further plan at that time.

## 2022-10-21 NOTE — Patient Instructions (Signed)
No follow-ups on file.        Great to see you today.  I have refilled the medication(s) we provide.   If labs were collected, we will inform you of lab results once received either by echart message or telephone call.   - echart message- for normal results that have been seen by the patient already.   - telephone call: abnormal results or if patient has not viewed results in their echart.  

## 2022-10-21 NOTE — Progress Notes (Signed)
Mike Day , 1946/07/28, 77 y.o., male MRN: [id number] Patient Care Team    Relationship Specialty Notifications Start End  Ma Hillock, DO PCP - General Family Medicine  06/11/18   Sherren Mocha, MD PCP - Cardiology Cardiology  08/21/20   Rod Can, MD Consulting Physician Orthopedic Surgery  10/03/15   Sharmon Revere Physician Assistant Cardiology  04/29/16   Rolm Bookbinder, MD Consulting Physician Dermatology  08/24/17   Myrlene Broker, MD Attending Physician Urology  06/11/18   Warden Fillers, MD Consulting Physician Ophthalmology  06/11/18   Rosemarie Ax, MD Consulting Physician Sports Medicine  01/21/20   [name], MD Consulting Physician Orthopedic Surgery  07/11/22     Chief Complaint  Patient presents with   Leg Swelling    Both legs knee down;left side is worse most times; 2-3 weeks; has ulcer on right side.     Subjective: Mike Day is a 77 y.o. Pt presents for an OV with his wife to discuss lower extremity edema.  Patient reports he has noticed bilateral lower extremity edema that has been worsening over the last few months.   Patient's last echocardiogram was 2020.  He is established with cardiology.  He denies any orthopnea, dizziness or chest pain.  He has a history of hypertension which historically has been well treated with amlodipine 2.5 mg daily and lisinopril 20 mg daily.  He has gained some weight since last seen in November, some of which his wife states is because he is eating better. He also notes a right lateral ankle wound that has been present for couple weeks and  new left medial ankle skin change.     01/29/2022    9:33 AM 06/22/2021   11:03 AM 08/18/2020    9:26 AM 02/26/2019   12:13 PM 06/05/2018    8:55 AM  Depression screen PHQ 2/9  Decreased Interest 0 0 0 0 0  Down, Depressed, Hopeless 0 0 0 0 0  PHQ - 2 Score 0 0 0 0 0    Allergies  Allergen Reactions   Levofloxacin Rash   Neosporin  [Neomycin-Bacitracin Zn-Polymyx] Other (See Comments)    Dry/scaly skin/blistering    Tape Other (See Comments)    ADHESIVE TAPE; BLISTERS,   Social History   Social History Narrative   Married, 1 son, 2 grand-daughters.   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   Alcohol: 1 glass red wine per night.   Was once a long distance runner.   Past Medical History:  Diagnosis Date   Anemia    Early 20's   Atrial fibrillation ([place]) 10/07/2014   3-14 day cardiac monitor 03/2019:  AFib, Avg HR 69, no significant pauses, PVCs, wide complex runs (aberrancy vs NSVT - longest 32 beats).   Atrial flutter ([place]) 2008   Status post RFCA Dr Cristopher Peru 2008   Carotid artery plaque    Carotid US 8/21: Bilat ICA 1-39   Chronic atrial fibrillation (HCC)    Eliquis   Closed nondisplaced fracture of left patella 01/20/2020   Dermatochalasis of both upper eyelids 07/01/2014   Dysrhythmia    a fib    Echocardiograms    Echo 12/14 - Severe basal septal hypertrophy, moderate LVH, EF 60-65%, moderate LAE, mild RAE // Echo 7/20: EF 60-65, severe asymmetric LVH, mild RVE, normal RV SF, moderate BAE, moderate MAC, moderate aortic  valve calcification (noncoronary cusp), mild dilation of ascending aorta (41 mm)   Hx of adenomatous colonic polyps    Hyperlipidemia    Hypertension    Malignant neoplasm of prostate ([place]) 05/18/2012   Neuromuscular disorder ([place])    neuropathy feet   Patellar sleeve fracture of left knee 2021   Prostate cancer ([place]) 2006   Prostatectomy   Shingles 01/2019   left side of face/eye/neck/ear    SKIN CANCER, HX OF 03/11/2009   Moh's L hand for Squamous Cell;2 other squamous cells removed w/o Mohs 2013 Rolm Bookbinder MD  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   Past Surgical History:  Procedure Laterality Date   BLADDER SUSPENSION  2010   COLONOSCOPY W/ POLYPECTOMY  2003;2010;04/2014   Tubular adenoma: recall 5 yrs (after 04/2019)    EYE SURGERY Bilateral 2021,2022   FEMUR IM NAIL Left 07/12/2022   Procedure: INTRAMEDULLARY (IM) NAIL FEMORAL;  Surgeon: [name], MD;  Location: [facility];  Service: Orthopedics;  Laterality: Left;   HERNIA REPAIR  07/2008   Dr.Martin   INGUINAL HERNIA REPAIR Left 08/25/2020   Procedure: OPEN LEFT INGUINAL HERNIA REPAIR WITH MESH;  Surgeon: Johnathan Hausen, MD;  Location: [facility];  Service: General;  Laterality: Left;   MOHS SURGERY     left hand   MOHS SURGERY Left 12/2020   ear   ORIF ELBOW FRACTURE Left 07/14/2022   Procedure: OPEN REDUCTION INTERNAL FIXATION (ORIF) OF LEFT ELBOW;  Surgeon: [name], MD;  Location: [facility];  Service: Orthopedics;  Laterality: Left;   PROSTATECTOMY  2006   Robotic for adenocarcinoma; Dr Lawerance Bach   RADIOFREQUENCY ABLATION  01/15/2007   for ectopic atrial foci   SEPTOPLASTY  Age 76   TRANSTHORACIC ECHOCARDIOGRAM  09/02/2013   Normal.  EF 60%.  LAE and RAE.   XI ROBOTIC ASSISTED INGUINAL HERNIA REPAIR WITH MESH Right 01/11/2021   Procedure: XI ROBOTIC ASSISTED  RIGHT INGUINAL HERNIA REPAIR WITH MESH;  Surgeon: Johnathan Hausen, MD;  Location: [facility];  Service: General;  Laterality: Right;   Family History  Problem Relation Age of Onset   Lung disease Father        Black Lung   Heart disease Father        Congenital   Emphysema Father    COPD Father    Diabetes Mother    Stroke Mother 59   Hypertension Brother    Heart disease Brother    Hyperlipidemia Brother    Heart attack Brother 79       sudden death   Cancer Neg Hx    Colon cancer Neg Hx    Esophageal cancer Neg Hx    Rectal cancer Neg Hx    Stomach cancer Neg Hx    Allergies as of 10/21/2022       Reactions   Levofloxacin Rash   Neosporin [neomycin-bacitracin Zn-polymyx] Other (See Comments)   Dry/scaly skin/blistering    Tape Other (See Comments)   ADHESIVE TAPE; BLISTERS,        Medication List        Accurate as of October 21, 2022 12:53 PM. If you have  any questions, ask your nurse or doctor.          STOP taking these medications    ARTIFICIAL TEAR SOLUTION OP Stopped by: Howard Pouch, DO   HYDROcodone-acetaminophen 5-325 MG tablet Commonly known as: NORCO/VICODIN Stopped by: Howard Pouch, DO   polyethylene glycol 17  g packet Commonly known as: MIRALAX / GLYCOLAX Stopped by: Howard Pouch, DO   senna-docusate 8.6-50 MG tablet Commonly known as: Senokot-S Stopped by: Howard Pouch, DO   zolpidem 5 MG tablet Commonly known as: AMBIEN Stopped by: Howard Pouch, DO       TAKE these medications    acetaminophen 500 MG tablet Commonly known as: TYLENOL Take 1,000 mg by mouth every 8 (eight) hours as needed for moderate pain.   alendronate 70 MG tablet Commonly known as: FOSAMAX Take 1 tablet (70 mg total) by mouth every 7 (seven) days. Take with a full glass of water on an empty stomach.   amLODipine 2.5 MG tablet Commonly known as: NORVASC Take 1 tablet (2.5 mg total) by mouth daily.   apixaban 5 MG Tabs tablet Commonly known as: Eliquis Take 1 tablet (5 mg total) by mouth 2 (two) times daily.   cetaphil cream Apply 1 application topically daily.   cholecalciferol 25 MCG (1000 UNIT) tablet Commonly known as: VITAMIN D3 Take 2 tablets (2,000 Units total) by mouth daily.   furosemide 20 MG tablet Commonly known as: LASIX Take 1 tablet (20 mg total) by mouth daily. Started by: Howard Pouch, DO   lisinopril 20 MG tablet Commonly known as: ZESTRIL Take 1 tablet (20 mg total) by mouth daily.   simvastatin 20 MG tablet Commonly known as: ZOCOR Take 1 tablet (20 mg total) by mouth at bedtime.   triamcinolone 55 MCG/ACT Aero nasal inhaler Commonly known as: NASACORT Place 1 spray into the nose daily as needed (allergies).        All past medical history, surgical history, allergies, family history, immunizations andmedications were updated in the EMR today and reviewed under the history and medication  portions of their EMR.     ROS Negative, with the exception of above mentioned in HPI   Objective:  BP (!) 148/76   Pulse 60   Temp 98 F (36.7 C)   Wt 196 lb 3.2 oz (89 kg)   SpO2 98%   BMI 24.52 kg/m  Body mass index is 24.52 kg/m. Physical Exam Vitals and nursing note reviewed.  Constitutional:      General: He is not in acute distress.    Appearance: Normal appearance. He is not ill-appearing, toxic-appearing or diaphoretic.  HENT:     Head: Normocephalic and atraumatic.  Eyes:     General: No scleral icterus.       Right eye: No discharge.        Left eye: No discharge.     Extraocular Movements: Extraocular movements intact.     Pupils: Pupils are equal, round, and reactive to light.  Cardiovascular:     Rate and Rhythm: Normal rate and regular rhythm.     Heart sounds: No murmur heard. Pulmonary:     Effort: Pulmonary effort is normal. No respiratory distress.     Breath sounds: Normal breath sounds. No wheezing, rhonchi or rales.  Musculoskeletal:     Right lower leg: Edema (+1) present.     Left lower leg: Edema (+1) present.  Skin:    General: Skin is warm.     Findings: No rash.     Comments: Right lower extremity: Healing ulceration right inferior ankle.  No drainage present.  No erythema. Left lower extremity: Small area of erythema and skin breakdown left inferior medial ankle.  No drainage  Neurological:     Mental Status: He is alert and oriented to person, place, and  time. Mental status is at baseline.  Psychiatric:        Mood and Affect: Mood normal.        Behavior: Behavior normal.        Thought Content: Thought content normal.        Judgment: Judgment normal.     No results found. No results found. No results found for this or any previous visit (from the past 24 hour(s)).  Assessment/Plan: HANDY FLAVIN is a 77 y.o. male present for OV for  Bilateral lower extremity edema/hyponatremia -CMP - Brain natriuretic peptide Patient  appears mildly fluid overloaded today. Discussed starting Lasix 20 mg daily the next 3 days.  Monitor weights during this time. Patient will be called on Monday with laboratory results. If edema is reported to be resolved, will attempt to use Lasix on a as needed basis. Encourage patient to schedule with cardiology, updated echocardiogram may be warranted.  Ulcer of lower extremity, unspecified laterality, unspecified ulcer stage (HCC) Skin breakdown is possibly related to increased lower extremity edema and rubbing of his shoes secondary to the lower extremity edema. We will refer to wound care to help with ulceration care. In the meantime BB ointment and keep covered with Telfa to avoid further rubbing.  Reviewed expectations re: course of current medical issues. Discussed self-management of symptoms. Outlined signs and symptoms indicating need for more acute intervention. Patient verbalized understanding and all questions were answered. Patient received an After-Visit Summary.    Orders Placed This Encounter  Procedures   Comp Met (CMET)   Brain natriuretic peptide   Ambulatory referral to [place] ordered this encounter  Medications   furosemide (LASIX) 20 MG tablet    Sig: Take 1 tablet (20 mg total) by mouth daily.    Dispense:  30 tablet    Refill:  3   Referral Orders         Ambulatory referral to Wound Clinic       Note is dictated utilizing voice recognition software. Although note has been proof read prior to signing, occasional typographical errors still can be missed. If any questions arise, please do not hesitate to call for verification.   electronically signed by:  Howard Pouch, DO  [place]

## 2022-10-24 ENCOUNTER — Telehealth: Payer: Self-pay | Admitting: Family Medicine

## 2022-10-24 ENCOUNTER — Telehealth: Payer: Self-pay | Admitting: Cardiovascular Disease

## 2022-10-24 DIAGNOSIS — I4821 Permanent atrial fibrillation: Secondary | ICD-10-CM

## 2022-10-24 DIAGNOSIS — R609 Edema, unspecified: Secondary | ICD-10-CM

## 2022-10-24 NOTE — Telephone Encounter (Signed)
Pt was advised to contact his cardiologist regarding fluid retention. He was told the earliest he can get in is April 10th. He would like to know if there is anyway we can call to make the appointment urgent or if he needs to schedule and appointment here. Please give the patient a call. He declined scheduling when offered to scheduled with me.

## 2022-10-24 NOTE — Telephone Encounter (Signed)
Continue lasix one daily until swelling resolves.  F/u with cardiology to discuss new fluid retention and mild elevation in BNP> they may elect to repeat echo

## 2022-10-24 NOTE — Telephone Encounter (Signed)
Pt c/o swelling: STAT is pt has developed SOB within 24 hours  How much weight have you gained and in what time span? 10 lb in two weeks   If swelling, where is the swelling located?  From knees to toes   Are you currently taking a fluid pill? Yes   Are you currently SOB? No  Do you have a log of your daily weights (if so, list)? N/A  Have you gained 3 pounds in a day or 5 pounds in a week? N/A  Have you traveled recently? No

## 2022-10-24 NOTE — Telephone Encounter (Signed)
Please see other encounter.

## 2022-10-24 NOTE — Telephone Encounter (Signed)
Patient reporting in his weight update per Dr. Lucita Lora recommendations from Friday 2/16. His weight yesterday 2/18  - 190.00 He reported that his legs and feet were extremely swollen. His weight today 2/19 - 191.9 He reported legs and feet still swollen but definitely not as bad as yesterday.  Please advise. [phone]

## 2022-10-24 NOTE — Telephone Encounter (Signed)
Spoke with pt and pt's wife Pt has had noted some pitting edema to lower legs and feet Per wife BNP done Friday was 186  Pt has hx of low sodium as well as low chloride  Pt currently taking Furosemide 20 mg   Weight Sun was  190 and this am 191.6  Pt has had several surgeries in the last couple of months as well Will forward to Dr Burt Knack for review

## 2022-10-24 NOTE — Telephone Encounter (Signed)
Spoke with patient regarding results/recommendations.  

## 2022-10-25 DIAGNOSIS — M25552 Pain in left hip: Secondary | ICD-10-CM | POA: Diagnosis not present

## 2022-10-25 DIAGNOSIS — Z4789 Encounter for other orthopedic aftercare: Secondary | ICD-10-CM | POA: Diagnosis not present

## 2022-10-25 DIAGNOSIS — M25522 Pain in left elbow: Secondary | ICD-10-CM | POA: Diagnosis not present

## 2022-10-25 NOTE — Telephone Encounter (Signed)
Attempted to return call to wife and/or patient, but no answer. Left message asking that they call back to review recommendations of Dr Antionette Char. Patient placed on Scott Weaver's schedule 11/02/22.

## 2022-10-25 NOTE — Telephone Encounter (Signed)
Spoke with patient regarding results/recommendations.   I was able to get pt scheduled with a PA on Tuesday 02/27@ 8:15AM. Pt's wife states that if it is not with Dr. Nicki Reaper then they will wait because she is a nurse and prefers to have Dr. Nicki Reaper care for pt at this time. I advised them to call cardiology to see if they are okay with the PA that he is scheduled with and if not to cancel the appointment at that time.

## 2022-10-25 NOTE — Telephone Encounter (Signed)
Patient returned RN's call. 

## 2022-10-25 NOTE — Telephone Encounter (Signed)
Returned call to patient. ECHO ordered and scheduled for Friday 10/28/22. Appt with [name], PA scheduled for Wednesday 11/02/22. He verbalized understanding to hold Amlodipine until after visit with Nicki Reaper and will continue his Lasix at 16m daily. No further questions.

## 2022-10-25 NOTE — Telephone Encounter (Signed)
Recommend updated echo and cards office visit with me or APP to evaluate. Continue furosemide 20 mg daily, hold amlodipine which can exacerbate leg swelling.

## 2022-10-27 DIAGNOSIS — M25552 Pain in left hip: Secondary | ICD-10-CM | POA: Diagnosis not present

## 2022-10-27 DIAGNOSIS — Z4789 Encounter for other orthopedic aftercare: Secondary | ICD-10-CM | POA: Diagnosis not present

## 2022-10-27 DIAGNOSIS — M25522 Pain in left elbow: Secondary | ICD-10-CM | POA: Diagnosis not present

## 2022-10-28 ENCOUNTER — Ambulatory Visit (HOSPITAL_COMMUNITY): Payer: Medicare Other | Attending: Cardiology

## 2022-10-28 DIAGNOSIS — R609 Edema, unspecified: Secondary | ICD-10-CM | POA: Diagnosis not present

## 2022-10-28 DIAGNOSIS — I4821 Permanent atrial fibrillation: Secondary | ICD-10-CM | POA: Diagnosis not present

## 2022-10-28 LAB — ECHOCARDIOGRAM COMPLETE
AR max vel: 2.28 cm2
AV Area VTI: 2.23 cm2
AV Area mean vel: 2.19 cm2
AV Mean grad: 6 mmHg
AV Peak grad: 10.8 mmHg
Ao pk vel: 1.64 m/s
Est EF: 60
S' Lateral: 2.9 cm

## 2022-11-01 ENCOUNTER — Ambulatory Visit: Payer: Medicare Other | Admitting: Physician Assistant

## 2022-11-01 NOTE — Progress Notes (Unsigned)
Cardiology Office Note:   Date:  11/02/2022  ID:  Mike Day, DOB 04-Dec-1945, MRN [id number]  Patient Profile: AFlutter s/p RFCA Permanent AFib CHADS2-VASc=3 (DM, HTN, age x1). Apixaban Rx Diabetes mellitus Hypertension  Hyperlipidemia  Prostate CA s/p prostatectomy LVH TTE 08/2013: severe basal septal LVH TTE 03/2019: severe asymmetric LVH, EF 60-65, RVSP 24.6 TTE 10/28/22: EF 60, no RWMA, severe asymmetric LVH, NL RVSF, severe RVE, mild LAE, severe RAE, mild MR, AV sclerosis, asc aorta 41 mm, RAP 8 Event monitor 03/2019: AFib, Avg HR 69, PVCs, wide complex rhythm (NSVT vs aberrancy) Carotid Dz [place] 03/2019: Bilat ICA 1-39 [place] 8/21: bilat ICA 1-39 Aortic atherosclerosis (CT 12/2019)       History of Present Illness:   Mike Day is a 77 y.o. male with the above problem list.  Mike Day was last seen in 12/2021. He had a fall in 07/2022 and was admitted for L hip fx s/p IM nailing and L olecranon fx. He called in recently with leg edema. His Amlodipine was held and he was started on Lasix. A f/u echocardiogram was obtained which demonstrated NL EF. He had seen his PCP and BNP was mildly elevated at 186. He returns for evaluation of leg edema. He is here with his wife. His legs started to swell about 3 weeks ago. This was about the time he started to become more active. He was in [place] rehab for a while after his fall. He has not had chest pain, shortness of breath, syncope, orthopnea, dizziness.   EKG: AFib, HR 75, normal axis, PVCs, no STTW changes      Review of Systems  Gastrointestinal:  Negative for hematochezia and melena.  Genitourinary:  Negative for hematuria.    Objective   Labs/Other Test Reviewed:     Risk Assessment/Calculations/Metrics:    CHA2DS2-VASc Score = 5   This indicates a 7.2% annual risk of stroke. The patient's score is based upon: CHF History: 0 HTN History: 1 Diabetes History: 1 Stroke History: 0 Vascular Disease History: 1 Age Score: 2 Gender  Score: 0            Physical Exam:   VS:  BP 136/76   Pulse 86   Ht '6\' 3"'$  (1.905 m)   Wt 195 lb 9.6 oz (88.7 kg)   SpO2 95%   BMI 24.45 kg/m    Wt Readings from Last 3 Encounters:  11/02/22 195 lb 9.6 oz (88.7 kg)  10/21/22 196 lb 3.2 oz (89 kg)  08/15/22 188 lb (85.3 kg)    Constitutional:      Appearance: Healthy appearance. Not in distress.  Neck:     Vascular: JVD normal.  Pulmonary:     Breath sounds: Normal breath sounds. No rales.  Cardiovascular:     Regularly irregular rhythm. Normal S1. Normal S2.      Murmurs: There is no murmur.     No gallop.   Edema:    Peripheral edema present.    Pretibial: bilateral 1+ edema of the pretibial area.    Ankle: bilateral 1+ edema of the ankle. Abdominal:     Palpations: Abdomen is soft. There is no hepatomegaly.  Skin:    Comments: R lat malleolus eschar; L medial malleolus very small scab      Assessment & Plan    ASSESSMENT & PLAN:   Bilateral lower extremity edema He has a lot of visible varicose veins bilaterally. I suspect he has venous insufficiency contributing to his edema.  The onset seems to coincide with him increasing activity since his hip fracture. He notes he was supine most of the time prior to this. He has what appears to be a venous stasis ulcer over his R lat malleolus. He has an appt with the wound clinic next week. I think compression hose will help significantly. But I would wait until he sees the wound clinic to make sure his wound is covered well. He has had some improvement with the Lasix. He has a hx of hyponatremia. I will repeat his BMET today to see if we can adjust his Lasix any further. His BNP was likely mildly elevated in the setting chronic atrial fibrillation. He has no symptoms of HF and his exam does not suggest HF. His EF was normal on Echocardiogram. He has a dilated RV but NL RV systolic function.  Continue to hold Amlodipine for now Continue Lasix 20 mg once daily  BMET, TSH today. If  SCr, K+ stable, consider increasing Lasix to 40 mg  F/u with wound clinic as planned Keep legs elevated as much as possible  Permanent atrial fibrillation ([place]) Rate is well controlled. He is tolerating anticoagulation. No bleeding issues. His most recent Hgb was much improved at 12.1 and serum creatinine was normal. Continue Apixaban 5 mg twice daily.   Essential hypertension BP is well controlled despite holding the Amlodipine. Continue to hold the Amlodipine. Continue on Lisinopril 20 mg once daily.   PVC's (premature ventricular contractions) He has several PVCs on his EKG today. I don't think these are aberrant beats. EF was normal on recent echocardiogram. I will get a BMET and Magnesium today. If K+, Magnesium normal, I will get a Zio x 3 days to assess PVC burden.  LVH (left ventricular hypertrophy) I will review his echocardiogram with one of our structural readers to see if there is anything else we should be concerned about. At this point, I don't think he needs a workup for Amyloid.         Dispo:  Return in about 8 weeks (around 12/28/2022) for Routine Follow Up, w/ Mike Dopp, PA-C.   Signed, Mike Dopp, PA-C  11/02/2022 9:53 AM    [facility] [place], [place], [place]  [address] Phone: [phone]; Fax: [phone]

## 2022-11-02 ENCOUNTER — Encounter: Payer: Self-pay | Admitting: Physician Assistant

## 2022-11-02 ENCOUNTER — Ambulatory Visit: Payer: Medicare Other | Attending: Physician Assistant | Admitting: Physician Assistant

## 2022-11-02 VITALS — BP 136/76 | HR 86 | Ht 75.0 in | Wt 195.6 lb

## 2022-11-02 DIAGNOSIS — I4821 Permanent atrial fibrillation: Secondary | ICD-10-CM | POA: Insufficient documentation

## 2022-11-02 DIAGNOSIS — I10 Essential (primary) hypertension: Secondary | ICD-10-CM | POA: Insufficient documentation

## 2022-11-02 DIAGNOSIS — I517 Cardiomegaly: Secondary | ICD-10-CM

## 2022-11-02 DIAGNOSIS — H40023 Open angle with borderline findings, high risk, bilateral: Secondary | ICD-10-CM | POA: Diagnosis not present

## 2022-11-02 DIAGNOSIS — R600 Localized edema: Secondary | ICD-10-CM | POA: Insufficient documentation

## 2022-11-02 DIAGNOSIS — I493 Ventricular premature depolarization: Secondary | ICD-10-CM | POA: Insufficient documentation

## 2022-11-02 DIAGNOSIS — H16221 Keratoconjunctivitis sicca, not specified as Sjogren's, right eye: Secondary | ICD-10-CM | POA: Diagnosis not present

## 2022-11-02 HISTORY — DX: Cardiomegaly: I51.7

## 2022-11-02 NOTE — Assessment & Plan Note (Signed)
BP is well controlled despite holding the Amlodipine. Continue to hold the Amlodipine. Continue on Lisinopril 20 mg once daily.

## 2022-11-02 NOTE — Assessment & Plan Note (Signed)
He has several PVCs on his EKG today. I don't think these are aberrant beats. EF was normal on recent echocardiogram. I will get a BMET and Magnesium today. If K+, Magnesium normal, I will get a Zio x 3 days to assess PVC burden.

## 2022-11-02 NOTE — Assessment & Plan Note (Signed)
He has a lot of visible varicose veins bilaterally. I suspect he has venous insufficiency contributing to his edema. The onset seems to coincide with him increasing activity since his hip fracture. He notes he was supine most of the time prior to this. He has what appears to be a venous stasis ulcer over his R lat malleolus. He has an appt with the wound clinic next week. I think compression hose will help significantly. But I would wait until he sees the wound clinic to make sure his wound is covered well. He has had some improvement with the Lasix. He has a hx of hyponatremia. I will repeat his BMET today to see if we can adjust his Lasix any further. His BNP was likely mildly elevated in the setting chronic atrial fibrillation. He has no symptoms of HF and his exam does not suggest HF. His EF was normal on Echocardiogram. He has a dilated RV but NL RV systolic function.  Continue to hold Amlodipine for now Continue Lasix 20 mg once daily  BMET, TSH today. If SCr, K+ stable, consider increasing Lasix to 40 mg  F/u with wound clinic as planned Keep legs elevated as much as possible

## 2022-11-02 NOTE — Assessment & Plan Note (Signed)
I will review his echocardiogram with one of our structural readers to see if there is anything else we should be concerned about. At this point, I don't think he needs a workup for Amyloid.

## 2022-11-02 NOTE — Patient Instructions (Signed)
Medication Instructions:  Your physician recommends that you continue on your current medications as directed. Please refer to the Current Medication list given to you today.  *If you need a refill on your cardiac medications before your next appointment, please call your pharmacy*   Lab Work: TODAY:  BMET & TSH  If you have labs (blood work) drawn today and your tests are completely normal, you will receive your results only by: [place] (if you have MyChart) OR A paper copy in the mail If you have any lab test that is abnormal or we need to change your treatment, we will call you to review the results.   Testing/Procedures: None ordered   Follow-Up: At [facility], you and your health needs are our priority.  As part of our continuing mission to provide you with exceptional heart care, we have created designated Provider Care Teams.  These Care Teams include your primary Cardiologist (physician) and Advanced Practice Providers (APPs -  Physician Assistants and Nurse Practitioners) who all work together to provide you with the care you need, when you need it.  We recommend signing up for the patient portal called "MyChart".  Sign up information is provided on this After Visit Summary.  MyChart is used to connect with patients for Virtual Visits (Telemedicine).  Patients are able to view lab/test results, encounter notes, upcoming appointments, etc.  Non-urgent messages can be sent to your provider as well.   To learn more about what you can do with MyChart, go to [email or web address].    Your next appointment:   6 week(s)  Provider:   [name], PA-C         Other Instructions

## 2022-11-02 NOTE — Assessment & Plan Note (Signed)
Rate is well controlled. He is tolerating anticoagulation. No bleeding issues. His most recent Hgb was much improved at 12.1 and serum creatinine was normal. Continue Apixaban 5 mg twice daily.

## 2022-11-03 LAB — BASIC METABOLIC PANEL
BUN/Creatinine Ratio: 14 (ref 10–24)
BUN: 12 mg/dL (ref 8–27)
CO2: 23 mmol/L (ref 20–29)
Calcium: 9.9 mg/dL (ref 8.6–10.2)
Chloride: 96 mmol/L (ref 96–106)
Creatinine, Ser: 0.85 mg/dL (ref 0.76–1.27)
Glucose: 136 mg/dL — ABNORMAL HIGH (ref 70–99)
Potassium: 4.5 mmol/L (ref 3.5–5.2)
Sodium: 136 mmol/L (ref 134–144)
eGFR: 90 mL/min/{1.73_m2} (ref >59)

## 2022-11-03 LAB — TSH: TSH: 1.97 u[IU]/mL (ref 0.450–4.500)

## 2022-11-03 LAB — MAGNESIUM: Magnesium: 2.1 mg/dL (ref 1.6–2.3)

## 2022-11-04 ENCOUNTER — Ambulatory Visit: Payer: Medicare Other | Attending: Physician Assistant

## 2022-11-04 ENCOUNTER — Telehealth: Payer: Self-pay

## 2022-11-04 DIAGNOSIS — Z79899 Other long term (current) drug therapy: Secondary | ICD-10-CM

## 2022-11-04 DIAGNOSIS — I517 Cardiomegaly: Secondary | ICD-10-CM

## 2022-11-04 DIAGNOSIS — I4821 Permanent atrial fibrillation: Secondary | ICD-10-CM

## 2022-11-04 DIAGNOSIS — I493 Ventricular premature depolarization: Secondary | ICD-10-CM

## 2022-11-04 MED ORDER — FUROSEMIDE 20 MG PO TABS: 20.0000 mg | ORAL_TABLET | Freq: Every day | ORAL | 3 refills | Status: DC

## 2022-11-04 NOTE — Telephone Encounter (Signed)
-----   Message from Liliane Shi, [place] sent at 11/03/2022 11:43 AM EST ----- Creatinine, K+, Magnesium normal  It is ok to try increasing Lasix to see if this helps his swelling more. Reviewed his echocardiogram with one of our Cardiologists that reads echocardiograms. To better evaluate, I would like him to get a cardiac MRI. Since his K+, Magnesium are normal, I would also like to get a monitor. 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

## 2022-11-04 NOTE — Progress Notes (Unsigned)
Enrolled patient for a 3 day Zio XT monitor to be mailed to patients home  Dr Burt Knack to read

## 2022-11-04 NOTE — Telephone Encounter (Signed)
Called to review results and recommendations with patient. Patient verbalizes understanding to increase Lasix to 40 mg once daily x 1 week, then reduce back to 20 mg once daily in order to treat swelling, agrees to BMET in 1 week. Orders placed, labs scheduled. Zio XT monitor x 3 days ordered. Education provided. Patient states he has completed home monitoring before so he does not need Mychart instructions sent to him. Cardiac MRI ordered per [name].

## 2022-11-09 DIAGNOSIS — I493 Ventricular premature depolarization: Secondary | ICD-10-CM

## 2022-11-09 DIAGNOSIS — I4821 Permanent atrial fibrillation: Secondary | ICD-10-CM | POA: Diagnosis not present

## 2022-11-11 ENCOUNTER — Ambulatory Visit: Payer: Medicare Other | Attending: Cardiovascular Disease

## 2022-11-11 ENCOUNTER — Encounter (HOSPITAL_BASED_OUTPATIENT_CLINIC_OR_DEPARTMENT_OTHER): Payer: Medicare Other | Attending: General Surgery | Admitting: General Surgery

## 2022-11-11 DIAGNOSIS — R600 Localized edema: Secondary | ICD-10-CM | POA: Insufficient documentation

## 2022-11-11 DIAGNOSIS — L97322 Non-pressure chronic ulcer of left ankle with fat layer exposed: Secondary | ICD-10-CM | POA: Insufficient documentation

## 2022-11-11 DIAGNOSIS — Z9889 Other specified postprocedural states: Secondary | ICD-10-CM | POA: Diagnosis not present

## 2022-11-11 DIAGNOSIS — I83003 Varicose veins of unspecified lower extremity with ulcer of ankle: Secondary | ICD-10-CM | POA: Insufficient documentation

## 2022-11-11 DIAGNOSIS — Z79899 Other long term (current) drug therapy: Secondary | ICD-10-CM | POA: Diagnosis not present

## 2022-11-11 DIAGNOSIS — L97312 Non-pressure chronic ulcer of right ankle with fat layer exposed: Secondary | ICD-10-CM | POA: Diagnosis not present

## 2022-11-11 DIAGNOSIS — I872 Venous insufficiency (chronic) (peripheral): Secondary | ICD-10-CM | POA: Diagnosis not present

## 2022-11-11 DIAGNOSIS — I4821 Permanent atrial fibrillation: Secondary | ICD-10-CM | POA: Insufficient documentation

## 2022-11-12 LAB — HEMOGLOBIN AND HEMATOCRIT, BLOOD
Hematocrit: 40.4 % (ref 37.5–51.0)
Hemoglobin: 13.3 g/dL (ref 13.0–17.7)

## 2022-11-12 LAB — BASIC METABOLIC PANEL
BUN/Creatinine Ratio: 14 (ref 10–24)
BUN: 12 mg/dL (ref 8–27)
CO2: 26 mmol/L (ref 20–29)
Calcium: 9.9 mg/dL (ref 8.6–10.2)
Chloride: 95 mmol/L — ABNORMAL LOW (ref 96–106)
Creatinine, Ser: 0.83 mg/dL (ref 0.76–1.27)
Glucose: 166 mg/dL — ABNORMAL HIGH (ref 70–99)
Potassium: 3.9 mmol/L (ref 3.5–5.2)
Sodium: 137 mmol/L (ref 134–144)
eGFR: 91 mL/min/{1.73_m2} (ref >59)

## 2022-11-12 IMAGING — DX DG THORACIC SPINE 3V
3 series · 3 of 3 positions shown · non-contrast
Comparison: CT abdomen done on 01/01/2020

CLINICAL DATA: Trauma, fall, back pain

EXAM:
THORACIC SPINE - 3 VIEWS

[t-spine ap]
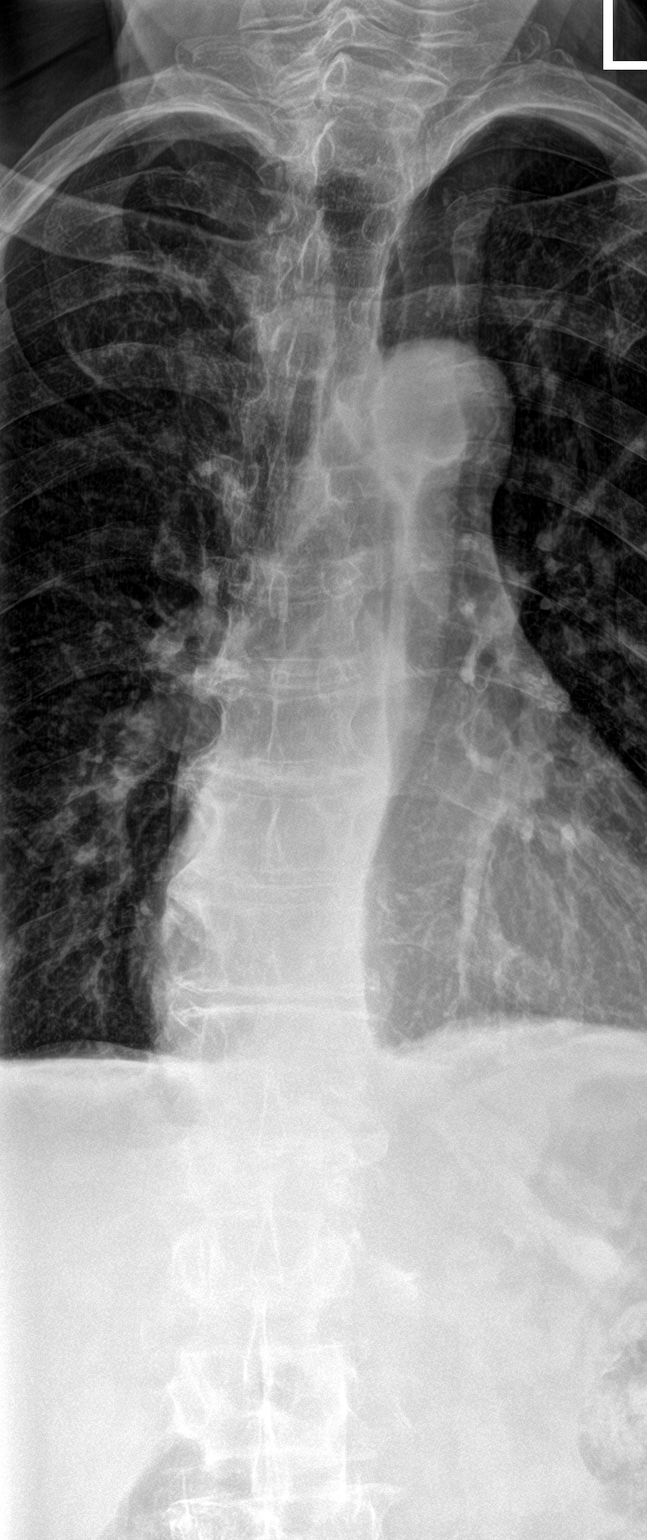

[t-spine lat]
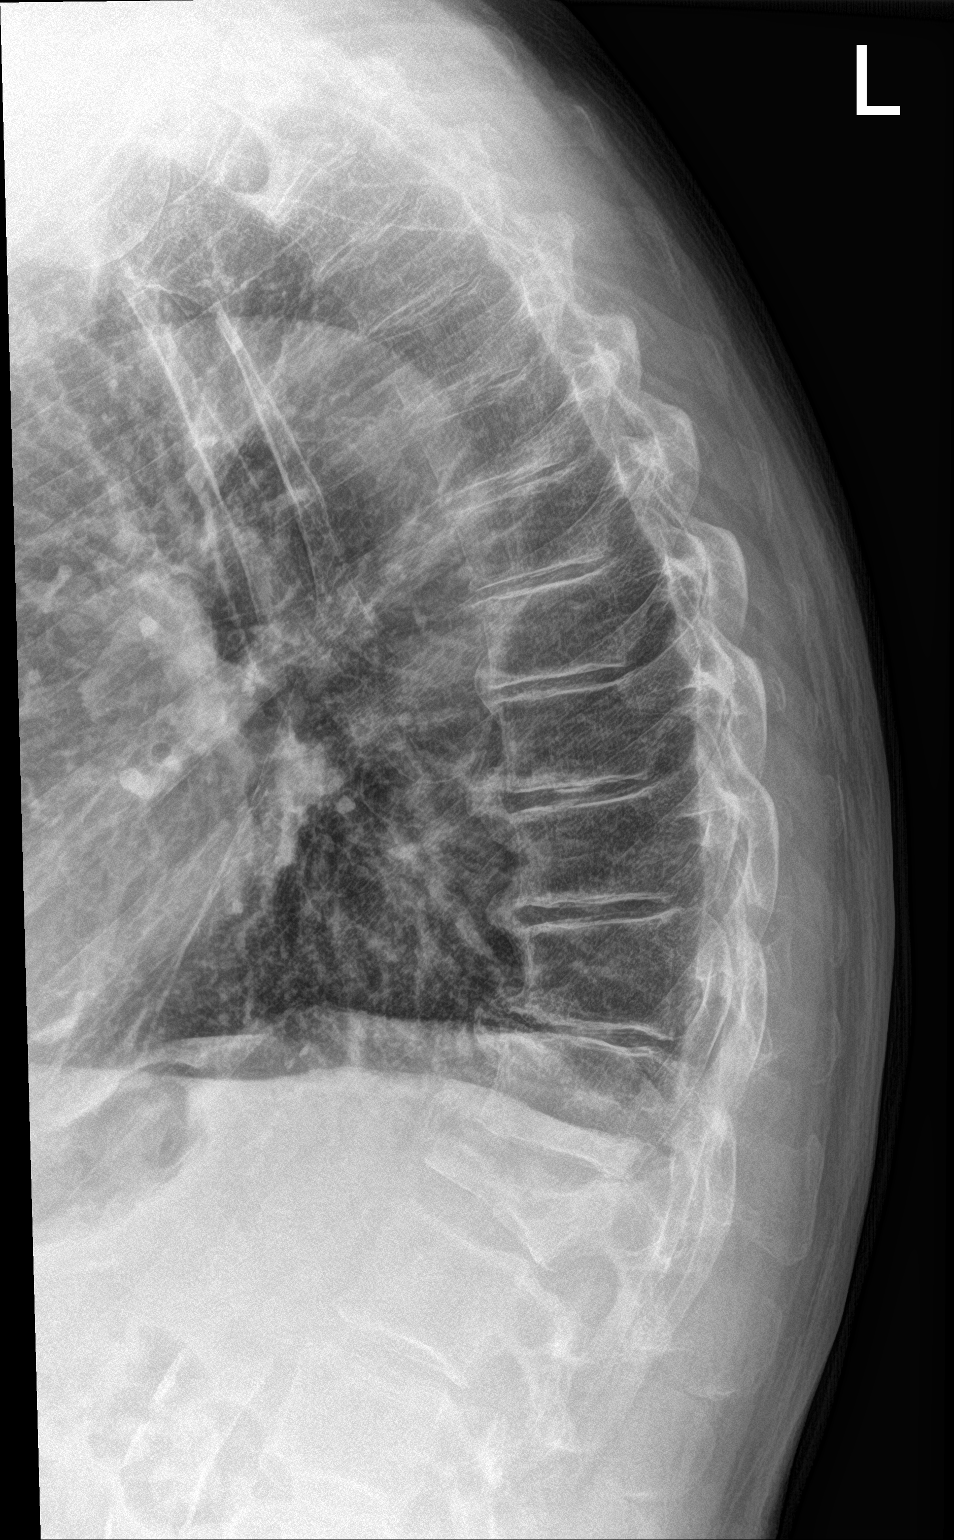

[t-spine swimmers]
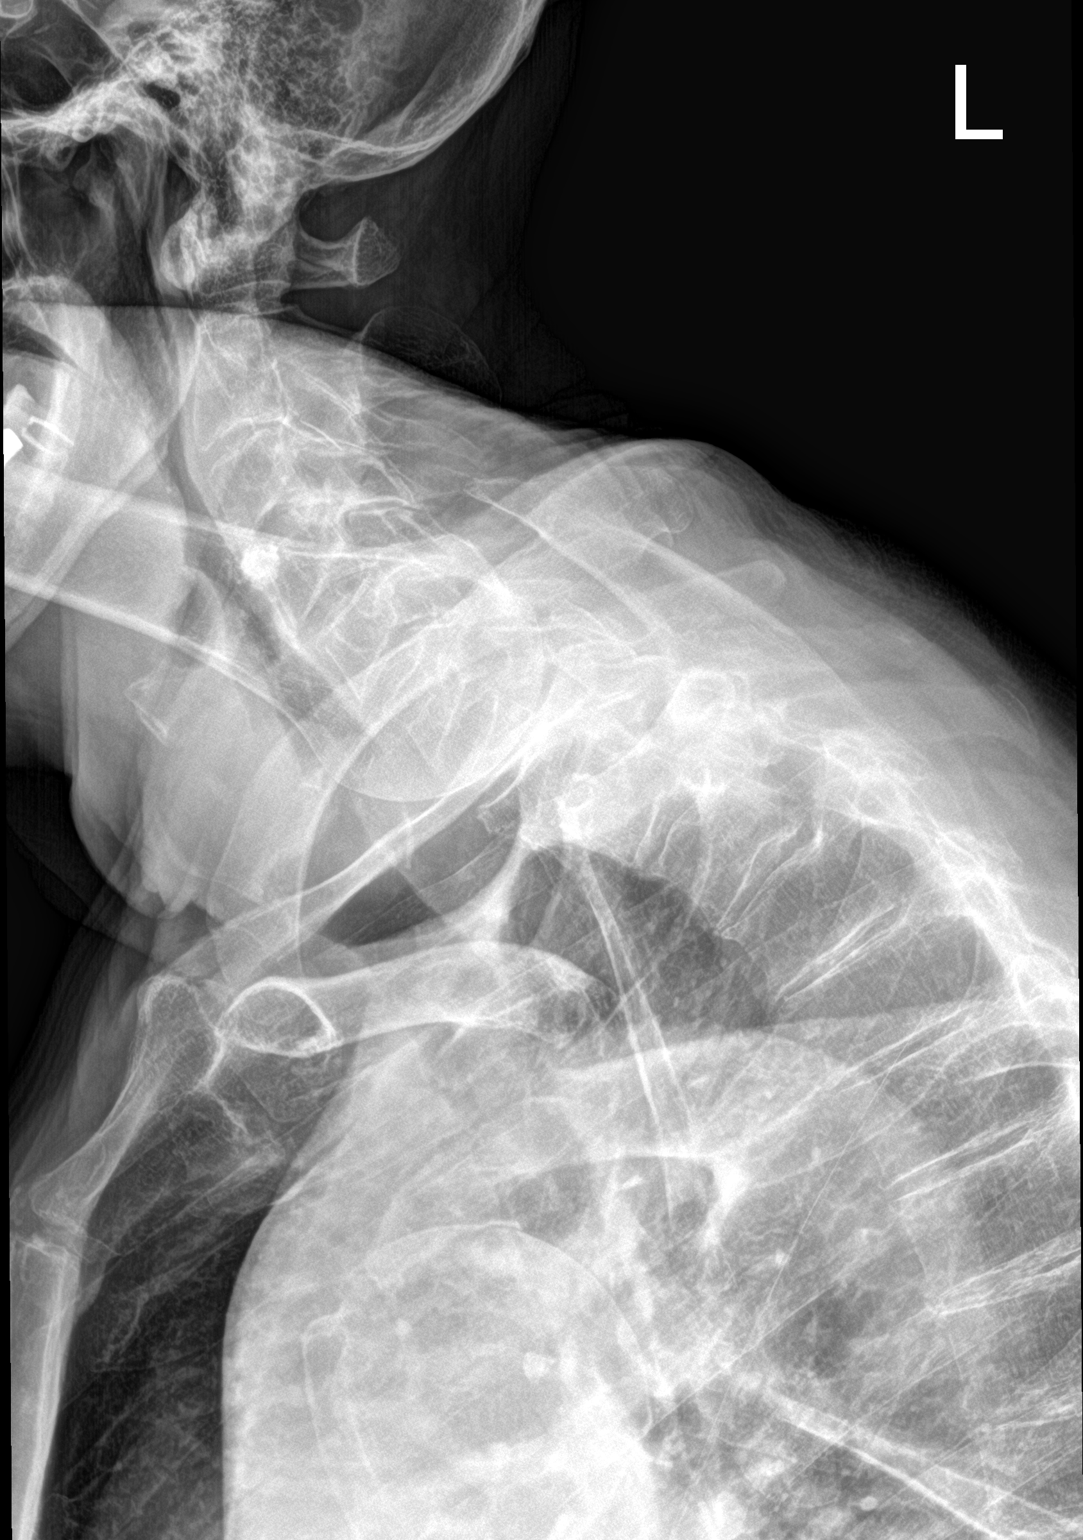

[3 of 3 positions shown; findings below may reference images not displayed]

FINDINGS: There is marked compression fracture in 1 of the vertebral bodies at
thoracolumbar junction with a proximally 70% decrease in height of
upper endplate. There is possible retropulsion of upper endplate of
the vertebra with compression fracture. Paraspinal soft tissues are
unremarkable.
IMPRESSION: There is marked compression of upper endplate of 1 of the vertebral
bodies at thoracolumbar junction, possibly L1 vertebra with
approximately 70% decrease in height of upper endplate and
retropulsion of posterior margin of upper endplate. This finding is
new in comparison with CT abdomen done on 01/01/2020.

## 2022-11-12 NOTE — Progress Notes (Signed)
DASHAN, KINLOCK ([id number]) [id number] Page 1 of 10 Visit Report for 11/11/2022 Chief Complaint Document Details Patient Name: Date of Service: Mike [name] Major CK F. 11/11/2022 9:00 A M Medical Record Number: [id number] Patient Account Number: [id number] Date of Birth/Sex: Treating RN: 1945-09-30 (77 y.o. M) Primary Care Provider: Howard Pouch Other Clinician: Referring Provider: Treating Provider/Extender: Reginia Naas, Renee Weeks in Treatment: 0 Information Obtained from: Patient Chief Complaint Patient presents for treatment of open ulcers due to venous insufficiency Electronic Signature(s) Signed: 11/11/2022 10:23:25 AM By: Fredirick Maudlin MD FACS Previous Signature: 11/11/2022 8:59:30 AM Version By: Fredirick Maudlin MD FACS Entered By: Fredirick Maudlin on 11/11/2022 10:23:25 -------------------------------------------------------------------------------- Debridement Details Patient Name: Date of Service: Mike [name] [name], [name] 11/11/2022 9:00 A M Medical Record Number: [id number] Patient Account Number: [id number] Date of Birth/Sex: Treating RN: 1946/03/17 (77 y.o. [name] Primary Care Provider: Howard Pouch Other Clinician: Referring Provider: Treating Provider/Extender: Volney Presser Weeks in Treatment: 0 Debridement Performed for Assessment: Wound #1 Right,Lateral Ankle Performed By: Physician Fredirick Maudlin, MD Debridement Type: Debridement Severity of Tissue Pre Debridement: Fat layer exposed Level of Consciousness (Pre-procedure): Awake and Alert Pre-procedure Verification/Time Out Yes - 09:47 Taken: Start Time: 09:48 Pain Control: Lidocaine 5% topical ointment T Area Debrided (L x W): otal 1.5 (cm) x 1.9 (cm) = 2.85 (cm) Tissue and other material debrided: Viable, Non-Viable, Eschar, Slough, Subcutaneous, Slough Level: Skin/Subcutaneous Tissue Debridement Description: Excisional Instrument:  Curette Bleeding: Minimum Hemostasis Achieved: Pressure Response to Treatment: Procedure was tolerated well Level of Consciousness (Post- Awake and Alert procedure): Post Debridement Measurements of Total Wound Length: (cm) 1.5 Width: (cm) 1.9 Depth: (cm) 0.1 Volume: (cm) 0.224 Character of Wound/Ulcer Post Debridement: Requires Further Debridement Severity of Tissue Post Debridement: Fat layer exposed Post Procedure Diagnosis Same as Pre-procedure Notes Scribed for Dr. Celine Ahr by Blanche East, RN Electronic Signature(s) Signed: 11/11/2022 2:25:58 PM By: Fredirick Maudlin MD FACS Signed: 11/11/2022 4:25:11 PM By: Blanche East RN [name] ([id number]) [id number] Page 2 of 10 Entered By: Blanche East on 11/11/2022 09:52:34 -------------------------------------------------------------------------------- Debridement Details Patient Name: Date of Service: Mike [name] Major CK F. 11/11/2022 9:00 A M Medical Record Number: [id number] Patient Account Number: [id number] Date of Birth/Sex: Treating RN: 1946-06-13 (77 y.o. [name] Primary Care Provider: Howard Pouch Other Clinician: Referring Provider: Treating Provider/Extender: Volney Presser Weeks in Treatment: 0 Debridement Performed for Assessment: Wound #2 Left,Medial Ankle Performed By: Physician Fredirick Maudlin, MD Debridement Type: Debridement Severity of Tissue Pre Debridement: Fat layer exposed Level of Consciousness (Pre-procedure): Awake and Alert Pre-procedure Verification/Time Out Yes - 09:47 Taken: Start Time: 09:48 Pain Control: Lidocaine 5% topical ointment T Area Debrided (L x W): otal 0.5 (cm) x 0.5 (cm) = 0.25 (cm) Tissue and other material debrided: Viable, Non-Viable, Eschar, Slough, Subcutaneous, Slough Level: Skin/Subcutaneous Tissue Debridement Description: Excisional Instrument: Curette Bleeding: Minimum Hemostasis Achieved: Pressure Response to  Treatment: Procedure was tolerated well Level of Consciousness (Post- Awake and Alert procedure): Post Debridement Measurements of Total Wound Length: (cm) 0.5 Width: (cm) 0.5 Depth: (cm) 0.1 Volume: (cm) 0.02 Character of Wound/Ulcer Post Debridement: Requires Further Debridement Severity of Tissue Post Debridement: Fat layer exposed Post Procedure Diagnosis Same as Pre-procedure Notes Scribed for Dr. Celine Ahr by Blanche East, RN Electronic Signature(s) Signed: 11/11/2022 2:25:58 PM By: Fredirick Maudlin MD FACS Signed: 11/11/2022 4:25:11 PM By: Blanche East RN Entered By: Blanche East on 11/11/2022 09:52:45 -------------------------------------------------------------------------------- HPI Details Patient Name: Date  of Service: Mike [name], [name] 11/11/2022 9:00 A M Medical Record Number: [id number] Patient Account Number: [id number] Date of Birth/Sex: Treating RN: Sep 08, 1945 (77 y.o. M) Primary Care Provider: Howard Pouch Other Clinician: Referring Provider: Treating Provider/Extender: Reginia Naas, Renee Weeks in Treatment: 0 History of Present Illness HPI Description: ADMISSION 11/11/2022 This is a 77 year old man who suffered a fall in November 2023. He had a left hip fracture and left olecranon fracture. He had surgical treatment for both of these and was in rehabilitation for some time. As he became more active, he noted that his legs began to swell. He was seen by his primary care provider on February 16. She initiated Lasix 20 mg daily. He was also noted to have ulcers on both ankles. He was referred to the wound care center. While waiting for his appointment, he was seen by cardiology and had an updated echocardiogram performed. While he has severe left atrial enlargement, his ejection fraction is normal. He is not a diabetic. He has been applying bag balm to his ulcers. He does not wear compression stockings. On his right lateral ankle, there is a semilinear  ulcer with a typical stasis ulcer appearance. There is slough and eschar accumulation. On his left medial ankle, GERRED, ARNTZEN ([id number]) [id number] Page 3 of 10 just posterior to the malleolus, there is a small circular ulcer with slough and eschar present. No concern for infection at either site. Electronic Signature(s) Signed: 11/11/2022 10:25:01 AM By: Fredirick Maudlin MD FACS Previous Signature: 11/11/2022 9:05:22 AM Version By: Fredirick Maudlin MD FACS Entered By: Fredirick Maudlin on 11/11/2022 10:25:01 -------------------------------------------------------------------------------- Physical Exam Details Patient Name: Date of Service: Mike [name], [name] 11/11/2022 9:00 A M Medical Record Number: [id number] Patient Account Number: [id number] Date of Birth/Sex: Treating RN: 29-Sep-1945 (77 y.o. M) Primary Care Provider: Howard Pouch Other Clinician: Referring Provider: Treating Provider/Extender: Reginia Naas, Renee Weeks in Treatment: 0 Constitutional . Bradycardic, asymptomatic. . . No acute distress. Respiratory Normal work of breathing on room air. Notes 11/11/2022: On his right lateral ankle, there is a semilinear ulcer with a typical stasis ulcer appearance. There is slough and eschar accumulation. On his left medial ankle, just posterior to the malleolus, there is a small circular ulcer with slough and eschar present. No concern for infection at either site. Electronic Signature(s) Signed: 11/11/2022 10:25:42 AM By: Fredirick Maudlin MD FACS Entered By: Fredirick Maudlin on 11/11/2022 10:25:42 -------------------------------------------------------------------------------- Physician Orders Details Patient Name: Date of Service: Mike [name], [name] 11/11/2022 9:00 A M Medical Record Number: [id number] Patient Account Number: [id number] Date of Birth/Sex: Treating RN: [birth year] (77 y.o. 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 - Dr. Celine Ahr Rm 4 Return A Friday 11/25/22 at 10:30am Nurse Visit: - Thursday 11/17/22 at 10:00am Dressing change Anesthetic Wound #1 Right,Lateral Ankle (In clinic) Topical Lidocaine 5% applied to wound bed - prior to debridment Bathing/ Shower/ Hygiene May shower with protection but do not get wound dressing(s) wet. Protect dressing(s) with water repellant cover (for example, large plastic  bag) or a cast cover and may then take shower. - Please keep dressings clean and dry at all times Edema Control - Lymphedema / SCD / Other Elevate legs to the level of the heart or above for 30 minutes daily and/or when sitting for 3-4 times a day throughout the day. DEMONTRAE, BEFORT ([id number]) [id number] Page 4 of 10 Avoid standing for long periods of time. Moisturize legs daily. Wound Treatment Wound #1 - Ankle Wound Laterality: Right, Lateral Cleanser: Soap and Water 1 x Per Week Discharge Instructions: May shower and wash wound with dial antibacterial soap and water prior to dressing change. Peri-Wound Care: Sween Lotion (Moisturizing lotion) 1 x Per Week Discharge Instructions: Apply moisturizing lotion as directed Prim Dressing: Maxorb Extra Calcium Alginate, 2x2 (in/in) 1 x Per Week ary Discharge Instructions: Apply to wound bed as instructed Secondary Dressing: Zetuvit Plus 4x8 in 1 x Per Week Discharge Instructions: Apply over primary dressing as  directed. Compression Wrap: ThreePress (3 layer compression wrap) 1 x Per Week Discharge Instructions: Apply three layer compression as directed. Wound #2 - Ankle Wound Laterality: Left, Medial Cleanser: Soap and Water 1 x Per Week Discharge Instructions: May shower and wash wound with dial antibacterial soap and water prior to dressing change. Peri-Wound Care: Sween Lotion (Moisturizing lotion) 1 x Per Week Discharge Instructions: Apply moisturizing lotion as directed Prim Dressing: Maxorb Extra Calcium Alginate, 2x2 (in/in) 1 x Per Week ary Discharge Instructions: Apply to wound bed as instructed Secondary Dressing: Zetuvit Plus 4x8 in 1 x Per Week Discharge Instructions: Apply over primary dressing as directed. Compression Wrap: ThreePress (3 layer compression wrap) 1 x Per Week Discharge Instructions: Apply three layer compression as directed. Consults Vascular Electronic Signature(s) Signed: 11/11/2022 2:25:58 PM By: Fredirick Maudlin MD FACS Signed: 11/11/2022 4:25:11 PM By: Blanche East RN Entered By: Blanche East on 11/11/2022 10:55:47 -------------------------------------------------------------------------------- Problem List Details Patient Name: Date of Service: Mike [name] [name], [name] 11/11/2022 9:00 A M Medical Record Number: [id number] Patient Account Number: [id number] Date of Birth/Sex: Treating RN: 26-Aug-1946 (77 y.o. 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 11/11/2022 9:00 A M Medical Record Number: [id number] Patient Account Number: [id number] Date of Birth/Sex: Treating RN: 11-Aug-1946 (77 y.o. M) Primary Care Provider: Howard Pouch Other Clinician: Referring Provider: Treating Provider/Extender: Reginia Naas, Renee Weeks in Treatment: 0 Subjective Chief Complaint Information obtained from Patient Patient presents for treatment of open ulcers due to venous insufficiency History of Present Illness (HPI) ADMISSION 11/11/2022 This is a 77 year old man who suffered a fall in November 2023. He had a left hip fracture and left olecranon fracture. He had surgical  treatment for both of these and was in rehabilitation for some time. As he became more active, he noted that his legs began to swell. He was seen by his primary care provider on February 16. She initiated Lasix 20 mg daily. He was also noted to have ulcers on both ankles. He was referred to the wound care center. While waiting for his appointment, he was seen by cardiology and had an updated echocardiogram performed. While he has severe left atrial enlargement, his ejection fraction is normal. He is not a diabetic. He has been applying bag balm to his ulcers. He does not wear compression stockings. On his right lateral ankle, there is a semilinear ulcer with a typical stasis ulcer appearance.  There is slough and eschar accumulation. On his left medial ankle, just posterior to the malleolus, there is a small circular ulcer with slough and eschar present. No concern for infection at either site. Patient History Information obtained from Patient. Allergies levofloxacin, Neosporin (neo-bac-polym), adhesive tape Family History Diabetes - Mother, Heart Disease - Father,Siblings, Hypertension - Siblings, Lung Disease - Father, Stroke - Mother. Social History Former smoker - ended on 09/06/2007, Marital Status - Married, Alcohol Use - Rarely, Drug Use - No History, Caffeine Use - Daily. Medical History Eyes Patient has history of Cataracts - bila eyes Denies history of Glaucoma, Optic Neuritis Ear/Nose/Mouth/Throat Denies history of Chronic sinus problems/congestion, Middle ear problems Hematologic/Lymphatic Patient has history of Anemia Denies history of Hemophilia, Human Immunodeficiency Virus, Lymphedema, Sickle Cell Disease Respiratory Denies history of Aspiration, Asthma, Chronic Obstructive Pulmonary Disease (COPD), Pneumothorax, Sleep Apnea, Tuberculosis Cardiovascular Patient has history of Arrhythmia - AFIB, Hypertension Gastrointestinal Denies history of Cirrhosis , Colitis, Crohnoos, Hepatitis A, Hepatitis B, Hepatitis C Endocrine Denies history of Type I Diabetes, Type II Diabetes Genitourinary LIND, BARKMAN ([id number]) [id number] Page 6 of 10 Denies history of End Stage Renal Disease Immunological Denies history of Lupus Erythematosus, Raynaudoos Integumentary (Skin) Denies history of History of Burn Musculoskeletal Denies history of Gout, Rheumatoid Arthritis, Osteoarthritis, Osteomyelitis Oncologic Denies history of Received Chemotherapy, Received Radiation Psychiatric Denies history of Anorexia/bulimia, Confinement Anxiety Review of Systems (ROS) Constitutional Symptoms (Harper) Denies complaints or symptoms of  Fatigue, Fever, Chills, Marked Weight Change. Ear/Nose/Mouth/Throat Denies complaints or symptoms of Chronic sinus problems or rhinitis. Respiratory Denies complaints or symptoms of Chronic or frequent coughs, Shortness of Breath. Cardiovascular Denies complaints or symptoms of Chest pain. Gastrointestinal Denies complaints or symptoms of Frequent diarrhea, Nausea, Vomiting. Endocrine Denies complaints or symptoms of Heat/cold intolerance. Genitourinary Denies complaints or symptoms of Frequent urination. Integumentary (Skin) Denies complaints or symptoms of Wounds. Musculoskeletal Denies complaints or symptoms of Muscle Pain, Muscle Weakness. Psychiatric Denies complaints or symptoms of Claustrophobia. Objective Constitutional Bradycardic, asymptomatic. No acute distress. Vitals Time Taken: 8:51 AM, Height: 75 in, Source: Stated, Weight: 191 lbs, Source: Stated, BMI: 23.9, Temperature: 97.8 F, Pulse: 50 bpm, Respiratory Rate: 20 breaths/min, Blood Pressure: 127/76 mmHg. Respiratory Normal work of breathing on room air. General Notes: 11/11/2022: On his right lateral ankle, there is a semilinear ulcer with a typical stasis ulcer appearance. There is slough and eschar accumulation. On his left medial ankle, just posterior to the malleolus, there is a small circular ulcer with slough and eschar present. No concern for infection at either site. Integumentary (Hair, Skin) Wound #1 status is Open. Original cause of wound was Not Known. The date acquired was: 07/20/2022. The wound is located on the Right,Lateral Ankle. The wound measures 1.5cm length x 1.9cm  width x 0.1cm depth; 2.238cm^2 area and 0.224cm^3 volume. There is Fat Layer (Subcutaneous Tissue) exposed. There is no tunneling or undermining noted. There is a medium amount of serous drainage noted. There is medium (34-66%) granulation within the wound bed. There is a medium (34-66%) amount of necrotic tissue within the wound bed  including Adherent Slough. The periwound skin appearance had no abnormalities noted for texture. The periwound skin appearance had no abnormalities noted for color. The periwound skin appearance exhibited: Dry/Scaly. Periwound temperature was noted as No Abnormality. The periwound has tenderness on palpation. Wound #2 status is Open. Original cause of wound was Blister. The date acquired was: 10/30/2022. The wound is located on the Left,Medial Ankle. The wound measures 0.5cm length x 0.5cm width x 0.1cm depth; 0.196cm^2 area and 0.02cm^3 volume. There is Fat Layer (Subcutaneous Tissue) exposed. There is no tunneling or undermining noted. There is a medium amount of serosanguineous drainage noted. There is medium (34-66%) pink granulation within the wound bed. There is a medium (34-66%) amount of necrotic tissue within the wound bed including Eschar and Adherent Slough. The periwound skin appearance had no abnormalities noted for texture. The periwound skin appearance had no abnormalities noted for color. 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 There was a Excisional Skin/Subcutaneous Tissue Debridement with a total area of 2.85 sq cm performed by Fredirick Maudlin, MD. With the following instrument(s): Curette to remove Viable and Non-Viable tissue/material. Material removed includes Eschar, Subcutaneous Tissue, and Slough after achieving pain control using Lidocaine 5% topical ointment. No specimens were taken. A  time out was conducted at 09:47, prior to the start of the procedure. A Minimum amount of bleeding was controlled with Pressure. The procedure was tolerated well. Post Debridement Measurements: 1.5cm length x 1.9cm width x 0.1cm depth; 0.224cm^3 volume. Character of Wound/Ulcer Post Debridement requires further debridement. Severity of Tissue Post Debridement is: Fat layer exposed. Post procedure Diagnosis Wound #1: Same as Pre-Procedure General Notes: Scribed for Dr. Celine Ahr by Blanche East, RN. Pre-procedure diagnosis of Wound #1 is a Venous Leg Ulcer located on the Right,Lateral Ankle . There was a Three Layer Compression Therapy Procedure by Blanche East, RN. Post procedure Diagnosis Wound #1: Same as Pre-Procedure Wound #2 Pre-procedure diagnosis of Wound #2 is a Venous Leg Ulcer located on the Left,Medial Ankle .Severity of Tissue Pre Debridement is: Fat layer exposed. There was a Excisional Skin/Subcutaneous Tissue Debridement with a total area of 0.25 sq cm performed by Fredirick Maudlin, MD. With the following instrument(s): Curette to remove Viable and Non-Viable tissue/material. Material removed includes Eschar, Subcutaneous Tissue, and Slough after achieving pain control using Lidocaine 5% topical ointment. No specimens were taken. A time out was conducted at 09:47, prior to the start of the procedure. A Minimum amount of bleeding was controlled with Pressure. The procedure was tolerated well. Post Debridement Measurements: 0.5cm length x 0.5cm width x 0.1cm depth; 0.02cm^3 volume. Character of Wound/Ulcer Post Debridement requires further debridement. Severity of Tissue Post Debridement is: Fat layer exposed. Post procedure Diagnosis Wound #2: Same as Pre-Procedure General Notes: Scribed for Dr. Celine Ahr by Roselyn Reef  Zochol, Therapist, sports. Pre-procedure diagnosis of Wound #2 is a Venous Leg Ulcer located on the Left,Medial Ankle . There was a Three Layer Compression Therapy Procedure by Blanche East,  RN. Post procedure Diagnosis Wound #2: Same as Pre-Procedure Plan Follow-up Appointments: Return Appointment in 2 weeks. - Dr. Celine Ahr Rm 4 Friday 11/25/22 at 10:30am Nurse Visit: - Thursday 11/17/22 at 10:00am Dressing change Anesthetic: Wound #1 Right,Lateral Ankle: (In clinic) Topical Lidocaine 5% applied to wound bed - prior to debridment Bathing/ Shower/ Hygiene: May shower with protection but do not get wound dressing(s) wet. Protect dressing(s) with water repellant cover (for example, large plastic bag) or a cast cover and may then take shower. - Please keep dressings clean and dry at all times Edema Control - Lymphedema / SCD / Other: Elevate legs to the level of the heart or above for 30 minutes daily and/or when sitting for 3-4 times a day throughout the day. Avoid standing for long periods of time. Moisturize legs daily. Consults ordered were: Vascular WOUND #1: - Ankle Wound Laterality: Right, Lateral Cleanser: Soap and Water 1 x Per Week/ Discharge Instructions: May shower and wash wound with dial antibacterial soap and water prior to dressing change. Peri-Wound Care: Sween Lotion (Moisturizing lotion) 1 x Per Week/ Discharge Instructions: Apply moisturizing lotion as directed Prim Dressing: Maxorb Extra Calcium Alginate, 2x2 (in/in) 1 x Per Week/ ary Discharge Instructions: Apply to wound bed as instructed Secondary Dressing: Zetuvit Plus 4x8 in 1 x Per Week/ Discharge Instructions: Apply over primary dressing as directed. Com pression Wrap: ThreePress (3 layer compression wrap) 1 x Per Week/ Discharge Instructions: Apply three layer compression as directed. WOUND #2: - Ankle Wound Laterality: Left, Medial Cleanser: Soap and Water 1 x Per Week/ Discharge Instructions: May shower and wash wound with dial antibacterial soap and water prior to dressing change. Peri-Wound Care: Sween Lotion (Moisturizing lotion) 1 x Per Week/ Discharge Instructions: Apply moisturizing lotion  as directed Prim Dressing: Maxorb Extra Calcium Alginate, 2x2 (in/in) 1 x Per Week/ ary Discharge Instructions: Apply to wound bed as instructed Secondary Dressing: Zetuvit Plus 4x8 in 1 x Per Week/ Discharge Instructions: Apply over primary dressing as directed. Com pression Wrap: ThreePress (3 layer compression wrap) 1 x Per Week/ Discharge Instructions: Apply three layer compression as directed. This is a 77 year old man who developed lower extremity edema after recovering from a hip fracture. The edema led to ulceration. On his right lateral ankle, there is a semilinear ulcer with a typical stasis ulcer appearance. There is slough and eschar accumulation. On his left medial ankle, just posterior to the malleolus, there is a small circular ulcer with slough and eschar present. No concern for infection at either site. I used a curette to debride slough, eschar, and subcutaneous tissue from both of his wounds. We will apply silver alginate and 3 layer compression bilaterally. 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 Due to limited provider availability, he will have a nurse visit next week for dressing change and follow-up with me in 2 weeks. TRAPPER, GOING ([id number]) [id number] Page 8 of 10 Electronic Signature(s) Signed: 11/11/2022 2:25:58 PM By: Fredirick Maudlin MD FACS Signed: 11/11/2022 3:03:33 PM By: Deon Pilling RN, BSN Previous Signature: 11/11/2022 10:27:39 AM Version By: Fredirick Maudlin MD FACS Entered By: Deon Pilling on 11/11/2022 13:40:46 -------------------------------------------------------------------------------- HxROS Details Patient Name: Date of Service: Mike [name], [name] 11/11/2022 9:00 A M Medical Record Number: [id number]  Patient Account Number: [id number] Date of Birth/Sex: Treating RN: 1946-08-22 (77 y.o. [name] Primary Care Provider: Howard Pouch Other  Clinician: Referring Provider: Treating Provider/Extender: Volney Presser Weeks in Treatment: 0 Information Obtained From Patient Constitutional Symptoms (General Health) Complaints and Symptoms: Negative for: Fatigue; Fever; Chills; Marked Weight Change Ear/Nose/Mouth/Throat Complaints and Symptoms: Negative for: Chronic sinus problems or rhinitis Medical History: Negative for: Chronic sinus problems/congestion; Middle ear problems Respiratory Complaints and Symptoms: Negative for: Chronic or frequent coughs; Shortness of Breath Medical History: Negative for: Aspiration; Asthma; Chronic Obstructive Pulmonary Disease (COPD); Pneumothorax; Sleep Apnea; Tuberculosis Cardiovascular Complaints and Symptoms: Negative for: Chest pain Medical History: Positive for: Arrhythmia - AFIB; Hypertension Gastrointestinal Complaints and Symptoms: Negative for: Frequent diarrhea; Nausea; Vomiting Medical History: Negative for: Cirrhosis ; Colitis; Crohns; Hepatitis A; Hepatitis B; Hepatitis C Endocrine Complaints and Symptoms: Negative for: Heat/cold intolerance Medical History: Negative for: Type I Diabetes; Type II Diabetes Genitourinary Complaints and Symptoms: Negative for: Frequent urination Medical History: Negative for: End Stage Renal Disease Integumentary (Skin) Complaints and Symptoms: Negative for: Wounds STANLY, MCNELIS ([id number]) [id number] Page 9 of 10 Medical History: Negative for: History of Burn Musculoskeletal Complaints and Symptoms: Negative for: Muscle Pain; Muscle Weakness Medical History: Negative for: Gout; Rheumatoid Arthritis; Osteoarthritis; Osteomyelitis Psychiatric Complaints and Symptoms: Negative for: Claustrophobia Medical History: Negative for: Anorexia/bulimia; Confinement Anxiety Eyes Medical History: Positive for: Cataracts - bila eyes Negative for: Glaucoma; Optic  Neuritis Hematologic/Lymphatic Medical History: Positive for: Anemia Negative for: Hemophilia; Human Immunodeficiency Virus; Lymphedema; Sickle Cell Disease Immunological Medical History: Negative for: Lupus Erythematosus; Raynauds Oncologic Medical History: Negative for: Received Chemotherapy; Received Radiation HBO Extended History Items Eyes: Cataracts Immunizations Pneumococcal Vaccine: Received Pneumococcal Vaccination: Yes Received Pneumococcal Vaccination On or After 60th Birthday: Yes Implantable Devices No devices added Family and Social History Diabetes: Yes - Mother; Heart Disease: Yes - Father,Siblings; Hypertension: Yes - Siblings; Lung Disease: Yes - Father; Stroke: Yes - Mother; Former smoker - ended on 09/06/2007; Marital Status - Married; Alcohol Use: Rarely; Drug Use: No History; Caffeine Use: Daily; Financial Concerns: No; Food, Clothing or Shelter Needs: No; Support System Lacking: No; Transportation Concerns: No Physician Affirmation I have reviewed and agree with the above information. Electronic Signature(s) Signed: 11/11/2022 9:37:54 AM By: Fredirick Maudlin MD FACS Signed: 11/11/2022 4:25:11 PM By: Blanche East RN Entered By: Blanche East on 11/11/2022 09:16:13 -------------------------------------------------------------------------------- SuperBill Details Patient Name: Date of Service: Mike [name] [name], [name] 11/11/2022 Medical Record Number: [id number] Patient Account Number: [id number] Date of Birth/Sex: Treating RN: 1946/03/13 (77 y.o. 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 R60.0 Localized edema I48.21 Permanent atrial fibrillation I83.003 Varicose veins of unspecified lower extremity with ulcer of ankle Facility Procedures : CPT4 Code: [id number] Description: 99214 - WOUND CARE VISIT-LEV 4 EST PT Modifier: 25 Quantity: 1 : CPT4 Code: [id number] Description: 11042 - DEB SUBQ TISSUE 20 SQ CM/< ICD-10 Diagnosis Description 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 Modifier: Quantity: 1 Physician Procedures : CPT4 Code Description Modifier [id number] - WC PHYS LEVEL 4 - NEW PT 25 ICD-10 Diagnosis Description L97.312 Non-pressure chronic  ulcer of right ankle with fat layer exposed L97.322 Non-pressure chronic ulcer of left ankle with fat layer exposed  R60.0 Localized edema I83.003 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

## 2022-11-13 DIAGNOSIS — I517 Cardiomegaly: Secondary | ICD-10-CM

## 2022-11-13 DIAGNOSIS — Z79899 Other long term (current) drug therapy: Secondary | ICD-10-CM

## 2022-11-13 NOTE — Progress Notes (Signed)
TABB, NORIEGA ([id number]) [phone] [id number] Page 1 of 4 Visit Report for 11/11/2022 Abuse Risk Screen Details Patient Name: Date of Service: [name]. 11/11/2022 9:00 A M Medical Record Number: [id number] Patient Account Number: [id number] Date of Birth/Sex: Treating RN: 22-Feb-1946 (77 y.o. [name] Primary Care Jerzy Roepke: Howard Pouch Other Clinician: Referring Cuinn Westerhold: Treating Anaston Koehn/Extender: Reginia Naas, Renee Weeks in Treatment: 0 Abuse Risk Screen Items Answer ABUSE RISK SCREEN: Has anyone close to you tried to hurt or harm you recentlyo No Do you feel uncomfortable with anyone in your familyo No Has anyone forced you do things that you didnt want to doo No Electronic Signature(s) Signed: 11/11/2022 4:25:11 PM By: Blanche East RN Entered By: Blanche East on 11/11/2022 09:16:44 -------------------------------------------------------------------------------- Activities of Daily Living Details Patient Name: Date of Service: [name]. 11/11/2022 9:00 A M Medical Record Number: [id number] Patient Account Number: [id number] Date of Birth/Sex: Treating RN: October 04, 1945 (77 y.o. [name] Primary Care Naziya Hegwood: Howard Pouch Other Clinician: Referring Danahi Reddish: Treating Vivek Grealish/Extender: Reginia Naas, Renee Weeks in Treatment: 0 Activities of Daily Living Items Answer Activities of Daily Living (Please select one for each item) Drive Automobile Completely Able T Medications ake Completely Able Use T elephone Completely Able Care for Appearance Completely Able Use T oilet Completely Able Bath / Shower Need Assistance Dress Self Completely Able Feed Self Completely Able Walk Need Assistance Get In / Out Bed Completely [place] for Self Need Assistance Electronic Signature(s) Signed: 11/11/2022 4:25:11 PM  By: Blanche East RN Entered By: Blanche East on 11/11/2022 09:17:42 -------------------------------------------------------------------------------- Education Screening Details Patient Name: Date of Service: [name], [name] 11/11/2022 9:00 A M Medical Record Number: [id number] Patient Account Number: [id number] Date of Birth/Sex: Treating RN: Nov 17, 1945 (77 y.o. [name] Primary Care Bethan Adamek: Howard Pouch Other Clinician: Referring Pualani Borah: Treating Armen Waring/Extender: Reginia Naas, Renee Weeks in Treatment: [address] DONELL, JALOMO ([id number]) [phone].pdf Page 2 of 4 Primary Learner Assessed: Patient Learning Preferences/Education Level/Primary Language Learning Preference: Explanation Highest Education Level: College or Above Preferred Language: English Cognitive Barrier Language Barrier: No Translator Needed: No Memory Deficit: No Emotional Barrier: No Cultural/Religious Beliefs Affecting Medical Care: No Physical Barrier Impaired Vision: Yes Glasses, readers Impaired Hearing: No Decreased Hand dexterity: No Knowledge/Comprehension Knowledge Level: High Comprehension Level: High Ability to understand written instructions: High Ability to understand verbal instructions: High Motivation Anxiety Level: Calm Cooperation: Cooperative Education Importance: Acknowledges Need Interest in Health Problems: Asks Questions Perception: Coherent Willingness to Engage in Self-Management High Activities: Readiness to Engage in Self-Management High Activities: Electronic Signature(s) Signed: 11/11/2022 4:25:11 PM By: Blanche East RN Entered By: Blanche East on 11/11/2022 09:18:25 -------------------------------------------------------------------------------- Fall Risk Assessment Details Patient Name: Date of Service: [name], [name] 11/11/2022 9:00 A M Medical Record Number: [id number] Patient Account Number: [id number] Date  of Birth/Sex: Treating RN: 11/23/1945 (77 y.o. [name] Primary Care Romell Cavanah: Howard Pouch Other Clinician: Referring Helix Lafontaine: Treating Jasen Hartstein/Extender: Volney Presser Weeks in Treatment: 0 Fall Risk Assessment Items Have you had 2 or more falls in the last 12 monthso 0 Yes Have you had any fall that resulted in injury in the last 12 monthso 0 Yes FALLS RISK SCREEN History of falling - immediate or within 3 months 0 No Secondary diagnosis (Do you have 2 or more medical diagnoseso) 0 No  Ambulatory aid None/bed rest/wheelchair/nurse 0 No Crutches/cane/walker 15 Yes Furniture 0 No Intravenous therapy Access/Saline/Heparin Lock 0 No Gait/Transferring Normal/ bed rest/ wheelchair 0 Yes Weak (short steps with or without shuffle, stooped but able to lift head while walking, may seek 0 No support from furniture) Impaired (short steps with shuffle, may have difficulty arising from chair, head down, impaired 0 No balance) Mental Status Oriented to own ability 0 Yes Overestimates or forgets limitations 0 No Risk Level: Low Risk Score: [address] KHAMDEN, MINISH F ([id number]) [id number] Page 3 of 4 Electronic Signature(s) -------------------------------------------------------------------------------- Foot Assessment Details Patient Name: Date of Service: [name]. 11/11/2022 9:00 A M Medical Record Number: [id number] Patient Account Number: [id number] Date of Birth/Sex: Treating RN: 05/09/1946 (77 y.o. [name] Primary Care Biddie Sebek: Howard Pouch Other Clinician: Referring Dakota Vanwart: Treating Stellan Vick/Extender: Reginia Naas, Renee Weeks in Treatment: 0 Foot Assessment Items Site Locations + = Sensation present, - = Sensation absent, C = Callus, U = Ulcer R = Redness, W = Warmth, M = Maceration, PU = Pre-ulcerative lesion F = Fissure, S = Swelling, D = Dryness Assessment Right: Left: Other Deformity: No  No Prior Foot Ulcer: No No Prior Amputation: No No Charcot Joint: No No Ambulatory Status: Ambulatory With Help Assistance Device: Cane Gait: Steady Electronic Signature(s) Signed: 11/11/2022 4:25:11 PM By: Blanche East RN Entered By: Blanche East on 11/11/2022 09:21:47 -------------------------------------------------------------------------------- Nutrition Risk Screening Details Patient Name: Date of Service: [name], [name] 11/11/2022 9:00 A M Medical Record Number: [id number] Patient Account Number: [id number] Date of Birth/Sex: Treating RN: 1946-02-25 (77 y.o. [name] Primary Care Marianny Goris: Howard Pouch Other Clinician: Referring Laiklynn Raczynski: Treating Dymon Summerhill/Extender: Reginia Naas, Renee Weeks in Treatment: 0 Height (in): 75 Weight (lbs): 191 Body Mass Index (BMI): 23.9 RUBERT, RISEN ([id number]) [id number] Page 4 of 4 Nutrition Risk Screening Items Score Screening NUTRITION RISK SCREEN: I have an illness or condition that made me change the kind and/or amount of food I eat 0 No I eat fewer than two meals per day 0 No I eat few fruits and vegetables, or milk products 0 No I have three or more drinks of beer, liquor or wine almost every day 0 No I have tooth or mouth problems that make it hard for me to eat 0 No I don't always have enough money to buy the food I need 0 No I eat alone most of the time 0 No I take three or more different prescribed or over-the-counter drugs a day 0 No Without wanting to, I have lost or gained 10 pounds in the last six months 2 Yes I am not always physically able to shop, cook and/or feed myself 0 No Nutrition Protocols Good Risk Protocol 0 No interventions needed Moderate Risk Protocol High Risk Proctocol Risk Level: Good Risk Score: 2 Electronic Signature(s) Signed: 11/11/2022 4:25:11 PM By: Blanche East RN Entered By: Blanche East on 11/11/2022 09:19:36

## 2022-11-13 NOTE — Progress Notes (Signed)
SIDI, COTTRILL ([id number]) [id number] Page 1 of 10 Visit Report for 11/11/2022 Allergy List Details Patient Name: Date of Service: [name]. 11/11/2022 9:00 A M Medical Record Number: [id number] Patient Account Number: [id number] Date of Birth/Sex: Treating RN: July 12, 1946 (77 y.o. [name] Primary Care Claudis Giovanelli: Howard Pouch Other Clinician: Referring Masud Holub: Treating Denis Koppel/Extender: Reginia Naas, Renee Weeks in Treatment: 0 Allergies Active Allergies levofloxacin Neosporin (neo-bac-polym) adhesive tape Allergy Notes Electronic Signature(s) Signed: 11/11/2022 4:25:11 PM By: Blanche East RN Previous Signature: 11/11/2022 9:04:41 AM Version By: Blanche East RN Entered By: Blanche East on 11/11/2022 09:09:27 -------------------------------------------------------------------------------- Arrival Information Details Patient Name: Date of Service: [name], [name] 11/11/2022 9:00 A M Medical Record Number: [id number] Patient Account Number: [id number] Date of Birth/Sex: Treating RN: 12-13-1945 (77 y.o. M) Primary Care Karlyn Glasco: Howard Pouch Other Clinician: Referring Deuntae Kocsis: Treating Shakeia Krus/Extender: Dorris Carnes in Treatment: 0 Visit Information Patient Arrived: Ambulatory Arrival Time: 08:50 Accompanied By: wife Transfer Assistance: None Patient Identification Verified: Yes Secondary Verification Process Completed: Yes Patient Has Alerts: Yes Patient Alerts: Patient on Blood Thinner Electronic Signature(s) Signed: 11/11/2022 4:25:11 PM By: Blanche East RN Entered By: Blanche East on 11/11/2022 09:09:12 -------------------------------------------------------------------------------- Clinic Level of Care Assessment Details Patient Name: Date of Service: [name], [name] 11/11/2022 9:00 A M Medical Record Number: [id number] Patient Account Number: [id number] Date of Birth/Sex: Treating  RN: 04/17/1946 (77 y.o. [name] Primary Care Neyah Ellerman: Howard Pouch Other Clinician: Referring Matie Dimaano: Treating Adaja Wander/Extender: Volney Presser Weeks in Treatment: 0 Clinic Level of Care Assessment Items TOOL 1 Quantity Score X- 1 0 Use when EandM and Procedure is performed on INITIAL visit OLGA, NORSWORTHY ([id number]) [id number] Page 2 of 10 ASSESSMENTS - Nursing Assessment / Reassessment X- 1 20 General Physical Exam (combine w/ comprehensive assessment (listed just below) when performed on new pt. evals) X- 1 25 Comprehensive Assessment (HX, ROS, Risk Assessments, Wounds Hx, etc.) ASSESSMENTS - Wound and Skin Assessment / Reassessment '[]'$  - 0 Dermatologic / Skin Assessment (not related to wound area) ASSESSMENTS - Ostomy and/or Continence Assessment and Care '[]'$  - 0 Incontinence Assessment and Management '[]'$  - 0 Ostomy Care Assessment and Management (repouching, etc.) PROCESS - Coordination of Care X - Simple Patient / Family Education for ongoing care 1 15 '[]'$  - 0 Complex (extensive) Patient / Family Education for ongoing care X- 1 10 Staff obtains Programmer, systems, Records, T Results / Process Orders est X- 1 10 Staff telephones HHA, Nursing Homes / Clarify orders / etc X- 1 10 Routine Transfer to another Facility (non-emergent condition) '[]'$  - 0 Routine Hospital Admission (non-emergent condition) X- 1 15 New Admissions / Biomedical engineer / Ordering NPWT Apligraf, etc. , '[]'$  - 0 Emergency Hospital Admission (emergent condition) PROCESS - Special Needs '[]'$  - 0 Pediatric / Minor Patient Management '[]'$  - 0 Isolation Patient Management '[]'$  - 0 Hearing / Language / Visual special needs '[]'$  - 0 Assessment of Community assistance (transportation, D/C planning, etc.) '[]'$  - 0 Additional assistance / Altered mentation '[]'$  - 0 Support Surface(s) Assessment (bed, cushion, seat, etc.) INTERVENTIONS - Miscellaneous '[]'$  -  0 External ear exam '[]'$  - 0 Patient Transfer (multiple staff / Civil Service fast streamer / Similar devices) '[]'$  - 0 Simple Staple / Suture removal (25 or less) '[]'$  - 0 Complex Staple / Suture removal (26 or more) '[]'$  - 0 Hypo/Hyperglycemic Management (do not check if billed separately) X- 1 15  Ankle / Brachial Index (ABI) - do not check if billed separately Has the patient been seen at the hospital within the last three years: Yes Total Score: 120 Level Of Care: New/Established - Level 4 Electronic Signature(s) Signed: 11/11/2022 4:25:11 PM By: Blanche East RN Entered By: Blanche East on 11/11/2022 11:05:14 -------------------------------------------------------------------------------- Compression Therapy Details Patient Name: Date of Service: [name], [name] 11/11/2022 9:00 A M Medical Record Number: [id number] Patient Account Number: [id number] Date of Birth/Sex: Treating RN: 02-Aug-1946 (77 y.o. [name] Primary Care Dorise Gangi: Howard Pouch Other Clinician: Referring Jaeson Molstad: Treating Keyatta Tolles/Extender: Reginia Naas, Renee Weeks in Treatment: 0 Compression Therapy Performed for Wound Assessment: Wound #1 Right,Lateral Ankle Performed By: Clinician Blanche East, RN Compression Type: Three Layer Post Procedure Diagnosis Same as Pre-procedure ELEFTERIOS, FLETES ([id number]) [id number] Page 3 of 10 Electronic Signature(s) Signed: 11/11/2022 10:48:14 AM By: Blanche East RN Entered By: Blanche East on 11/11/2022 10:48:14 -------------------------------------------------------------------------------- Compression Therapy Details Patient Name: Date of Service: [name], [name] 11/11/2022 9:00 A M Medical Record Number: [id number] Patient Account Number: [id number] Date of Birth/Sex: Treating RN: 04-Oct-1945 (77 y.o. [name] Primary Care Sirenity Shew: Howard Pouch Other Clinician: Referring Tab Rylee: Treating Keiondre Colee/Extender: Reginia Naas, Renee Weeks in Treatment: 0 Compression Therapy Performed for Wound Assessment: Wound #2 Left,Medial Ankle Performed By: Clinician Blanche East, RN Compression Type: Three Layer Post Procedure Diagnosis Same as Pre-procedure Electronic Signature(s) Signed: 11/11/2022 10:48:14 AM By: Blanche East RN Entered By: Blanche East on 11/11/2022 10:48:14 -------------------------------------------------------------------------------- Encounter Discharge Information Details Patient Name: Date of Service: [name], [name] 11/11/2022 9:00 A M Medical Record Number: [id number] Patient Account Number: [id number] Date of Birth/Sex: Treating RN: 1946/09/01 (77 y.o. [name] Primary Care Adriana Lina: Howard Pouch Other Clinician: Referring Lakeidra Reliford: Treating Jhaniya Briski/Extender: Volney Presser Weeks in Treatment: 0 Encounter Discharge Information Items Post Procedure Vitals Discharge Condition: Stable Temperature (F): 97.8 Ambulatory Status: Ambulatory Pulse (bpm): 50 Discharge Destination: Home Respiratory Rate (breaths/min): 20 Transportation: Private Auto Blood Pressure (mmHg): 127/76 Accompanied By: wife Schedule Follow-up Appointment: Yes Clinical Summary of Care: Electronic Signature(s) Signed: 11/11/2022 11:06:39 AM By: Blanche East RN Entered By: Blanche East on 11/11/2022 11:06:38 -------------------------------------------------------------------------------- Lower Extremity Assessment Details Patient Name: Date of Service: [name], [name] 11/11/2022 9:00 A M Medical Record Number: [id number] Patient Account Number: [id number] Date of Birth/Sex: Treating RN: August 04, 1946 (77 y.o. [name] Primary Care Shaianne Nucci: Howard Pouch Other Clinician: Referring Jamita Mckelvin: Treating Bianna Haran/Extender: Reginia Naas, Renee Weeks in Treatment: 0 Edema Assessment Assessed: [Left: No] Patrice Paradise: No] [Left: Edema] [Right: Earl Lites ARL, VANDORN F ([id number]) [id number] Page 4 of 10 Left: Right: Point of Measurement: From Medial Instep 35 cm 34.5 cm Ankle Left: Right: Point of Measurement: From Medial Instep 23 cm 24 cm Vascular Assessment Pulses: Dorsalis Pedis Palpable: [Left:Yes] [Right:Yes] Doppler Audible: [Left:Inaudible] [Right:Yes] Posterior Tibial Doppler Audible: [Left:Yes] [Right:Yes] Blood Pressure: Brachial: [Left:127] [Right:127] Ankle: [Left:Posterior Tibial: 130 1.02] [Right:Dorsalis Pedis: 160 1.26] Electronic Signature(s) Signed: 11/11/2022 10:55:10 AM By: Blanche East RN Entered By: Blanche East on 11/11/2022 10:55:10 -------------------------------------------------------------------------------- Multi Wound Chart Details Patient Name: Date of Service: [name], [name] 11/11/2022 9:00 A M Medical Record Number: [id number] Patient Account Number: [id number] Date of Birth/Sex: Treating RN: 05-07-1946 (77 y.o. M) Primary Care Gypsy Kellogg: Howard Pouch Other Clinician: Referring Damarius Karnes: Treating Dimetri Armitage/Extender: Reginia Naas, Renee Weeks in Treatment: 0 Vital Signs Height(in):  75 Pulse(bpm): 50 Weight(lbs): 191 Blood Pressure(mmHg): 127/76 Body Mass Index(BMI): 23.9 Temperature(F): 97.8 Respiratory Rate(breaths/min): 20 [1:Photos:] [N/A:N/A] Right, Lateral Ankle Left, Medial Ankle N/A Wound Location: Not Known Blister N/A Wounding Event: T be determined o Venous Leg Ulcer N/A Primary Etiology: Cataracts, Anemia, Arrhythmia, Cataracts, Anemia, Arrhythmia, N/A Comorbid History: Hypertension Hypertension 07/20/2022 10/30/2022 N/A Date Acquired: 0 0 N/A Weeks of Treatment: Open Open N/A Wound Status: No No N/A Wound Recurrence: 1.5x1.9x0.1 0.5x0.5x0.1 N/A Measurements L x W x D (cm) 2.238 0.196 N/A A (cm) : rea 0.224 0.02 N/A Volume (cm) : Full Thickness Without Exposed Full Thickness Without Exposed N/A Classification: Support  Structures Support Structures Medium Medium N/A Exudate A mount: Serous Serosanguineous N/A Exudate Type: amber red, brown N/A Exudate Color: Medium (34-66%) Medium (34-66%) N/A Granulation Amount: N/A Pink N/A Granulation Quality: Medium (34-66%) Medium (34-66%) N/A Necrotic Amount: Adherent [place], [place] N/A Necrotic Tissue: DAYLEN, BRODERICK ([id number]) [id number] Page 5 of 10 Fat Layer (Subcutaneous Tissue): Yes Fat Layer (Subcutaneous Tissue): Yes N/A Exposed Structures: Fascia: No Fascia: No Tendon: No Tendon: No Muscle: No Muscle: No Joint: No Joint: No Bone: No Bone: No N/A Small (1-33%) N/A Epithelialization: Debridement - Excisional Debridement - Excisional N/A Debridement: Pre-procedure Verification/Time Out 09:47 09:47 N/A Taken: Lidocaine 5% topical ointment Lidocaine 5% topical ointment N/A Pain Control: Necrotic/Eschar, Subcutaneous, Necrotic/Eschar, Subcutaneous, N/A Tissue Debrided: [employer] Skin/Subcutaneous Tissue Skin/Subcutaneous Tissue N/A Level: 2.85 0.25 N/A Debridement A (sq cm): rea Curette Curette N/A Instrument: Minimum Minimum N/A Bleeding: Pressure Pressure N/A Hemostasis Achieved: Debridement Treatment Response: Procedure was tolerated well Procedure was tolerated well N/A Post Debridement Measurements L x 1.5x1.9x0.1 0.5x0.5x0.1 N/A W x D (cm) 0.224 0.02 N/A Post Debridement Volume: (cm) No Abnormalities Noted No Abnormalities Noted N/A Periwound Skin Texture: Dry/Scaly: Yes N/A Periwound Skin Moisture: Ecchymosis: Yes No Abnormalities Noted N/A Periwound Skin Color: Rubor: Yes No Abnormality N/A N/A Temperature: Yes N/A N/A Tenderness on Palpation: Debridement Debridement N/A Procedures Performed: Treatment Notes Electronic Signature(s) Signed: 11/11/2022 10:23:16 AM By: Fredirick Maudlin MD FACS Previous Signature: 11/11/2022 8:59:11 AM Version By: Fredirick Maudlin MD  FACS Entered By: Fredirick Maudlin on 11/11/2022 10:23:16 -------------------------------------------------------------------------------- Multi-Disciplinary Care Plan Details Patient Name: Date of Service: [name], [name] 11/11/2022 9:00 A M Medical Record Number: [id number] Patient Account Number: [id number] Date of Birth/Sex: Treating RN: 1946/01/29 (77 y.o. [name] Primary Care Anetta Olvera: Howard Pouch Other Clinician: Referring Marri Mcneff: Treating Ark Agrusa/Extender: Reginia Naas, Renee Weeks in Treatment: 0 Active Inactive Nutrition Nursing Diagnoses: Potential for alteratiion in Nutrition/Potential for imbalanced nutrition Goals: Patient/caregiver agrees to and verbalizes understanding of need to use nutritional supplements and/or vitamins as prescribed Date Initiated: 11/11/2022 Target Resolution Date: 01/03/2023 Goal Status: Active Interventions: Assess patient nutrition upon admission and as needed per policy Treatment Activities: Dietary management education, guidance and counseling : 11/11/2022 Notes: Orientation to the Wound Care Program Nursing Diagnoses: Knowledge deficit related to the wound healing center program Goals: Patient/caregiver will verbalize understanding of the [place] Date Initiated: 11/11/2022 Target Resolution Date: 12/06/2022 JAYZEN, AUSTERMAN ([id number]) [id number] Page 6 of 10 Goal Status: Active Interventions: Provide education on orientation to the wound center Notes: Venous Leg Ulcer Nursing Diagnoses: Potential for venous Insuffiency (use before diagnosis confirmed) Goals: Patient will maintain optimal edema control Date Initiated: 11/11/2022 Target Resolution Date: 01/03/2023 Goal Status: Active Interventions: Compression as ordered Provide education on venous insufficiency Treatment Activities: Therapeutic compression applied : 11/11/2022 Notes: Wound/Skin Impairment  Nursing  Diagnoses: Impaired tissue integrity Goals: Ulcer/skin breakdown will have a volume reduction of 30% by week 4 Date Initiated: 11/11/2022 Target Resolution Date: 01/03/2023 Goal Status: Active Interventions: Assess ulceration(s) every visit Provide education on smoking Treatment Activities: Skin care regimen initiated : 11/11/2022 Notes: Electronic Signature(s) Signed: 11/11/2022 4:25:11 PM By: Blanche East RN Entered By: Blanche East on 11/11/2022 09:56:30 -------------------------------------------------------------------------------- Pain Assessment Details Patient Name: Date of Service: [name], [name] 11/11/2022 9:00 A M Medical Record Number: [id number] Patient Account Number: [id number] Date of Birth/Sex: Treating RN: 06-Jul-1946 (77 y.o. M) Primary Care Armand Preast: Howard Pouch Other Clinician: Referring Nyriah Coote: Treating Michaelah Credeur/Extender: Reginia Naas, Renee Weeks in Treatment: 0 Active Problems Location of Pain Severity and Description of Pain Patient Has Paino Yes Site Locations Rate the pain. CURREN, DELON ([id number]) [id number] Page 7 of 10 Rate the pain. Current Pain Level: 4 Worst Pain Level: 10 Least Pain Level: 0 Tolerable Pain Level: 2 Pain Management and Medication Current Pain Management: Electronic Signature(s) Signed: 11/11/2022 11:24:12 AM By: Worthy Rancher Entered By: Worthy Rancher on 11/11/2022 08:52:12 -------------------------------------------------------------------------------- Patient/Caregiver Education Details Patient Name: Date of Service: [name], [name] 3/8/2024andnbsp9:00 A M Medical Record Number: [id number] Patient Account Number: [id number] Date of Birth/Gender: Treating RN: 1946/06/26 (77 y.o. [name] Primary Care Physician: Howard Pouch Other Clinician: Referring Physician: Treating Physician/Extender: Dorris Carnes in Treatment: 0 Education  Assessment Education Provided To: Patient Education Topics Provided Welcome T [facility]-New Patient Packet: o Handouts: [place] o Methods: Explain/Verbal Responses: Reinforcements needed, State content correctly Wound Debridement: Handouts: Wound Debridement Methods: Explain/Verbal Responses: Reinforcements needed, State content correctly Wound/Skin Impairment: Handouts: Caring for Your Ulcer Methods: Explain/Verbal Responses: Reinforcements needed Electronic Signature(s) Signed: 11/11/2022 4:25:11 PM By: Blanche East RN Entered By: Blanche East on 11/11/2022 09:57:04 -------------------------------------------------------------------------------- Wound Assessment Details Patient Name: Date of Service: [name], [name] 11/11/2022 9:00 A M Medical Record Number: [id number] Patient Account Number: [id number] Date of Birth/Sex: Treating RN: October 30, 1945 (77 y.o. Cammy Brochure ([id number]) [id number] Page 8 of 10 Primary Care Donnelle Olmeda: Howard Pouch Other Clinician: Referring Jennesis Ramaswamy: Treating Kaedan Richert/Extender: Reginia Naas, Renee Weeks in Treatment: 0 Wound Status Wound Number: 1 Primary Etiology: T be determined o Wound Location: Right, Lateral Ankle Wound Status: Open Wounding Event: Not Known Comorbid History: Cataracts, Anemia, Arrhythmia, Hypertension Date Acquired: 07/20/2022 Weeks Of Treatment: 0 Clustered Wound: No Photos Wound Measurements Length: (cm) 1.5 Width: (cm) 1.9 Depth: (cm) 0.1 Area: (cm) 2.238 Volume: (cm) 0.224 % Reduction in Area: % Reduction in Volume: Tunneling: No Undermining: No Wound Description Classification: Full Thickness Without Exposed Support Exudate Amount: Medium Exudate Type: Serous Exudate Color: amber Structures Foul Odor After Cleansing: No Slough/Fibrino Yes Wound Bed Granulation Amount: Medium (34-66%) Exposed Structure Necrotic Amount:  Medium (34-66%) Fascia Exposed: No Necrotic Quality: Adherent Slough Fat Layer (Subcutaneous Tissue) Exposed: Yes Tendon Exposed: No Muscle Exposed: No Joint Exposed: No Bone Exposed: No Periwound Skin Texture Texture Color No Abnormalities Noted: Yes No Abnormalities Noted: Yes Moisture Temperature / Pain No Abnormalities Noted: No Temperature: No Abnormality Dry / Scaly: Yes Tenderness on Palpation: Yes Electronic Signature(s) Signed: 11/11/2022 4:25:11 PM By: Blanche East RN Entered By: Blanche East on 11/11/2022 [id number] -------------------------------------------------------------------------------- Wound Assessment Details Patient Name: Date of Service: [name], [name] 11/11/2022 9:00 A M Medical Record Number: [id number] Patient Account Number: [id number] Date of  Birth/Sex: Treating RN: 07-12-1946 (77 y.o. M) Primary Care Rakayla Ricklefs: Howard Pouch Other Clinician: Referring Ary Rudnick: Treating Latroya Ng/Extender: Reginia Naas, Renee Weeks in Treatment: 0 Wound Status ENDY, MAZZAFERRO F ([id number]) [id number] Page 9 of 10 Wound Number: 2 Primary Etiology: Venous Leg Ulcer Wound Location: Left, Medial Ankle Wound Status: Open Wounding Event: Blister Comorbid History: Cataracts, Anemia, Arrhythmia, Hypertension Date Acquired: 10/30/2022 Weeks Of Treatment: 0 Clustered Wound: No Photos Wound Measurements Length: (cm) 0.5 Width: (cm) 0.5 Depth: (cm) 0.1 Area: (cm) 0.196 Volume: (cm) 0.02 % Reduction in Area: % Reduction in Volume: Epithelialization: Small (1-33%) Tunneling: No Undermining: No Wound Description Classification: Full Thickness Without Exposed Suppor Exudate Amount: Medium Exudate Type: Serosanguineous Exudate Color: red, brown t Structures Foul Odor After Cleansing: No Slough/Fibrino Yes Wound Bed Granulation Amount: Medium (34-66%) Exposed Structure Granulation Quality: Pink Fascia Exposed: No Necrotic  Amount: Medium (34-66%) Fat Layer (Subcutaneous Tissue) Exposed: Yes Necrotic Quality: Eschar, Adherent Slough Tendon Exposed: No Muscle Exposed: No Joint Exposed: No Bone Exposed: No Periwound Skin Texture Texture Color No Abnormalities Noted: Yes No Abnormalities Noted: Yes Moisture No Abnormalities Noted: No Treatment Notes Wound #2 (Ankle) Wound Laterality: Left, Medial Cleanser Soap and Water Discharge Instruction: May shower and wash wound with dial antibacterial soap and water prior to dressing change. Peri-Wound Care Sween Lotion (Moisturizing lotion) Discharge Instruction: Apply moisturizing lotion as directed Topical Primary Dressing Maxorb Extra Calcium Alginate, 2x2 (in/in) Discharge Instruction: Apply to wound bed as instructed Secondary Dressing Zetuvit Plus 4x8 in Discharge Instruction: Apply over primary dressing as directed. Secured With Compression LYNELL, MANSOOR ([id number]) [id number] Page 10 of 10 ThreePress (3 layer compression wrap) Discharge Instruction: Apply three layer compression as directed. Compression Stockings Add-Ons Electronic Signature(s) Signed: 11/11/2022 4:25:11 PM By: Blanche East RN Entered By: Blanche East on 11/11/2022 09:49:57 -------------------------------------------------------------------------------- Vitals Details Patient Name: Date of Service: [name], [name] 11/11/2022 9:00 A M Medical Record Number: [id number] Patient Account Number: [id number] Date of Birth/Sex: Treating RN: 11/01/1945 (77 y.o. M) Primary Care Aryanne Gilleland: Howard Pouch Other Clinician: Referring Anae Hams: Treating Blanca Thornton/Extender: Reginia Naas, Renee Weeks in Treatment: 0 Vital Signs Time Taken: 08:51 Temperature (F): 97.8 Height (in): 75 Pulse (bpm): 50 Source: Stated Respiratory Rate (breaths/min): 20 Weight (lbs): 191 Blood Pressure (mmHg): 127/76 Source: Stated Reference Range: 80 - 120 mg  / dl Body Mass Index (BMI): 23.9 Electronic Signature(s) Signed: 11/11/2022 4:25:11 PM By: Blanche East RN Entered By: Blanche East on 11/11/2022 09:09:20

## 2022-11-14 MED ORDER — FUROSEMIDE 20 MG PO TABS: 20.0000 mg | ORAL_TABLET | Freq: Every day | ORAL | 3 refills | Status: DC

## 2022-11-17 ENCOUNTER — Encounter (HOSPITAL_BASED_OUTPATIENT_CLINIC_OR_DEPARTMENT_OTHER): Payer: Medicare Other | Admitting: Internal Medicine

## 2022-11-17 DIAGNOSIS — R600 Localized edema: Secondary | ICD-10-CM | POA: Diagnosis not present

## 2022-11-17 DIAGNOSIS — I83003 Varicose veins of unspecified lower extremity with ulcer of ankle: Secondary | ICD-10-CM | POA: Diagnosis not present

## 2022-11-17 DIAGNOSIS — I4821 Permanent atrial fibrillation: Secondary | ICD-10-CM | POA: Diagnosis not present

## 2022-11-17 DIAGNOSIS — L97312 Non-pressure chronic ulcer of right ankle with fat layer exposed: Secondary | ICD-10-CM | POA: Diagnosis not present

## 2022-11-17 DIAGNOSIS — Z9889 Other specified postprocedural states: Secondary | ICD-10-CM | POA: Diagnosis not present

## 2022-11-17 DIAGNOSIS — L97322 Non-pressure chronic ulcer of left ankle with fat layer exposed: Secondary | ICD-10-CM | POA: Diagnosis not present

## 2022-11-18 NOTE — Progress Notes (Signed)
LAURIANO, CZAPLA ([id number]) [id number] Page 1 of 5 Visit Report for 11/17/2022 Arrival Information Details Patient Name: Date of Service: [name]. 11/17/2022 10:00 A M Medical Record Number: [id number] Patient Account Number: [id number] Date of Birth/Sex: Treating RN: 03-Apr-1946 (77 y.o. Ernestene Mention Primary Care Chayil Gantt: Howard Pouch Other Clinician: Referring Demitris Pokorny: Treating Langley Ingalls/Extender: Otelia Santee Weeks in Treatment: 0 Visit Information History Since Last Visit Added or deleted any medications: No Patient Arrived: Ambulatory Any new allergies or adverse reactions: No Arrival Time: 09:58 Had a fall or experienced change in No Accompanied By: spouse activities of daily living that may affect Transfer Assistance: None risk of falls: Patient Identification Verified: Yes Signs or symptoms of abuse/neglect since last visito No Secondary Verification Process Completed: Yes Hospitalized since last visit: No Patient Has Alerts: Yes Implantable device outside of the clinic excluding No Patient Alerts: Patient on Blood Thinner cellular tissue based products placed in the center since last visit: Has Dressing in Place as Prescribed: Yes Has Compression in Place as Prescribed: Yes Pain Present Now: No Electronic Signature(s) Signed: 11/17/2022 4:07:02 PM By: Baruch Gouty RN, BSN Entered By: Baruch Gouty on 11/17/2022 09:59:30 -------------------------------------------------------------------------------- Compression Therapy Details Patient Name: Date of Service: [name], [name] 11/17/2022 10:00 A M Medical Record Number: [id number] Patient Account Number: [id number] Date of Birth/Sex: Treating RN: 1945-09-16 (77 y.o. Ernestene Mention Primary Care Caydn Justen: Howard Pouch Other Clinician: Referring Keyairra Kolinski: Treating Jayion Schneck/Extender: Otelia Santee Weeks in Treatment: 0 Compression  Therapy Performed for Wound Assessment: Wound #1 Right,Lateral Ankle Performed By: Clinician Baruch Gouty, RN Compression Type: Three Hydrologist) Signed: 11/17/2022 4:07:02 PM By: Baruch Gouty RN, BSN Entered By: Baruch Gouty on 11/17/2022 10:27:53 -------------------------------------------------------------------------------- Compression Therapy Details Patient Name: Date of Service: [name], [name] 11/17/2022 10:00 A M Medical Record Number: [id number] Patient Account Number: [id number] Date of Birth/Sex: Treating RN: Sep 29, 1945 (77 y.o. Ernestene Mention Primary Care Teng Decou: Howard Pouch Other Clinician: Referring Ndrew Creason: Treating Shawnia Vizcarrondo/Extender: Otelia Santee Weeks in Treatment: 0 Compression Therapy Performed for Wound Assessment: Wound #2 Left,Medial Ankle Performed By: Clinician Baruch Gouty, RN Compression Type: Three Hydrologist) Signed: 11/17/2022 4:07:02 PM By: Baruch Gouty RN, BSN Entered By: Baruch Gouty on 11/17/2022 10:27:53 [name] ([id number]) [id number] Page 2 of 5 -------------------------------------------------------------------------------- Encounter Discharge Information Details Patient Name: Date of Service: [name]. 11/17/2022 10:00 A M Medical Record Number: [id number] Patient Account Number: [id number] Date of Birth/Sex: Treating RN: July 22, 1946 (77 y.o. Ernestene Mention Primary Care Kegan Mckeithan: Howard Pouch Other Clinician: Referring Erman Thum: Treating Jennene Downie/Extender: Otelia Santee Weeks in Treatment: 0 Encounter Discharge Information Items Discharge Condition: Stable Ambulatory Status: Ambulatory Discharge Destination: Home Transportation: Private Auto Accompanied By: spouse Schedule Follow-up Appointment: Yes Clinical Summary of Care: Patient Declined Electronic Signature(s) Signed: 11/17/2022 4:07:02 PM  By: Baruch Gouty RN, BSN Entered By: Baruch Gouty on 11/17/2022 10:28:43 -------------------------------------------------------------------------------- Patient/Caregiver Education Details Patient Name: Date of Service: [name], [name] 3/14/2024andnbsp10:00 A M Medical Record Number: [id number] Patient Account Number: [id number] Date of Birth/Gender: Treating RN: 1946-01-09 (77 y.o. Ernestene Mention Primary Care Physician: Howard Pouch Other Clinician: Referring Physician: Treating Physician/Extender: Berneice Gandy in Treatment: 0 Education Assessment Education Provided To: Patient Education Topics Provided Venous: Methods: Explain/Verbal Responses: Reinforcements needed, State content correctly Wound/Skin Impairment: Methods: Explain/Verbal Responses: Reinforcements needed, State content  correctly Electronic Signature(s) Signed: 11/17/2022 4:07:02 PM By: Baruch Gouty RN, BSN Entered By: Baruch Gouty on 11/17/2022 10:28:26 -------------------------------------------------------------------------------- Wound Assessment Details Patient Name: Date of Service: [name], [name] 11/17/2022 10:00 A M Medical Record Number: [id number] Patient Account Number: [id number] Date of Birth/Sex: Treating RN: [birth year] (77 y.o. Ernestene Mention Primary Care Goebel Hellums: Howard Pouch Other Clinician: Referring Curvin Hunger: Treating Hussain Maimone/Extender: Jorene Guest, Renee Weeks in Treatment: 0 Wound Status Wound Number: 1 Primary Etiology: Venous Leg Ulcer Wound Location: Right, Lateral Ankle Wound Status: Open KENLEY, MORGANO ([id number]) [id number] Page 3 of 5 Wounding Event: Blister Date Acquired: 07/20/2022 Weeks Of Treatment: 0 Clustered Wound: No Wound Measurements Length: (cm) 1.5 Width: (cm) 1.9 Depth: (cm) 0.1 Area: (cm) 2.238 Volume: (cm) 0.224 % Reduction in Area: 0% % Reduction in Volume:  0% Wound Description Classification: Full Thickness Without Exposed Suppor Exudate Amount: Medium Exudate Type: Serous Exudate Color: amber t Structures Periwound Skin Texture Texture Color No Abnormalities Noted: No No Abnormalities Noted: No Moisture No Abnormalities Noted: No Treatment Notes Wound #1 (Ankle) Wound Laterality: Right, Lateral Cleanser Soap and Water Discharge Instruction: May shower and wash wound with dial antibacterial soap and water prior to dressing change. Peri-Wound Care Sween Lotion (Moisturizing lotion) Discharge Instruction: Apply moisturizing lotion as directed Topical Primary Dressing Maxorb Extra Calcium Alginate, 2x2 (in/in) Discharge Instruction: Apply to wound bed as instructed Secondary Dressing Zetuvit Plus 4x8 in Discharge Instruction: Apply over primary dressing as directed. Secured With Compression Wrap ThreePress (3 layer compression wrap) Discharge Instruction: Apply three layer compression as directed. Compression Stockings Add-Ons Electronic Signature(s) Signed: 11/17/2022 4:07:02 PM By: Baruch Gouty RN, BSN Entered By: Baruch Gouty on 11/17/2022 10:27:34 -------------------------------------------------------------------------------- Wound Assessment Details Patient Name: Date of Service: [name], [name] 11/17/2022 10:00 A M Medical Record Number: [id number] Patient Account Number: [id number] Date of Birth/Sex: Treating RN: 15-Feb-1946 (77 y.o. Ernestene Mention Primary Care Edilia Ghuman: Howard Pouch Other Clinician: Referring Lyndsee Casa: Treating Chynah Orihuela/Extender: Jorene Guest, Renee Weeks in Treatment: 0 Wound Status Wound Number: 2 Primary Etiology: Venous Leg Ulcer KENDERRICK, MUSARRA ([id number]) [id number] Page 4 of 5 Wound Location: Left, Medial Ankle Wound Status: Open Wounding Event: Blister Date Acquired: 10/30/2022 Weeks Of Treatment: 0 Clustered Wound: No Wound  Measurements Length: (cm) 0.5 Width: (cm) 0.5 Depth: (cm) 0.1 Area: (cm) 0.196 Volume: (cm) 0.02 % Reduction in Area: 0% % Reduction in Volume: 0% Wound Description Classification: Full Thickness Without Exposed Suppor Exudate Amount: Medium Exudate Type: Serosanguineous Exudate Color: red, brown t Structures Periwound Skin Texture Texture Color No Abnormalities Noted: No No Abnormalities Noted: No Moisture No Abnormalities Noted: No Treatment Notes Wound #2 (Ankle) Wound Laterality: Left, Medial Cleanser Soap and Water Discharge Instruction: May shower and wash wound with dial antibacterial soap and water prior to dressing change. Peri-Wound Care Sween Lotion (Moisturizing lotion) Discharge Instruction: Apply moisturizing lotion as directed Topical Primary Dressing Maxorb Extra Calcium Alginate, 2x2 (in/in) Discharge Instruction: Apply to wound bed as instructed Secondary Dressing Zetuvit Plus 4x8 in Discharge Instruction: Apply over primary dressing as directed. Secured With Compression Wrap ThreePress (3 layer compression wrap) Discharge Instruction: Apply three layer compression as directed. Compression Stockings Add-Ons Electronic Signature(s) Signed: 11/17/2022 4:07:02 PM By: Baruch Gouty RN, BSN Entered By: Baruch Gouty on 11/17/2022 10:27:34 -------------------------------------------------------------------------------- Vitals Details Patient Name: Date of Service: [name], [name] 11/17/2022 10:00 A M Medical Record Number: [id number] Patient Account Number: [id number] Date of Birth/Sex:  Treating RN: 08/04/1946 (77 y.o. Ernestene Mention Primary Care Cain Fitzhenry: Howard Pouch Other Clinician: Referring Nyjae Hodge: Treating Antonius Hartlage/Extender: Jorene Guest, Renee Weeks in Treatment: 0 Vital Signs GILROY, MCKELLIPS F ([id number]) [id number] Page 5 of 5 Time Taken: 09:59 Temperature (F): 97.8 Height (in):  75 Pulse (bpm): 78 Weight (lbs): 191 Respiratory Rate (breaths/min): 18 Body Mass Index (BMI): 23.9 Blood Pressure (mmHg): 117/65 Reference Range: 80 - 120 mg / dl Electronic Signature(s) Signed: 11/17/2022 4:07:02 PM By: Baruch Gouty RN, BSN Entered By: Baruch Gouty on 11/17/2022 10:00:19

## 2022-11-18 NOTE — Progress Notes (Signed)
LEBURN, UNIS ([id number]) [id number] Page 1 of 1 Visit Report for 11/17/2022 SuperBill Details Patient Name: Date of Service: [name]. 11/17/2022 Medical Record Number: [id number] Patient Account Number: [id number] Date of Birth/Sex: Treating RN: 1946/04/11 (77 y.o. 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 Electronic Signature(s) Signed: 11/17/2022 3:34:03 PM By: Kalman Shan DO Signed: 11/17/2022 4:07:02 PM By: Baruch Gouty RN, BSN Entered By: Baruch Gouty on 11/17/2022 10:29:06

## 2022-11-21 DIAGNOSIS — I4821 Permanent atrial fibrillation: Secondary | ICD-10-CM | POA: Diagnosis not present

## 2022-11-21 DIAGNOSIS — I493 Ventricular premature depolarization: Secondary | ICD-10-CM | POA: Diagnosis not present

## 2022-11-23 ENCOUNTER — Other Ambulatory Visit (HOSPITAL_COMMUNITY): Payer: Self-pay | Admitting: General Surgery

## 2022-11-23 DIAGNOSIS — R609 Edema, unspecified: Secondary | ICD-10-CM

## 2022-11-24 ENCOUNTER — Ambulatory Visit (HOSPITAL_COMMUNITY)
Admission: RE | Admit: 2022-11-24 | Discharge: 2022-11-24 | Disposition: A | Discharge status: Home / Self Care | Payer: Medicare Other | Source: Ambulatory Visit | Attending: Vascular Surgery | Admitting: Vascular Surgery

## 2022-11-24 DIAGNOSIS — R609 Edema, unspecified: Secondary | ICD-10-CM | POA: Insufficient documentation

## 2022-11-25 ENCOUNTER — Encounter (HOSPITAL_BASED_OUTPATIENT_CLINIC_OR_DEPARTMENT_OTHER): Payer: Medicare Other | Attending: General Surgery | Admitting: General Surgery

## 2022-11-25 DIAGNOSIS — I872 Venous insufficiency (chronic) (peripheral): Secondary | ICD-10-CM | POA: Diagnosis not present

## 2022-11-25 DIAGNOSIS — R600 Localized edema: Secondary | ICD-10-CM | POA: Diagnosis not present

## 2022-11-25 DIAGNOSIS — I83003 Varicose veins of unspecified lower extremity with ulcer of ankle: Secondary | ICD-10-CM | POA: Diagnosis not present

## 2022-11-25 DIAGNOSIS — L97312 Non-pressure chronic ulcer of right ankle with fat layer exposed: Secondary | ICD-10-CM | POA: Insufficient documentation

## 2022-11-25 DIAGNOSIS — I4821 Permanent atrial fibrillation: Secondary | ICD-10-CM | POA: Diagnosis not present

## 2022-11-26 NOTE — Progress Notes (Signed)
FLOREN, MURASKI ([id number]) [id number] Page 1 of 9 Visit Report for 11/25/2022 Arrival Information Details Patient Name: Date of Service: Mike Day CK F. 11/25/2022 12:30 PM Medical Record Number: [id number] Patient Account Number: [id number] Date of Birth/Sex: Treating RN: 21-Mar-1946 (77 y.o. Mike Day Primary Care Erroll Wilbourne: Howard Pouch Other Clinician: Referring Maxene Byington: Treating Mike Day/Extender: Volney Presser Weeks in Treatment: 2 Visit Information History Since Last Visit Added or deleted any medications: No Patient Arrived: Ambulatory Any new allergies or adverse reactions: No Arrival Time: 12:36 Had a fall or experienced change in No Accompanied By: wife activities of daily living that may affect Transfer Assistance: None risk of falls: Patient Identification Verified: Yes Signs or symptoms of abuse/neglect since last visito No Secondary Verification Process Completed: Yes Hospitalized since last visit: No Patient Requires Transmission-Based Precautions: No Implantable device outside of the clinic excluding No Patient Has Alerts: Yes cellular tissue based products placed in the center Patient Alerts: Patient on Blood Thinner since last visit: Has Compression in Place as Prescribed: Yes Pain Present Now: No Electronic Signature(s) Signed: 11/25/2022 4:08:25 PM By: Mike East RN Entered By: Mike Day on 11/25/2022 12:37:11 -------------------------------------------------------------------------------- Compression Therapy Details Patient Name: Date of Service: Mike Day, [name] 11/25/2022 12:30 PM Medical Record Number: [id number] Patient Account Number: [id number] Date of Birth/Sex: Treating RN: 09-27-1945 (77 y.o. Mike Day Primary Care Kacia Halley: Howard Pouch Other Clinician: Referring Kalasia Crafton: Treating Mike Day/Extender: Volney Presser Weeks in Treatment: 2 Compression Therapy  Performed for Wound Assessment: Wound #1 Right,Lateral Ankle Performed By: Clinician Mike East, RN Compression Type: Three Layer Post Procedure Diagnosis Same as Pre-procedure Electronic Signature(s) Signed: 11/25/2022 4:08:25 PM By: Mike East RN Entered By: Mike Day on 11/25/2022 13:19:40 -------------------------------------------------------------------------------- Encounter Discharge Information Details Patient Name: Date of Service: Mike Day, [name] 11/25/2022 12:30 PM Medical Record Number: [id number] Patient Account Number: [id number] Date of Birth/Sex: Treating RN: Oct 13, 1945 (77 y.o. Mike Day Primary Care Zair Borawski: Howard Pouch Other Clinician: Referring Lavert Matousek: Treating Mike Day/Extender: Volney Presser Weeks in Treatment: 2 Encounter Discharge Information Items Post Procedure Vitals Discharge Condition: Stable Temperature (F): 97.3 Ambulatory Status: Ambulatory Pulse (bpm): 56 Discharge Destination: Home Respiratory Rate (breaths/min): 18 Transportation: Private Auto Blood Pressure (mmHg): 134/87 Accompanied By: self Mike Day ([id number]) [id number] Page 2 of 9 Schedule Follow-up Appointment: Yes Clinical Summary of Care: Electronic Signature(s) Signed: 11/25/2022 1:39:42 PM By: Mike East RN Previous Signature: 11/25/2022 1:00:06 PM Version By: Mike East RN Entered By: Mike Day on 11/25/2022 13:39:42 -------------------------------------------------------------------------------- Lower Extremity Assessment Details Patient Name: Date of Service: Mike Day, [name] 11/25/2022 12:30 PM Medical Record Number: [id number] Patient Account Number: [id number] Date of Birth/Sex: Treating RN: Nov 22, 1945 (77 y.o. Mike Day Primary Care Savayah Waltrip: Howard Pouch Other Clinician: Referring Sehaj Kolden: Treating Mike Day/Extender: Reginia Naas, Renee Weeks in Treatment: 2 Edema  Assessment Assessed: [Left: No] [Right: No] [Left: Edema] [Right: :] Calf Left: Right: Point of Measurement: From Medial Instep 35 cm 34.5 cm Ankle Left: Right: Point of Measurement: From Medial Instep 24 cm 24.4 cm Vascular Assessment Pulses: Dorsalis Pedis Palpable: [Left:Yes] [Right:Yes] Electronic Signature(s) Signed: 11/25/2022 4:08:25 PM By: Mike East RN Entered By: Mike Day on 11/25/2022 12:41:14 -------------------------------------------------------------------------------- Multi Wound Chart Details Patient Name: Date of Service: Mike Day, [name] 11/25/2022 12:30 PM Medical Record Number: [id number] Patient Account Number: [id number] Date of Birth/Sex: Treating RN: [birth year] (77 y.o. M)  Primary Care Brailey Buescher: Howard Pouch Other Clinician: Referring Kadence Mikkelson: Treating Mike Day/Extender: Reginia Naas, Renee Weeks in Treatment: 2 Vital Signs Height(in): 75 Pulse(bpm): 56 Weight(lbs): 191 Blood Pressure(mmHg): 134/87 Body Mass Index(BMI): 23.9 Temperature(F): 97.3 Respiratory Rate(breaths/min): 18 [1:Photos:] [N/A:N/A [id number] Page 3 of 9] Right, Lateral Ankle Left, Medial Ankle N/A Wound Location: Blister Blister N/A Wounding Event: Venous Leg Ulcer Venous Leg Ulcer N/A Primary Etiology: Cataracts, Anemia, Arrhythmia, Cataracts, Anemia, Arrhythmia, N/A Comorbid History: Hypertension Hypertension 07/20/2022 10/30/2022 N/A Date Acquired: 2 2 N/A Weeks of Treatment: Open Open N/A Wound Status: No No N/A Wound Recurrence: 0.8x0.3x0.2 0.1x0.1x0.1 N/A Measurements L x W x D (cm) 0.188 0.008 N/A A (cm) : rea 0.038 0.001 N/A Volume (cm) : 91.60% 95.90% N/A % Reduction in A rea: 83.00% 95.00% N/A % Reduction in Volume: Full Thickness Without Exposed Full Thickness Without Exposed N/A Classification: Support Structures Support Structures Medium None Present N/A Exudate A mount: Serosanguineous N/A  N/A Exudate Type: red, brown N/A N/A Exudate Color: Large (67-100%) Large (67-100%) N/A Granulation A mount: Pink N/A N/A Granulation Quality: Small (1-33%) None Present (0%) N/A Necrotic A mount: Eschar, Adherent Slough N/A N/A Necrotic Tissue: Fat Layer (Subcutaneous Tissue): Yes Fascia: No N/A Exposed Structures: Fascia: No Fat Layer (Subcutaneous Tissue): No Tendon: No Tendon: No Muscle: No Muscle: No Joint: No Joint: No Bone: No Bone: No Small (1-33%) None N/A Epithelialization: Debridement - Selective/Open Wound N/A N/A Debridement: Pre-procedure Verification/Time Out 13:10 N/A N/A Taken: Necrotic/Eschar, Slough N/A N/A Tissue Debrided: Non-Viable Tissue N/A N/A Level: 0.24 N/A N/A Debridement A (sq cm): rea Curette N/A N/A Instrument: Minimum N/A N/A Bleeding: Pressure N/A N/A Hemostasis A chieved: Procedure was tolerated well N/A N/A Debridement Treatment Response: 0.8x0.3x0.2 N/A N/A Post Debridement Measurements L x W x D (cm) 0.038 N/A N/A Post Debridement Volume: (cm) No Abnormalities Noted No Abnormalities Noted N/A Periwound Skin Texture: Maceration: No Dry/Scaly: Yes N/A Periwound Skin Moisture: Dry/Scaly: No No Abnormalities Noted No Abnormalities Noted N/A Periwound Skin Color: No Abnormality N/A N/A Temperature: Yes N/A N/A Tenderness on Palpation: Debridement N/A N/A Procedures Performed: Treatment Notes Wound #1 (Ankle) Wound Laterality: Right, Lateral Cleanser Soap and Water Discharge Instruction: May shower and wash wound with dial antibacterial soap and water prior to dressing change. Peri-Wound Care Sween Lotion (Moisturizing lotion) Discharge Instruction: Apply moisturizing lotion as directed Topical Primary Dressing Maxorb Extra Calcium Alginate, 2x2 (in/in) Discharge Instruction: Apply to wound bed as instructed Secondary Dressing Zetuvit Plus 4x8 in Discharge Instruction: Apply over primary dressing as  directed. Secured With Compression Wrap ThreePress (3 layer compression wrap) Discharge Instruction: Apply three layer compression as directed. Compression Stockings ELMON, SELMAN ([id number]) [id number] Page 4 of 9 Add-Ons Wound #2 (Ankle) Wound Laterality: Left, Medial Cleanser Soap and Water Discharge Instruction: May shower and wash wound with dial antibacterial soap and water prior to dressing change. Peri-Wound Care Sween Lotion (Moisturizing lotion) Discharge Instruction: Apply moisturizing lotion as directed Topical Primary Dressing Maxorb Extra Calcium Alginate, 2x2 (in/in) Discharge Instruction: Apply to wound bed as instructed Secondary Dressing Zetuvit Plus 4x8 in Discharge Instruction: Apply over primary dressing as directed. Secured With Compression Wrap ThreePress (3 layer compression wrap) Discharge Instruction: Apply three layer compression as directed. Compression Stockings Add-Ons Electronic Signature(s) Signed: 11/25/2022 1:19:26 PM By: Fredirick Maudlin MD FACS Entered By: Fredirick Maudlin on 11/25/2022 13:19:26 -------------------------------------------------------------------------------- Multi-Disciplinary Care Plan Details Patient Name: Date of Service: Mike Day, [name] 11/25/2022 12:30 PM Medical Record Number: [id number] Patient Account  Number: [id number] Date of Birth/Sex: Treating RN: 06-10-1946 (77 y.o. Mike Day Primary Care Shemiah Rosch: Howard Pouch Other Clinician: Referring Tayjon Halladay: Treating Horton Ellithorpe/Extender: Volney Presser Weeks in Treatment: 2 Active Inactive Nutrition Nursing Diagnoses: Potential for alteratiion in Nutrition/Potential for imbalanced nutrition Goals: Patient/caregiver agrees to and verbalizes understanding of need to use nutritional supplements and/or vitamins as prescribed Date Initiated: 11/11/2022 Target Resolution Date: 01/03/2023 Goal Status:  Active Interventions: Assess patient nutrition upon admission and as needed per policy Treatment Activities: Dietary management education, guidance and counseling : 11/11/2022 Notes: Orientation to the Wound Care Program Nursing Diagnoses: Knowledge deficit related to the wound healing center program MATHEU, CAGNINA ([id number]) [id number] Page 5 of 9 Goals: Patient/caregiver will verbalize understanding of the [facility] Program Date Initiated: 11/11/2022 Target Resolution Date: 12/06/2022 Goal Status: Active Interventions: Provide education on orientation to the wound center Notes: Venous Leg Ulcer Nursing Diagnoses: Potential for venous Insuffiency (use before diagnosis confirmed) Goals: Patient will maintain optimal edema control Date Initiated: 11/11/2022 Target Resolution Date: 01/03/2023 Goal Status: Active Interventions: Compression as ordered Provide education on venous insufficiency Treatment Activities: Therapeutic compression applied : 11/11/2022 Notes: Wound/Skin Impairment Nursing Diagnoses: Impaired tissue integrity Goals: Ulcer/skin breakdown will have a volume reduction of 30% by week 4 Date Initiated: 11/11/2022 Target Resolution Date: 01/03/2023 Goal Status: Active Interventions: Assess ulceration(s) every visit Provide education on smoking Treatment Activities: Skin care regimen initiated : 11/11/2022 Notes: Electronic Signature(s) Signed: 11/25/2022 4:08:25 PM By: Mike East RN Entered By: Mike Day on 11/25/2022 12:50:51 -------------------------------------------------------------------------------- Pain Assessment Details Patient Name: Date of Service: Mike Day, [name] 11/25/2022 12:30 PM Medical Record Number: [id number] Patient Account Number: [id number] Date of Birth/Sex: Treating RN: 01-13-1946 (78 y.o. Mike Day Primary Care Kinze Labo: Howard Pouch Other Clinician: Referring Walterine Amodei: Treating  Gaspare Netzel/Extender: Volney Presser Weeks in Treatment: 2 Active Problems Location of Pain Severity and Description of Pain Patient Has Paino No Site Locations Rate the pain. JARIAH, FISCHEL ([id number]) [id number] Page 6 of 9 Rate the pain. Current Pain Level: 0 Pain Management and Medication Current Pain Management: Electronic Signature(s) Signed: 11/25/2022 4:08:25 PM By: Mike East RN Entered By: Mike Day on 11/25/2022 12:38:47 -------------------------------------------------------------------------------- Patient/Caregiver Education Details Patient Name: Date of Service: Mike Day, [name] 3/22/2024andnbsp12:30 PM Medical Record Number: [id number] Patient Account Number: [id number] Date of Birth/Gender: Treating RN: 07/16/1946 (77 y.o. Mike Day Primary Care Physician: Howard Pouch Other Clinician: Referring Physician: Treating Physician/Extender: Dorris Carnes in Treatment: 2 Education Assessment Education Provided To: Patient Education Topics Provided Wound Debridement: Methods: Explain/Verbal Responses: Reinforcements needed, State content correctly Wound/Skin Impairment: Methods: Explain/Verbal Responses: Reinforcements needed, State content correctly Electronic Signature(s) Signed: 11/25/2022 4:08:25 PM By: Mike East RN Entered By: Mike Day on 11/25/2022 12:50:16 -------------------------------------------------------------------------------- Wound Assessment Details Patient Name: Date of Service: Mike Day, [name] 11/25/2022 12:30 PM Medical Record Number: [id number] Patient Account Number: [id number] Date of Birth/Sex: Treating RN: 26-Oct-1945 (78 y.o. Mike Day Primary Care Ivin Rosenbloom: Howard Pouch Other Clinician: Referring Christpoher Sievers: Treating Amijah Timothy/Extender: Reginia Naas, Renee Weeks in Treatment: 2 Wound Status Wound Number: 1 Primary Etiology:  Venous Leg Ulcer Wound Location: Right, Lateral Ankle Wound Status: Open BRILEY, TOWERS ([id number]) [id number] Page 7 of 9 Wounding Event: Blister Comorbid History: Cataracts, Anemia, Arrhythmia, Hypertension Date Acquired: 07/20/2022 Weeks Of Treatment: 2 Clustered Wound: No Photos Wound Measurements Length: (cm) 0.8 Width: (cm) 0.3 Depth: (cm) 0.2 Area: (  cm) 0.188 Volume: (cm) 0.038 % Reduction in Area: 91.6% % Reduction in Volume: 83% Epithelialization: Small (1-33%) Tunneling: No Undermining: No Wound Description Classification: Full Thickness Without Exposed Su Exudate Amount: Medium Exudate Type: Serosanguineous Exudate Color: red, brown pport Structures Wound Bed Granulation Amount: Large (67-100%) Exposed Structure Granulation Quality: Pink Fascia Exposed: No Necrotic Amount: Small (1-33%) Fat Layer (Subcutaneous Tissue) Exposed: Yes Necrotic Quality: Eschar, Adherent Slough Tendon Exposed: No Muscle Exposed: No Joint Exposed: No Bone Exposed: No Periwound Skin Texture Texture Color No Abnormalities Noted: Yes No Abnormalities Noted: Yes Moisture Temperature / Pain No Abnormalities Noted: No Temperature: No Abnormality Dry / Scaly: No Tenderness on Palpation: Yes Maceration: No Treatment Notes Wound #1 (Ankle) Wound Laterality: Right, Lateral Cleanser Soap and Water Discharge Instruction: May shower and wash wound with dial antibacterial soap and water prior to dressing change. Peri-Wound Care Sween Lotion (Moisturizing lotion) Discharge Instruction: Apply moisturizing lotion as directed Topical Primary Dressing Maxorb Extra Calcium Alginate, 2x2 (in/in) Discharge Instruction: Apply to wound bed as instructed Secondary Dressing Zetuvit Plus 4x8 in Discharge Instruction: Apply over primary dressing as directed. Secured With Compression DRAPER, VALLIS ([id number]) [id number] Page 8 of  9 ThreePress (3 layer compression wrap) Discharge Instruction: Apply three layer compression as directed. Compression Stockings Add-Ons Electronic Signature(s) Signed: 11/25/2022 4:08:25 PM By: Mike East RN Entered By: Mike Day on 11/25/2022 12:47:17 -------------------------------------------------------------------------------- Wound Assessment Details Patient Name: Date of Service: Mike Day, [name] 11/25/2022 12:30 PM Medical Record Number: [id number] Patient Account Number: [id number] Date of Birth/Sex: Treating RN: 07-10-1946 (77 y.o. Mike Day Primary Care Altariq Goodall: Howard Pouch Other Clinician: Referring Brynja Marker: Treating Meridee Branum/Extender: Reginia Naas, Renee Weeks in Treatment: 2 Wound Status Wound Number: 2 Primary Etiology: Venous Leg Ulcer Wound Location: Left, Medial Ankle Wound Status: Healed - Epithelialized Wounding Event: Blister Comorbid History: Cataracts, Anemia, Arrhythmia, Hypertension Date Acquired: 10/30/2022 Weeks Of Treatment: 2 Clustered Wound: No Photos Wound Measurements Length: (cm) Width: (cm) Depth: (cm) Area: (cm) Volume: (cm) 0 % Reduction in Area: 95.9% 0 % Reduction in Volume: 95% 0 Epithelialization: None 0 Tunneling: No 0 Undermining: No Wound Description Classification: Full Thickness Without Exposed Support Structures Exudate Amount: None Present Foul Odor After Cleansing: No Slough/Fibrino No Wound Bed Granulation Amount: Large (67-100%) Exposed Structure Necrotic Amount: None Present (0%) Fascia Exposed: No Fat Layer (Subcutaneous Tissue) Exposed: No Tendon Exposed: No Muscle Exposed: No Joint Exposed: No Bone Exposed: No Periwound Skin Texture Texture Color No Abnormalities Noted: Yes No Abnormalities Noted: Yes Moisture No Abnormalities Noted: No Dry / ScalyBERLYN, MICKE ([id number]) [id number] Page 9 of 9 Treatment Notes Wound #2 (Ankle) Wound  Laterality: Left, Medial Cleanser Soap and Water Discharge Instruction: May shower and wash wound with dial antibacterial soap and water prior to dressing change. Peri-Wound Care Sween Lotion (Moisturizing lotion) Discharge Instruction: Apply moisturizing lotion as directed Topical Primary Dressing Maxorb Extra Calcium Alginate, 2x2 (in/in) Discharge Instruction: Apply to wound bed as instructed Secondary Dressing Zetuvit Plus 4x8 in Discharge Instruction: Apply over primary dressing as directed. Secured With Compression Wrap ThreePress (3 layer compression wrap) Discharge Instruction: Apply three layer compression as directed. Compression Stockings Add-Ons Electronic Signature(s) Signed: 11/25/2022 4:08:25 PM By: Mike East RN Entered By: Mike Day on 11/25/2022 13:38:59 -------------------------------------------------------------------------------- Vitals Details Patient Name: Date of Service: Mike Day, [name] 11/25/2022 12:30 PM Medical Record Number: [id number] Patient Account Number: [id number] Date of Birth/Sex: Treating RN: 03/17/1946 (77 y.o. M)  Mike Day Primary Care Jaelee Laughter: Howard Pouch Other Clinician: Referring Johathan Province: Treating Avanna Sowder/Extender: Reginia Naas, Renee Weeks in Treatment: 2 Vital Signs Time Taken: 12:38 Temperature (F): 97.3 Height (in): 75 Pulse (bpm): 56 Weight (lbs): 191 Respiratory Rate (breaths/min): 18 Body Mass Index (BMI): 23.9 Blood Pressure (mmHg): 134/87 Reference Range: 80 - 120 mg / dl Electronic Signature(s) Signed: 11/25/2022 4:08:25 PM By: Mike East RN Entered By: Mike Day on 11/25/2022 12:38:40

## 2022-11-26 NOTE — Progress Notes (Signed)
Mike Day (Mike Day) [id number] Page 1 of 9 Visit Report for 11/25/2022 Chief Complaint Document Details Patient Name: Date of Service: Mike Day. 11/25/2022 12:30 PM Medical Record Number: Mike Day Patient Account Number: [id number] Date of Birth/Sex: Treating RN: [birth year] (77 y.o. M) Primary Care Provider: Howard Day Other Clinician: Referring Provider: Treating Provider/Extender: Mike Day, Mike Day in Treatment: 2 Information Obtained from: Patient Chief Complaint Patient presents for treatment of open ulcers due to venous insufficiency Electronic Signature(s) Signed: 11/25/2022 1:19:32 PM By: Mike Day Day FACS Entered By: Mike Day on 11/25/2022 13:19:32 -------------------------------------------------------------------------------- Debridement Details Patient Name: Date of Service: Mike Day, Mike Day. 11/25/2022 12:30 PM Medical Record Number: Mike Day Patient Account Number: [id number] Date of Birth/Sex: Treating RN: [birth year] (77 y.o. [name] Primary Care Provider: Howard Day Other Clinician: Referring Provider: Treating Provider/Extender: Mike Day Day in Treatment: 2 Debridement Performed for Assessment: Wound #1 Right,Lateral Ankle Performed By: Physician Mike Day, Day Debridement Type: Debridement Severity of Tissue Pre Debridement: Fat layer exposed Level of Consciousness (Pre-procedure): Awake and Alert Pre-procedure Verification/Time Out Yes - 13:10 Taken: Start Time: 13:11 T Area Debrided (L x W): otal 0.8 (cm) x 0.3 (cm) = 0.24 (cm) Tissue and other material debrided: Non-Viable, Eschar, Slough, Slough Level: Non-Viable Tissue Debridement Description: Selective/Open Wound Instrument: Curette Bleeding: Minimum Hemostasis Achieved: Pressure Response to Treatment: Procedure was tolerated well Level of Consciousness (Post- Awake and  Alert procedure): Post Debridement Measurements of Total Wound Length: (cm) 0.8 Width: (cm) 0.3 Depth: (cm) 0.2 Volume: (cm) 0.038 Character of Wound/Ulcer Post Debridement: Requires Further Debridement Severity of Tissue Post Debridement: Fat layer exposed Post Procedure Diagnosis Same as Pre-procedure Notes Scribed for Dr. Celine Day by Mike East, RN Electronic Signature(s) Signed: 11/25/2022 1:35:04 PM By: Mike Day Day FACS Signed: 11/25/2022 4:08:25 PM By: Mike East RN Entered By: Mike Day on 11/25/2022 13:19:20 Mike Day (Mike Day) [id number] Page 2 of 9 -------------------------------------------------------------------------------- HPI Details Patient Name: Date of Service: Mike Day. 11/25/2022 12:30 PM Medical Record Number: Mike Day Patient Account Number: [id number] Date of Birth/Sex: Treating RN: 14-May-1946 (77 y.o. M) Primary Care Provider: Howard Day Other Clinician: Referring Provider: Treating Provider/Extender: Mike Day, Mike Day in Treatment: 2 History of Present Illness HPI Description: ADMISSION 11/11/2022 This is a 77 year old man who suffered a fall in November 2023. He had a left hip fracture and left olecranon fracture. He had surgical treatment for both of these and was in rehabilitation for some time. As he became more active, he noted that his legs began to swell. He was seen by his primary care provider on February 16. She initiated Lasix 20 mg daily. He was also noted to have ulcers on both ankles. He was referred to the wound care center. While waiting for his appointment, he was seen by cardiology and had an updated echocardiogram performed. While he has severe left atrial enlargement, his ejection fraction is normal. He is not a diabetic. He has been applying bag balm to his ulcers. He does not wear compression stockings. On his right lateral ankle, there is a semilinear  ulcer with a typical stasis ulcer appearance. There is slough and eschar accumulation. On his left medial ankle, just posterior to the malleolus, there is a small circular ulcer with slough and eschar present. No concern for infection at either site. 11/25/2022: The left medial ankle wound is healed. The right lateral ankle wound is smaller.  There is some slough and eschar accumulation present. He did have his reflux studies performed and he has significant saphenous vein reflux bilaterally. Edema control is improved. Electronic Signature(s) Signed: 11/25/2022 1:20:27 PM By: Mike Day Day FACS Entered By: Mike Day on 11/25/2022 13:20:26 -------------------------------------------------------------------------------- Physical Exam Details Patient Name: Date of Service: Mike Day, Mike Day. 11/25/2022 12:30 PM Medical Record Number: Mike Day Patient Account Number: [id number] Date of Birth/Sex: Treating RN: [birth year] (77 y.o. M) Primary Care Provider: Howard Day Other Clinician: Referring Provider: Treating Provider/Extender: Mike Day, Mike Day in Treatment: 2 Constitutional . Slightly bradycardic. . . no acute distress. Respiratory Normal work of breathing on room air. Notes 11/25/2022: The left medial ankle wound is healed. The right lateral ankle wound is smaller. There is some slough and eschar accumulation present. Edema control is improved. Electronic Signature(s) Signed: 11/25/2022 1:21:09 PM By: Mike Day Day FACS Entered By: Mike Day on 11/25/2022 13:21:09 -------------------------------------------------------------------------------- Physician Orders Details Patient Name: Date of Service: Mike Day, Mike Day. 11/25/2022 12:30 PM Medical Record Number: Mike Day Patient Account Number: [id number] Date of Birth/Sex: Treating RN: 03-Jan-1946 (77 y.o. 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 - Dr. Celine Day Rm 4 Return A Friday 11/25/22 at 10:30am Nurse Visit: - Thursday 11/17/22 at 10:00am Dressing change Anesthetic Wound #1 Right,Lateral Ankle (In clinic) Topical Lidocaine 5% applied to wound bed - prior to debridment Bathing/ Shower/ Hygiene May shower with protection but do not get wound dressing(s) wet. Protect dressing(s) with water repellant cover (for example, large plastic bag) or a cast cover and may then take shower. - Please keep dressings clean and dry at all times Edema Control - Lymphedema / SCD / Other Elevate legs to the level of the heart or above for 30 minutes daily and/or when sitting for 3-4 times a day throughout the day. Avoid standing for long periods of time. Moisturize legs daily. Wound Treatment Wound #1 - Ankle Wound Laterality: Right, Lateral Cleanser: Soap and Water 1 x Per Week Discharge Instructions: May shower and wash wound with dial antibacterial soap and water prior to dressing change. Peri-Wound Care: Sween Lotion (Moisturizing lotion) 1 x Per Week Discharge Instructions: Apply moisturizing lotion as directed Prim Dressing: Maxorb Extra Calcium Alginate, 2x2 (in/in) 1 x Per Week ary Discharge Instructions: Apply to wound bed as instructed Secondary Dressing: Zetuvit Plus 4x8 in 1 x Per Week Discharge Instructions: Apply over primary dressing as directed. Compression Wrap: ThreePress (3 layer compression wrap) 1 x Per Week Discharge  Instructions: Apply three layer compression as directed. Wound #2 - Ankle Wound Laterality: Left, Medial Cleanser: Soap and Water 1 x Per Week Discharge Instructions: May shower and wash wound with dial antibacterial soap and water prior to dressing change. Peri-Wound Care: Sween Lotion (Moisturizing lotion) 1 x Per Week Discharge Instructions: Apply moisturizing lotion as directed Prim Dressing: Maxorb Extra Calcium Alginate, 2x2 (in/in) 1 x Per Week ary Discharge Instructions: Apply to wound bed as instructed Secondary Dressing: Zetuvit Plus 4x8 in 1 x Per Week Discharge Instructions: Apply over primary dressing as  directed. Compression Wrap: ThreePress (3 layer compression wrap) 1 x Per Week Discharge Instructions: Apply three layer compression as directed. Consults Vascular- Dr. Scot Dock  Vascular surgery - Evaluate for ablation of saphenous vein Electronic Signature(s) Signed: 11/25/2022 1:35:04 PM By: Mike Day Day FACS Entered By: Mike Day on 11/25/2022 13:21:20 Prescription 11/25/2022 -------------------------------------------------------------------------------- Mike Day Patient Name: Provider: 10/01/1945 [id number] Date of Birth: NPI#NITHILAN, FREEMON ([id number]) [id number] Page 4 of 9 Jerilynn Mages [id number] Sex: DEA #: [phone] [id number] Phone #: License #: [place] [place] Patient Address: [address], [place] [address] [place], [place] [address] [phone] Allergies levofloxacin; Neosporin (neo-bac-polym); adhesive tape Provider's Orders Vascular- Dr. Scot Dock  Vascular surgery - Evaluate for ablation of saphenous vein Hand Signature: Date(s): Electronic Signature(s) Signed: 11/25/2022 1:23:55 PM By: Mike Day Day FACS Entered By: Mike Day on 11/25/2022  13:23:55 -------------------------------------------------------------------------------- Problem List Details Patient Name: Date of Service: Mike Day, Mike Day. 11/25/2022 12:30 PM Medical Record Number: [id number] Patient Account Number: [id number] Date of Birth/Sex: Treating RN: September 17, 1945 (77 y.o. 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 11/25/2022 12:30 PM Medical Record Number: [id number] Patient Account Number: [id number] Date of Birth/Sex: Treating RN: [birth year] (77 y.o. M) Primary Care Provider: Howard Day Other Clinician: Referring Provider: Treating Provider/Extender: Mike Day, Mike Day in Treatment: 2 Subjective Chief Complaint Information obtained from Patient Patient presents for treatment of open ulcers due to venous insufficiency History of Present Illness (HPI) ADMISSION 11/11/2022 This is a 77 year old man who suffered a fall in November 2023. He  had a left hip fracture and left olecranon fracture. He had surgical treatment for both of these and was in rehabilitation for some time. As he became more active, he noted that his legs began to swell. He was seen by his primary care provider on February 16. She initiated Lasix 20 mg daily. He was also noted to have ulcers on both ankles. He was referred to the wound care center. While waiting for his appointment, he was seen by cardiology and had an updated echocardiogram performed. While he has severe left atrial enlargement, his ejection fraction is normal. He is not a diabetic. He has been applying bag balm to his ulcers. He does not wear compression stockings. On his right lateral ankle, there is a semilinear ulcer with a typical stasis ulcer appearance. There is slough and eschar accumulation. On his left medial ankle, just posterior to the malleolus, there is a small circular ulcer with slough and eschar present. No concern for infection at either site. 11/25/2022: The left medial ankle wound is healed. The right lateral ankle wound is smaller. There is  some slough and eschar accumulation present. He did have his reflux studies performed and he has significant saphenous vein reflux bilaterally. Edema control is improved. Patient History Information obtained from Patient. Family History Diabetes - Mother, Heart Disease - Father,Siblings, Hypertension - Siblings, Lung Disease - Father, Stroke - Mother. Social History Former smoker - ended on 09/06/2007, Marital Status - Married, Alcohol Use - Rarely, Drug Use - No History, Caffeine Use - Daily. Medical History Eyes Patient has history of Cataracts - bila eyes Denies history of Glaucoma, Optic Neuritis Ear/Nose/Mouth/Throat Denies history of Chronic sinus problems/congestion, Middle ear problems Hematologic/Lymphatic Patient has history of Anemia Denies history of Hemophilia, Human Immunodeficiency Virus, Lymphedema, Sickle Cell  Disease Respiratory Denies history of Aspiration, Asthma, Chronic Obstructive Pulmonary Disease (COPD), Pneumothorax, Sleep Apnea, Tuberculosis Cardiovascular Patient has history of Arrhythmia - AFIB, Hypertension Gastrointestinal Denies history of Cirrhosis , Colitis, Crohnoos, Hepatitis A, Hepatitis B, Hepatitis C Endocrine Denies history of Type I Diabetes, Type II Diabetes Genitourinary Denies history of End Stage Renal Disease Immunological Denies history of Lupus Erythematosus, Raynaudoos Integumentary (Skin) Denies history of History of Burn Musculoskeletal Denies history of Gout, Rheumatoid Arthritis, Osteoarthritis, Osteomyelitis Oncologic Denies history of Received Chemotherapy, Received Radiation Psychiatric Denies history of Anorexia/bulimia, Confinement Anxiety Objective Constitutional Slightly bradycardic. no acute distress. Mike Day, Mike Day (Mike Day) [id number] Page 6 of 9 Vitals Time Taken: 12:38 PM, Height: 75 in, Weight: 191 lbs, BMI: 23.9, Temperature: 97.3 Day, Pulse: 56 bpm, Respiratory Rate: 18 breaths/min, Blood Pressure: 134/87 mmHg. Respiratory Normal work of breathing on room air. General Notes: 11/25/2022: The left medial ankle wound is healed. The right lateral ankle wound is smaller. There is some slough and eschar accumulation present. Edema control is improved. Integumentary (Hair, Skin) Wound #1 status is Open. Original cause of wound was Blister. The date acquired was: 07/20/2022. The wound has been in treatment 2 Day. The wound is located on the Right,Lateral Ankle. The wound measures 0.8cm length x 0.3cm width x 0.2cm depth; 0.188cm^2 area and 0.038cm^3 volume. There is Fat Layer (Subcutaneous Tissue) exposed. There is no tunneling or undermining noted. There is a medium amount of serosanguineous drainage noted. There is large (67-100%) pink granulation within the wound bed. There is a small (1-33%) amount of  necrotic tissue within the wound bed including Eschar and Adherent Slough. The periwound skin appearance had no abnormalities noted for texture. The periwound skin appearance had no abnormalities noted for color. The periwound skin appearance did not exhibit: Dry/Scaly, Maceration. Periwound temperature was noted as No Abnormality. The periwound has tenderness on palpation. Wound #2 status is Open. Original cause of wound was Blister. The date acquired was: 10/30/2022. The wound has been in treatment 2 Day. The wound is located on the Left,Medial Ankle. The wound measures 0.1cm length x 0.1cm width x 0.1cm depth; 0.008cm^2 area and 0.001cm^3 volume. There is no tunneling or undermining noted. There is a none present amount of drainage noted. There is large (67-100%) granulation within the wound bed. There is no necrotic tissue within the wound bed. The periwound skin appearance had no abnormalities noted for texture. The periwound skin appearance had no abnormalities noted for color. The periwound skin appearance exhibited: Dry/Scaly. 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 There was a Selective/Open Wound Non-Viable Tissue Debridement with a total area of 0.24 sq cm performed by Mike Day, Day. With the following instrument(s): Curette to remove Non-Viable tissue/material. Material removed includes Eschar and Slough and. No specimens were taken. A time out was conducted at 13:10, prior to the start of the procedure. A Minimum amount of bleeding was controlled with Pressure. The procedure was tolerated well. Post Debridement Measurements: 0.8cm length  x 0.3cm width x 0.2cm depth; 0.038cm^3 volume. Character of Wound/Ulcer Post Debridement requires further debridement. Severity of Tissue Post Debridement is: Fat layer exposed. Post procedure Diagnosis Wound #1: Same as Pre-Procedure General Notes: Scribed for Dr. Celine Day by Mike East, RN. Pre-procedure diagnosis of Wound #1 is a Venous Leg Ulcer located on the Right,Lateral Ankle . There was a Three Layer Compression Therapy Procedure by Mike East, RN. Post procedure Diagnosis Wound #1: Same as Pre-Procedure Plan Follow-up Appointments: Return Appointment in 2 Day. - Dr. Celine Day Rm 4 Friday 11/25/22 at 10:30am Nurse Visit: - Thursday 11/17/22 at 10:00am Dressing change Anesthetic: Wound #1 Right,Lateral Ankle: (In clinic) Topical Lidocaine 5% applied to wound bed - prior to debridment Bathing/ Shower/ Hygiene: May shower with protection but do not get wound dressing(s) wet. Protect dressing(s) with water repellant cover (for example, large plastic bag) or a cast cover and may then take shower. - Please keep dressings clean and dry at all times Edema Control - Lymphedema / SCD / Other: Elevate legs to the level of the heart or above for 30 minutes daily and/or when sitting for 3-4 times a day throughout the day. Avoid standing for long periods of time. Moisturize legs daily. Consults ordered were: Vascular- Dr. Scot Dock  Vascular surgery - Evaluate for ablation of saphenous vein WOUND #1: - Ankle Wound Laterality: Right, Lateral Cleanser: Soap and Water 1 x Per Week/ Discharge Instructions: May shower and wash wound with dial antibacterial soap and water prior to dressing change. Peri-Wound Care: Sween Lotion (Moisturizing lotion) 1 x Per Week/ Discharge Instructions: Apply moisturizing lotion as directed Prim Dressing: Maxorb Extra Calcium Alginate, 2x2 (in/in) 1 x Per Week/ Mike Day, Mike Day (Mike Day) [id number] Page 7 of 9 Discharge  Instructions: Apply to wound bed as instructed Secondary Dressing: Zetuvit Plus 4x8 in 1 x Per Week/ Discharge Instructions: Apply over primary dressing as directed. Com pression Wrap: ThreePress (3 layer compression wrap) 1 x Per Week/ Discharge Instructions: Apply three layer compression as directed. WOUND #2: - Ankle Wound Laterality: Left, Medial Cleanser: Soap and Water 1 x Per Week/ Discharge Instructions: May shower and wash wound with dial antibacterial soap and water prior to dressing change. Peri-Wound Care: Sween Lotion (Moisturizing lotion) 1 x Per Week/ Discharge Instructions: Apply moisturizing lotion as directed Prim Dressing: Maxorb Extra Calcium Alginate, 2x2 (in/in) 1 x Per Week/ ary Discharge Instructions: Apply to wound bed as instructed Secondary Dressing: Zetuvit Plus 4x8 in 1 x Per Week/ Discharge Instructions: Apply over primary dressing as directed. Com pression Wrap: ThreePress (3 layer compression wrap) 1 x Per Week/ Discharge Instructions: Apply three layer compression as directed. 11/25/2022: The left medial ankle wound is healed. The right lateral ankle wound is smaller. There is some slough and eschar accumulation present. Edema control is improved. I used a curette to debride slough and eschar from the right lateral ankle wound. We will continue silver alginate and 3 layer compression. He did bring compression stockings with him today so we will help him apply that to  the left leg. We will place a referral to Dr. Scot Dock to discuss saphenous vein ablation. Follow-up in 1 week. Electronic Signature(s) Signed: 11/25/2022 1:23:22 PM By: Mike Day Day FACS Entered By: Mike Day on 11/25/2022 13:23:22 -------------------------------------------------------------------------------- HxROS Details Patient Name: Date of Service: Mike Day, Mike Day. 11/25/2022 12:30 PM Medical Record Number: Mike Day Patient Account Number: [id number] Date of Birth/Sex:  Treating RN: 07/19/1946 (77 y.o. M) Primary Care Provider: Howard Day Other Clinician: Referring Provider: Treating Provider/Extender: Mike Day Day in Treatment: 2 Information Obtained From Patient Eyes Medical History: Positive for: Cataracts - bila eyes Negative for: Glaucoma; Optic Neuritis Ear/Nose/Mouth/Throat Medical History: Negative for: Chronic sinus problems/congestion; Middle ear problems Hematologic/Lymphatic Medical History: Positive for: Anemia Negative for: Hemophilia; Human Immunodeficiency Virus; Lymphedema; Sickle Cell Disease Respiratory Medical History: Negative for: Aspiration; Asthma; Chronic Obstructive Pulmonary Disease (COPD); Pneumothorax; Sleep Apnea; Tuberculosis Cardiovascular Medical History: Positive for: Arrhythmia - AFIB; Hypertension Gastrointestinal Medical History: Negative for: Cirrhosis ; Colitis; Crohns; Hepatitis A; Hepatitis B; Hepatitis Mike Day, Mike Day (Mike Day) [id number] Page 8 of 9 Endocrine Medical History: Negative for: Type I Diabetes; Type II Diabetes Genitourinary Medical History: Negative for: End Stage Renal Disease Immunological Medical History: Negative for: Lupus Erythematosus; Raynauds Integumentary (Skin) Medical History: Negative for: History of Burn Musculoskeletal Medical History: Negative for: Gout; Rheumatoid Arthritis; Osteoarthritis; Osteomyelitis Oncologic Medical History: Negative for: Received Chemotherapy; Received Radiation Psychiatric Medical History: Negative for: Anorexia/bulimia; Confinement Anxiety HBO Extended History Items Eyes: Cataracts Immunizations Pneumococcal Vaccine: Received Pneumococcal Vaccination: Yes Received Pneumococcal Vaccination On or After 60th Birthday: Yes Implantable Devices No devices added Family and Social History Diabetes: Yes - Mother; Heart Disease: Yes - Father,Siblings; Hypertension: Yes -  Siblings; Lung Disease: Yes - Father; Stroke: Yes - Mother; Former smoker - ended on 09/06/2007; Marital Status - Married; Alcohol Use: Rarely; Drug Use: No History; Caffeine Use: Daily; Financial Concerns: No; Food, Clothing or Shelter Needs: No; Support System Lacking: No; Transportation Concerns: No Electronic Signature(s) Signed: 11/25/2022 1:35:04 PM By: Mike Day Day FACS Entered By: Mike Day on 11/25/2022 13:20:32 -------------------------------------------------------------------------------- SuperBill Details Patient Name: Date of Service: Mike Day, Mike Day. 11/25/2022 Medical Record Number: Mike Day Patient Account Number: [id number] Date of Birth/Sex: Treating RN: 03-Aug-1946 (77 y.o. 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 Facility Procedures : CPT4 Code: [id number] Description: [id number] - DEBRIDE WOUND 1ST 20 SQ CM OR < ICD-10 Diagnosis Description [id number] Non-pressure chronic ulcer of right ankle with fat layer exposed Modifier: Quantity: 1 Physician Procedures : CPT4 Code Description Modifier [id number] - WC PHYS LEVEL 4 - EST PT 25 ICD-10 Diagnosis Description [id number] Non-pressure chronic ulcer of right ankle with fat layer exposed I83.003 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

## 2022-11-30 ENCOUNTER — Encounter: Payer: Medicare Other | Admitting: Vascular Surgery

## 2022-12-01 ENCOUNTER — Encounter (HOSPITAL_BASED_OUTPATIENT_CLINIC_OR_DEPARTMENT_OTHER): Payer: Medicare Other | Admitting: General Surgery

## 2022-12-01 DIAGNOSIS — R600 Localized edema: Secondary | ICD-10-CM | POA: Diagnosis not present

## 2022-12-01 DIAGNOSIS — I4821 Permanent atrial fibrillation: Secondary | ICD-10-CM | POA: Diagnosis not present

## 2022-12-01 DIAGNOSIS — I872 Venous insufficiency (chronic) (peripheral): Secondary | ICD-10-CM | POA: Diagnosis not present

## 2022-12-01 DIAGNOSIS — L97322 Non-pressure chronic ulcer of left ankle with fat layer exposed: Secondary | ICD-10-CM | POA: Diagnosis not present

## 2022-12-01 DIAGNOSIS — L97312 Non-pressure chronic ulcer of right ankle with fat layer exposed: Secondary | ICD-10-CM | POA: Diagnosis not present

## 2022-12-01 DIAGNOSIS — Z9889 Other specified postprocedural states: Secondary | ICD-10-CM | POA: Diagnosis not present

## 2022-12-01 DIAGNOSIS — I83003 Varicose veins of unspecified lower extremity with ulcer of ankle: Secondary | ICD-10-CM | POA: Diagnosis not present

## 2022-12-01 NOTE — Progress Notes (Addendum)
JAHMARLEY, CUTRER ([id number]) [id number] Page 1 of 8 Visit Report for 12/01/2022 Chief Complaint Document Details Patient Name: Date of Service: Evelena Asa CK F. 12/01/2022 12:30 PM Medical Record Number: [id number] Patient Account Number: [id number] Date of Birth/Sex: Treating RN: [birth year] (77 y.o. M) Primary Care Provider: Howard Pouch Other Clinician: Referring Provider: Treating Provider/Extender: Reginia Naas, Renee Weeks in Treatment: 2 Information Obtained from: Patient Chief Complaint Patient presents for treatment of open ulcers due to venous insufficiency Electronic Signature(s) Signed: 12/01/2022 1:07:57 PM By: Fredirick Maudlin MD FACS Entered By: Fredirick Maudlin on 12/01/2022 13:07:57 -------------------------------------------------------------------------------- Debridement Details Patient Name: Date of Service: [name], [name] 12/01/2022 12:30 PM Medical Record Number: [id number] Patient Account Number: [id number] Date of Birth/Sex: Treating RN: 1945/11/21 (77 y.o. Ernestene Mention Primary Care Provider: Howard Pouch Other Clinician: Referring Provider: Treating Provider/Extender: Volney Presser Weeks in Treatment: 2 Debridement Performed for Assessment: Wound #1 Right,Lateral Ankle Performed By: Physician Fredirick Maudlin, MD Debridement Type: Debridement Severity of Tissue Pre Debridement: Fat layer exposed Level of Consciousness (Pre-procedure): Awake and Alert Pre-procedure Verification/Time Out Yes - 13:00 Taken: Start Time: 13:01 Pain Control: Lidocaine 4% T opical Solution T Area Debrided (L x W): otal 0.5 (cm) x 0.8 (cm) = 0.4 (cm) Tissue and other material debrided: Non-Viable, Eschar, Slough, Slough Level: Non-Viable Tissue Debridement Description: Selective/Open Wound Instrument: Curette Bleeding: Minimum Hemostasis Achieved: Pressure Procedural Pain: 3 Post Procedural Pain:  1 Response to Treatment: Procedure was tolerated well Level of Consciousness (Post- Awake and Alert procedure): Post Debridement Measurements of Total Wound Length: (cm) 0.5 Width: (cm) 0.8 Depth: (cm) 0.2 Volume: (cm) 0.063 Character of Wound/Ulcer Post Debridement: Improved Severity of Tissue Post Debridement: Fat layer exposed Post Procedure Diagnosis Same as Pre-procedure Notes Scribed for Dr. Celine Ahr by Baruch Gouty, RN Electronic Signature(s) Signed: 12/01/2022 1:15:31 PM By: Fredirick Maudlin MD FACS [name] ([id number]) [id number] Page 2 of 8 Signed: 12/01/2022 3:57:42 PM By: Baruch Gouty RN, BSN Entered By: Baruch Gouty on 12/01/2022 13:03:34 -------------------------------------------------------------------------------- HPI Details Patient Name: Date of Service: [name], [name] 12/01/2022 12:30 PM Medical Record Number: [id number] Patient Account Number: [id number] Date of Birth/Sex: Treating RN: 10-21-1945 (77 y.o. M) Primary Care Provider: Howard Pouch Other Clinician: Referring Provider: Treating Provider/Extender: Reginia Naas, Renee Weeks in Treatment: 2 History of Present Illness HPI Description: ADMISSION 11/11/2022 This is a 77 year old man who suffered a fall in November 2023. He had a left hip fracture and left olecranon fracture. He had surgical treatment for both of these and was in rehabilitation for some time. As he became more active, he noted that his legs began to swell. He was seen by his primary care provider on February 16. She initiated Lasix 20 mg daily. He was also noted to have ulcers on both ankles. He was referred to the wound care center. While waiting for his appointment, he was seen by cardiology and had an updated echocardiogram performed. While he has severe left atrial enlargement, his ejection fraction is normal. He is not a diabetic. He has been applying bag balm to his ulcers. He  does not wear compression stockings. On his right lateral ankle, there is a semilinear ulcer with a typical stasis ulcer appearance. There is slough and eschar accumulation. On his left medial ankle, just posterior to the malleolus, there is a small circular ulcer with slough and eschar present. No concern for infection at either site. 11/25/2022: The  left medial ankle wound is healed. The right lateral ankle wound is smaller. There is some slough and eschar accumulation present. He did have his reflux studies performed and he has significant saphenous vein reflux bilaterally. Edema control is improved. 12/01/2022: The right lateral leg wound is about the same size. Again with slough and eschar accumulation. He has an appointment on April 4 with vascular surgery to discuss saphenous vein ablation. Electronic Signature(s) Signed: 12/01/2022 1:08:27 PM By: Fredirick Maudlin MD FACS Entered By: Fredirick Maudlin on 12/01/2022 13:08:27 -------------------------------------------------------------------------------- Physical Exam Details Patient Name: Date of Service: [name], [name] 12/01/2022 12:30 PM Medical Record Number: [id number] Patient Account Number: [id number] Date of Birth/Sex: Treating RN: 08/07/1946 (77 y.o. M) Primary Care Provider: Howard Pouch Other Clinician: Referring Provider: Treating Provider/Extender: Reginia Naas, Renee Weeks in Treatment: 2 Constitutional . . . . no acute distress. Respiratory Normal work of breathing on room air. Notes 12/01/2022: The right lateral leg wound is about the same size. Again with slough and eschar accumulation. Electronic Signature(s) Signed: 12/01/2022 1:08:54 PM By: Fredirick Maudlin MD FACS Entered By: Fredirick Maudlin on 12/01/2022 13:08:54 -------------------------------------------------------------------------------- Physician Orders Details Patient Name: Date of Service: [name], [name] 12/01/2022 12:30 PM Medical  Record Number: [id number] Patient Account Number: [id number] Date of Birth/Sex: Treating RN: [birth year] (77 y.o. 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 - Dr. Celine Ahr RM 1 Return A Thursday 4/4 @ 1:15 pm Anesthetic Wound #1 Right,Lateral Ankle (In clinic) Topical Lidocaine 5% applied to wound bed - prior to debridment Bathing/ Shower/ Hygiene May shower with protection but do not get wound dressing(s) wet. Protect dressing(s) with water repellant cover (for example, large plastic bag) or a cast cover and may then take shower. - Please keep dressings clean and dry at all times Edema Control - Lymphedema / SCD / Other Elevate legs to the level of the heart or above for 30 minutes daily and/or when sitting for 3-4 times a day throughout the day. Avoid standing for long periods of time. Moisturize legs daily. Wound Treatment Wound #1 - Ankle Wound Laterality: Right, Lateral Cleanser: Soap and Water 1 x Per Week Discharge Instructions: May shower and wash wound with dial antibacterial soap and water prior to dressing change. Peri-Wound Care: Triamcinolone 15 (g) 1 x Per Week Discharge Instructions: Use triamcinolone 15 (g) as directed Peri-Wound Care: Sween Lotion (Moisturizing lotion) 1 x Per Week Discharge Instructions: Apply moisturizing lotion as directed Prim Dressing: Maxorb Extra Ag+ Alginate Dressing, 2x2 (in/in) 1 x Per Week ary Discharge  Instructions: Apply to wound bed as instructed Secondary Dressing: Woven Gauze Sponge, Non-Sterile 4x4 in 1 x Per Week Discharge Instructions: Apply over primary dressing as directed. Compression Wrap: ThreePress (3 layer compression wrap) 1 x Per Week Discharge Instructions: Apply three layer compression as directed. Electronic Signature(s) Signed: 12/01/2022 1:15:31 PM By: Fredirick Maudlin MD FACS Signed: 12/01/2022 3:57:42 PM By: Baruch Gouty RN, BSN Previous Signature: 12/01/2022 1:09:02 PM Version By: Fredirick Maudlin MD FACS Entered By: Baruch Gouty on 12/01/2022 13:10:09 -------------------------------------------------------------------------------- Problem List Details  Patient Name: Date of Service: Evelena Asa CK F. 12/01/2022 12:30 PM Medical Record Number: [id number] Patient Account Number: [id number] Date of Birth/Sex: Treating RN: 1946/08/02 (77 y.o. 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 12/01/2022 12:30 PM Medical Record Number: [id number] Patient Account Number: [id number] Date of Birth/Sex: Treating RN: 23-Oct-1945 (77 y.o. M) Primary Care Provider: Howard Pouch Other Clinician: Referring Provider: Treating Provider/Extender: Reginia Naas, Renee Weeks in Treatment: 2 Subjective Chief Complaint Information obtained from Patient Patient presents for treatment of open ulcers due to venous insufficiency History of Present Illness (HPI) ADMISSION 11/11/2022 This is a 77 year old man who suffered a fall in November 2023. He had a left hip fracture and left olecranon fracture. He had surgical treatment for both of these and was in rehabilitation for some time. As he became more active, he noted that his legs began to swell. He was seen by his primary care provider on February 16. She initiated Lasix 20 mg daily. He was also noted to have ulcers on both ankles. He was referred to the wound care center. While waiting for his appointment, he was seen by cardiology and had an updated echocardiogram performed. While he has severe left atrial enlargement, his ejection fraction is normal. He is not a diabetic. He has been applying bag balm to his ulcers. He does not wear compression stockings. On his right lateral ankle, there is a semilinear ulcer with a typical stasis ulcer appearance. There is slough and eschar accumulation. On his left medial ankle, just posterior to the malleolus, there is a small circular ulcer with slough and eschar present. No concern for infection at either site. 11/25/2022: The left medial ankle wound is healed. The right lateral ankle wound is smaller. There is some slough and eschar accumulation present. He did have his reflux studies performed and he has significant saphenous vein reflux bilaterally. Edema control is improved. 12/01/2022: The right lateral leg wound is about the same size. Again with slough and  eschar accumulation. He has an appointment on April 4 with vascular surgery to discuss saphenous vein ablation. Patient History Information obtained from Patient. Family History Diabetes - Mother, Heart Disease - Father,Siblings, Hypertension - Siblings, Lung Disease - Father, Stroke - Mother. Social History Former smoker - ended on 09/06/2007, Marital Status - Married, Alcohol Use - Rarely, Drug Use - No History, Caffeine Use - Daily. Medical History Eyes Patient has history of Cataracts - bila eyes Denies history of Glaucoma, Optic Neuritis VINSON, SIMONETTI ([id number]) [id number] Page 5 of 8 Ear/Nose/Mouth/Throat Denies history of Chronic sinus problems/congestion, Middle ear problems Hematologic/Lymphatic Patient has history of Anemia Denies history of Hemophilia, Human Immunodeficiency Virus, Lymphedema, Sickle Cell Disease Respiratory Denies history  of Aspiration, Asthma, Chronic Obstructive Pulmonary Disease (COPD), Pneumothorax, Sleep Apnea, Tuberculosis Cardiovascular Patient has history of Arrhythmia - AFIB, Hypertension Gastrointestinal Denies history of Cirrhosis , Colitis, Crohnoos, Hepatitis A, Hepatitis B, Hepatitis C Endocrine Denies history of Type I Diabetes, Type II Diabetes Genitourinary Denies history of End Stage Renal Disease Immunological Denies history of Lupus Erythematosus, Raynaudoos Integumentary (Skin) Denies history of History of Burn Musculoskeletal Denies history of Gout, Rheumatoid Arthritis, Osteoarthritis, Osteomyelitis Oncologic Denies history of Received Chemotherapy, Received Radiation Psychiatric Denies history of Anorexia/bulimia, Confinement Anxiety Objective Constitutional no acute distress. Vitals Time Taken: 12:44 PM, Height: 75 in, Weight: 191 lbs, BMI: 23.9, Temperature: 98.1 F, Pulse: 70 bpm, Respiratory Rate: 18 breaths/min, Blood Pressure: 135/82 mmHg. Respiratory Normal work of breathing on  room air. General Notes: 12/01/2022: The right lateral leg wound is about the same size. Again with slough and eschar accumulation. Integumentary (Hair, Skin) Wound #1 status is Open. Original cause of wound was Blister. The date acquired was: 07/20/2022. The wound has been in treatment 2 weeks. The wound is located on the Right,Lateral Ankle. The wound measures 0.5cm length x 0.8cm width x 0.2cm depth; 0.314cm^2 area and 0.063cm^3 volume. There is Fat Layer (Subcutaneous Tissue) exposed. There is no tunneling or undermining noted. There is a small amount of serosanguineous drainage noted. The wound margin is distinct with the outline attached to the wound base. There is large (67-100%) pink granulation within the wound bed. There is a small (1-33%) amount of necrotic tissue within the wound bed including Eschar and Adherent Slough. The periwound skin appearance had no abnormalities noted for texture. The periwound skin appearance had no abnormalities noted for color. The periwound skin appearance did not exhibit: Dry/Scaly, Maceration. Periwound temperature was noted as No Abnormality. The periwound has tenderness on palpation. 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 There was a Selective/Open Wound Non-Viable Tissue Debridement with a total area of 0.4 sq cm performed by Fredirick Maudlin, MD. With the following instrument(s): Curette to remove Non-Viable tissue/material. Material removed includes Eschar and Slough and after achieving pain control using Lidocaine 4% Topical Solution. No specimens were taken. A time out was conducted at 13:00, prior to the  start of the procedure. A Minimum amount of bleeding was controlled with Pressure. The procedure was tolerated well with a pain level of 3 throughout and a pain level of 1 following the procedure. Post Debridement Measurements: 0.5cm length x 0.8cm width x 0.2cm depth; 0.063cm^3 volume. Character of Wound/Ulcer Post Debridement is improved. Severity of Tissue Post Debridement is: Fat layer exposed. Post procedure Diagnosis Wound #1: Same as Pre-Procedure TOLULOPE, LUCKY ([id number]) [id number] Page 6 of 8 General Notes: Scribed for Dr. Celine Ahr by Baruch Gouty, RN. Pre-procedure diagnosis of Wound #1 is a Venous Leg Ulcer located on the Right,Lateral Ankle . There was a Double Layer Compression Therapy Procedure by Baruch Gouty, RN. Post procedure Diagnosis Wound #1: Same as Pre-Procedure Plan Follow-up Appointments: Return Appointment in 1 week. - Dr. Celine Ahr RM 1 Thursday 4/4 @ 1:15 pm Anesthetic: Wound #1 Right,Lateral Ankle: (In clinic) Topical Lidocaine 5% applied to wound bed - prior to debridment Bathing/ Shower/ Hygiene: May shower with protection but do not get wound  dressing(s) wet. Protect dressing(s) with water repellant cover (for example, large plastic bag) or a cast cover and may then take shower. - Please keep dressings clean and dry at all times Edema Control - Lymphedema / SCD / Other: Elevate legs to the level of the heart or above for 30 minutes daily and/or when sitting for 3-4 times a day throughout the day. Avoid standing for long periods of time. Moisturize legs daily. WOUND #1: - Ankle Wound Laterality: Right, Lateral Cleanser: Soap and Water 1 x Per Week/ Discharge Instructions: May shower and wash wound with dial antibacterial soap and water prior to dressing change. Peri-Wound Care: Triamcinolone 15 (g) 1 x Per Week/ Discharge Instructions: Use triamcinolone 15 (g) as directed Peri-Wound Care: Sween Lotion (Moisturizing lotion) 1 x  Per Week/ Discharge Instructions: Apply moisturizing lotion as directed Prim Dressing: Maxorb Extra Ag+ Alginate Dressing, 2x2 (in/in) 1 x Per Week/ ary Discharge Instructions: Apply to wound bed as instructed Secondary Dressing: Woven Gauze Sponge, Non-Sterile 4x4 in 1 x Per Week/ Discharge Instructions: Apply over primary dressing as directed. Com pression Wrap: ThreePress (3 layer compression wrap) 1 x Per Week/ Discharge Instructions: Apply three layer compression as directed. 12/01/2022: The right lateral leg wound is about the same size. Again with slough and eschar accumulation. I used a curette to debride slough and eschar from the wound. Will continue silver alginate and 3 layer compression. His leg did show a little bit of irritation from the wrap so we will add some triamcinolone acetate lotion to try and soothe this. He is wearing a good compression stocking on his left leg. Follow-up in 1 week. Electronic Signature(s) Signed: 12/05/2022 7:39:36 AM By: Fredirick Maudlin MD FACS Previous Signature: 12/01/2022 1:09:49 PM Version By: Fredirick Maudlin MD FACS Entered By: Fredirick Maudlin on 12/05/2022 07:39:36 -------------------------------------------------------------------------------- HxROS Details Patient Name: Date of Service: [name], [name] 12/01/2022 12:30 PM Medical Record Number: [id number] Patient Account Number: [id number] Date of Birth/Sex: Treating RN: 09-Jul-1946 (77 y.o. M) Primary Care Provider: Howard Pouch Other Clinician: Referring Provider: Treating Provider/Extender: Volney Presser Weeks in Treatment: 2 Information Obtained From Patient Eyes Medical History: Positive for: Cataracts - bila eyes Negative for: Glaucoma; Optic Neuritis Ear/Nose/Mouth/Throat Medical History: Negative for: Chronic sinus problems/congestion; Middle ear problems Hematologic/Lymphatic PIYUSH, SALAIZ ([id number]) [id number] Page 7  of 8 Medical History: Positive for: Anemia Negative for: Hemophilia; Human Immunodeficiency Virus; Lymphedema; Sickle Cell Disease Respiratory Medical History: Negative for: Aspiration; Asthma; Chronic Obstructive Pulmonary Disease (COPD); Pneumothorax; Sleep Apnea; Tuberculosis Cardiovascular Medical History: Positive for: Arrhythmia - AFIB; Hypertension Gastrointestinal Medical History: Negative for: Cirrhosis ; Colitis; Crohns; Hepatitis A; Hepatitis B; Hepatitis C Endocrine Medical History: Negative for: Type I Diabetes; Type II Diabetes Genitourinary Medical History: Negative for: End Stage Renal Disease Immunological Medical History: Negative for: Lupus Erythematosus; Raynauds Integumentary (Skin) Medical History: Negative for: History of Burn Musculoskeletal Medical History: Negative for: Gout; Rheumatoid Arthritis; Osteoarthritis; Osteomyelitis Oncologic Medical History: Negative for: Received Chemotherapy; Received Radiation Psychiatric Medical History: Negative for: Anorexia/bulimia; Confinement Anxiety HBO Extended History Items Eyes: Cataracts Immunizations Pneumococcal Vaccine: Received Pneumococcal Vaccination: Yes Received Pneumococcal Vaccination On or After 60th Birthday: Yes Implantable Devices No devices added Family and Social History Diabetes: Yes - Mother; Heart Disease: Yes - Father,Siblings; Hypertension: Yes - Siblings; Lung Disease: Yes - Father; Stroke: Yes - Mother; Former smoker - ended on 09/06/2007; Marital Status - Married; Alcohol Use: Rarely; Drug Use: No History; Caffeine Use: Daily; Financial Concerns: No;  Food, Games developer or Shelter Needs: No; Support System Lacking: No; Transportation Concerns: No Engineer, maintenance) Signed: 12/01/2022 1:15:31 PM By: Fredirick Maudlin MD FACS Entered By: Fredirick Maudlin on 12/01/2022 13:08:33 [name] ([id number]) [id number] Page 8 of  8 -------------------------------------------------------------------------------- SuperBill Details Patient Name: Date of Service: [name]. 12/01/2022 Medical Record Number: [id number] Patient Account Number: [id number] Date of Birth/Sex: Treating RN: Oct 07, 1945 (77 y.o. 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 Facility Procedures : CPT4 Code: [id number] Description: [id number] - DEBRIDE WOUND 1ST 20 SQ CM OR < ICD-10 Diagnosis Description L97.312 Non-pressure chronic ulcer of right ankle with fat layer exposed Modifier: Quantity: 1 Physician Procedures : CPT4 Code Description Modifier [id number] - WC PHYS LEVEL 3 - EST PT 25 ICD-10 Diagnosis Description L97.312 Non-pressure chronic ulcer of right ankle with fat layer exposed R60.0 Localized edema I83.003 Varicose veins of unspecified lower extremity  with ulcer of ankle I48.21 Permanent atrial fibrillation Quantity: 1 : [id number] - WC PHYS DEBR WO ANESTH 20 SQ CM ICD-10 Diagnosis Description L97.312 Non-pressure chronic ulcer of right ankle with fat layer exposed Quantity: 1 Electronic Signature(s) Signed: 12/01/2022 1:10:12 PM By: Fredirick Maudlin MD FACS Entered By: Fredirick Maudlin on 12/01/2022 13:10:12

## 2022-12-01 NOTE — Progress Notes (Signed)
Mike, Day ([id number]) [id number] Page 1 of 7 Visit Report for 12/01/2022 Arrival Information Details Patient Name: Date of Service: Mike Day CK F. 12/01/2022 12:30 PM Medical Record Number: [id number] Patient Account Number: [id number] Date of Birth/Sex: Treating RN: December 20, 1945 (77 y.o. Ernestene Mention Primary Care Maeci Kalbfleisch: Howard Pouch Other Clinician: Referring Ahmon Tosi: Treating Maize Brittingham/Extender: Volney Presser Weeks in Treatment: 2 Visit Information History Since Last Visit Added or deleted any medications: No Patient Arrived: Ambulatory Any new allergies or adverse reactions: No Arrival Time: 12:40 Had a fall or experienced change in No Accompanied By: spouse activities of daily living that may affect Transfer Assistance: None risk of falls: Patient Identification Verified: Yes Signs or symptoms of abuse/neglect since last visito No Secondary Verification Process Completed: Yes Hospitalized since last visit: No Patient Requires Transmission-Based Precautions: No Implantable device outside of the clinic excluding No Patient Has Alerts: Yes cellular tissue based products placed in the center Patient Alerts: Patient on Blood Thinner since last visit: Has Dressing in Place as Prescribed: Yes Has Compression in Place as Prescribed: Yes Pain Present Now: No Electronic Signature(s) Signed: 12/01/2022 3:57:42 PM By: Baruch Gouty RN, BSN Entered By: Baruch Gouty on 12/01/2022 12:44:32 -------------------------------------------------------------------------------- Compression Therapy Details Patient Name: Date of Service: Mike Day, [name] 12/01/2022 12:30 PM Medical Record Number: [id number] Patient Account Number: [id number] Date of Birth/Sex: Treating RN: Oct 07, 1945 (77 y.o. Ernestene Mention Primary Care Marv Alfrey: Howard Pouch Other Clinician: Referring Kalenna Millett: Treating Braelon Sprung/Extender: Volney Presser Weeks in Treatment: 2 Compression Therapy Performed for Wound Assessment: Wound #1 Right,Lateral Ankle Performed By: Clinician Baruch Gouty, RN Compression Type: Double Layer Post Procedure Diagnosis Same as Pre-procedure Electronic Signature(s) Signed: 12/01/2022 3:57:42 PM By: Baruch Gouty RN, BSN Entered By: Baruch Gouty on 12/01/2022 13:01:04 -------------------------------------------------------------------------------- Encounter Discharge Information Details Patient Name: Date of Service: Mike Day, [name] 12/01/2022 12:30 PM Medical Record Number: [id number] Patient Account Number: [id number] Date of Birth/Sex: Treating RN: 1946-04-06 (77 y.o. Ernestene Mention Primary Care Maclean Foister: Howard Pouch Other Clinician: Referring Jarrette Dehner: Treating Shenita Trego/Extender: Volney Presser Weeks in Treatment: 2 Encounter Discharge Information Items Post Procedure Vitals Discharge Condition: Stable Temperature (F): 98.1 Ambulatory Status: Ambulatory Pulse (bpm): 70 Discharge Destination: Home Respiratory Rate (breaths/min): 18 Transportation: Private Auto Blood Pressure (mmHg): 135/82 DOYL, PELLEGRINI ([id number]) [id number] Page 2 of 7 Accompanied By: spouse Schedule Follow-up Appointment: Yes Clinical Summary of Care: Patient Declined Electronic Signature(s) Signed: 12/01/2022 3:57:42 PM By: Baruch Gouty RN, BSN Entered By: Baruch Gouty on 12/01/2022 13:21:31 -------------------------------------------------------------------------------- Lower Extremity Assessment Details Patient Name: Date of Service: GO Mike Day, [name] 12/01/2022 12:30 PM Medical Record Number: [id number] Patient Account Number: [id number] Date of Birth/Sex: Treating RN: [birth year] (77 y.o. Ernestene Mention Primary Care Dynastie Knoop: Howard Pouch Other Clinician: Referring Debbe Crumble: Treating Brace Welte/Extender: Reginia Naas, Renee Weeks in Treatment: 2 Edema Assessment Assessed: [Left: No] [Right: No] Edema: [Left: N] [Right: o] Calf Left: Right: Point of Measurement: From Medial Instep 34 cm Ankle Left: Right: Point of Measurement: From Medial Instep 22 cm Vascular Assessment Pulses: Dorsalis Pedis Palpable: [Right:Yes] Electronic Signature(s) Signed: 12/01/2022 3:57:42 PM By: Baruch Gouty RN, BSN Entered By: Baruch Gouty on 12/01/2022 12:51:03 -------------------------------------------------------------------------------- Multi Wound Chart Details Patient Name: Date of Service: Mike Day, [name] 12/01/2022 12:30 PM Medical Record Number: [id number] Patient Account Number: [id number] Date of Birth/Sex: Treating RN: 1946/02/03 (77 y.o. M) Primary  Care Lovelyn Sheeran: Howard Pouch Other Clinician: Referring Rozalia Dino: Treating Kristoffer Bala/Extender: Reginia Naas, Renee Weeks in Treatment: 2 Vital Signs Height(in): 75 Pulse(bpm): 70 Weight(lbs): 191 Blood Pressure(mmHg): 135/82 Body Mass Index(BMI): 23.9 Temperature(F): 98.1 Respiratory Rate(breaths/min): 18 [1:Photos:] [N/A:N/A] Right, Lateral Ankle N/A N/A Wound Location: Blister N/A N/A Wounding Event: Venous Leg Ulcer N/A N/A Primary Etiology: Cataracts, Anemia, Arrhythmia, N/A N/A Comorbid History: Hypertension 07/20/2022 N/A N/A Date Acquired: 2 N/A N/A Weeks of Treatment: Open N/A N/A Wound Status: No N/A N/A Wound Recurrence: 0.5x0.8x0.2 N/A N/A Measurements L x W x D (cm) 0.314 N/A N/A A (cm) : rea 0.063 N/A N/A Volume (cm) : 86.00% N/A N/A % Reduction in A rea: 71.90% N/A N/A % Reduction in Volume: Full Thickness Without Exposed N/A N/A Classification: Support Structures Small N/A N/A Exudate A mount: Serosanguineous N/A N/A Exudate Type: red, brown N/A N/A Exudate Color: Distinct, outline attached N/A N/A Wound Margin: Large (67-100%) N/A N/A Granulation A mount: Pink N/A  N/A Granulation Quality: Small (1-33%) N/A N/A Necrotic A mount: Eschar, Adherent Slough N/A N/A Necrotic Tissue: Fat Layer (Subcutaneous Tissue): Yes N/A N/A Exposed Structures: Fascia: No Tendon: No Muscle: No Joint: No Bone: No Small (1-33%) N/A N/A Epithelialization: Debridement - Selective/Open Wound N/A N/A Debridement: Pre-procedure Verification/Time Out 13:00 N/A N/A Taken: Lidocaine 4% Topical Solution N/A N/A Pain Control: Necrotic/Eschar, Slough N/A N/A Tissue Debrided: Non-Viable Tissue N/A N/A Level: 0.4 N/A N/A Debridement A (sq cm): rea Curette N/A N/A Instrument: Minimum N/A N/A Bleeding: Pressure N/A N/A Hemostasis A chieved: 3 N/A N/A Procedural Pain: 1 N/A N/A Post Procedural Pain: Procedure was tolerated well N/A N/A Debridement Treatment Response: 0.5x0.8x0.2 N/A N/A Post Debridement Measurements L x W x D (cm) 0.063 N/A N/A Post Debridement Volume: (cm) No Abnormalities Noted N/A N/A Periwound Skin Texture: Maceration: No N/A N/A Periwound Skin Moisture: Dry/Scaly: No No Abnormalities Noted N/A N/A Periwound Skin Color: No Abnormality N/A N/A Temperature: Yes N/A N/A Tenderness on Palpation: Compression Therapy N/A N/A Procedures Performed: Debridement Treatment Notes Electronic Signature(s) Signed: 12/01/2022 1:07:52 PM By: Fredirick Maudlin MD FACS Entered By: Fredirick Maudlin on 12/01/2022 13:07:51 -------------------------------------------------------------------------------- Multi-Disciplinary Care Plan Details Patient Name: Date of Service: Mike Day, [name] 12/01/2022 12:30 PM Medical Record Number: [id number] Patient Account Number: [id number] Date of Birth/Sex: Treating RN: [birth year] (77 y.o. Ernestene Mention Primary Care Cylee Dattilo: Howard Pouch Other Clinician: Referring Tredarius Cobern: Treating Kayden Amend/Extender: Dorris Carnes in Treatment: 2 Multidisciplinary Care Plan reviewed with  physician [address] BENICIO, LARRANAGA ([id number]) [id number] Page 4 of 7 Nutrition Nursing Diagnoses: Potential for alteratiion in Nutrition/Potential for imbalanced nutrition Goals: Patient/caregiver agrees to and verbalizes understanding of need to use nutritional supplements and/or vitamins as prescribed Date Initiated: 11/11/2022 Target Resolution Date: 01/03/2023 Goal Status: Active Interventions: Assess patient nutrition upon admission and as needed per policy Treatment Activities: Dietary management education, guidance and counseling : 11/11/2022 Notes: Venous Leg Ulcer Nursing Diagnoses: Potential for venous Insuffiency (use before diagnosis confirmed) Goals: Patient will maintain optimal edema control Date Initiated: 11/11/2022 Target Resolution Date: 01/03/2023 Goal Status: Active Interventions: Compression as ordered Provide education on venous insufficiency Treatment Activities: Therapeutic compression applied : 11/11/2022 Notes: Wound/Skin Impairment Nursing Diagnoses: Impaired tissue integrity Goals: Ulcer/skin breakdown will have a volume reduction of 30% by week 4 Date Initiated: 11/11/2022 Target Resolution Date: 01/03/2023 Goal Status: Active Interventions: Assess ulceration(s) every visit Provide education on smoking Treatment Activities: Skin care regimen initiated : 11/11/2022 Notes: Electronic Signature(s)  Signed: 12/01/2022 3:57:42 PM By: Baruch Gouty RN, BSN Entered By: Baruch Gouty on 12/01/2022 12:56:09 -------------------------------------------------------------------------------- Pain Assessment Details Patient Name: Date of Service: Mike Day, [name] 12/01/2022 12:30 PM Medical Record Number: [id number] Patient Account Number: [id number] Date of Birth/Sex: Treating RN: Dec 10, 1945 (77 y.o. Ernestene Mention Primary Care Lakishia Bourassa: Howard Pouch Other Clinician: Referring Kayin Osment: Treating Terrius Gentile/Extender:  Volney Presser Weeks in Treatment: 2 Active Problems Location of Pain Severity and Description of Pain Patient Has Paino No JETSON, FUDALA ([id number]) [phone].pdf Page 5 of 7 Patient Has Paino No Site Locations Rate the pain. Current Pain Level: 0 Pain Management and Medication Current Pain Management: Electronic Signature(s) Signed: 12/01/2022 3:57:42 PM By: Baruch Gouty RN, BSN Entered By: Baruch Gouty on 12/01/2022 12:45:08 -------------------------------------------------------------------------------- Patient/Caregiver Education Details Patient Name: Date of Service: Mike Day, [name] 3/28/2024andnbsp12:30 PM Medical Record Number: [id number] Patient Account Number: [id number] Date of Birth/Gender: Treating RN: 03-17-1946 (77 y.o. Ernestene Mention Primary Care Physician: Howard Pouch Other Clinician: Referring Physician: Treating Physician/Extender: Dorris Carnes in Treatment: 2 Education Assessment Education Provided To: Patient Education Topics Provided Venous: Methods: Explain/Verbal Responses: Reinforcements needed, State content correctly Electronic Signature(s) Signed: 12/01/2022 3:57:42 PM By: Baruch Gouty RN, BSN Entered By: Baruch Gouty on 12/01/2022 12:56:23 -------------------------------------------------------------------------------- Wound Assessment Details Patient Name: Date of Service: Mike Day, [name] 12/01/2022 12:30 PM Medical Record Number: [id number] Patient Account Number: [id number] Date of Birth/Sex: Treating RN: [birth year] (77 y.o. Ernestene Mention Primary Care Akeema Broder: Howard Pouch Other Clinician: Referring Leighanne Adolph: Treating Rodgers Likes/Extender: Reginia Naas, Renee Weeks in Treatment: 2 Wound Status Wound Number: 1 Primary Etiology: Venous Leg Ulcer Wound Location: Right, Lateral Ankle Wound Status: Open Wounding Event: Blister Comorbid  History: Cataracts, Anemia, Arrhythmia, Hypertension Date Acquired: 07/20/2022 [name] ([id number]) [phone].pdf Page 6 of 7 Weeks Of Treatment: 2 Clustered Wound: No Photos Wound Measurements Length: (cm) 0.5 Width: (cm) 0.8 Depth: (cm) 0.2 Area: (cm) 0.314 Volume: (cm) 0.063 % Reduction in Area: 86% % Reduction in Volume: 71.9% Epithelialization: Small (1-33%) Tunneling: No Undermining: No Wound Description Classification: Full Thickness Without Exposed Su Wound Margin: Distinct, outline attached Exudate Amount: Small Exudate Type: Serosanguineous Exudate Color: red, brown pport Structures Wound Bed Granulation Amount: Large (67-100%) Exposed Structure Granulation Quality: Pink Fascia Exposed: No Necrotic Amount: Small (1-33%) Fat Layer (Subcutaneous Tissue) Exposed: Yes Necrotic Quality: Eschar, Adherent Slough Tendon Exposed: No Muscle Exposed: No Joint Exposed: No Bone Exposed: No Periwound Skin Texture Texture Color No Abnormalities Noted: Yes No Abnormalities Noted: Yes Moisture Temperature / Pain No Abnormalities Noted: No Temperature: No Abnormality Dry / Scaly: No Tenderness on Palpation: Yes Maceration: No Treatment Notes Wound #1 (Ankle) Wound Laterality: Right, Lateral Cleanser Soap and Water Discharge Instruction: May shower and wash wound with dial antibacterial soap and water prior to dressing change. Peri-Wound Care Triamcinolone 15 (g) Discharge Instruction: Use triamcinolone 15 (g) as directed Sween Lotion (Moisturizing lotion) Discharge Instruction: Apply moisturizing lotion as directed Topical Primary Dressing Maxorb Extra Ag+ Alginate Dressing, 2x2 (in/in) Discharge Instruction: Apply to wound bed as instructed Secondary Dressing Woven Gauze Sponge, Non-Sterile 4x4 in Discharge Instruction: Apply over primary dressing as directed. Secured With [name] ([id number])  [id number] Page 7 of 7 Compression Wrap ThreePress (3 layer compression wrap) Discharge Instruction: Apply three layer compression as directed. Compression Stockings Add-Ons Electronic Signature(s) Signed: 12/01/2022 3:57:42 PM By: Baruch Gouty RN, BSN Entered By: Baruch Gouty on  12/01/2022 12:54:16 -------------------------------------------------------------------------------- Vitals Details Patient Name: Date of Service: Mike Day CK F. 12/01/2022 12:30 PM Medical Record Number: [id number] Patient Account Number: [id number] Date of Birth/Sex: Treating RN: [birth year] (77 y.o. Ernestene Mention Primary Care Darianne Muralles: Howard Pouch Other Clinician: Referring Kue Fox: Treating Toni Hoffmeister/Extender: Reginia Naas, Renee Weeks in Treatment: 2 Vital Signs Time Taken: 12:44 Temperature (F): 98.1 Height (in): 75 Pulse (bpm): 70 Weight (lbs): 191 Respiratory Rate (breaths/min): 18 Body Mass Index (BMI): 23.9 Blood Pressure (mmHg): 135/82 Reference Range: 80 - 120 mg / dl Electronic Signature(s) Signed: 12/01/2022 3:57:42 PM By: Baruch Gouty RN, BSN Entered By: Baruch Gouty on 12/01/2022 12:44:55

## 2022-12-06 ENCOUNTER — Other Ambulatory Visit: Payer: Self-pay | Admitting: Cardiovascular Disease

## 2022-12-06 DIAGNOSIS — Z79899 Other long term (current) drug therapy: Secondary | ICD-10-CM

## 2022-12-06 DIAGNOSIS — I517 Cardiomegaly: Secondary | ICD-10-CM

## 2022-12-06 MED ORDER — FUROSEMIDE 20 MG PO TABS
20.0000 mg | ORAL_TABLET | Freq: Every day | ORAL | 3 refills | Status: DC
Start: 2022-12-06 — End: 2024-01-03

## 2022-12-08 ENCOUNTER — Encounter (HOSPITAL_BASED_OUTPATIENT_CLINIC_OR_DEPARTMENT_OTHER): Payer: Medicare Other | Attending: General Surgery | Admitting: General Surgery

## 2022-12-08 ENCOUNTER — Ambulatory Visit (INDEPENDENT_AMBULATORY_CARE_PROVIDER_SITE_OTHER): Payer: Medicare Other | Admitting: Physician Assistant

## 2022-12-08 VITALS — Resp 16 | Ht 75.0 in | Wt 193.0 lb

## 2022-12-08 DIAGNOSIS — Z87891 Personal history of nicotine dependence: Secondary | ICD-10-CM | POA: Insufficient documentation

## 2022-12-08 DIAGNOSIS — S52022D Displaced fracture of olecranon process without intraarticular extension of left ulna, subsequent encounter for closed fracture with routine healing: Secondary | ICD-10-CM | POA: Diagnosis not present

## 2022-12-08 DIAGNOSIS — L97312 Non-pressure chronic ulcer of right ankle with fat layer exposed: Secondary | ICD-10-CM | POA: Insufficient documentation

## 2022-12-08 DIAGNOSIS — R600 Localized edema: Secondary | ICD-10-CM | POA: Diagnosis not present

## 2022-12-08 DIAGNOSIS — I4821 Permanent atrial fibrillation: Secondary | ICD-10-CM | POA: Insufficient documentation

## 2022-12-08 DIAGNOSIS — X58XXXD Exposure to other specified factors, subsequent encounter: Secondary | ICD-10-CM | POA: Insufficient documentation

## 2022-12-08 DIAGNOSIS — I872 Venous insufficiency (chronic) (peripheral): Secondary | ICD-10-CM | POA: Diagnosis not present

## 2022-12-08 DIAGNOSIS — I10 Essential (primary) hypertension: Secondary | ICD-10-CM | POA: Insufficient documentation

## 2022-12-08 DIAGNOSIS — Z79899 Other long term (current) drug therapy: Secondary | ICD-10-CM | POA: Insufficient documentation

## 2022-12-08 DIAGNOSIS — Z09 Encounter for follow-up examination after completed treatment for conditions other than malignant neoplasm: Secondary | ICD-10-CM | POA: Insufficient documentation

## 2022-12-08 DIAGNOSIS — L97812 Non-pressure chronic ulcer of other part of right lower leg with fat layer exposed: Secondary | ICD-10-CM | POA: Insufficient documentation

## 2022-12-08 DIAGNOSIS — M7989 Other specified soft tissue disorders: Secondary | ICD-10-CM

## 2022-12-08 DIAGNOSIS — I83003 Varicose veins of unspecified lower extremity with ulcer of ankle: Secondary | ICD-10-CM | POA: Diagnosis not present

## 2022-12-08 DIAGNOSIS — L97329 Non-pressure chronic ulcer of left ankle with unspecified severity: Secondary | ICD-10-CM | POA: Diagnosis not present

## 2022-12-08 NOTE — Progress Notes (Signed)
[facility] [place]   Reason for referral: Swollen B leg with non healing ulcers  History of Present Illness  Mike Day is a 77 y.o. male who presents with chief complaint: swollen leg.  Patient notes, onset of swelling chronic with recent increse in swelling and non healing ulcers over the right lateral malleolus.   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  There is unknown family history of venous disorders.  The patient is using compression stockings.  He has been seen by the [facility] wound center for 4 weeks now.  The lateral malleolus wound appears yo be improving per the patient and his wife.  They have been using left compression socks and right una boot compression for wound healing.  He is medically managed on Eliquis for Afib, HTN, hyperlipidemia managed with Statin.  He is a non tobacco user.      Past Medical History:  Diagnosis Date   Anemia    Early 20's   Atrial fibrillation 10/07/2014   3-14 day cardiac monitor 03/2019:  AFib, Avg HR 69, no significant pauses, PVCs, wide complex runs (aberrancy vs NSVT - longest 32 beats).   Atrial flutter 2008   Status post RFCA Dr Cristopher [place] 2008   Carotid artery plaque    Carotid US 8/21: Bilat ICA 1-39   Chronic atrial fibrillation    Eliquis   Closed nondisplaced fracture of left patella 01/20/2020   Dermatochalasis of both upper eyelids 07/01/2014   Dysrhythmia    a fib    Echocardiograms    Echo 12/14 - Severe basal septal hypertrophy, moderate LVH, EF 60-65%, moderate LAE, mild RAE // Echo 7/20: EF 60-65, severe asymmetric LVH, mild RVE, normal RV SF, moderate BAE, moderate MAC, moderate aortic valve calcification (noncoronary cusp), mild dilation of ascending aorta (41 mm)   Hx of adenomatous colonic polyps     Hyperlipidemia    Hypertension    Malignant neoplasm of prostate 05/18/2012   Neuromuscular disorder    neuropathy feet   Patellar sleeve fracture of left knee 2021   Prostate cancer 2006   Prostatectomy   Shingles 01/2019   left side of face/eye/neck/ear    SKIN CANCER, HX OF 03/11/2009   Moh's L hand for Squamous Cell;2 other squamous cells removed w/o Mohs 2013 Rolm Bookbinder MD  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    Past Surgical History:  Procedure Laterality Date   BLADDER SUSPENSION  2010   COLONOSCOPY W/ POLYPECTOMY  2003;2010;04/2014   Tubular adenoma: recall 5 yrs (after 04/2019)   EYE SURGERY Bilateral 2021,2022   FEMUR IM NAIL Left 07/12/2022   Procedure: INTRAMEDULLARY (IM) NAIL FEMORAL;  Surgeon: [name], MD;  Location: [facility];  Service: Orthopedics;  Laterality: Left;   HERNIA REPAIR  07/2008   Dr.Martin   INGUINAL HERNIA REPAIR Left 08/25/2020   Procedure: OPEN LEFT INGUINAL HERNIA REPAIR WITH MESH;  Surgeon: Johnathan Hausen, MD;  Location: [facility];  Service: General;  Laterality: Left;   MOHS SURGERY     left hand   MOHS SURGERY Left 12/2020   ear   ORIF ELBOW FRACTURE Left 07/14/2022   Procedure: OPEN REDUCTION INTERNAL FIXATION (ORIF) OF LEFT ELBOW;  Surgeon: [name], MD;  Location: [facility];  Service: Orthopedics;  Laterality: Left;   PROSTATECTOMY  2006   Robotic for adenocarcinoma; Dr Lawerance Bach   RADIOFREQUENCY ABLATION  01/15/2007   for ectopic atrial foci   SEPTOPLASTY  Age 46   TRANSTHORACIC ECHOCARDIOGRAM  09/02/2013   Normal.  EF 60%.  LAE and RAE.   XI ROBOTIC ASSISTED INGUINAL HERNIA REPAIR WITH MESH Right 01/11/2021   Procedure: XI ROBOTIC ASSISTED  RIGHT INGUINAL HERNIA REPAIR WITH MESH;  Surgeon: Johnathan Hausen, MD;  Location: [facility];  Service: General;  Laterality: Right;    Social History   Socioeconomic History   Marital status: Married    Spouse name: Not on file   Number of children: 1   Years of education:  Not on file   Highest education level: Master's degree (e.g., MA, MS, MEng, MEd, MSW, MBA)  Occupational History   Occupation: retired  Tobacco Use   Smoking status: Former    Types: Cigarettes    Quit date: 09/06/2007    Years since quitting: 15.2   Smokeless tobacco: Never   Tobacco comments:    smoked 1963 -2009 . Cigars < 1 / day on average 1992-2009  Vaping Use   Vaping Use: Never used  Substance and Sexual Activity   Alcohol use: Yes    Alcohol/week: 7.0 standard drinks of alcohol    Types: 7 Glasses of wine per week    Comment: Socially   Drug use: No   Sexual activity: Not Currently    Partners: Female  Other Topics Concern   Not on file  Social History Narrative   Married, 1 son, 2 grand-daughters.   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   Alcohol: 1 glass red wine per night.   Was once a long distance runner.   Social Determinants of Health   Financial Resource Strain: Low Risk  (09/09/2022)   Overall Financial Resource Strain (CARDIA)    Difficulty of Paying Living Expenses: Not hard at all  Food Insecurity: No Food Insecurity (09/09/2022)   Hunger Vital Sign    Worried About Running Out of Food in the Last Year: Never true    Ran Out of Food in the Last Year: Never true  Transportation Needs: No Transportation Needs (09/09/2022)   PRAPARE - Hydrologist (Medical): No    Lack of Transportation (Non-Medical): No  Physical Activity: Insufficiently Active (09/09/2022)   Exercise Vital Sign    Days of Exercise per Week: 1 day    Minutes of Exercise per Session: 40 min  Stress: Stress Concern Present (09/09/2022)   [place]    Feeling of Stress : To some extent  Social Connections: Moderately Isolated (09/09/2022)   Social Connection and Isolation Panel [NHANES]    Frequency of Communication with Friends and Family: More than three  times a week    Frequency of Social Gatherings with Friends and Family: Once a week    Attends Religious Services: Never    Marine scientist or Organizations: No    Attends Music therapist: Not on file    Marital Status: Married  Human resources officer Violence: Not At Risk (09/09/2022)   Humiliation, Afraid, Rape, and Kick questionnaire    Fear of Current or Ex-Partner: No    Emotionally Abused: No    Physically Abused: No    Sexually Abused: No  Family History  Problem Relation Age of Onset   Lung disease Father        Black Lung   Heart disease Father        Congenital   Emphysema Father    COPD Father    Diabetes Mother    Stroke Mother 55   Hypertension Brother    Heart disease Brother    Hyperlipidemia Brother    Heart attack Brother 56       sudden death   Cancer Neg Hx    Colon cancer Neg Hx    Esophageal cancer Neg Hx    Rectal cancer Neg Hx    Stomach cancer Neg Hx     Current Outpatient Medications on File Prior to Visit  Medication Sig Dispense Refill   acetaminophen (TYLENOL) 500 MG tablet Take 1,000 mg by mouth every 8 (eight) hours as needed for moderate pain.     alendronate (FOSAMAX) 70 MG tablet Take 1 tablet (70 mg total) by mouth every 7 (seven) days. Take with a full glass of water on an empty stomach. 4 tablet 11   amLODipine (NORVASC) 2.5 MG tablet Take 1 tablet (2.5 mg total) by mouth daily. (Patient taking differently: Take 2.5 mg by mouth daily. On hold until further notice) 90 tablet 1   apixaban (ELIQUIS) 5 MG TABS tablet Take 1 tablet (5 mg total) by mouth 2 (two) times daily. 180 tablet 1   cholecalciferol (VITAMIN D3) 25 MCG (1000 UNIT) tablet Take 2 tablets (2,000 Units total) by mouth daily. 180 tablet 3   Emollient (CETAPHIL) cream Apply 1 application topically daily.     furosemide (LASIX) 20 MG tablet Take 1 tablet (20 mg total) by mouth daily. 90 tablet 3   lisinopril (ZESTRIL) 20 MG tablet TAKE 1 TABLET DAILY 90 tablet 3    simvastatin (ZOCOR) 20 MG tablet TAKE 1 TABLET AT BEDTIME 90 tablet 3   triamcinolone (NASACORT) 55 MCG/ACT AERO nasal inhaler Place 1 spray into the nose daily as needed (allergies).     No current facility-administered medications on file prior to visit.    Allergies as of 12/08/2022 - Review Complete 12/08/2022  Allergen Reaction Noted   Levofloxacin Rash    Neosporin [neomycin-bacitracin zn-polymyx] Other (See Comments) 02/17/2017   Tape Other (See Comments) 03/18/2014     ROS:   General:  No weight loss, Fever, chills  HEENT: No recent headaches, no nasal bleeding, no visual changes, no sore throat  Neurologic: No dizziness, blackouts, seizures. No recent symptoms of stroke or mini- stroke. No recent episodes of slurred speech, or temporary blindness.  Cardiac: No recent episodes of chest pain/pressure, no shortness of breath at rest.  No shortness of breath with exertion.  Positive history of atrial fibrillation or irregular heartbeat  Vascular: No history of rest pain in feet.  No history of claudication.  positive history of non-healing ulcer, No history of DVT   Pulmonary: No home oxygen, no productive cough, no hemoptysis,  No asthma or wheezing  Musculoskeletal:  [ ]  Arthritis, [ ]  Low back pain,  [ x] Joint pain  Hematologic:No history of hypercoagulable state.  No history of easy bleeding.  No history of anemia  Gastrointestinal: No hematochezia or melena,  No gastroesophageal reflux, no trouble swallowing  Urinary: [ ]  chronic Kidney disease, [ ]  on HD - [ ]  MWF or [ ]  TTHS, [ ]  Burning with urination, [ ]  Frequent urination, [ ]  Difficulty urinating;   Skin:  No rashes  Psychological: No history of anxiety,  No history of depression  Physical Examination  Vitals:   12/08/22 0855  Resp: 16  Weight: 193 lb (87.5 kg)  Height: 6\' 3"  (1.905 m)    Body mass index is 24.12 kg/m.  General:  Alert and oriented, no acute distress HEENT: Normal Neck: No  bruit or JVD Pulmonary: Clear to auscultation bilaterally Cardiac: Regular Rate and Rhythm without murmur Abdomen: Soft, non-tender, non-distended, no mass, no scars Skin: No rash       Extremity Pulses:  2+ radial,  dorsalis pedis,  pulses bilaterally Musculoskeletal: Right foot/leg edema  Neurologic: Upper and lower extremity motor 5/5 and symmetric  DATA: Venous Reflux Times  +--------------+---------+------+-----------+------------+----------------+   RIGHT        Reflux NoRefluxReflux TimeDiameter cmsComments                                  Yes                                            +--------------+---------+------+-----------+------------+----------------+   CFV                    yes   >1 second                                +--------------+---------+------+-----------+------------+----------------+   FV mid        no                                                       +--------------+---------+------+-----------+------------+----------------+   Popliteal              yes   >1 second                                +--------------+---------+------+-----------+------------+----------------+   GSV at SFJ              yes    >500 ms      0.75                       +--------------+---------+------+-----------+------------+----------------+   GSV prox thigh          yes    >500 ms      0.44                       +--------------+---------+------+-----------+------------+----------------+   GSV mid thigh           yes    >500 ms      0.41                       +--------------+---------+------+-----------+------------+----------------+   GSV dist thigh          yes    >500 ms      0.34                       +--------------+---------+------+-----------+------------+----------------+   GSV at  knee   no                            0.32                        +--------------+---------+------+-----------+------------+----------------+   GSV prox calf           yes    >500 ms      0.31                       +--------------+---------+------+-----------+------------+----------------+   GSV mid calf  no                            0.18                       +--------------+---------+------+-----------+------------+----------------+   SSV Pop Fossa no                            0.32                       +--------------+---------+------+-----------+------------+----------------+   SSV prox calf           yes    >500 ms      0.34    chronic  thrombus  +--------------+---------+------+-----------+------------+----------------+   SSV mid calf            yes    >500 ms      0.27    chronic  thrombus  +--------------+---------+------+-----------+------------+----------------+   AASV o                  yes    >500 ms      0.28                       +--------------+---------+------+-----------+------------+----------------+   AASV p        no                            0.24                       +--------------+---------+------+-----------+------------+----------------+   AASV m        no                                                       +--------------+---------+------+-----------+------------+----------------+      +--------------+---------+------+-----------+------------+--------+  LEFT         Reflux NoRefluxReflux TimeDiameter cmsComments                          Yes                                   +--------------+---------+------+-----------+------------+--------+  CFV                    yes   >1 second                       +--------------+---------+------+-----------+------------+--------+  FV mid                  yes   >1 second                       +--------------+---------+------+-----------+------------+--------+  Popliteal    no                                               +--------------+---------+------+-----------+------------+--------+  GSV at [facility]              yes    >500 ms      0.85              +--------------+---------+------+-----------+------------+--------+  GSV prox thigh          yes    >500 ms      0.46              +--------------+---------+------+-----------+------------+--------+  GSV mid thigh           yes    >500 ms      0.43              +--------------+---------+------+-----------+------------+--------+  GSV dist thigh          yes    >500 ms      0.47              +--------------+---------+------+-----------+------------+--------+  GSV at knee             yes    >500 ms      0.43              +--------------+---------+------+-----------+------------+--------+  GSV prox calf           yes    >500 ms      0.49              +--------------+---------+------+-----------+------------+--------+  GSV mid calf            yes    >500 ms      0.34              +--------------+---------+------+-----------+------------+--------+  SSV Pop Fossa no                            0.15              +--------------+---------+------+-----------+------------+--------+  SSV prox calf no                            0.23              +--------------+---------+------+-----------+------------+--------+  SSV mid calf  no                            0.22              +--------------+---------+------+-----------+------------+--------+  AASV o        no                            0.27              +--------------+---------+------+-----------+------------+--------+  AASV p        no  0.25              +--------------+---------+------+-----------+------------+--------+         Summary:  Right:  - No evidence of deep vein thrombosis seen in the right lower extremity,  from the common femoral through the popliteal  veins.  - Color duplex evaluation of the right lower extremity shows there is  thrombus in the lesser saphenous vein.  - Venous reflux is noted in the right common femoral vein.  - Venous reflux is noted in the right sapheno-femoral junction.  - Venous reflux is noted in the right greater saphenous vein in the thigh.  - Venous reflux is noted in the right greater saphenous vein in the calf.  - Venous reflux is noted in the right popliteal vein.  - Venous reflux is noted in the right short saphenous vein. Partially  compressible, chronic thrombus  - Venous reflux is noted in the AASV origin area.     Left:  - No evidence of deep vein thrombosis seen in the left lower extremity,  from the common femoral through the popliteal veins.  - No evidence of superficial venous reflux seen in the left short  saphenous vein.  - Venous reflux is noted in the left common femoral vein.  - Venous reflux is noted in the left sapheno-femoral junction.  - Venous reflux is noted in the left greater saphenous vein in the thigh.  - Venous reflux is noted in the left greater saphenous vein in the calf.  - Venous reflux is noted in the left femoral vein.   - No reflux in the AASV.     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  He has a left lateral malleolus wound and edema into the foot.  He likely has mild lymphedema as well.  The GSV B is > 0.4 cm in most of the course of the GSV.  The is evidence of old SSV thrombus that is chronic.    He will continue compression therapy and local wound care.  He has 20-30 mm hg thigh high compression, elevation when at rest and exercise as tolerates.   He will f/u in 2 months to complete 3 months of compression therapy to be considered for laser ablation therapy.      Roxy Horseman PA-C [facility] [place] Office: [phone]  MD in clinic [place]

## 2022-12-09 ENCOUNTER — Other Ambulatory Visit: Payer: Self-pay

## 2022-12-09 NOTE — Progress Notes (Signed)
TALEN, POSER ([id number]) [id number] Page 1 of 8 Visit Report for 12/08/2022 Chief Complaint Document Details Patient Name: Date of Service: Mike Day CK F. 12/08/2022 1:15 PM Medical Record Number: [id number] Patient Account Number: [id number] Date of Birth/Sex: Treating RN: 12/24/1945 (77 y.o. M) Primary Care Provider: Felix Pacini Other Clinician: Referring Provider: Treating Provider/Extender: Rodman Comp, Renee Weeks in Treatment: 3 Information Obtained from: Patient Chief Complaint Patient presents for treatment of open ulcers due to venous insufficiency Electronic Signature(s) Signed: 12/08/2022 2:22:44 PM By: Duanne Guess MD FACS Entered By: Duanne Guess on 12/08/2022 14:22:44 -------------------------------------------------------------------------------- Debridement Details Patient Name: Date of Service: Mike Day, [name] 12/08/2022 1:15 PM Medical Record Number: [id number] Patient Account Number: [id number] Date of Birth/Sex: Treating RN: 08/01/1946 (77 y.o. [name] Primary Care Provider: Felix Pacini Other Clinician: Referring Provider: Treating Provider/Extender: Nada Boozer Weeks in Treatment: 3 Debridement Performed for Assessment: Wound #1 Right,Lateral Ankle Performed By: Physician Duanne Guess, MD Debridement Type: Debridement Severity of Tissue Pre Debridement: Fat layer exposed Level of Consciousness (Pre-procedure): Awake and Alert Pre-procedure Verification/Time Out Yes - 14:15 Taken: Start Time: 14:15 Pain Control: Lidocaine 4% T opical Solution T Area Debrided (L x W): otal 0.6 (cm) x 0.7 (cm) = 0.42 (cm) Tissue and other material debrided: Non-Viable, Eschar Level: Non-Viable Tissue Debridement Description: Selective/Open Wound Instrument: Curette Bleeding: Minimum Hemostasis Achieved: Pressure Procedural Pain: 0 Post Procedural Pain: 0 Response to Treatment:  Procedure was tolerated well Level of Consciousness (Post- Awake and Alert procedure): Post Debridement Measurements of Total Wound Length: (cm) 0.3 Width: (cm) 0.3 Depth: (cm) 0.1 Volume: (cm) 0.007 Character of Wound/Ulcer Post Debridement: Improved Severity of Tissue Post Debridement: Fat layer exposed Post Procedure Diagnosis Same as Pre-procedure Notes Scribed for Dr. Lady Gary by Zenaida Deed, RN Electronic Signature(s) Signed: 12/08/2022 3:30:44 PM By: Duanne Guess MD FACS Dan Maker ([id number]) [id number] Page 2 of 8 Signed: 12/08/2022 5:39:06 PM By: Zenaida Deed RN, BSN Entered By: Zenaida Deed on 12/08/2022 14:18:17 -------------------------------------------------------------------------------- HPI Details Patient Name: Date of Service: Mike Day, [name] 12/08/2022 1:15 PM Medical Record Number: [id number] Patient Account Number: [id number] Date of Birth/Sex: Treating RN: June 18, 1946 (77 y.o. M) Primary Care Provider: Felix Pacini Other Clinician: Referring Provider: Treating Provider/Extender: Rodman Comp, Renee Weeks in Treatment: 3 History of Present Illness HPI Description: ADMISSION 11/11/2022 This is a 77 year old man who suffered a fall in November 2023. He had a left hip fracture and left olecranon fracture. He had surgical treatment for both of these and was in rehabilitation for some time. As he became more active, he noted that his legs began to swell. He was seen by his primary care provider on February 16. She initiated Lasix 20 mg daily. He was also noted to have ulcers on both ankles. He was referred to the wound care center. While waiting for his appointment, he was seen by cardiology and had an updated echocardiogram performed. While he has severe left atrial enlargement, his ejection fraction is normal. He is not a diabetic. He has been applying bag balm to his ulcers. He does not wear compression  stockings. On his right lateral ankle, there is a semilinear ulcer with a typical stasis ulcer appearance. There is slough and eschar accumulation. On his left medial ankle, just posterior to the malleolus, there is a small circular ulcer with slough and eschar present. No concern for infection at either site. 11/25/2022: The left medial  ankle wound is healed. The right lateral ankle wound is smaller. There is some slough and eschar accumulation present. He did have his reflux studies performed and he has significant saphenous vein reflux bilaterally. Edema control is improved. 12/01/2022: The right lateral leg wound is about the same size. Again with slough and eschar accumulation. He has an appointment on April 4 with vascular surgery to discuss saphenous vein ablation. 12/08/2022: The right lateral leg wound is smaller and more superficial. There is a little slough and eschar buildup. He met with vascular surgery yesterday and they are planning saphenous vein ablation in a couple of months. Electronic Signature(s) Signed: 12/08/2022 2:23:17 PM By: Duanne Guess MD FACS Entered By: Duanne Guess on 12/08/2022 14:23:17 -------------------------------------------------------------------------------- Physical Exam Details Patient Name: Date of Service: Mike Day, [name] 12/08/2022 1:15 PM Medical Record Number: [id number] Patient Account Number: [id number] Date of Birth/Sex: Treating RN: 12-Sep-1945 (77 y.o. M) Primary Care Provider: Felix Pacini Other Clinician: Referring Provider: Treating Provider/Extender: Rodman Comp, Renee Weeks in Treatment: 3 Constitutional . Bradycardic, asymptomatic. . . no acute distress. Respiratory Normal work of breathing on room air. Notes 12/08/2022: The right lateral leg wound is smaller and more superficial. There is a little slough and eschar buildup. Electronic Signature(s) Signed: 12/08/2022 2:23:54 PM By: Duanne Guess MD FACS Entered By:  Duanne Guess on 12/08/2022 14:23:54 -------------------------------------------------------------------------------- Physician Orders Details Patient Name: Date of Service: Mike Day, [name] 12/08/2022 1:15 PM Medical Record Number: [id number] Patient Account Number: [id number] Date of Birth/Sex: Treating RN: 14-Jul-1946 (77 y.o. 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 - Dr. Lady Gary RM 1 Return A Thursday 4/11 @ 1:15 pm Anesthetic Wound #1 Right,Lateral Ankle (In clinic) Topical Lidocaine 5% applied to wound bed - prior to debridment Bathing/ Shower/ Hygiene May shower with protection but do not get wound dressing(s) wet. Protect dressing(s) with water repellant cover (for example, large plastic bag) or a cast cover and may then take shower. - Please keep dressings clean and dry at all times Edema Control - Lymphedema / SCD / Other Elevate legs to the level of the heart or above for 30 minutes daily and/or when sitting for 3-4 times a day throughout the day. Avoid standing for long periods of time. Moisturize legs daily. Wound Treatment Wound #1 - Ankle Wound Laterality: Right, Lateral Cleanser: Soap and Water 1 x Per Week Discharge Instructions: May shower and wash wound with dial antibacterial soap and water prior to dressing change. Peri-Wound Care: Triamcinolone 15 (g) 1 x Per Week Discharge Instructions: Use triamcinolone 15 (g) as  directed Peri-Wound Care: Sween Lotion (Moisturizing lotion) 1 x Per Week Discharge Instructions: Apply moisturizing lotion as directed Prim Dressing: Maxorb Extra Ag+ Alginate Dressing, 2x2 (in/in) 1 x Per Week ary Discharge Instructions: Apply to wound bed as instructed Secondary Dressing: Woven Gauze Sponge, Non-Sterile 4x4 in 1 x Per Week Discharge Instructions: Apply over primary dressing as directed. Compression Wrap: Urgo K2 Lite, two layer compression system, regular 1 x Per Week Discharge Instructions: Apply Urgo K2 Lite as directed (alternative  to 3 layer compression). Electronic Signature(s) Signed: 12/08/2022 3:30:44 PM By: Duanne Guess MD FACS Entered By: Duanne Guess on 12/08/2022 14:24:07 -------------------------------------------------------------------------------- Problem List Details Patient Name: Date of Service: Mike Day, [name] 12/08/2022 1:15 PM Medical Record Number: [id number] Patient Account Number: [id number] Date of Birth/Sex: Treating RN: 30-Mar-1946 (77 y.o. 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 12/08/2022 1:15 PM Medical Record Number: [id number] Patient Account Number: [id number] Date of Birth/Sex: Treating RN: 02-Dec-1945 (77 y.o. M) Primary Care Provider: Felix Pacini Other Clinician: Referring Provider: Treating Provider/Extender: Nada Boozer Weeks in Treatment: 3 Subjective Chief Complaint Information obtained from Patient Patient presents for treatment of open ulcers due to venous insufficiency History of Present Illness (HPI) ADMISSION 11/11/2022 This is a 77 year old man who suffered a fall in November 2023. He had a left hip fracture and left olecranon fracture. He had surgical treatment for both of these and was in rehabilitation for some time. As he became more active, he noted that his legs began to swell. He was seen by his primary care provider on February 16. She initiated Lasix 20 mg daily. He was also noted to have ulcers on both ankles. He was referred to the wound care center. While waiting for his appointment, he was seen by cardiology and had an updated echocardiogram performed. While he has severe left atrial enlargement, his ejection fraction is normal. He is not a diabetic. He has been applying bag balm to his ulcers. He does not wear compression stockings. On his right lateral ankle, there is a semilinear ulcer with a typical stasis ulcer appearance. There is slough and eschar accumulation. On his left medial ankle, just posterior to the malleolus, there is a small circular ulcer with slough and eschar present. No concern for infection at either site. 11/25/2022: The left medial ankle wound is healed. The right lateral ankle wound is smaller. There is some slough and eschar accumulation present. He did have his reflux studies performed and he has significant saphenous vein reflux  bilaterally. Edema control is improved. 12/01/2022: The right lateral leg wound is about the same size. Again with slough and eschar accumulation. He has an appointment on April 4 with vascular surgery to discuss saphenous vein ablation. 12/08/2022: The right lateral leg wound is smaller and more superficial. There is a little slough and eschar buildup. He met with vascular surgery yesterday and they are planning saphenous vein ablation in a couple of months. Patient History Information obtained from Patient. Family History Diabetes - Mother, Heart Disease - Father,Siblings, Hypertension - Siblings, Lung Disease - Father, Stroke - Mother. Social History Former smoker - ended on 09/06/2007, Marital Status - Married, Alcohol Use - Rarely, Drug Use -  No History, Caffeine Use - Daily. AKHILLES, MCTEE ([id number]) [id number] Page 5 of 8 Medical History Eyes Patient has history of Cataracts - bila eyes Denies history of Glaucoma, Optic Neuritis Ear/Nose/Mouth/Throat Denies history of Chronic sinus problems/congestion, Middle ear problems Hematologic/Lymphatic Patient has history of Anemia Denies history of Hemophilia, Human Immunodeficiency Virus, Lymphedema, Sickle Cell Disease Respiratory Denies history of Aspiration, Asthma, Chronic Obstructive Pulmonary Disease (COPD), Pneumothorax, Sleep Apnea, Tuberculosis Cardiovascular Patient has history of Arrhythmia - AFIB, Hypertension Gastrointestinal Denies history of Cirrhosis , Colitis, Crohnoos, Hepatitis A, Hepatitis B, Hepatitis C Endocrine Denies history of Type I Diabetes, Type II Diabetes Genitourinary Denies history of End Stage Renal Disease Immunological Denies history of Lupus Erythematosus, Raynaudoos Integumentary (Skin) Denies history of History of Burn Musculoskeletal Denies history of Gout, Rheumatoid Arthritis, Osteoarthritis, Osteomyelitis Oncologic Denies history of Received Chemotherapy,  Received Radiation Psychiatric Denies history of Anorexia/bulimia, Confinement Anxiety Objective Constitutional Bradycardic, asymptomatic. no acute distress. Vitals Time Taken: 1:25 AM, Height: 75 in, Weight: 191 lbs, BMI: 23.9, Temperature: 97.8 F, Pulse: 54 bpm, Respiratory Rate: 18 breaths/min, Blood Pressure: 132/70 mmHg. Respiratory Normal work of breathing on room air. General Notes: 12/08/2022: The right lateral leg wound is smaller and more superficial. There is a little slough and eschar buildup. Integumentary (Hair, Skin) Wound #1 status is Open. Original cause of wound was Blister. The date acquired was: 07/20/2022. The wound has been in treatment 3 weeks. The wound is located on the Right,Lateral Ankle. The wound measures 0.5cm length x 0.7cm width x 0.2cm depth; 0.275cm^2 area and 0.055cm^3 volume. There is Fat Layer (Subcutaneous Tissue) exposed. There is no tunneling or undermining noted. There is a small amount of serosanguineous drainage noted. The wound margin is distinct with the outline attached to the wound base. There is no granulation within the wound bed. There is a large (67-100%) amount of necrotic tissue within the wound bed including Eschar. The periwound skin appearance had no abnormalities noted for texture. The periwound skin appearance had no abnormalities noted for color. The periwound skin appearance did not exhibit: Dry/Scaly, Maceration. Periwound temperature was noted as No Abnormality. The periwound has tenderness on palpation. 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 There was a Selective/Open Wound Non-Viable  Tissue Debridement with a total area of 0.42 sq cm performed by Duanne Guess, MD. With the following Dan Maker ([id number]) [id number] Page 6 of 8 instrument(s): Curette to remove Non-Viable tissue/material. Material removed includes Eschar after achieving pain control using Lidocaine 4% Topical Solution. No specimens were taken. A time out was conducted at 14:15, prior to the start of the procedure. A Minimum amount of bleeding was controlled with Pressure. The procedure was tolerated well with a pain level of 0 throughout and a pain level of 0 following the procedure. Post Debridement Measurements: 0.3cm length x 0.3cm width x 0.1cm depth; 0.007cm^3 volume. Character of Wound/Ulcer Post Debridement is improved. Severity of Tissue Post Debridement is: Fat layer exposed. Post procedure Diagnosis Wound #1: Same as Pre-Procedure General Notes: Scribed for Dr. Lady Gary by Zenaida Deed, RN. Pre-procedure diagnosis of Wound #1 is a Venous Leg Ulcer located on the Right,Lateral Ankle . There was a Double Layer Compression Therapy Procedure by [employer],  Bonita QuinLinda, Charity fundraiserN. Post procedure Diagnosis Wound #1: Same as Pre-Procedure Notes: urgo lite. Plan Follow-up Appointments: Return Appointment in 1 week. - Dr. Lady Garyannon RM 1 Thursday 4/11 @ 1:15 pm Anesthetic: Wound #1 Right,Lateral Ankle: (In clinic) Topical Lidocaine 5% applied to wound bed - prior to debridment Bathing/ Shower/ Hygiene: May shower with protection but do not get wound dressing(s) wet. Protect dressing(s) with water repellant cover (for example, large plastic bag) or a cast cover and may then take shower. - Please keep dressings clean and dry at all times Edema Control - Lymphedema / SCD / Other: Elevate legs to the level of the heart or above for 30 minutes daily and/or when sitting for 3-4 times a day throughout the day. Avoid standing for long periods of time. Moisturize legs daily. WOUND #1: - Ankle  Wound Laterality: Right, Lateral Cleanser: Soap and Water 1 x Per Week/ Discharge Instructions: May shower and wash wound with dial antibacterial soap and water prior to dressing change. Peri-Wound Care: Triamcinolone 15 (g) 1 x Per Week/ Discharge Instructions: Use triamcinolone 15 (g) as directed Peri-Wound Care: Sween Lotion (Moisturizing lotion) 1 x Per Week/ Discharge Instructions: Apply moisturizing lotion as directed Prim Dressing: Maxorb Extra Ag+ Alginate Dressing, 2x2 (in/in) 1 x Per Week/ ary Discharge Instructions: Apply to wound bed as instructed Secondary Dressing: Woven Gauze Sponge, Non-Sterile 4x4 in 1 x Per Week/ Discharge Instructions: Apply over primary dressing as directed. Com pression Wrap: Urgo K2 Lite, two layer compression system, regular 1 x Per Week/ Discharge Instructions: Apply Urgo K2 Lite as directed (alternative to 3 layer compression). 12/08/2022: The right lateral leg wound is smaller and more superficial. There is a little slough and eschar buildup. I used a curette to debride slough and eschar from the wound. We will continue silver alginate and 3 layer compression equivalent. Follow-up in 1 week. Electronic Signature(s) Signed: 12/08/2022 2:24:43 PM By: Duanne Guessannon, Randa Riss MD FACS Entered By: Duanne Guessannon, Altovise Wahler on 12/08/2022 14:24:43 -------------------------------------------------------------------------------- HxROS Details Patient Name: Date of Service: Mike Day DMA N, [name] 12/08/2022 1:15 PM Medical Record Number: [id number] Patient Account Number: [id number] Date of Birth/Sex: Treating RN: 02/10/1946 (77 y.o. M) Primary Care Provider: Felix PaciniKuneff, Renee Other Clinician: Referring Provider: Treating Provider/Extender: Nada Boozerannon, Phylicia Mcgaugh Kuneff, Renee Weeks in Treatment: 3 Information Obtained From Patient Eyes Medical History: Positive for: Cataracts - bila eyes Negative for: Glaucoma; Optic Neuritis Ear/Nose/Mouth/Throat Medical History: Negative for:  Chronic sinus problems/congestion; Middle ear problems Dan MakerGOODMAN, Jasin F ([id number]) [id number] Page 7 of 8 Hematologic/Lymphatic Medical History: Positive for: Anemia Negative for: Hemophilia; Human Immunodeficiency Virus; Lymphedema; Sickle Cell Disease Respiratory Medical History: Negative for: Aspiration; Asthma; Chronic Obstructive Pulmonary Disease (COPD); Pneumothorax; Sleep Apnea; Tuberculosis Cardiovascular Medical History: Positive for: Arrhythmia - AFIB; Hypertension Gastrointestinal Medical History: Negative for: Cirrhosis ; Colitis; Crohns; Hepatitis A; Hepatitis B; Hepatitis C Endocrine Medical History: Negative for: Type I Diabetes; Type II Diabetes Genitourinary Medical History: Negative for: End Stage Renal Disease Immunological Medical History: Negative for: Lupus Erythematosus; Raynauds Integumentary (Skin) Medical History: Negative for: History of Burn Musculoskeletal Medical History: Negative for: Gout; Rheumatoid Arthritis; Osteoarthritis; Osteomyelitis Oncologic Medical History: Negative for: Received Chemotherapy; Received Radiation Psychiatric Medical History: Negative for: Anorexia/bulimia; Confinement Anxiety HBO Extended History Items Eyes: Cataracts Immunizations Pneumococcal Vaccine: Received Pneumococcal Vaccination: Yes Received Pneumococcal Vaccination On or After 60th Birthday: Yes Implantable Devices No devices added Family and Social History Diabetes: Yes - Mother; Heart Disease: Yes - Father,Siblings; Hypertension: Yes - Siblings; Lung Disease: Yes - Father; Stroke:  Yes - Mother; Former smoker - ended on 09/06/2007; Marital Status - Married; Alcohol Use: Rarely; Drug Use: No History; Caffeine Use: Daily; Financial Concerns: No; Food, Clothing or Shelter Needs: No; Support System Lacking: No; Transportation Concerns: No Electronic Signature(s) DEONDREA, HARNISCH ([id number])  [id number] Page 8 of 8 Signed: 12/08/2022 3:30:44 PM By: Duanne Guess MD FACS Entered By: Duanne Guess on 12/08/2022 14:23:24 -------------------------------------------------------------------------------- SuperBill Details Patient Name: Date of Service: Mike Day, [name] 12/08/2022 Medical Record Number: [id number] Patient Account Number: [id number] Date of Birth/Sex: Treating RN: 1946-05-31 (77 y.o. 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 Facility Procedures : CPT4 Code: [id number] Description: 97597 - DEBRIDE WOUND 1ST 20 SQ CM OR < ICD-10 Diagnosis Description L97.312 Non-pressure chronic ulcer of right ankle with fat layer exposed Modifier: Quantity: 1 Physician Procedures : CPT4 Code Description Modifier [id number] - WC PHYS LEVEL 3 - EST PT 25 ICD-10 Diagnosis Description L97.312 Non-pressure chronic ulcer of right ankle with fat layer exposed I83.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

## 2022-12-09 NOTE — Progress Notes (Signed)
Dan MakerGOODMAN, Jamond F ([id number]) [id number] Page 1 of 7 Visit Report for 12/08/2022 Arrival Information Details Patient Name: Date of Service: Jeri LagerGO O DMA N, [name] 12/08/2022 1:15 PM Medical Record Number: [id number] Patient Account Number: [id number] Date of Birth/Sex: Treating RN: 03/31/1946 (77 y.o. M) Primary Care Nilah Belcourt: Felix PaciniKuneff, Renee Other Clinician: Referring Robbi Spells: Treating Rane Dumm/Extender: Rodman Compannon, Jennifer Kuneff, Renee Weeks in Treatment: 3 Visit Information History Since Last Visit All ordered tests and consults were completed: No Patient Arrived: Ambulatory Added or deleted any medications: No Arrival Time: 13:25 Any new allergies or adverse reactions: No Accompanied By: wife Had a fall or experienced change in No Transfer Assistance: None activities of daily living that may affect Patient Identification Verified: Yes risk of falls: Secondary Verification Process Completed: Yes Signs or symptoms of abuse/neglect since last visito No Patient Requires Transmission-Based Precautions: No Hospitalized since last visit: No Patient Has Alerts: Yes Implantable device outside of the clinic excluding No Patient Alerts: Patient on Blood Thinner cellular tissue based products placed in the center since last visit: Has Dressing in Place as Prescribed: Yes Pain Present Now: No Electronic Signature(s) Signed: 12/08/2022 5:39:06 PM By: Zenaida DeedBoehlein, Linda RN, BSN Entered By: Zenaida DeedBoehlein, Linda on 12/08/2022 14:08:15 -------------------------------------------------------------------------------- Compression Therapy Details Patient Name: Date of Service: Tyna JakschGO O DMA N, [name] 12/08/2022 1:15 PM Medical Record Number: [id number] Patient Account Number: [id number] Date of Birth/Sex: Treating RN: 07/13/1946 (77 y.o. Damaris SchoonerM) Boehlein, Linda Primary Care Jamon Hayhurst: Felix PaciniKuneff, Renee Other Clinician: Referring Johann Gascoigne: Treating Alayziah Tangeman/Extender: Nada Boozerannon, Jennifer Kuneff,  Renee Weeks in Treatment: 3 Compression Therapy Performed for Wound Assessment: Wound #1 Right,Lateral Ankle Performed By: Clinician Zenaida DeedBoehlein, Linda, RN Compression Type: Double Layer Post Procedure Diagnosis Same as Pre-procedure Notes urgo lite Electronic Signature(s) Signed: 12/08/2022 5:39:06 PM By: Zenaida DeedBoehlein, Linda RN, BSN Entered By: Zenaida DeedBoehlein, Linda on 12/08/2022 14:16:18 -------------------------------------------------------------------------------- Encounter Discharge Information Details Patient Name: Date of Service: Tyna JakschGO O DMA N, [name] 12/08/2022 1:15 PM Medical Record Number: [id number] Patient Account Number: [id number] Date of Birth/Sex: Treating RN: 03/08/1946 (77 y.o. Damaris SchoonerM) Boehlein, Linda Primary Care Sukhman Martine: Felix PaciniKuneff, Renee Other Clinician: Referring Yorley Buch: Treating Sonja Manseau/Extender: Nehemiah Massedannon, Jennifer Kuneff, Renee Weeks in Treatment: 3 Encounter Discharge Information Items Post Procedure Vitals Discharge Condition: Stable Temperature (F): 97.8 Sylvie FarrierGOODMAN, Choice F ([id number]) [id number] Page 2 of 7 Ambulatory Status: Ambulatory Pulse (bpm): 54 Discharge Destination: Home Respiratory Rate (breaths/min): 18 Transportation: Private Auto Blood Pressure (mmHg): 132/70 Accompanied By: spouse Schedule Follow-up Appointment: Yes Clinical Summary of Care: Patient Declined Electronic Signature(s) Signed: 12/08/2022 5:39:06 PM By: Zenaida DeedBoehlein, Linda RN, BSN Entered By: Zenaida DeedBoehlein, Linda on 12/08/2022 14:31:41 -------------------------------------------------------------------------------- Lower Extremity Assessment Details Patient Name: Date of Service: [name] DMA N, [name] 12/08/2022 1:15 PM Medical Record Number: [id number] Patient Account Number: [id number] Date of Birth/Sex: Treating RN: 04/10/1946 (77 y.o. Damaris SchoonerM) Boehlein, Linda Primary Care Robb Sibal: Felix PaciniKuneff, Renee Other Clinician: Referring Vegas Coffin: Treating Nikolos Billig/Extender: Rodman Compannon, Jennifer Kuneff,  Renee Weeks in Treatment: 3 Edema Assessment Assessed: [Left: No] [Right: No] Edema: [Left: N] [Right: o] Calf Left: Right: Point of Measurement: From Medial Instep 34 cm Ankle Left: Right: Point of Measurement: From Medial Instep 21 cm Vascular Assessment Pulses: Dorsalis Pedis Palpable: [Right:Yes] Electronic Signature(s) Signed: 12/08/2022 5:39:06 PM By: Zenaida DeedBoehlein, Linda RN, BSN Entered By: Zenaida DeedBoehlein, Linda on 12/08/2022 14:09:02 -------------------------------------------------------------------------------- Multi Wound Chart Details Patient Name: Date of Service: Tyna JakschGO O DMA N, [name] 12/08/2022 1:15 PM Medical Record Number: [id number] Patient Account Number: [id number] Date of Birth/Sex: Treating RN: 01/16/1946 (76 y.o.  M) Primary Care Cristine Daw: Felix Pacini Other Clinician: Referring Pamela Maddy: Treating Jalyah Weinheimer/Extender: Rodman Comp, Renee Weeks in Treatment: 3 Vital Signs Height(in): 75 Pulse(bpm): 54 Weight(lbs): 191 Blood Pressure(mmHg): 132/70 Body Mass Index(BMI): 23.9 Temperature(F): 97.8 Respiratory Rate(breaths/min): 18 [1:Photos:] [N/A:N/A] Right, Lateral Ankle N/A N/A Wound Location: Blister N/A N/A Wounding Event: Venous Leg Ulcer N/A N/A Primary Etiology: Cataracts, Anemia, Arrhythmia, N/A N/A Comorbid History: Hypertension 07/20/2022 N/A N/A Date Acquired: 3 N/A N/A Weeks of Treatment: Open N/A N/A Wound Status: No N/A N/A Wound Recurrence: 0.5x0.7x0.2 N/A N/A Measurements L x W x D (cm) 0.275 N/A N/A A (cm) : rea 0.055 N/A N/A Volume (cm) : 87.70% N/A N/A % Reduction in A rea: 75.40% N/A N/A % Reduction in Volume: Full Thickness Without Exposed N/A N/A Classification: Support Structures Small N/A N/A Exudate A mount: Serosanguineous N/A N/A Exudate Type: red, brown N/A N/A Exudate Color: Distinct, outline attached N/A N/A Wound Margin: None Present (0%) N/A N/A Granulation A mount: Large (67-100%) N/A  N/A Necrotic A mount: Eschar N/A N/A Necrotic Tissue: Fat Layer (Subcutaneous Tissue): Yes N/A N/A Exposed Structures: Fascia: No Tendon: No Muscle: No Joint: No Bone: No Small (1-33%) N/A N/A Epithelialization: Debridement - Selective/Open Wound N/A N/A Debridement: Pre-procedure Verification/Time Out 14:15 N/A N/A Taken: Lidocaine 4% Topical Solution N/A N/A Pain Control: Necrotic/Eschar N/A N/A Tissue Debrided: Non-Viable Tissue N/A N/A Level: 0.42 N/A N/A Debridement A (sq cm): rea Curette N/A N/A Instrument: Minimum N/A N/A Bleeding: Pressure N/A N/A Hemostasis A chieved: 0 N/A N/A Procedural Pain: 0 N/A N/A Post Procedural Pain: Procedure was tolerated well N/A N/A Debridement Treatment Response: 0.3x0.3x0.1 N/A N/A Post Debridement Measurements L x W x D (cm) 0.007 N/A N/A Post Debridement Volume: (cm) No Abnormalities Noted N/A N/A Periwound Skin Texture: Maceration: No N/A N/A Periwound Skin Moisture: Dry/Scaly: No No Abnormalities Noted N/A N/A Periwound Skin Color: No Abnormality N/A N/A Temperature: Yes N/A N/A Tenderness on Palpation: Compression Therapy N/A N/A Procedures Performed: Debridement Treatment Notes Electronic Signature(s) Signed: 12/08/2022 2:22:37 PM By: Duanne Guess MD FACS Entered By: Duanne Guess on 12/08/2022 14:22:36 -------------------------------------------------------------------------------- Multi-Disciplinary Care Plan Details Patient Name: Date of Service: [name], [name] 12/08/2022 1:15 PM Medical Record Number: [id number] Patient Account Number: [id number] Date of Birth/Sex: Treating RN: 04/29/1946 (77 y.o. [name] Primary Care Harris Kistler: Felix Pacini Other Clinician: Referring Egypt Marchiano: Treating Josalyn Dettmann/Extender: Nehemiah Massed in Treatment: 3 Multidisciplinary Care Plan reviewed with physician JARRAD, MCLEES ([id number]) [id number]  Page 4 of 7 Active Inactive Nutrition Nursing Diagnoses: Potential for alteratiion in Nutrition/Potential for imbalanced nutrition Goals: Patient/caregiver agrees to and verbalizes understanding of need to use nutritional supplements and/or vitamins as prescribed Date Initiated: 11/11/2022 Target Resolution Date: 01/03/2023 Goal Status: Active Interventions: Assess patient nutrition upon admission and as needed per policy Treatment Activities: Dietary management education, guidance and counseling : 11/11/2022 Notes: Venous Leg Ulcer Nursing Diagnoses: Potential for venous Insuffiency (use before diagnosis confirmed) Goals: Patient will maintain optimal edema control Date Initiated: 11/11/2022 Target Resolution Date: 01/03/2023 Goal Status: Active Interventions: Compression as ordered Provide education on venous insufficiency Treatment Activities: Therapeutic compression applied : 11/11/2022 Notes: Wound/Skin Impairment Nursing Diagnoses: Impaired tissue integrity Goals: Ulcer/skin breakdown will have a volume reduction of 30% by week 4 Date Initiated: 11/11/2022 Target Resolution Date: 01/03/2023 Goal Status: Active Interventions: Assess ulceration(s) every visit Provide education on smoking Treatment Activities: Skin care regimen initiated : 11/11/2022 Notes: Electronic Signature(s) Signed: 12/08/2022 5:39:06 PM By:  Zenaida Deed RN, BSN Entered By: Zenaida Deed on 12/08/2022 14:10:21 -------------------------------------------------------------------------------- Pain Assessment Details Patient Name: Date of Service: Jeri Lager CK F. 12/08/2022 1:15 PM Medical Record Number: [id number] Patient Account Number: [id number] Date of Birth/Sex: Treating RN: 1946-09-03 (77 y.o. M) Primary Care Teresita Fanton: Felix Pacini Other Clinician: Referring Williard Keller: Treating Hawley Pavia/Extender: Rodman Comp, Renee Weeks in Treatment: [address] LENDAL, FURNAS F ([id number])  [id number] Page 5 of 7 Location of Pain Severity and Description of Pain Patient Has Paino No Site Locations Pain Management and Medication Current Pain Management: Electronic Signature(s) Signed: 12/08/2022 5:39:06 PM By: Zenaida Deed RN, BSN Entered By: Zenaida Deed on 12/08/2022 14:08:25 -------------------------------------------------------------------------------- Patient/Caregiver Education Details Patient Name: Date of Service: [name], [name] 4/4/2024andnbsp1:15 PM Medical Record Number: [id number] Patient Account Number: [id number] Date of Birth/Gender: Treating RN: 1946/02/03 (77 y.o. [name] Primary Care Physician: Felix Pacini Other Clinician: Referring Physician: Treating Physician/Extender: Nehemiah Massed in Treatment: 3 Education Assessment Education Provided To: Patient Education Topics Provided Venous: Methods: Explain/Verbal Responses: Reinforcements needed, State content correctly Wound/Skin Impairment: Methods: Explain/Verbal Responses: Reinforcements needed, State content correctly Electronic Signature(s) Signed: 12/08/2022 5:39:06 PM By: Zenaida Deed RN, BSN Entered By: Zenaida Deed on 12/08/2022 14:10:44 -------------------------------------------------------------------------------- Wound Assessment Details Patient Name: Date of Service: [name], [name] 12/08/2022 1:15 PM Medical Record Number: [id number] Patient Account Number: [id number] Date of Birth/Sex: Treating RN: 06/20/1946 (77 y.o. M) Primary Care Katerine Morua: Felix Pacini Other Clinician: Referring Treylon Henard: Treating John Vasconcelos/Extender: Rodman Comp, Renee Weeks in Treatment: [address] ISAHA, CONTORNO F ([id number]) [id number] Page 6 of 7 Wound Status Wound Number: 1 Primary Etiology: Venous Leg Ulcer Wound Location: Right, Lateral Ankle Wound Status: Open Wounding Event:  Blister Comorbid History: Cataracts, Anemia, Arrhythmia, Hypertension Date Acquired: 07/20/2022 Weeks Of Treatment: 3 Clustered Wound: No Photos Wound Measurements Length: (cm) 0.5 Width: (cm) 0.7 Depth: (cm) 0.2 Area: (cm) 0.275 Volume: (cm) 0.055 % Reduction in Area: 87.7% % Reduction in Volume: 75.4% Epithelialization: Small (1-33%) Tunneling: No Undermining: No Wound Description Classification: Full Thickness Without Exposed Sup Wound Margin: Distinct, outline attached Exudate Amount: Small Exudate Type: Serosanguineous Exudate Color: red, brown port Structures Wound Bed Granulation Amount: None Present (0%) Exposed Structure Necrotic Amount: Large (67-100%) Fascia Exposed: No Necrotic Quality: Eschar Fat Layer (Subcutaneous Tissue) Exposed: Yes Tendon Exposed: No Muscle Exposed: No Joint Exposed: No Bone Exposed: No Periwound Skin Texture Texture Color No Abnormalities Noted: Yes No Abnormalities Noted: Yes Moisture Temperature / Pain No Abnormalities Noted: No Temperature: No Abnormality Dry / Scaly: No Tenderness on Palpation: Yes Maceration: No Treatment Notes Wound #1 (Ankle) Wound Laterality: Right, Lateral Cleanser Soap and Water Discharge Instruction: May shower and wash wound with dial antibacterial soap and water prior to dressing change. Peri-Wound Care Triamcinolone 15 (g) Discharge Instruction: Use triamcinolone 15 (g) as directed Sween Lotion (Moisturizing lotion) Discharge Instruction: Apply moisturizing lotion as directed Topical Primary Dressing Maxorb Extra Ag+ Alginate Dressing, 2x2 (in/in) Discharge Instruction: Apply to wound bed as instructed SHANAN, BENNINGTON ([id number]) [id number] Page 7 of 7 Secondary Dressing Woven Gauze Sponge, Non-Sterile 4x4 in Discharge Instruction: Apply over primary dressing as directed. Secured With Compression Wrap Urgo K2 Lite, two layer compression system,  regular Discharge Instruction: Apply Urgo K2 Lite as directed (alternative to 3 layer compression). Compression Stockings Add-Ons Electronic Signature(s) Signed: 12/08/2022 5:39:06 PM By: Zenaida Deed RN, BSN Entered By: Zenaida Deed on 12/08/2022 14:09:58 -------------------------------------------------------------------------------- Vitals Details Patient  Name: Date of Service: Jeri Lager CK F. 12/08/2022 1:15 PM Medical Record Number: [id number] Patient Account Number: [id number] Date of Birth/Sex: Treating RN: 10/08/1945 (77 y.o. M) Primary Care Athel Merriweather: Felix Pacini Other Clinician: Referring Lyndal Reggio: Treating Rihana Kiddy/Extender: Rodman Comp, Renee Weeks in Treatment: 3 Vital Signs Time Taken: 01:25 Temperature (F): 97.8 Height (in): 75 Pulse (bpm): 54 Weight (lbs): 191 Respiratory Rate (breaths/min): 18 Body Mass Index (BMI): 23.9 Blood Pressure (mmHg): 132/70 Reference Range: 80 - 120 mg / dl Electronic Signature(s) Signed: 12/08/2022 5:39:06 PM By: Zenaida Deed RN, BSN Entered By: Zenaida Deed on 12/08/2022 14:08:20

## 2022-12-15 ENCOUNTER — Other Ambulatory Visit: Payer: Self-pay

## 2022-12-15 ENCOUNTER — Encounter (HOSPITAL_BASED_OUTPATIENT_CLINIC_OR_DEPARTMENT_OTHER): Payer: Medicare Other | Admitting: General Surgery

## 2022-12-15 ENCOUNTER — Telehealth: Payer: Self-pay | Admitting: Family Medicine

## 2022-12-15 DIAGNOSIS — L97329 Non-pressure chronic ulcer of left ankle with unspecified severity: Secondary | ICD-10-CM | POA: Diagnosis not present

## 2022-12-15 DIAGNOSIS — L97812 Non-pressure chronic ulcer of other part of right lower leg with fat layer exposed: Secondary | ICD-10-CM | POA: Diagnosis not present

## 2022-12-15 DIAGNOSIS — L97312 Non-pressure chronic ulcer of right ankle with fat layer exposed: Secondary | ICD-10-CM | POA: Diagnosis not present

## 2022-12-15 DIAGNOSIS — S52022D Displaced fracture of olecranon process without intraarticular extension of left ulna, subsequent encounter for closed fracture with routine healing: Secondary | ICD-10-CM | POA: Diagnosis not present

## 2022-12-15 DIAGNOSIS — I872 Venous insufficiency (chronic) (peripheral): Secondary | ICD-10-CM | POA: Diagnosis not present

## 2022-12-15 DIAGNOSIS — I83003 Varicose veins of unspecified lower extremity with ulcer of ankle: Secondary | ICD-10-CM | POA: Diagnosis not present

## 2022-12-15 MED ORDER — ALENDRONATE SODIUM 70 MG PO TABS: 70.0000 mg | ORAL_TABLET | ORAL | 1 refills | Status: DC

## 2022-12-16 ENCOUNTER — Ambulatory Visit: Payer: Medicare Other | Admitting: Physician Assistant

## 2022-12-16 NOTE — Progress Notes (Signed)
Mike Day, Mike Day ([id number]) [id number] Page 1 of 8 Visit Report for 12/15/2022 Chief Complaint Document Details Patient Name: Date of Service: Mike Day CK F. 12/15/2022 1:15 PM Medical Record Number: [id number] Patient Account Number: [id number] Date of Birth/Sex: Treating RN: [birth year] (77 y.o. M) Primary Care Provider: Felix Pacini Other Clinician: Referring Provider: Treating Provider/Extender: Rodman Comp, Renee Weeks in Treatment: 4 Information Obtained from: Patient Chief Complaint Patient presents for treatment of open ulcers due to venous insufficiency Electronic Signature(s) Signed: 12/15/2022 2:16:47 PM By: Duanne Guess MD FACS Entered By: Duanne Guess on 12/15/2022 14:16:47 -------------------------------------------------------------------------------- Debridement Details Patient Name: Date of Service: Mike Day, Mike CK F. 12/15/2022 1:15 PM Medical Record Number: [id number] Patient Account Number: [id number] Date of Birth/Sex: Treating RN: September 10, 1945 (77 y.o. [name] Primary Care Provider: Felix Pacini Other Clinician: Referring Provider: Treating Provider/Extender: Nada Boozer Weeks in Treatment: 4 Debridement Performed for Assessment: Wound #1 Right,Lateral Ankle Performed By: Physician Duanne Guess, MD Debridement Type: Debridement Severity of Tissue Pre Debridement: Fat layer exposed Level of Consciousness (Pre-procedure): Awake and Alert Pre-procedure Verification/Time Out Yes - 13:50 Taken: Start Time: 13:50 T Area Debrided (L x W): otal 0.5 (cm) x 0.7 (cm) = 0.35 (cm) Tissue and other material debrided: Non-Viable, Eschar, Slough, Slough Level: Non-Viable Tissue Debridement Description: Selective/Open Wound Instrument: Curette Bleeding: Minimum Hemostasis Achieved: Pressure Procedural Pain: 0 Post Procedural Pain: 0 Response to Treatment: Procedure was tolerated  well Level of Consciousness (Post- Awake and Alert procedure): Post Debridement Measurements of Total Wound Length: (cm) 0.5 Width: (cm) 0.7 Depth: (cm) 0.1 Volume: (cm) 0.027 Character of Wound/Ulcer Post Debridement: Improved Severity of Tissue Post Debridement: Fat layer exposed Post Procedure Diagnosis Same as Pre-procedure Notes Scribed for Dr. Lady Gary by Zenaida Deed, RN Electronic Signature(s) Signed: 12/15/2022 2:26:34 PM By: Duanne Guess MD FACS Signed: 12/15/2022 5:21:32 PM By: Zenaida Deed RN, BSN Mike Day ([id number]) [id number] Page 2 of 8 Entered By: Zenaida Deed on 12/15/2022 13:52:54 -------------------------------------------------------------------------------- HPI Details Patient Name: Date of Service: Mike Day CK F. 12/15/2022 1:15 PM Medical Record Number: [id number] Patient Account Number: [id number] Date of Birth/Sex: Treating RN: [birth year] (77 y.o. M) Primary Care Provider: Felix Pacini Other Clinician: Referring Provider: Treating Provider/Extender: Rodman Comp, Renee Weeks in Treatment: 4 History of Present Illness HPI Description: ADMISSION 11/11/2022 This is a 77 year old man who suffered a fall in November 2023. He had a left hip fracture and left olecranon fracture. He had surgical treatment for both of these and was in rehabilitation for some time. As he became more active, he noted that his legs began to swell. He was seen by his primary care provider on February 16. She initiated Lasix 20 mg daily. He was also noted to have ulcers on both ankles. He was referred to the wound care center. While waiting for his appointment, he was seen by cardiology and had an updated echocardiogram performed. While he has severe left atrial enlargement, his ejection fraction is normal. He is not a diabetic. He has been applying bag balm to his ulcers. He does not wear compression stockings. On his right  lateral ankle, there is a semilinear ulcer with a typical stasis ulcer appearance. There is slough and eschar accumulation. On his left medial ankle, just posterior to the malleolus, there is a small circular ulcer with slough and eschar present. No concern for infection at either site. 11/25/2022: The left medial ankle wound is healed. The  right lateral ankle wound is smaller. There is some slough and eschar accumulation present. He did have his reflux studies performed and he has significant saphenous vein reflux bilaterally. Edema control is improved. 12/01/2022: The right lateral leg wound is about the same size. Again with slough and eschar accumulation. He has an appointment on April 4 with vascular surgery to discuss saphenous vein ablation. 12/08/2022: The right lateral leg wound is smaller and more superficial. There is a little slough and eschar buildup. He met with vascular surgery yesterday and they are planning saphenous vein ablation in a couple of months. 12/15/2022: The right lateral ankle wound continues to contract. It is more superficial. There is some slough and eschar that has reaccumulated. Edema control is good. Electronic Signature(s) Signed: 12/15/2022 2:17:27 PM By: Duanne Guess MD FACS Entered By: Duanne Guess on 12/15/2022 14:17:27 -------------------------------------------------------------------------------- Physical Exam Details Patient Name: Date of Service: Mike Day, Mike CK F. 12/15/2022 1:15 PM Medical Record Number: [id number] Patient Account Number: [id number] Date of Birth/Sex: Treating RN: [birth year] (77 y.o. M) Primary Care Provider: Felix Pacini Other Clinician: Referring Provider: Treating Provider/Extender: Rodman Comp, Renee Weeks in Treatment: 4 Constitutional . . . . no acute distress. Respiratory Normal work of breathing on room air. Notes 12/15/2022: The right lateral ankle wound continues to contract. It is more superficial.  There is some slough and eschar that has reaccumulated. Edema control is good. Electronic Signature(s) Signed: 12/15/2022 2:20:20 PM By: Duanne Guess MD FACS Entered By: Duanne Guess on 12/15/2022 14:20:19 -------------------------------------------------------------------------------- Physician Orders Details Patient Name: Date of Service: Mike Day, Mike CK F. 12/15/2022 1:15 PM Medical Record Number: [id number] Patient Account Number: [id number] Mike Day, Mike Day ([id number]) [id number] Page 3 of 8 Date of Birth/Sex: Treating RN: 05-Jun-1946 (77 y.o. 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 - Dr. Lady Gary RM 1 Return A Thursday 4/18 @ 11:15 am Anesthetic Wound #1 Right,Lateral Ankle (In clinic) Topical Lidocaine 5% applied to wound bed - prior to debridment Bathing/ Shower/ Hygiene May shower with protection but do not get wound dressing(s) wet. Protect dressing(s) with water repellant cover (for example, large plastic bag) or a cast cover and may then take shower. - Please keep dressings clean and dry at all times Edema Control - Lymphedema / SCD / Other Elevate legs to the level of the heart or above for 30 minutes daily and/or when sitting for 3-4 times a day throughout the day. Avoid standing for long periods of time. Moisturize legs daily. Wound Treatment Wound #1 - Ankle Wound Laterality: Right, Lateral Cleanser: Soap and Water 1 x Per Week Discharge Instructions: May shower and wash wound  with dial antibacterial soap and water prior to dressing change. Peri-Wound Care: Triamcinolone 15 (g) 1 x Per Week Discharge Instructions: Use triamcinolone 15 (g) as directed Peri-Wound Care: Sween Lotion (Moisturizing lotion) 1 x Per Week Discharge Instructions: Apply moisturizing lotion as directed Topical: Mupirocin Ointment 1 x Per Week Discharge Instructions: Apply Mupirocin (Bactroban) as instructed Prim Dressing: Maxorb Extra Ag+ Alginate Dressing, 2x2 (in/in) 1 x Per Week ary Discharge Instructions: Apply to wound bed as instructed Secondary Dressing:  Woven Gauze Sponge, Non-Sterile 4x4 in 1 x Per Week Discharge Instructions: Apply over primary dressing as directed. Compression Wrap: Urgo K2 Lite, two layer compression system, regular 1 x Per Week Discharge Instructions: Apply Urgo K2 Lite as directed (alternative to 3 layer compression). Electronic Signature(s) Signed: 12/15/2022 2:26:34 PM By: Duanne Guess MD FACS Entered By: Duanne Guess on 12/15/2022 14:20:29 -------------------------------------------------------------------------------- Problem List Details Patient Name: Date of Service: Mike Day, Mike CK F. 12/15/2022 1:15 PM Medical Record Number: [id number] Patient Account Number: [id number] Date of Birth/Sex: Treating RN: 1945-09-16 (77 y.o. [name] Primary Care Provider: Felix Pacini Other Clinician: Referring Provider: Treating Provider/Extender: Nehemiah Massed in Treatment: [address] 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 12/15/2022 1:15 PM Medical Record Number: [id number] Patient Account Number: [id number] Date of Birth/Sex: Treating RN: Jan 10, 1946 (77 y.o. M) Primary Care Provider: Felix Pacini Other Clinician: Referring Provider: Treating Provider/Extender: Rodman Comp, Renee Weeks in Treatment: 4 Subjective Chief Complaint Information obtained from Patient Patient presents for treatment of open ulcers due to venous insufficiency History of Present Illness (HPI) ADMISSION 11/11/2022 This is a 77 year old man who suffered a fall in November 2023. He had a left hip fracture and left olecranon fracture. He had surgical treatment for both of these and was in rehabilitation for some time. As he became more active, he noted that his legs began to swell. He was seen by his primary care provider on February 16. She initiated Lasix 20 mg daily. He was also noted to have ulcers on both ankles. He was referred to the wound care center. While waiting for his appointment, he was seen by cardiology and had an updated echocardiogram performed. While he has severe left atrial enlargement, his ejection fraction is normal. He is not a diabetic. He has been applying bag balm to his ulcers. He does not wear compression stockings. On his right lateral ankle, there is a semilinear ulcer with a typical stasis ulcer appearance. There is slough and eschar accumulation. On his left medial ankle, just posterior to the malleolus, there is a small circular ulcer with  slough and eschar present. No concern for infection at either site. 11/25/2022: The left medial ankle wound is healed. The right lateral ankle wound is smaller. There is some slough and eschar accumulation present. He did have his reflux studies performed and he has significant saphenous vein reflux bilaterally. Edema control is improved. 12/01/2022: The right lateral leg wound is about the same size. Again with slough and eschar accumulation. He has an appointment on April 4 with vascular surgery to discuss saphenous vein ablation. 12/08/2022: The right lateral leg wound is smaller and more superficial. There is a little slough and eschar buildup. He met with vascular surgery yesterday and they are planning saphenous vein ablation in a couple of months. 12/15/2022: The  right lateral ankle wound continues to contract. It is more superficial. There is some slough and eschar that has reaccumulated. Edema control is good. Mike Day, Mike Day ([id number]) [id number] Page 5 of 8 Patient History Information obtained from Patient. Family History Diabetes - Mother, Heart Disease - Father,Siblings, Hypertension - Siblings, Lung Disease - Father, Stroke - Mother. Social History Former smoker - ended on 09/06/2007, Marital Status - Married, Alcohol Use - Rarely, Drug Use - No History, Caffeine Use - Daily. Medical History Eyes Patient has history of Cataracts - bila eyes Denies history of Glaucoma, Optic Neuritis Ear/Nose/Mouth/Throat Denies history of Chronic sinus problems/congestion, Middle ear problems Hematologic/Lymphatic Patient has history of Anemia Denies history of Hemophilia, Human Immunodeficiency Virus, Lymphedema, Sickle Cell Disease Respiratory Denies history of Aspiration, Asthma, Chronic Obstructive Pulmonary Disease (COPD), Pneumothorax, Sleep Apnea, Tuberculosis Cardiovascular Patient has history of Arrhythmia - AFIB, Hypertension Gastrointestinal Denies history  of Cirrhosis , Colitis, Crohnoos, Hepatitis A, Hepatitis B, Hepatitis C Endocrine Denies history of Type I Diabetes, Type II Diabetes Genitourinary Denies history of End Stage Renal Disease Immunological Denies history of Lupus Erythematosus, Raynaudoos Integumentary (Skin) Denies history of History of Burn Musculoskeletal Denies history of Gout, Rheumatoid Arthritis, Osteoarthritis, Osteomyelitis Oncologic Denies history of Received Chemotherapy, Received Radiation Psychiatric Denies history of Anorexia/bulimia, Confinement Anxiety Objective Constitutional no acute distress. Vitals Time Taken: 1:26 PM, Height: 75 in, Weight: 191 lbs, BMI: 23.9, Temperature: 98.2 F, Pulse: 62 bpm, Respiratory Rate: 18 breaths/min, Blood Pressure: 122/64 mmHg. Respiratory Normal work of breathing on room air. General Notes: 12/15/2022: The right lateral ankle wound continues to contract. It is more superficial. There is some slough and eschar that has reaccumulated. Edema control is good. Integumentary (Hair, Skin) Wound #1 status is Open. Original cause of wound was Blister. The date acquired was: 07/20/2022. The wound has been in treatment 4 weeks. The wound is located on the Right,Lateral Ankle. The wound measures 0.5cm length x 0.7cm width x 0.2cm depth; 0.275cm^2 area and 0.055cm^3 volume. There is Fat Layer (Subcutaneous Tissue) exposed. There is no tunneling or undermining noted. There is a small amount of serosanguineous drainage noted. The wound margin is distinct with the outline attached to the wound base. There is no granulation within the wound bed. There is a large (67-100%) amount of necrotic tissue within the wound bed including Eschar. The periwound skin appearance had no abnormalities noted for texture. The periwound skin appearance had no abnormalities noted for color. The periwound skin appearance did not exhibit: Dry/Scaly, Maceration. Periwound temperature was noted as No  Abnormality. The periwound has tenderness on palpation. 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 There was a Selective/Open Wound Non-Viable Tissue Debridement with a total area of 0.35 sq cm performed by Duanne Guess, MD. With the following instrument(s): Curette to remove Non-Viable tissue/material. Material removed includes Eschar and Slough and. No specimens were taken. A time out was conducted at 13:50, prior to the start of the procedure. A Minimum amount of bleeding was controlled with Pressure. The procedure was tolerated well with a pain level of 0 throughout and a pain level of 0 following the procedure. Post Debridement Measurements: 0.5cm length x  0.7cm width x 0.1cm depth; 0.027cm^3 volume. Character of Wound/Ulcer Post Debridement is improved. Severity of Tissue Post Debridement is: Fat layer exposed. Post procedure Diagnosis Wound #1: Same as Pre-Procedure General Notes: Scribed for Dr. Lady Gary by Zenaida Deed, RN. Pre-procedure diagnosis of Wound #1 is a Venous Leg Ulcer located on the Right,Lateral Ankle . There was a Double Layer Compression Therapy Procedure by Zenaida Deed, RN. Post procedure Diagnosis Wound #1: Same as Pre-Procedure Notes: urgo lite. Plan Follow-up Appointments: Return Appointment in 1 week. - Dr. Lady Gary RM 1 Thursday 4/18 @ 11:15 am Anesthetic: Wound #1 Right,Lateral Ankle: (In clinic) Topical Lidocaine 5% applied to wound bed - prior to debridment Bathing/ Shower/ Hygiene: May shower with protection but do not get wound  dressing(s) wet. Protect dressing(s) with water repellant cover (for example, large plastic bag) or a cast cover and may then take shower. - Please keep dressings clean and dry at all times Edema Control - Lymphedema / SCD / Other: Elevate legs to the level of the heart or above for 30 minutes daily and/or when sitting for 3-4 times a day throughout the day. Avoid standing for long periods of time. Moisturize legs daily. WOUND #1: - Ankle Wound Laterality: Right, Lateral Cleanser: Soap and Water 1 x Per Week/ Discharge Instructions: May shower and wash wound with dial antibacterial soap and water prior to dressing change. Peri-Wound Care: Triamcinolone 15 (g) 1 x Per Week/ Discharge Instructions: Use triamcinolone 15 (g) as directed Peri-Wound Care: Sween Lotion (Moisturizing lotion) 1 x Per Week/ Discharge Instructions: Apply moisturizing lotion as directed Topical: Mupirocin Ointment 1 x Per Week/ Discharge Instructions: Apply Mupirocin (Bactroban) as instructed Prim Dressing: Maxorb Extra Ag+ Alginate Dressing, 2x2 (in/in) 1 x Per Week/ ary Discharge Instructions: Apply to wound bed as instructed Secondary Dressing: Woven Gauze Sponge, Non-Sterile 4x4 in 1 x Per Week/ Discharge Instructions: Apply over primary dressing as directed. Com pression Wrap: Urgo K2 Lite, two layer compression system, regular 1 x Per Week/ Discharge Instructions: Apply Urgo K2 Lite as directed (alternative to 3 layer compression). 12/15/2022: The right lateral ankle wound continues to contract. It is more superficial. There is some slough and eschar that has reaccumulated. Edema control is good. I used a curette to debride the slough and eschar from his wound. I am going to try adding a dab of topical mupirocin to his wound to see if control of surface colonization/skin flora will help push this regard to heal. Continue silver alginate and 3 layer compression. Follow-up in 1 week. Electronic Signature(s) Signed:  12/15/2022 2:21:21 PM By: Duanne Guess MD FACS Entered By: Duanne Guess on 12/15/2022 14:21:21 -------------------------------------------------------------------------------- HxROS Details Patient Name: Date of Service: Mike Day, Mike CK F. 12/15/2022 1:15 PM Medical Record Number: [id number] Patient Account Number: [id number] Date of Birth/Sex: Treating RN: [birth year] (77 y.o. M) Primary Care Provider: Felix Pacini Other Clinician: Referring Provider: Treating Provider/Extender: Nehemiah Massed in Treatment: 4 Information Obtained From Mike Day ([id number]) [id number] Page 7 of 8 Patient Eyes Medical History: Positive for: Cataracts - bila eyes Negative for: Glaucoma; Optic Neuritis Ear/Nose/Mouth/Throat Medical History: Negative for: Chronic sinus problems/congestion; Middle ear problems Hematologic/Lymphatic Medical History: Positive for: Anemia Negative for: Hemophilia; Human Immunodeficiency Virus; Lymphedema; Sickle Cell Disease Respiratory Medical History: Negative for: Aspiration; Asthma; Chronic Obstructive Pulmonary Disease (COPD); Pneumothorax; Sleep Apnea; Tuberculosis Cardiovascular Medical History: Positive for: Arrhythmia - AFIB; Hypertension Gastrointestinal Medical History: Negative for: Cirrhosis ; Colitis; Crohns; Hepatitis A; Hepatitis B; Hepatitis  C Endocrine Medical History: Negative for: Type I Diabetes; Type II Diabetes Genitourinary Medical History: Negative for: End Stage Renal Disease Immunological Medical History: Negative for: Lupus Erythematosus; Raynauds Integumentary (Skin) Medical History: Negative for: History of Burn Musculoskeletal Medical History: Negative for: Gout; Rheumatoid Arthritis; Osteoarthritis; Osteomyelitis Oncologic Medical History: Negative for: Received Chemotherapy; Received Radiation Psychiatric Medical History: Negative for: Anorexia/bulimia;  Confinement Anxiety HBO Extended History Items Eyes: Cataracts Immunizations Pneumococcal Vaccine: Received Pneumococcal Vaccination: Yes Received Pneumococcal Vaccination [address], GRAVOIS ([id number]) [id number] Page 8 of 8 Implantable Devices No devices added Family and Social History Diabetes: Yes - Mother; Heart Disease: Yes - Father,Siblings; Hypertension: Yes - Siblings; Lung Disease: Yes - Father; Stroke: Yes - Mother; Former smoker - ended on 09/06/2007; Marital Status - Married; Alcohol Use: Rarely; Drug Use: No History; Caffeine Use: Daily; Financial Concerns: No; Food, Clothing or Shelter Needs: No; Support System Lacking: No; Transportation Concerns: No Electronic Signature(s) Signed: 12/15/2022 2:26:34 PM By: Duanne Guess MD FACS Entered By: Duanne Guess on 12/15/2022 14:17:35 -------------------------------------------------------------------------------- SuperBill Details Patient Name: Date of Service: Mike Day, Mike CK F. 12/15/2022 Medical Record Number: [id number] Patient Account Number: [id number] Date of Birth/Sex: Treating RN: 03-20-1946 (77 y.o. 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 Facility Procedures : CPT4 Code: [id number] Description: 97597 - DEBRIDE WOUND 1ST 20 SQ CM OR < ICD-10 Diagnosis Description L97.312 Non-pressure chronic ulcer of right ankle with fat layer exposed Modifier: Quantity: 1 Physician Procedures : CPT4 Code Description Modifier [id number] - WC PHYS LEVEL 4 - EST PT 25 ICD-10 Diagnosis Description  L97.312 Non-pressure chronic ulcer of right ankle with fat layer exposed R60.0 Localized edema I48.21 Permanent atrial fibrillation I83.003 Varicose  veins of unspecified lower extremity with ulcer of ankle Quantity: 1 : [id number] 97597 - WC PHYS DEBR WO ANESTH 20 SQ CM ICD-10 Diagnosis Description L97.312 Non-pressure chronic ulcer of right ankle with fat layer exposed Quantity: 1 Electronic Signature(s) Signed: 12/15/2022 2:21:54 PM By: Duanne Guess MD FACS Entered By: Duanne Guess on 12/15/2022 14:21:54

## 2022-12-19 ENCOUNTER — Encounter: Payer: Self-pay | Admitting: *Deleted

## 2022-12-19 DIAGNOSIS — I872 Venous insufficiency (chronic) (peripheral): Secondary | ICD-10-CM | POA: Insufficient documentation

## 2022-12-19 NOTE — Progress Notes (Signed)
Cardiology Office Note:    Date:  12/20/2022  ID:  Mike Day, DOB 1946/05/19, MRN [id number] PCP: Natalia Leatherwood, DO  [facility] Providers Cardiologist:  Tonny Bollman, MD Cardiology APP:  Beatrice Lecher, PA-C      Patient Profile:   AFlutter s/p RFCA Permanent AFib CHADS2-VASc=3 (DM, HTN, age x1). Apixaban Rx PVCs Event monitor 03/2019: AFib, Avg HR 69, PVCs, wide complex rhythm (NSVT vs aberrancy) Monitor 11/2022: 100% AFib, PVCs 4.6% LVH (Left Ventricular Hypertrophy) TTE 08/2013: severe basal septal LVH TTE 03/2019: severe asymmetric LVH, EF 60-65, RVSP 24.6 TTE 10/28/22: EF 60, no RWMA, severe asymmetric LVH, NL RVSF, severe RVE, mild LAE, severe RAE, mild MR, AV sclerosis, asc aorta 41 mm, RAP 8 Carotid Dz [place] 03/2019: Bilat ICA 1-39 [place] 8/21: bilat ICA 1-39 Diabetes mellitus Hypertension  Hyperlipidemia  Prostate CA s/p prostatectomy Aortic atherosclerosis (CT 12/2019) Venous insufficiency      History of Present Illness:   Mike Day is a 77 y.o. male who returns for f/u on leg edema, AFib. He was last seen 11/02/22. I increased his Lasix. He had a monitor done b/c of PVCs on his EKG. PVC burden was 4.6%. He has been followed by the wound clinic for a venous stasis ulcer. He has also been seen at vascular surgery with [place] + for venous reflux. Of note, he has a cardiac MRI pending to further evaluate LVH on his echocardiogram. He is here with his wife. His leg edema is overall stable. His R leg is wrapped. He wears a compression stocking on his L leg. He has not had chest pain, shortness of breath, syncope, orthopnea.   Review of Systems  Gastrointestinal:  Negative for hematochezia and melena.  Genitourinary:  Negative for hematuria.   See HPI    Studies Reviewed:    EKG:  not done   Risk Assessment/Calculations:    CHA2DS2-VASc Score = 5   This indicates a 7.2% annual risk of stroke. The patient's score is based upon: CHF History: 0 HTN History:  1 Diabetes History: 1 Stroke History: 0 Vascular Disease History: 1 Age Score: 2 Gender Score: 0            Physical Exam:   VS:  BP 131/62   Pulse 66   Ht 6\' 3"  (1.905 m)   Wt 197 lb 3.2 oz (89.4 kg)   SpO2 98%   BMI 24.65 kg/m    Wt Readings from Last 3 Encounters:  12/20/22 197 lb 3.2 oz (89.4 kg)  12/08/22 193 lb (87.5 kg)  11/02/22 195 lb 9.6 oz (88.7 kg)    Constitutional:      Appearance: Healthy appearance. Not in distress.  Pulmonary:     Breath sounds: Normal breath sounds. No wheezing. No rales.  Cardiovascular:     Normal rate. Irregularly irregular rhythm. Normal S1. Normal S2.      Murmurs: There is no murmur.  Edema:    Peripheral edema present.    Pretibial: bilateral trace edema of the pretibial area. Abdominal:     Palpations: Abdomen is soft.  Musculoskeletal:     Cervical back: Neck supple.      ASSESSMENT AND PLAN:   Venous insufficiency of both lower extremities Edema is stable. His ulcer is followed by the wound clinic. He has seen vascular surgery. Recent office notes from the wound clinic and vascular surgery have been reviewed by me today. The plan is to proceed with laser ablation  at some point.   Permanent atrial fibrillation (HCC) Rate is controlled. He is tolerating anticoagulation with Eliquis.   LVH (left ventricular hypertrophy) MRI pending in May 2024.  PVC's (premature ventricular contractions) Low burden on monitor in 12/2022. No further testing or medical Rx needed.   Essential hypertension BP is well controlled on a combination of Lasix, Lisinopril. He was taken off of Amlodipine previously b/c of lower ext edema.         Dispo:  Return in about 6 months (around 06/21/2023) for Routine Follow Up, w/ Tereso Newcomer, PA-C.  Signed, Tereso Newcomer, PA-C

## 2022-12-20 ENCOUNTER — Encounter: Payer: Self-pay | Admitting: Physician Assistant

## 2022-12-20 ENCOUNTER — Ambulatory Visit: Payer: Medicare Other | Attending: Physician Assistant | Admitting: Physician Assistant

## 2022-12-20 VITALS — BP 131/62 | HR 66 | Ht 75.0 in | Wt 197.2 lb

## 2022-12-20 DIAGNOSIS — I517 Cardiomegaly: Secondary | ICD-10-CM | POA: Diagnosis not present

## 2022-12-20 DIAGNOSIS — I4821 Permanent atrial fibrillation: Secondary | ICD-10-CM | POA: Diagnosis not present

## 2022-12-20 DIAGNOSIS — I493 Ventricular premature depolarization: Secondary | ICD-10-CM

## 2022-12-20 DIAGNOSIS — I872 Venous insufficiency (chronic) (peripheral): Secondary | ICD-10-CM | POA: Diagnosis not present

## 2022-12-20 DIAGNOSIS — I10 Essential (primary) hypertension: Secondary | ICD-10-CM | POA: Diagnosis not present

## 2022-12-20 NOTE — Assessment & Plan Note (Signed)
MRI pending in May 2024.

## 2022-12-20 NOTE — Assessment & Plan Note (Addendum)
Edema is stable. His ulcer is followed by the wound clinic. He has seen vascular surgery. Recent office notes from the wound clinic and vascular surgery have been reviewed by me today. The plan is to proceed with laser ablation at some point.

## 2022-12-20 NOTE — Patient Instructions (Signed)
Medication Instructions:  Your physician recommends that you continue on your current medications as directed. Please refer to the Current Medication list given to you today.  *If you need a refill on your cardiac medications before your next appointment, please call your pharmacy*   Lab Work: None ordered  If you have labs (blood work) drawn today and your tests are completely normal, you will receive your results only by: MyChart Message (if you have MyChart) OR A paper copy in the mail If you have any lab test that is abnormal or we need to change your treatment, we will call you to review the results.   Testing/Procedures: None ordered   Follow-Up: At [facility], you and your health needs are our priority.  As part of our continuing mission to provide you with exceptional heart care, we have created designated Provider Care Teams.  These Care Teams include your primary Cardiologist (physician) and Advanced Practice Providers (APPs -  Physician Assistants and Nurse Practitioners) who all work together to provide you with the care you need, when you need it.  We recommend signing up for the patient portal called "MyChart".  Sign up information is provided on this After Visit Summary.  MyChart is used to connect with patients for Virtual Visits (Telemedicine).  Patients are able to view lab/test results, encounter notes, upcoming appointments, etc.  Non-urgent messages can be sent to your provider as well.   To learn more about what you can do with MyChart, go to [email or web address].    Your next appointment:   6 month(s)  Provider:   Scott Weaver, PA-C         Other Instructions   

## 2022-12-20 NOTE — Assessment & Plan Note (Signed)
BP is well controlled on a combination of Lasix, Lisinopril. He was taken off of Amlodipine previously b/c of lower ext edema.

## 2022-12-20 NOTE — Assessment & Plan Note (Signed)
Low burden on monitor in 12/2022. No further testing or medical Rx needed.

## 2022-12-20 NOTE — Assessment & Plan Note (Signed)
Rate is controlled. He is tolerating anticoagulation with Eliquis.

## 2022-12-22 ENCOUNTER — Encounter (HOSPITAL_BASED_OUTPATIENT_CLINIC_OR_DEPARTMENT_OTHER): Payer: Medicare Other | Admitting: General Surgery

## 2022-12-22 DIAGNOSIS — I872 Venous insufficiency (chronic) (peripheral): Secondary | ICD-10-CM | POA: Diagnosis not present

## 2022-12-22 DIAGNOSIS — L97812 Non-pressure chronic ulcer of other part of right lower leg with fat layer exposed: Secondary | ICD-10-CM | POA: Diagnosis not present

## 2022-12-22 DIAGNOSIS — L97329 Non-pressure chronic ulcer of left ankle with unspecified severity: Secondary | ICD-10-CM | POA: Diagnosis not present

## 2022-12-22 DIAGNOSIS — L97312 Non-pressure chronic ulcer of right ankle with fat layer exposed: Secondary | ICD-10-CM | POA: Diagnosis not present

## 2022-12-22 DIAGNOSIS — S52022D Displaced fracture of olecranon process without intraarticular extension of left ulna, subsequent encounter for closed fracture with routine healing: Secondary | ICD-10-CM | POA: Diagnosis not present

## 2022-12-22 DIAGNOSIS — I83003 Varicose veins of unspecified lower extremity with ulcer of ankle: Secondary | ICD-10-CM | POA: Diagnosis not present

## 2022-12-22 NOTE — Progress Notes (Signed)
Mike Day, MAKKI ([id number]) [id number] Page 1 of 8 Visit Report for 12/22/2022 Chief Complaint Document Details Patient Name: Date of Service: Mike Heywood Footman CK Day. 12/22/2022 11:15 A M Medical Record Number: [id number] Patient Account Number: [id number] Date of Birth/Sex: Treating RN: Feb 09, 1946 (77 y.o. [name] Primary Care Provider: Felix Pacini Other Clinician: Referring Provider: Treating Provider/Extender: Nada Boozer Weeks in Treatment: 5 Information Obtained from: Patient Chief Complaint Patient presents for treatment of open ulcers due to venous insufficiency Electronic Signature(s) Signed: 12/22/2022 12:05:00 PM By: Duanne Guess MD FACS Entered By: Duanne Guess on 12/22/2022 12:05:00 -------------------------------------------------------------------------------- Debridement Details Patient Name: Date of Service: Mike Day, JA CK Day. 12/22/2022 11:15 A M Medical Record Number: [id number] Patient Account Number: [id number] Date of Birth/Sex: Treating RN: Dec 24, 1945 (77 y.o. [name] Primary Care Provider: Felix Pacini Other Clinician: Referring Provider: Treating Provider/Extender: Nada Boozer Weeks in Treatment: 5 Debridement Performed for Assessment: Wound #1 Right,Lateral Ankle Performed By: Physician Duanne Guess, MD Debridement Type: Debridement Severity of Tissue Pre Debridement: Fat layer exposed Level of Consciousness (Pre-procedure): Awake and Alert Pre-procedure Verification/Time Out Yes - 11:35 Taken: Start Time: 11:37 Pain Control: Lidocaine 4% T opical Solution T Area Debrided (L x W): otal 0.5 (cm) x 0.6 (cm) = 0.3 (cm) Tissue and other material debrided: Non-Viable, Eschar Level: Non-Viable Tissue Debridement Description: Selective/Open Wound Instrument: Curette Bleeding: None Procedural Pain: 0 Post Procedural Pain: 0 Response to Treatment: Procedure  was tolerated well Level of Consciousness (Post- Awake and Alert procedure): Post Debridement Measurements of Total Wound Length: (cm) 0.2 Width: (cm) 0.2 Depth: (cm) 0.1 Volume: (cm) 0.003 Character of Wound/Ulcer Post Debridement: Improved Severity of Tissue Post Debridement: Fat layer exposed Post Procedure Diagnosis Same as Pre-procedure Notes Scribed for Dr. Lady Gary by Zenaida Deed, RN Electronic Signature(s) Signed: 12/22/2022 12:09:09 PM By: Duanne Guess MD FACS Signed: 12/22/2022 3:36:20 PM By: Zenaida Deed RN, BSN Dan Maker ([id number]) [id number] Page 2 of 8 Entered By: Zenaida Deed on 12/22/2022 11:40:18 -------------------------------------------------------------------------------- HPI Details Patient Name: Date of Service: Mike Heywood Footman CK Day. 12/22/2022 11:15 A M Medical Record Number: [id number] Patient Account Number: [id number] Date of Birth/Sex: Treating RN: 07-14-1946 (77 y.o. [name] Primary Care Provider: Felix Pacini Other Clinician: Referring Provider: Treating Provider/Extender: Nada Boozer Weeks in Treatment: 5 History of Present Illness HPI Description: ADMISSION 11/11/2022 This is a 77 year old man who suffered a fall in November 2023. He had a left hip fracture and left olecranon fracture. He had surgical treatment for both of these and was in rehabilitation for some time. As he became more active, he noted that his legs began to swell. He was seen by his primary care provider on February 16. She initiated Lasix 20 mg daily. He was also noted to have ulcers on both ankles. He was referred to the wound care center. While waiting for his appointment, he was seen by cardiology and had an updated echocardiogram performed. While he has severe left atrial enlargement, his ejection fraction is normal. He is not a diabetic. He has been applying bag balm to his ulcers. He does not wear  compression stockings. On his right lateral ankle, there is a semilinear ulcer with a typical stasis ulcer appearance. There is slough and eschar accumulation. On his left medial ankle, just posterior to the malleolus, there is a small circular ulcer with slough and eschar present. No concern for infection at either site.  11/25/2022: The left medial ankle wound is healed. The right lateral ankle wound is smaller. There is some slough and eschar accumulation present. He did have his reflux studies performed and he has significant saphenous vein reflux bilaterally. Edema control is improved. 12/01/2022: The right lateral leg wound is about the same size. Again with slough and eschar accumulation. He has an appointment on April 4 with vascular surgery to discuss saphenous vein ablation. 12/08/2022: The right lateral leg wound is smaller and more superficial. There is a little slough and eschar buildup. He met with vascular surgery yesterday and they are planning saphenous vein ablation in a couple of months. 12/15/2022: The right lateral ankle wound continues to contract. It is more superficial. There is some slough and eschar that has reaccumulated. Edema control is good. 12/22/2022: Under a layer of eschar, the right lateral ankle wound is smaller again this week. Electronic Signature(s) Signed: 12/22/2022 12:05:30 PM By: Duanne Guess MD FACS Entered By: Duanne Guess on 12/22/2022 12:05:30 -------------------------------------------------------------------------------- Physical Exam Details Patient Name: Date of Service: Mike Day, JA CK Day. 12/22/2022 11:15 A M Medical Record Number: [id number] Patient Account Number: [id number] Date of Birth/Sex: Treating RN: 1945-10-08 (77 y.o. [name] Primary Care Provider: Felix Pacini Other Clinician: Referring Provider: Treating Provider/Extender: Rodman Comp, Renee Weeks in Treatment: 5 Constitutional Slightly hypertensive.  Slightly bradycardic. . . no acute distress. Respiratory Normal work of breathing on room air. Notes 12/22/2022: Under a layer of eschar, the right lateral ankle wound is smaller again this week. Electronic Signature(s) Signed: 12/22/2022 12:06:19 PM By: Duanne Guess MD FACS Entered By: Duanne Guess on 12/22/2022 12:06:19 -------------------------------------------------------------------------------- Physician Orders Details Patient Name: Date of Service: Mike Day, JA CK Day. 12/22/2022 11:15 A Vernie Murders ([id number]) [id number] Page 3 of 8 Medical Record Number: [id number] Patient Account Number: [id number] Date of Birth/Sex: Treating RN: 04-20-1946 (77 y.o. 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 - Dr. Lady Gary RM 1 Return A Thursday 4/25 @ 1:15 pm Anesthetic Wound #1 Right,Lateral Ankle (In clinic) Topical Lidocaine 5% applied to wound bed - prior to debridment Bathing/ Shower/ Hygiene May shower with protection but do not get wound dressing(s) wet. Protect dressing(s) with water repellant cover (for example, large plastic bag) or a cast cover and may then take shower. - Please keep dressings clean and dry at all times Edema Control - Lymphedema / SCD / Other Elevate legs to the level of the heart or above for 30 minutes daily and/or when sitting for 3-4 times a day throughout the day. Avoid standing for long periods of time. Moisturize legs daily. Wound Treatment Wound #1 - Ankle  Wound Laterality: Right, Lateral Cleanser: Soap and Water 1 x Per Week Discharge Instructions: May shower and wash wound with dial antibacterial soap and water prior to dressing change. Peri-Wound Care: Triamcinolone 15 (g) 1 x Per Week Discharge Instructions: Use triamcinolone 15 (g) as directed Peri-Wound Care: Sween Lotion (Moisturizing lotion) 1 x Per Week Discharge Instructions: Apply moisturizing lotion as directed Topical: Mupirocin Ointment 1 x Per Week Discharge Instructions: Apply Mupirocin (Bactroban) as instructed Prim Dressing: Maxorb  Extra Ag+ Alginate Dressing, 2x2 (in/in) 1 x Per Week ary Discharge Instructions: Apply to wound bed as instructed Secondary Dressing: Woven Gauze Sponge, Non-Sterile 4x4 in 1 x Per Week Discharge Instructions: Apply over primary dressing as directed. Compression Wrap: Urgo K2 Lite, two layer compression system, regular 1 x Per Week Discharge Instructions: Apply Urgo K2 Lite as directed (alternative to 3 layer compression). Electronic Signature(s) Signed: 12/22/2022 12:09:09 PM By: Duanne Guess MD FACS Entered By: Duanne Guess on 12/22/2022 12:06:31 -------------------------------------------------------------------------------- Problem List Details Patient Name: Date of Service: Mike Day, JA CK Day. 12/22/2022 11:15 A M Medical Record Number: [id number] Patient Account Number: [id number] Date of Birth/Sex: Treating RN: 02/07/1946 (77 y.o. [name] Primary Care Provider: Felix Pacini Other Clinician: Referring Provider: Treating Provider/Extender: Nehemiah Massed in Treatment: [address] 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 12/22/2022 11:15 A M Medical Record Number: [id number] Patient Account Number: [id number] Date of Birth/Sex: Treating RN: [birth year] (77 y.o. [name] Primary Care Provider: Felix Pacini Other Clinician: Referring Provider: Treating Provider/Extender: Nada Boozer Weeks in Treatment: 5 Subjective Chief Complaint Information obtained from Patient Patient presents for treatment of open ulcers due to venous insufficiency History of Present Illness (HPI) ADMISSION 11/11/2022 This is a 77 year old man who suffered a fall in November 2023. He had a left hip fracture and left olecranon fracture. He had surgical treatment for both of these and was in rehabilitation for some time. As he became more active, he noted that his legs began to swell. He was seen by his primary care provider on February 16. She initiated Lasix 20 mg daily. He was also noted to have ulcers on both ankles. He was referred to the wound care center. While waiting for his appointment, he was seen by cardiology and had an updated echocardiogram performed. While he has severe left atrial enlargement, his ejection fraction is normal. He is not a diabetic. He has been applying bag balm to his ulcers. He does not wear compression stockings. On his right lateral ankle, there is a semilinear ulcer with a typical stasis ulcer appearance. There  is slough and eschar accumulation. On his left medial ankle, just posterior to the malleolus, there is a small circular ulcer with slough and eschar present. No concern for infection at either site. 11/25/2022: The left medial ankle wound is healed. The right lateral ankle wound is smaller. There is some slough and eschar accumulation present. He did have his reflux studies performed and he has significant saphenous vein reflux bilaterally. Edema control is improved. 12/01/2022: The right lateral leg wound is about the same size. Again with slough and eschar accumulation. He has an appointment on April 4 with vascular surgery to discuss saphenous vein ablation. 12/08/2022: The right lateral leg wound is smaller and more superficial. There is a  little slough and eschar buildup. He met with vascular surgery yesterday and they are planning saphenous vein ablation in a couple of months. 12/15/2022: The right lateral ankle wound continues to contract. It is more superficial. There is some slough and eschar that has reaccumulated. Edema control is good. Mike Day, Mike Day ([id number]) [id number] Page 5 of 8 12/22/2022: Under a layer of eschar, the right lateral ankle wound is smaller again this week. Patient History Information obtained from Patient. Family History Diabetes - Mother, Heart Disease - Father,Siblings, Hypertension - Siblings, Lung Disease - Father, Stroke - Mother. Social History Former smoker - ended on 09/06/2007, Marital Status - Married, Alcohol Use - Rarely, Drug Use - No History, Caffeine Use - Daily. Medical History Eyes Patient has history of Cataracts - bila eyes Denies history of Glaucoma, Optic Neuritis Ear/Nose/Mouth/Throat Denies history of Chronic sinus problems/congestion, Middle ear problems Hematologic/Lymphatic Patient has history of Anemia Denies history of Hemophilia, Human Immunodeficiency Virus, Lymphedema, Sickle Cell  Disease Respiratory Denies history of Aspiration, Asthma, Chronic Obstructive Pulmonary Disease (COPD), Pneumothorax, Sleep Apnea, Tuberculosis Cardiovascular Patient has history of Arrhythmia - AFIB, Hypertension Gastrointestinal Denies history of Cirrhosis , Colitis, Crohnoos, Hepatitis A, Hepatitis B, Hepatitis C Endocrine Denies history of Type I Diabetes, Type II Diabetes Genitourinary Denies history of End Stage Renal Disease Immunological Denies history of Lupus Erythematosus, Raynaudoos Integumentary (Skin) Denies history of History of Burn Musculoskeletal Denies history of Gout, Rheumatoid Arthritis, Osteoarthritis, Osteomyelitis Oncologic Denies history of Received Chemotherapy, Received Radiation Psychiatric Denies history of Anorexia/bulimia, Confinement Anxiety Objective Constitutional Slightly hypertensive. Slightly bradycardic. no acute distress. Vitals Time Taken: 11:18 AM, Height: 75 in, Weight: 191 lbs, BMI: 23.9, Temperature: 98 Day, Pulse: 58 bpm, Respiratory Rate: 18 breaths/min, Blood Pressure: 148/75 mmHg. Respiratory Normal work of breathing on room air. General Notes: 12/22/2022: Under a layer of eschar, the right lateral ankle wound is smaller again this week. Integumentary (Hair, Skin) Wound #1 status is Open. Original cause of wound was Blister. The date acquired was: 07/20/2022. The wound has been in treatment 5 weeks. The wound is located on the Right,Lateral Ankle. The wound measures 0.5cm length x 0.6cm width x 0.1cm depth; 0.236cm^2 area and 0.024cm^3 volume. There is Fat Layer (Subcutaneous Tissue) exposed. There is no tunneling or undermining noted. There is a small amount of serosanguineous drainage noted. The wound margin is distinct with the outline attached to the wound base. There is no granulation within the wound bed. There is a large (67-100%) amount of necrotic tissue within the wound bed including Eschar. The periwound skin appearance  had no abnormalities noted for texture. The periwound skin appearance had no abnormalities noted for color. The periwound skin appearance did not exhibit: Dry/Scaly, Maceration. Periwound temperature was noted as No Abnormality. The periwound has tenderness on palpation. 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 There was a Selective/Open Wound Non-Viable Tissue Debridement with a total area of 0.3 sq cm performed by Duanne Guess, MD. With the following instrument(s): Curette to remove Non-Viable tissue/material. Material removed includes Eschar after achieving pain control using Lidocaine 4% Topical Solution. No specimens were taken. A time out was conducted at 11:35, prior to  the start of the procedure. There was no bleeding. The procedure was tolerated well with a pain level of 0 throughout and a pain level of 0 following the procedure. Post Debridement Measurements: 0.2cm length x 0.2cm width x 0.1cm depth; 0.003cm^3 volume. Character of Wound/Ulcer Post Debridement is improved. Severity of Tissue Post Debridement is: Fat layer exposed. Post procedure Diagnosis Wound #1: Same as Pre-Procedure General Notes: Scribed for Dr. Lady Gary by Zenaida Deed, RN. Pre-procedure diagnosis of Wound #1 is a Venous Leg Ulcer located on the Right,Lateral Ankle . There was a Double Layer Compression Therapy Procedure by Zenaida Deed, RN. Post procedure Diagnosis Wound #1: Same as Pre-Procedure Notes: urgo lite. Plan Follow-up Appointments: Return Appointment in 1 week. - Dr. Lady Gary  RM 1 Thursday 4/25 @ 1:15 pm Anesthetic: Wound #1 Right,Lateral Ankle: (In clinic) Topical Lidocaine 5% applied to wound bed - prior to debridment Bathing/ Shower/ Hygiene: May shower with protection but do not get wound dressing(s) wet. Protect dressing(s) with water repellant cover (for example, large plastic bag) or a cast cover and may then take shower. - Please keep dressings clean and dry at all times Edema Control - Lymphedema / SCD / Other: Elevate legs to the level of the heart or above for 30 minutes daily and/or when sitting for 3-4 times a day throughout the day. Avoid standing for long periods of time. Moisturize legs daily. WOUND #1: - Ankle Wound Laterality: Right, Lateral Cleanser: Soap and Water 1 x Per Week/ Discharge Instructions: May shower and wash wound with dial antibacterial soap and water prior to dressing change. Peri-Wound Care: Triamcinolone 15 (g) 1 x Per Week/ Discharge Instructions: Use triamcinolone 15 (g) as directed Peri-Wound Care: Sween Lotion (Moisturizing lotion) 1 x Per Week/ Discharge Instructions: Apply moisturizing lotion as directed Topical: Mupirocin Ointment 1 x Per Week/ Discharge Instructions: Apply Mupirocin (Bactroban) as instructed Prim Dressing: Maxorb Extra Ag+ Alginate Dressing, 2x2 (in/in) 1 x Per Week/ ary Discharge Instructions: Apply to wound bed as instructed Secondary Dressing: Woven Gauze Sponge, Non-Sterile 4x4 in 1 x Per Week/ Discharge Instructions: Apply over primary dressing as directed. Com pression Wrap: Urgo K2 Lite, two layer compression system, regular 1 x Per Week/ Discharge Instructions: Apply Urgo K2 Lite as directed (alternative to 3 layer compression). 12/22/2022: Under a layer of eschar, the right lateral ankle wound is smaller again this week. I used a curette to debride slough and eschar from the wound. We will continue topical mupirocin, silver alginate, and 3 layer compression equivalent. Follow- up in 1  week. Electronic Signature(s) Signed: 12/22/2022 12:06:59 PM By: Duanne Guess MD FACS Entered By: Duanne Guess on 12/22/2022 12:06:59 -------------------------------------------------------------------------------- HxROS Details Patient Name: Date of Service: Mike Day, JA CK Day. 12/22/2022 11:15 A M Medical Record Number: [id number] Patient Account Number: [id number] Date of Birth/Sex: Treating RN: [birth year] (77 y.o. [name] Primary Care Provider: Felix Pacini Other Clinician: Referring Provider: Treating Provider/Extender: Nehemiah Massed in Treatment: 5 Information Obtained From Mike Day, Mike Day ([id number]) [id number] Page 7 of 8 Patient Eyes Medical History: Positive for: Cataracts - bila eyes Negative for: Glaucoma; Optic Neuritis Ear/Nose/Mouth/Throat Medical History: Negative for: Chronic sinus problems/congestion; Middle ear problems Hematologic/Lymphatic Medical History: Positive for: Anemia Negative for: Hemophilia; Human Immunodeficiency Virus; Lymphedema; Sickle Cell Disease Respiratory Medical History: Negative for: Aspiration; Asthma; Chronic Obstructive Pulmonary Disease (COPD); Pneumothorax; Sleep Apnea; Tuberculosis Cardiovascular Medical History: Positive for: Arrhythmia - AFIB; Hypertension Gastrointestinal Medical History: Negative for: Cirrhosis ; Colitis; Crohns;  Hepatitis A; Hepatitis B; Hepatitis C Endocrine Medical History: Negative for: Type I Diabetes; Type II Diabetes Genitourinary Medical History: Negative for: End Stage Renal Disease Immunological Medical History: Negative for: Lupus Erythematosus; Raynauds Integumentary (Skin) Medical History: Negative for: History of Burn Musculoskeletal Medical History: Negative for: Gout; Rheumatoid Arthritis; Osteoarthritis; Osteomyelitis Oncologic Medical History: Negative for: Received Chemotherapy; Received  Radiation Psychiatric Medical History: Negative for: Anorexia/bulimia; Confinement Anxiety HBO Extended History Items Eyes: Cataracts Immunizations Pneumococcal Vaccine: Received Pneumococcal Vaccination: Yes Received Pneumococcal Vaccination On or After [address], Mike Day ([id number]) [id number] Page 8 of 8 Implantable Devices No devices added Family and Social History Diabetes: Yes - Mother; Heart Disease: Yes - Father,Siblings; Hypertension: Yes - Siblings; Lung Disease: Yes - Father; Stroke: Yes - Mother; Former smoker - ended on 09/06/2007; Marital Status - Married; Alcohol Use: Rarely; Drug Use: No History; Caffeine Use: Daily; Financial Concerns: No; Food, Clothing or Shelter Needs: No; Support System Lacking: No; Transportation Concerns: No Electronic Signature(s) Signed: 12/22/2022 12:09:09 PM By: Duanne Guess MD FACS Signed: 12/22/2022 3:36:20 PM By: Zenaida Deed RN, BSN Entered By: Duanne Guess on 12/22/2022 12:05:44 -------------------------------------------------------------------------------- SuperBill Details Patient Name: Date of Service: Mike Day, JA CK Day. 12/22/2022 Medical Record Number: [id number] Patient Account Number: [id number] Date of Birth/Sex: Treating RN: 01/21/1946 (77 y.o. 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 Facility Procedures : CPT4 Code: [id number] Description: 97597 - DEBRIDE WOUND 1ST 20 SQ CM OR < ICD-10 Diagnosis Description L97.312 Non-pressure chronic ulcer of right ankle with fat layer  exposed Modifier: Quantity: 1 Physician Procedures : CPT4 Code Description Modifier [id number] - WC PHYS LEVEL 4 - EST PT 25 ICD-10 Diagnosis Description L97.312 Non-pressure chronic ulcer of right ankle with fat layer exposed I83.003 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

## 2022-12-22 NOTE — Progress Notes (Signed)
ALDRED, ESQUIBEL ([id number]) [id number] Page 1 of 7 Visit Report for 12/15/2022 Arrival Information Details Patient Name: Date of Service: Mike Day CK F. 12/15/2022 1:15 PM Medical Record Number: [id number] Patient Account Number: [id number] Date of Birth/Sex: Treating RN: Apr 30, 1946 (77 y.o. Yates Decamp Primary Care Darina Hartwell: Felix Pacini Other Clinician: Referring Audy Dauphine: Treating Marcellino Fidalgo/Extender: Nada Boozer Weeks in Treatment: 4 Visit Information History Since Last Visit All ordered tests and consults were completed: Yes Patient Arrived: Ambulatory Added or deleted any medications: No Arrival Time: 13:25 Any new allergies or adverse reactions: No Accompanied By: wife Had a fall or experienced change in No Transfer Assistance: None activities of daily living that may affect Patient Identification Verified: Yes risk of falls: Secondary Verification Process Completed: Yes Signs or symptoms of abuse/neglect since last visito No Patient Requires Transmission-Based Precautions: No Hospitalized since last visit: No Patient Has Alerts: Yes Implantable device outside of the clinic excluding No Patient Alerts: Patient on Blood Thinner cellular tissue based products placed in the center since last visit: Has Dressing in Place as Prescribed: Yes Pain Present Now: No Electronic Signature(s) Signed: 12/22/2022 1:56:54 PM By: Brenton Grills Entered By: Brenton Grills on 12/15/2022 13:26:55 -------------------------------------------------------------------------------- Compression Therapy Details Patient Name: Date of Service: Mike Day, [name] 12/15/2022 1:15 PM Medical Record Number: [id number] Patient Account Number: [id number] Date of Birth/Sex: Treating RN: [birth year] (77 y.o. [name] Primary Care Marisol Giambra: Felix Pacini Other Clinician: Referring Tyshae Stair: Treating Ava Deguire/Extender: Nada Boozer Weeks in Treatment: 4 Compression Therapy Performed for Wound Assessment: Wound #1 Right,Lateral Ankle Performed By: Clinician Zenaida Deed, RN Compression Type: Double Layer Post Procedure Diagnosis Same as Pre-procedure Notes Stephannie Li Electronic Signature(s) Signed: 12/15/2022 5:21:32 PM By: Zenaida Deed RN, BSN Entered By: Zenaida Deed on 12/15/2022 13:50:42 -------------------------------------------------------------------------------- Encounter Discharge Information Details Patient Name: Date of Service: Mike Day, [name] 12/15/2022 1:15 PM Medical Record Number: [id number] Patient Account Number: [id number] Date of Birth/Sex: Treating RN: 10-23-1945 (77 y.o. [name] Primary Care Tija Biss: Felix Pacini Other Clinician: Referring Artemisia Auvil: Treating Juventino Pavone/Extender: Nehemiah Massed in Treatment: 4 Encounter Discharge Information Items Post Procedure Vitals Discharge Condition: Stable Temperature (F): 98.2 LELAN, JANECZEK ([id number]) [id number] Page 2 of 7 Ambulatory Status: Ambulatory Pulse (bpm): 62 Discharge Destination: Home Respiratory Rate (breaths/min): 18 Transportation: Private Auto Blood Pressure (mmHg): 122/64 Accompanied By: spouse Schedule Follow-up Appointment: Yes Clinical Summary of Care: Patient Declined Electronic Signature(s) Signed: 12/15/2022 5:21:32 PM By: Zenaida Deed RN, BSN Entered By: Zenaida Deed on 12/15/2022 14:09:07 -------------------------------------------------------------------------------- Lower Extremity Assessment Details Patient Name: Date of Service: Mike Day, [name] 12/15/2022 1:15 PM Medical Record Number: [id number] Patient Account Number: [id number] Date of Birth/Sex: Treating RN: 06/17/1946 (77 y.o. Yates Decamp Primary Care Mida Cory: Felix Pacini Other Clinician: Referring Kue Fox: Treating Cuma Polyakov/Extender: Rodman Comp, Renee Weeks in Treatment: 4 Edema Assessment Assessed: [Left: No] [Right: No] Edema: [Left: N] [Right: o] Calf Left: Right: Point of Measurement: From Medial Instep 33.4 cm Ankle Left: Right: Point of Measurement: From Medial Instep 21 cm Vascular Assessment Pulses: Dorsalis Pedis Palpable: [Right:Yes] Electronic Signature(s) Signed: 12/22/2022 1:56:54 PM By: Brenton Grills Entered By: Brenton Grills on 12/15/2022 13:45:56 -------------------------------------------------------------------------------- Multi Wound Chart Details Patient Name: Date of Service: Mike Day, [name] 12/15/2022 1:15 PM Medical Record Number: [id number] Patient Account Number: [id number] Date of Birth/Sex: Treating RN: November 19, 1945 (77 y.o. M) Primary  Care Mike Day: Felix Pacini Other Clinician: Referring Jodene Polyak: Treating Amadea Keagy/Extender: Rodman Comp, Renee Weeks in Treatment: 4 Vital Signs Height(in): 75 Pulse(bpm): 62 Weight(lbs): 191 Blood Pressure(mmHg): 122/64 Body Mass Index(BMI): 23.9 Temperature(F): 98.2 Respiratory Rate(breaths/min): 18 [1:Photos:] [N/A:N/A] Right, Lateral Ankle N/A N/A Wound Location: Blister N/A N/A Wounding Event: Venous Leg Ulcer N/A N/A Primary Etiology: Cataracts, Anemia, Arrhythmia, N/A N/A Comorbid History: Hypertension 07/20/2022 N/A N/A Date Acquired: 4 N/A N/A Weeks of Treatment: Open N/A N/A Wound Status: No N/A N/A Wound Recurrence: 0.5x0.7x0.2 N/A N/A Measurements L x W x D (cm) 0.275 N/A N/A A (cm) : rea 0.055 N/A N/A Volume (cm) : 87.70% N/A N/A % Reduction in A rea: 75.40% N/A N/A % Reduction in Volume: Full Thickness Without Exposed N/A N/A Classification: Support Structures Small N/A N/A Exudate A mount: Serosanguineous N/A N/A Exudate Type: red, brown N/A N/A Exudate Color: Distinct, outline attached N/A N/A Wound Margin: None Present (0%) N/A N/A Granulation A mount: Large (67-100%)  N/A N/A Necrotic A mount: Eschar N/A N/A Necrotic Tissue: Fat Layer (Subcutaneous Tissue): Yes N/A N/A Exposed Structures: Fascia: No Tendon: No Muscle: No Joint: No Bone: No Small (1-33%) N/A N/A Epithelialization: Debridement - Selective/Open Wound N/A N/A Debridement: Pre-procedure Verification/Time Out 13:50 N/A N/A Taken: Necrotic/Eschar, Slough N/A N/A Tissue Debrided: Non-Viable Tissue N/A N/A Level: 0.35 N/A N/A Debridement A (sq cm): rea Curette N/A N/A Instrument: Minimum N/A N/A Bleeding: Pressure N/A N/A Hemostasis A chieved: 0 N/A N/A Procedural Pain: 0 N/A N/A Post Procedural Pain: Procedure was tolerated well N/A N/A Debridement Treatment Response: 0.5x0.7x0.1 N/A N/A Post Debridement Measurements L x W x D (cm) 0.027 N/A N/A Post Debridement Volume: (cm) No Abnormalities Noted N/A N/A Periwound Skin Texture: Maceration: No N/A N/A Periwound Skin Moisture: Dry/Scaly: No No Abnormalities Noted N/A N/A Periwound Skin Color: No Abnormality N/A N/A Temperature: Yes N/A N/A Tenderness on Palpation: Compression Therapy N/A N/A Procedures Performed: Debridement Treatment Notes Wound #1 (Ankle) Wound Laterality: Right, Lateral Cleanser Soap and Water Discharge Instruction: May shower and wash wound with dial antibacterial soap and water prior to dressing change. Peri-Wound Care Triamcinolone 15 (g) Discharge Instruction: Use triamcinolone 15 (g) as directed Sween Lotion (Moisturizing lotion) Discharge Instruction: Apply moisturizing lotion as directed Topical Mupirocin Ointment Discharge Instruction: Apply Mupirocin (Bactroban) as instructed Primary Dressing Maxorb Extra Ag+ Alginate Dressing, 2x2 (in/in) Discharge Instruction: Apply to wound bed as instructed SHADEE, MONTOYA ([id number]) [id number] Page 4 of 7 Secondary Dressing Woven Gauze Sponge, Non-Sterile 4x4 in Discharge Instruction: Apply over primary  dressing as directed. Secured With Compression Wrap Urgo K2 Lite, two layer compression system, regular Discharge Instruction: Apply Urgo K2 Lite as directed (alternative to 3 layer compression). Compression Stockings Add-Ons Electronic Signature(s) Signed: 12/15/2022 2:16:40 PM By: Duanne Guess MD FACS Entered By: Duanne Guess on 12/15/2022 14:16:40 -------------------------------------------------------------------------------- Multi-Disciplinary Care Plan Details Patient Name: Date of Service: Mike Day, [name] 12/15/2022 1:15 PM Medical Record Number: [id number] Patient Account Number: [id number] Date of Birth/Sex: Treating RN: 1946-08-28 (77 y.o. [name] Primary Care Fraya Ueda: Felix Pacini Other Clinician: Referring Elyn Krogh: Treating Casmira Cramer/Extender: Nehemiah Massed in Treatment: 4 Multidisciplinary Care Plan reviewed with physician Active Inactive Nutrition Nursing Diagnoses: Potential for alteratiion in Nutrition/Potential for imbalanced nutrition Goals: Patient/caregiver agrees to and verbalizes understanding of need to use nutritional supplements and/or vitamins as prescribed Date Initiated: 11/11/2022 Target Resolution Date: 01/03/2023 Goal Status: Active Interventions: Assess patient nutrition upon admission and as needed per  policy Treatment Activities: Dietary management education, guidance and counseling : 11/11/2022 Notes: Venous Leg Ulcer Nursing Diagnoses: Potential for venous Insuffiency (use before diagnosis confirmed) Goals: Patient will maintain optimal edema control Date Initiated: 11/11/2022 Target Resolution Date: 01/03/2023 Goal Status: Active Interventions: Compression as ordered Provide education on venous insufficiency Treatment Activities: Therapeutic compression applied : 11/11/2022 Notes: Wound/Skin Impairment Nursing Diagnoses: Mike Day, Mike Day ([id number]) [id number] Page 5  of 7 Impaired tissue integrity Goals: Ulcer/skin breakdown will have a volume reduction of 30% by week 4 Date Initiated: 11/11/2022 Target Resolution Date: 01/03/2023 Goal Status: Active Interventions: Assess ulceration(s) every visit Provide education on smoking Treatment Activities: Skin care regimen initiated : 11/11/2022 Notes: Electronic Signature(s) Signed: 12/15/2022 5:21:32 PM By: Zenaida Deed RN, BSN Entered By: Zenaida Deed on 12/15/2022 13:49:30 -------------------------------------------------------------------------------- Pain Assessment Details Patient Name: Date of Service: Mike Day, [name] 12/15/2022 1:15 PM Medical Record Number: [id number] Patient Account Number: [id number] Date of Birth/Sex: Treating RN: 03/12/1946 (77 y.o. Yates Decamp Primary Care Sydelle Sherfield: Felix Pacini Other Clinician: Referring Malaiya Paczkowski: Treating Arcola Freshour/Extender: Nada Boozer Weeks in Treatment: 4 Active Problems Location of Pain Severity and Description of Pain Patient Has Paino No Site Locations Pain Management and Medication Current Pain Management: Electronic Signature(s) Signed: 12/22/2022 1:56:54 PM By: Brenton Grills Entered By: Brenton Grills on 12/15/2022 13:27:53 -------------------------------------------------------------------------------- Patient/Caregiver Education Details Patient Name: Date of Service: Mike Day, [name] 4/11/2024andnbsp1:15 PM Medical Record Number: [id number] Patient Account Number: [id number] Date of Birth/Gender: Treating RN: [birth year] (77 y.o. [name] Primary Care Physician: Felix Pacini Other Clinician: Referring Physician: Treating Physician/Extender: Nehemiah Massed in Treatment: [address] GREYSEN, DEVINO F ([id number]) [id number] Page 6 of 7 Education Assessment Education Provided To: Patient Education Topics Provided Venous: Methods:  Explain/Verbal Responses: Reinforcements needed, State content correctly Electronic Signature(s) Signed: 12/15/2022 5:21:32 PM By: Zenaida Deed RN, BSN Entered By: Zenaida Deed on 12/15/2022 13:50:09 -------------------------------------------------------------------------------- Wound Assessment Details Patient Name: Date of Service: Mike Day, [name] 12/15/2022 1:15 PM Medical Record Number: [id number] Patient Account Number: [id number] Date of Birth/Sex: Treating RN: 1945-10-23 (77 y.o. Yates Decamp Primary Care Tan Clopper: Felix Pacini Other Clinician: Referring Biff Rutigliano: Treating Kenn Rekowski/Extender: Nada Boozer Weeks in Treatment: 4 Wound Status Wound Number: 1 Primary Etiology: Venous Leg Ulcer Wound Location: Right, Lateral Ankle Wound Status: Open Wounding Event: Blister Comorbid History: Cataracts, Anemia, Arrhythmia, Hypertension Date Acquired: 07/20/2022 Weeks Of Treatment: 4 Clustered Wound: No Photos Wound Measurements Length: (cm) 0.5 Width: (cm) 0.7 Depth: (cm) 0.2 Area: (cm) 0.275 Volume: (cm) 0.055 % Reduction in Area: 87.7% % Reduction in Volume: 75.4% Epithelialization: Small (1-33%) Tunneling: No Undermining: No Wound Description Classification: Full Thickness Without Exposed Support Structures Wound Margin: Distinct, outline attached Exudate Amount: Small Exudate Type: Serosanguineous Exudate Color: red, brown Foul Odor After Cleansing: No Slough/Fibrino No Wound Bed Granulation Amount: None Present (0%) Exposed Structure Necrotic Amount: Large (67-100%) Fascia Exposed: No Necrotic Quality: Eschar Fat Layer (Subcutaneous Tissue) Exposed: Yes Tendon Exposed: No Muscle Exposed: No Joint Exposed: No Bone Exposed: No Mike Day, Mike Day ([id number]) [id number] Page 7 of 7 Periwound Skin Texture Texture Color No Abnormalities Noted: Yes No Abnormalities Noted: Yes Moisture Temperature /  Pain No Abnormalities Noted: No Temperature: No Abnormality Dry / Scaly: No Tenderness on Palpation: Yes Maceration: No Electronic Signature(s) Signed: 12/22/2022 1:56:54 PM By: Brenton Grills Entered By: Brenton Grills on 12/15/2022 13:46:14 -------------------------------------------------------------------------------- Vitals Details Patient Name: Date of Service: Mike Day  DMA N, [name] 12/15/2022 1:15 PM Medical Record Number: [id number] Patient Account Number: [id number] Date of Birth/Sex: Treating RN: [birth year] (77 y.o. Yates Decamp Primary Care Alexius Ellington: Felix Pacini Other Clinician: Referring Delwin Raczkowski: Treating Caedyn Raygoza/Extender: Nada Boozer Weeks in Treatment: 4 Vital Signs Time Taken: 13:26 Temperature (F): 98.2 Height (in): 75 Pulse (bpm): 62 Weight (lbs): 191 Respiratory Rate (breaths/min): 18 Body Mass Index (BMI): 23.9 Blood Pressure (mmHg): 122/64 Reference Range: 80 - 120 mg / dl Electronic Signature(s) Signed: 12/22/2022 1:56:54 PM By: Brenton Grills Entered By: Brenton Grills on 12/15/2022 13:27:44

## 2022-12-22 NOTE — Progress Notes (Signed)
Mike Day, Mike Day ([id number]) [id number] Page 1 of 8 Visit Report for 12/22/2022 Arrival Information Details Patient Name: Date of Service: [name] CK F. 12/22/2022 11:15 A M Medical Record Number: [id number] Patient Account Number: [id number] Date of Birth/Sex: Treating RN: 11-Mar-1946 (77 y.o. Mike Day Primary Care Trampas Stettner: Felix Pacini Other Clinician: Referring Evelisse Szalkowski: Treating Evalina Tabak/Extender: Nada Boozer Weeks in Treatment: 5 Visit Information History Since Last Visit Added or deleted any medications: No Patient Arrived: Ambulatory Any new allergies or adverse reactions: No Arrival Time: 11:17 Had a fall or experienced change in No Accompanied By: spouse activities of daily living that may affect Transfer Assistance: None risk of falls: Patient Identification Verified: Yes Signs or symptoms of abuse/neglect since last visito No Secondary Verification Process Completed: Yes Hospitalized since last visit: No Patient Requires Transmission-Based Precautions: No Implantable device outside of the clinic excluding No Patient Has Alerts: Yes cellular tissue based products placed in the center Patient Alerts: Patient on Blood Thinner since last visit: Has Dressing in Place as Prescribed: Yes Has Compression in Place as Prescribed: Yes Pain Present Now: No Electronic Signature(s) Signed: 12/22/2022 3:36:20 PM By: Zenaida Deed RN, BSN Entered By: Zenaida Deed on 12/22/2022 11:18:13 -------------------------------------------------------------------------------- Compression Therapy Details Patient Name: Date of Service: Mike Day, Mike CK F. 12/22/2022 11:15 A M Medical Record Number: [id number] Patient Account Number: [id number] Date of Birth/Sex: Treating RN: Jan 13, 1946 (77 y.o. Mike Day Primary Care Norah Devin: Felix Pacini Other Clinician: Referring Jenessa Gillingham: Treating Jamila Slatten/Extender: Nada Boozer Weeks in Treatment: 5 Compression Therapy Performed for Wound Assessment: Wound #1 Right,Lateral Ankle Performed By: Clinician Zenaida Deed, RN Compression Type: Double Layer Post Procedure Diagnosis Same as Pre-procedure Notes urgo lite Electronic Signature(s) Signed: 12/22/2022 3:36:20 PM By: Zenaida Deed RN, BSN Entered By: Zenaida Deed on 12/22/2022 11:37:08 -------------------------------------------------------------------------------- Encounter Discharge Information Details Patient Name: Date of Service: Mike Day, Mike CK F. 12/22/2022 11:15 A M Medical Record Number: [id number] Patient Account Number: [id number] Date of Birth/Sex: Treating RN: 09/11/1945 (77 y.o. Mike Day Primary Care Curtez Brallier: Felix Pacini Other Clinician: Referring Maurie Musco: Treating Barney Gertsch/Extender: Nehemiah Massed in Treatment: 5 Encounter Discharge Information Items Post Procedure Vitals Discharge Condition: Stable Temperature (F): [address] F ([id number]) [id number] Page 2 of 8 Ambulatory Status: Ambulatory Pulse (bpm): 58 Discharge Destination: Home Respiratory Rate (breaths/min): 18 Transportation: Private Auto Blood Pressure (mmHg): 148/75 Accompanied By: spouse Schedule Follow-up Appointment: Yes Clinical Summary of Care: Patient Declined Electronic Signature(s) Signed: 12/22/2022 3:36:20 PM By: Zenaida Deed RN, BSN Entered By: Zenaida Deed on 12/22/2022 11:56:37 -------------------------------------------------------------------------------- Lower Extremity Assessment Details Patient Name: Date of Service: GO Mike Day, Mike CK F. 12/22/2022 11:15 A M Medical Record Number: [id number] Patient Account Number: [id number] Date of Birth/Sex: Treating RN: [birth year] (77 y.o. Mike Day Primary Care Akeia Perot: Felix Pacini Other Clinician: Referring Loyalty Brashier: Treating Even Budlong/Extender:  Rodman Comp, Renee Weeks in Treatment: 5 Edema Assessment Assessed: [Left: No] [Right: No] Edema: [Left: N] [Right: o] Calf Left: Right: Point of Measurement: From Medial Instep 32.5 cm Ankle Left: Right: Point of Measurement: From Medial Instep 21.3 cm Vascular Assessment Pulses: Dorsalis Pedis Palpable: [Right:Yes] Electronic Signature(s) Signed: 12/22/2022 3:36:20 PM By: Zenaida Deed RN, BSN Entered By: Zenaida Deed on 12/22/2022 11:23:03 -------------------------------------------------------------------------------- Multi Wound Chart Details Patient Name: Date of Service: Mike Day, Mike CK F. 12/22/2022 11:15 A M Medical Record Number: [id number] Patient Account Number: [id number] Date of  Birth/Sex: Treating RN: 06-Apr-1946 (77 y.o. Mike Day Primary Care Veena Sturgess: Felix Pacini Other Clinician: Referring Jari Dipasquale: Treating Dorrien Grunder/Extender: Rodman Comp, Renee Weeks in Treatment: 5 Vital Signs Height(in): 75 Pulse(bpm): 58 Weight(lbs): 191 Blood Pressure(mmHg): 148/75 Body Mass Index(BMI): 23.9 Temperature(F): 98 Respiratory Rate(breaths/min): 18 [1:Photos:] [N/A:N/A] Right, Lateral Ankle N/A N/A Wound Location: Blister N/A N/A Wounding Event: Venous Leg Ulcer N/A N/A Primary Etiology: Cataracts, Anemia, Arrhythmia, N/A N/A Comorbid History: Hypertension 07/20/2022 N/A N/A Date Acquired: 5 N/A N/A Weeks of Treatment: Open N/A N/A Wound Status: No N/A N/A Wound Recurrence: 0.5x0.6x0.1 N/A N/A Measurements L x W x D (cm) 0.236 N/A N/A A (cm) : rea 0.024 N/A N/A Volume (cm) : 89.50% N/A N/A % Reduction in A rea: 89.30% N/A N/A % Reduction in Volume: Full Thickness Without Exposed N/A N/A Classification: Support Structures Small N/A N/A Exudate A mount: Serosanguineous N/A N/A Exudate Type: red, brown N/A N/A Exudate Color: Distinct, outline attached N/A N/A Wound Margin: None Present (0%) N/A  N/A Granulation A mount: Large (67-100%) N/A N/A Necrotic A mount: Eschar N/A N/A Necrotic Tissue: Fat Layer (Subcutaneous Tissue): Yes N/A N/A Exposed Structures: Fascia: No Tendon: No Muscle: No Joint: No Bone: No Medium (34-66%) N/A N/A Epithelialization: Debridement - Selective/Open Wound N/A N/A Debridement: Pre-procedure Verification/Time Out 11:35 N/A N/A Taken: Lidocaine 4% Topical Solution N/A N/A Pain Control: Necrotic/Eschar N/A N/A Tissue Debrided: Non-Viable Tissue N/A N/A Level: 0.3 N/A N/A Debridement A (sq cm): rea Curette N/A N/A Instrument: None N/A N/A Bleeding: 0 N/A N/A Procedural Pain: 0 N/A N/A Post Procedural Pain: Procedure was tolerated well N/A N/A Debridement Treatment Response: 0.2x0.2x0.1 N/A N/A Post Debridement Measurements L x W x D (cm) 0.003 N/A N/A Post Debridement Volume: (cm) No Abnormalities Noted N/A N/A Periwound Skin Texture: Maceration: No N/A N/A Periwound Skin Moisture: Dry/Scaly: No No Abnormalities Noted N/A N/A Periwound Skin Color: No Abnormality N/A N/A Temperature: Yes N/A N/A Tenderness on Palpation: Compression Therapy N/A N/A Procedures Performed: Debridement Treatment Notes Wound #1 (Ankle) Wound Laterality: Right, Lateral Cleanser Soap and Water Discharge Instruction: May shower and wash wound with dial antibacterial soap and water prior to dressing change. Peri-Wound Care Triamcinolone 15 (g) Discharge Instruction: Use triamcinolone 15 (g) as directed Sween Lotion (Moisturizing lotion) Discharge Instruction: Apply moisturizing lotion as directed Topical Mupirocin Ointment Discharge Instruction: Apply Mupirocin (Bactroban) as instructed Primary Dressing Maxorb Extra Ag+ Alginate Dressing, 2x2 (in/in) Discharge Instruction: Apply to wound bed as instructed ALOK, MINSHALL ([id number]) [id number] Page 4 of 8 Secondary Dressing Woven Gauze Sponge, Non-Sterile 4x4  in Discharge Instruction: Apply over primary dressing as directed. Secured With Compression Wrap Urgo K2 Lite, two layer compression system, regular Discharge Instruction: Apply Urgo K2 Lite as directed (alternative to 3 layer compression). Compression Stockings Add-Ons Electronic Signature(s) Signed: 12/22/2022 12:04:54 PM By: Duanne Guess MD FACS Signed: 12/22/2022 3:36:20 PM By: Zenaida Deed RN, BSN Entered By: Duanne Guess on 12/22/2022 12:04:54 -------------------------------------------------------------------------------- Multi-Disciplinary Care Plan Details Patient Name: Date of Service: Mike Day, Mike CK F. 12/22/2022 11:15 A M Medical Record Number: [id number] Patient Account Number: [id number] Date of Birth/Sex: Treating RN: 10-14-1945 (77 y.o. Mike Day Primary Care Glenroy Crossen: Felix Pacini Other Clinician: Referring Juliann Olesky: Treating Dhani Imel/Extender: Nehemiah Massed in Treatment: 5 Multidisciplinary Care Plan reviewed with physician Active Inactive Nutrition Nursing Diagnoses: Potential for alteratiion in Nutrition/Potential for imbalanced nutrition Goals: Patient/caregiver agrees to and verbalizes understanding of need to use nutritional supplements and/or vitamins as  prescribed Date Initiated: 11/11/2022 Target Resolution Date: 01/03/2023 Goal Status: Active Interventions: Assess patient nutrition upon admission and as needed per policy Treatment Activities: Dietary management education, guidance and counseling : 11/11/2022 Notes: Venous Leg Ulcer Nursing Diagnoses: Potential for venous Insuffiency (use before diagnosis confirmed) Goals: Patient will maintain optimal edema control Date Initiated: 11/11/2022 Target Resolution Date: 01/03/2023 Goal Status: Active Interventions: Compression as ordered Provide education on venous insufficiency Treatment Activities: Therapeutic compression applied : 11/11/2022 Notes: Wound/Skin  Impairment ASH, MCELWAIN ([id number]) [id number] Page 5 of 8 Nursing Diagnoses: Impaired tissue integrity Goals: Ulcer/skin breakdown will have a volume reduction of 30% by week 4 Date Initiated: 11/11/2022 Target Resolution Date: 01/03/2023 Goal Status: Active Interventions: Assess ulceration(s) every visit Provide education on smoking Treatment Activities: Skin care regimen initiated : 11/11/2022 Notes: Electronic Signature(s) Signed: 12/22/2022 3:36:20 PM By: Zenaida Deed RN, BSN Entered By: Zenaida Deed on 12/22/2022 11:30:53 -------------------------------------------------------------------------------- Pain Assessment Details Patient Name: Date of Service: Mike Day, Mike CK F. 12/22/2022 11:15 A M Medical Record Number: [id number] Patient Account Number: [id number] Date of Birth/Sex: Treating RN: 12-13-1945 (77 y.o. Mike Day Primary Care Lynsi Dooner: Felix Pacini Other Clinician: Referring Tejuan Gholson: Treating Jesiah Grismer/Extender: Nada Boozer Weeks in Treatment: 5 Active Problems Location of Pain Severity and Description of Pain Patient Has Paino No Site Locations Rate the pain. Current Pain Level: 0 Pain Management and Medication Current Pain Management: Electronic Signature(s) Signed: 12/22/2022 3:36:20 PM By: Zenaida Deed RN, BSN Entered By: Zenaida Deed on 12/22/2022 11:19:07 -------------------------------------------------------------------------------- Patient/Caregiver Education Details Patient Name: Date of Service: GO Mike Day, Mike CK F. 4/18/2024andnbsp11:15 A M Medical Record Number: [id number] Patient Account Number: [id number] Date of Birth/Gender: Treating RN: [birth year] (77 y.o. Mike Day Primary Care Physician: Felix Pacini Other Clinician: Referring Physician: Treating Physician/Extender: Nehemiah Massed in Treatment: [address] ERIKSON, DANZY F ([id number])  [id number] Page 6 of 8 Education Assessment Education Provided To: Patient Education Topics Provided Venous: Methods: Explain/Verbal Responses: Reinforcements needed, State content correctly Wound/Skin Impairment: Methods: Explain/Verbal Responses: Reinforcements needed, State content correctly Electronic Signature(s) Signed: 12/22/2022 3:36:20 PM By: Zenaida Deed RN, BSN Entered By: Zenaida Deed on 12/22/2022 11:31:17 -------------------------------------------------------------------------------- Wound Assessment Details Patient Name: Date of Service: Mike Day, Mike CK F. 12/22/2022 11:15 A M Medical Record Number: [id number] Patient Account Number: [id number] Date of Birth/Sex: Treating RN: 04-21-1946 (77 y.o. Mike Day Primary Care Tychelle Purkey: Felix Pacini Other Clinician: Referring Mumin Denomme: Treating Lanisa Ishler/Extender: Rodman Comp, Renee Weeks in Treatment: 5 Wound Status Wound Number: 1 Primary Etiology: Venous Leg Ulcer Wound Location: Right, Lateral Ankle Wound Status: Open Wounding Event: Blister Comorbid History: Cataracts, Anemia, Arrhythmia, Hypertension Date Acquired: 07/20/2022 Weeks Of Treatment: 5 Clustered Wound: No Photos Wound Measurements Length: (cm) 0.5 Width: (cm) 0.6 Depth: (cm) 0.1 Area: (cm) 0.236 Volume: (cm) 0.024 % Reduction in Area: 89.5% % Reduction in Volume: 89.3% Epithelialization: Medium (34-66%) Tunneling: No Undermining: No Wound Description Classification: Full Thickness Without Exposed Support Structures Wound Margin: Distinct, outline attached Exudate Amount: Small Exudate Type: Serosanguineous Exudate Color: red, brown Foul Odor After Cleansing: No Slough/Fibrino No Wound Bed Granulation Amount: None Present (0%) Exposed Structure Necrotic Amount: Large (67-100%) Fascia Exposed: No Mike Day, Mike Day ([id number]) [id number] Page 7 of 8 Necrotic  Quality: Eschar Fat Layer (Subcutaneous Tissue) Exposed: Yes Tendon Exposed: No Muscle Exposed: No Joint Exposed: No Bone Exposed: No Periwound Skin Texture Texture Color No Abnormalities Noted: Yes No Abnormalities Noted: Yes Moisture Temperature / Pain  No Abnormalities Noted: No Temperature: No Abnormality Dry / Scaly: No Tenderness on Palpation: Yes Maceration: No Treatment Notes Wound #1 (Ankle) Wound Laterality: Right, Lateral Cleanser Soap and Water Discharge Instruction: May shower and wash wound with dial antibacterial soap and water prior to dressing change. Peri-Wound Care Triamcinolone 15 (g) Discharge Instruction: Use triamcinolone 15 (g) as directed Sween Lotion (Moisturizing lotion) Discharge Instruction: Apply moisturizing lotion as directed Topical Mupirocin Ointment Discharge Instruction: Apply Mupirocin (Bactroban) as instructed Primary Dressing Maxorb Extra Ag+ Alginate Dressing, 2x2 (in/in) Discharge Instruction: Apply to wound bed as instructed Secondary Dressing Woven Gauze Sponge, Non-Sterile 4x4 in Discharge Instruction: Apply over primary dressing as directed. Secured With Compression Wrap Urgo K2 Lite, two layer compression system, regular Discharge Instruction: Apply Urgo K2 Lite as directed (alternative to 3 layer compression). Compression Stockings Add-Ons Electronic Signature(s) Signed: 12/22/2022 3:36:20 PM By: Zenaida Deed RN, BSN Entered By: Zenaida Deed on 12/22/2022 11:29:24 -------------------------------------------------------------------------------- Vitals Details Patient Name: Date of Service: Mike Day, Mike CK F. 12/22/2022 11:15 A M Medical Record Number: [id number] Patient Account Number: [id number] Date of Birth/Sex: Treating RN: [birth year] (77 y.o. Mike Day Primary Care Jayvin Hurrell: Felix Pacini Other Clinician: Referring Harlo Fabela: Treating Palmira Stickle/Extender: Rodman Comp, Renee Weeks in  Treatment: 5 Vital Signs Time Taken: 11:18 Temperature (F): 98 Height (in): 75 Pulse (bpm): 58 Weight (lbs): 191 Respiratory Rate (breaths/min): 18 Body Mass Index (BMI): 23.9 Blood Pressure (mmHg): 148/75 Reference Range: 80 - 120 mg / dl ANDREI, MCCOOK ([id number]) [id number] Page 8 of 8 Electronic Signature(s) Signed: 12/22/2022 3:36:20 PM By: Zenaida Deed RN, BSN Entered By: Zenaida Deed on 12/22/2022 11:19:20

## 2022-12-29 ENCOUNTER — Encounter (HOSPITAL_BASED_OUTPATIENT_CLINIC_OR_DEPARTMENT_OTHER): Payer: Medicare Other | Admitting: General Surgery

## 2022-12-29 DIAGNOSIS — L97329 Non-pressure chronic ulcer of left ankle with unspecified severity: Secondary | ICD-10-CM | POA: Diagnosis not present

## 2022-12-29 DIAGNOSIS — S52022D Displaced fracture of olecranon process without intraarticular extension of left ulna, subsequent encounter for closed fracture with routine healing: Secondary | ICD-10-CM | POA: Diagnosis not present

## 2022-12-29 DIAGNOSIS — L97812 Non-pressure chronic ulcer of other part of right lower leg with fat layer exposed: Secondary | ICD-10-CM | POA: Diagnosis not present

## 2022-12-29 DIAGNOSIS — I83003 Varicose veins of unspecified lower extremity with ulcer of ankle: Secondary | ICD-10-CM | POA: Diagnosis not present

## 2022-12-29 DIAGNOSIS — I872 Venous insufficiency (chronic) (peripheral): Secondary | ICD-10-CM | POA: Diagnosis not present

## 2022-12-29 DIAGNOSIS — L97312 Non-pressure chronic ulcer of right ankle with fat layer exposed: Secondary | ICD-10-CM | POA: Diagnosis not present

## 2022-12-30 NOTE — Progress Notes (Signed)
RAYMONDO, GARCIALOPEZ ([id number]) [id number] Page 1 of 6 Visit Report for 12/29/2022 Arrival Information Details Patient Name: Date of Service: Mike Lager CK Day. 12/29/2022 1:15 PM Medical Record Number: [id number] Patient Account Number: [id number] Date of Birth/Sex: Treating RN: 01/05/1946 (77 y.o. Cline Cools Primary Care Brandell Maready: Felix Pacini Other Clinician: Referring Darrie Macmillan: Treating Sherrika Weakland/Extender: Nada Boozer Weeks in Treatment: 6 Visit Information History Since Last Visit Added or deleted any medications: No Patient Arrived: Ambulatory Any new allergies or adverse reactions: No Arrival Time: 13:26 Had a fall or experienced change in No Accompanied By: wife activities of daily living that may affect Transfer Assistance: None risk of falls: Patient Identification Verified: Yes Signs or symptoms of abuse/neglect since last visito No Secondary Verification Process Completed: Yes Hospitalized since last visit: No Patient Requires Transmission-Based Precautions: No Implantable device outside of the clinic excluding No Patient Has Alerts: Yes cellular tissue based products placed in the center Patient Alerts: Patient on Blood Thinner since last visit: Has Dressing in Place as Prescribed: Yes Has Compression in Place as Prescribed: Yes Pain Present Now: No Electronic Signature(s) Signed: 12/29/2022 3:29:20 PM By: Redmond Pulling RN, BSN Entered By: Redmond Pulling on 12/29/2022 13:26:50 -------------------------------------------------------------------------------- Encounter Discharge Information Details Patient Name: Date of Service: Mike Day, Mike CK Day. 12/29/2022 1:15 PM Medical Record Number: [id number] Patient Account Number: [id number] Date of Birth/Sex: Treating RN: 14-Mar-1946 (77 y.o. Cline Cools Primary Care Salinda Snedeker: Felix Pacini Other Clinician: Referring Stephens Shreve: Treating Apryle Stowell/Extender: Nada Boozer Weeks in Treatment: 6 Encounter Discharge Information Items Post Procedure Vitals Discharge Condition: Stable Temperature (Day): 98.9 Ambulatory Status: Ambulatory Pulse (bpm): 69 Discharge Destination: Home Respiratory Rate (breaths/min): 18 Transportation: Private Auto Blood Pressure (mmHg): 137/81 Accompanied By: wife Schedule Follow-up Appointment: Yes Clinical Summary of Care: Patient Declined Electronic Signature(s) Signed: 12/29/2022 3:29:20 PM By: Redmond Pulling RN, BSN Entered By: Redmond Pulling on 12/29/2022 14:02:11 -------------------------------------------------------------------------------- Lower Extremity Assessment Details Patient Name: Date of Service: Mike Day, Mike CK Day. 12/29/2022 1:15 PM Medical Record Number: [id number] Patient Account Number: [id number] Date of Birth/Sex: Treating RN: 12/21/1945 (77 y.o. Cline Cools Primary Care Brynnleigh Mcelwee: Felix Pacini Other Clinician: Referring Leauna Sharber: Treating Emmilee Reamer/Extender: Rodman Comp, Renee Weeks in Treatment: 6 Edema Assessment G[LeftRALLY, OUCH [phone] [Right: [id number] Page 2 of 6] Assessed: [Left: No] [Right: No] Edema: [Left: N] [Right: o] Calf Left: Right: Point of Measurement: From Medial Instep 33 cm Ankle Left: Right: Point of Measurement: From Medial Instep 21 cm Vascular Assessment Pulses: Dorsalis Pedis Palpable: [Right:Yes] Electronic Signature(s) Signed: 12/29/2022 3:29:20 PM By: Redmond Pulling RN, BSN Entered By: Redmond Pulling on 12/29/2022 13:26:23 -------------------------------------------------------------------------------- Multi Wound Chart Details Patient Name: Date of Service: Mike Day, Mike CK Day. 12/29/2022 1:15 PM Medical Record Number: [id number] Patient Account Number: [id number] Date of Birth/Sex: Treating RN: 10-01-1945 (77 y.o. M) Primary Care Kenyette Gundy: Felix Pacini Other Clinician: Referring  Gatlin Kittell: Treating Xandrea Clarey/Extender: Rodman Comp, Renee Weeks in Treatment: 6 Vital Signs Height(in): 75 Pulse(bpm): 69 Weight(lbs): 191 Blood Pressure(mmHg): 137/81 Body Mass Index(BMI): 23.9 Temperature(Day): 98.9 Respiratory Rate(breaths/min): 18 [1:Photos:] [N/A:N/A] Right, Lateral Ankle N/A N/A Wound Location: Blister N/A N/A Wounding Event: Venous Leg Ulcer N/A N/A Primary Etiology: Cataracts, Anemia, Arrhythmia, N/A N/A Comorbid History: Hypertension 07/20/2022 N/A N/A Date Acquired: 6 N/A N/A Weeks of Treatment: Healed - Epithelialized N/A N/A Wound Status: No N/A N/A Wound Recurrence: 0x0x0 N/A N/A Measurements L x W x D (  cm) 0 N/A N/A A (cm) : rea 0 N/A N/A Volume (cm) : 100.00% N/A N/A % Reduction in Area: 100.00% N/A N/A % Reduction in Volume: Full Thickness Without Exposed N/A N/A Classification: Support Structures None Present N/A N/A Exudate Amount: Distinct, outline attached N/A N/A Wound Margin: None Present (0%) N/A N/A Granulation Amount: None Present (0%) N/A N/A Necrotic Amount: Fascia: No N/A N/A Exposed Structures: Fat Layer (Subcutaneous Tissue): No Tendon: No Muscle: No Mike Day, Mike Day ([id number]) [id number] Page 3 of 6 Joint: No Bone: No Large (67-100%) N/A N/A Epithelialization: Debridement - Selective/Open Wound N/A N/A Debridement: Pre-procedure Verification/Time Out 13:50 N/A N/A Taken: Lidocaine 5% topical ointment N/A N/A Pain Control: Necrotic/Eschar N/A N/A Tissue Debrided: Non-Viable Tissue N/A N/A Level: 0.03 N/A N/A Debridement A (sq cm): rea Curette N/A N/A Instrument: Minimum N/A N/A Bleeding: Pressure N/A N/A Hemostasis A chieved: 0 N/A N/A Procedural Pain: 0 N/A N/A Post Procedural Pain: Procedure was tolerated well N/A N/A Debridement Treatment Response: 0.2x0.2x0.1 N/A N/A Post Debridement Measurements L x W x D (cm) 0.003 N/A N/A Post  Debridement Volume: (cm) No Abnormalities Noted N/A N/A Periwound Skin Texture: Maceration: No N/A N/A Periwound Skin Moisture: Dry/Scaly: No No Abnormalities Noted N/A N/A Periwound Skin Color: No Abnormality N/A N/A Temperature: Yes N/A N/A Tenderness on Palpation: Debridement N/A N/A Procedures Performed: Treatment Notes Electronic Signature(s) Signed: 12/29/2022 2:16:15 PM By: Duanne Guess MD FACS Entered By: Duanne Guess on 12/29/2022 14:16:15 -------------------------------------------------------------------------------- Multi-Disciplinary Care Plan Details Patient Name: Date of Service: Mike Day, Mike CK Day. 12/29/2022 1:15 PM Medical Record Number: [id number] Patient Account Number: [id number] Date of Birth/Sex: Treating RN: 1945-10-18 (77 y.o. Cline Cools Primary Care Denali Becvar: Felix Pacini Other Clinician: Referring Montasia Chisenhall: Treating Ryker Pherigo/Extender: Nehemiah Massed in Treatment: 6 Multidisciplinary Care Plan reviewed with physician Active Inactive Electronic Signature(s) Signed: 12/29/2022 3:29:20 PM By: Redmond Pulling RN, BSN Entered By: Redmond Pulling on 12/29/2022 14:03:28 -------------------------------------------------------------------------------- Pain Assessment Details Patient Name: Date of Service: Mike Day, Mike CK Day. 12/29/2022 1:15 PM Medical Record Number: [id number] Patient Account Number: [id number] Date of Birth/Sex: Treating RN: 10/04/1945 (77 y.o. Cline Cools Primary Care Margaux Engen: Felix Pacini Other Clinician: Referring Geneieve Duell: Treating Jaedin Regina/Extender: Nada Boozer Weeks in Treatment: 6 Active Problems Location of Pain Severity and Description of Pain Patient Has Paino No Site Locations Mike, THIEME Day ([id number]) [id number] Page 4 of 6 Pain Management and Medication Current Pain Management: Electronic Signature(s) Signed: 12/29/2022 3:29:20 PM  By: Redmond Pulling RN, BSN Entered By: Redmond Pulling on 12/29/2022 13:27:18 -------------------------------------------------------------------------------- Patient/Caregiver Education Details Patient Name: Date of Service: Mike Day, Mike CK Day. 4/25/2024andnbsp1:15 PM Medical Record Number: [id number] Patient Account Number: [id number] Date of Birth/Gender: Treating RN: 04/27/1946 (77 y.o. Cline Cools Primary Care Physician: Felix Pacini Other Clinician: Referring Physician: Treating Physician/Extender: Nehemiah Massed in Treatment: 6 Education Assessment Education Provided To: Patient Education Topics Provided Wound/Skin Impairment: Methods: Explain/Verbal Responses: State content correctly Electronic Signature(s) Signed: 12/29/2022 3:29:20 PM By: Redmond Pulling RN, BSN Entered By: Redmond Pulling on 12/29/2022 13:32:11 -------------------------------------------------------------------------------- Wound Assessment Details Patient Name: Date of Service: Mike Day, Mike CK Day. 12/29/2022 1:15 PM Medical Record Number: [id number] Patient Account Number: [id number] Date of Birth/Sex: Treating RN: Dec 13, 1945 (77 y.o. Cline Cools Primary Care Symphonie Schneiderman: Felix Pacini Other Clinician: Referring Cambre Matson: Treating Teaghan Formica/Extender: Nada Boozer Weeks in Treatment: 6 Wound Status Wound Number: 1 Primary Etiology: Venous  Leg Ulcer Wound Location: Right, Lateral Ankle Wound Status: Healed - Epithelialized Wounding Event: Blister Comorbid History: Cataracts, Anemia, Arrhythmia, Hypertension Date Acquired: 07/20/2022 Weeks Of Treatment: 6 Clustered Wound: No Mike Day, Mike Day ([id number]) [id number] Page 5 of 6 Photos Wound Measurements Length: (cm) Width: (cm) Depth: (cm) Area: (cm) Volume: (cm) 0 % Reduction in Area: 100% 0 % Reduction in Volume: 100% 0 Epithelialization: Large (67-100%) 0 Tunneling:  No 0 Undermining: No Wound Description Classification: Full Thickness Without Exposed Support Structures Wound Margin: Distinct, outline attached Exudate Amount: None Present Foul Odor After Cleansing: No Slough/Fibrino No Wound Bed Granulation Amount: None Present (0%) Exposed Structure Necrotic Amount: None Present (0%) Fascia Exposed: No Fat Layer (Subcutaneous Tissue) Exposed: No Tendon Exposed: No Muscle Exposed: No Joint Exposed: No Bone Exposed: No Periwound Skin Texture Texture Color No Abnormalities Noted: Yes No Abnormalities Noted: Yes Moisture Temperature / Pain No Abnormalities Noted: No Temperature: No Abnormality Dry / Scaly: No Tenderness on Palpation: Yes Maceration: No Electronic Signature(s) Signed: 12/29/2022 3:29:20 PM By: Redmond Pulling RN, BSN Entered By: Redmond Pulling on 12/29/2022 13:59:17 -------------------------------------------------------------------------------- Vitals Details Patient Name: Date of Service: Mike Day, Mike CK Day. 12/29/2022 1:15 PM Medical Record Number: [id number] Patient Account Number: [id number] Date of Birth/Sex: Treating RN: 19-Jun-1946 (77 y.o. Cline Cools Primary Care Lynda Capistran: Felix Pacini Other Clinician: Referring Mauri Temkin: Treating Tymesha Ditmore/Extender: Nada Boozer Weeks in Treatment: 6 Vital Signs Time Taken: 13:22 Temperature (Day): 98.9 Height (in): 75 Pulse (bpm): 69 Weight (lbs): 191 Respiratory Rate (breaths/min): 18 Body Mass Index (BMI): 23.9 Blood Pressure (mmHg): 137/81 Reference Range: 80 - 120 mg / dl Electronic Signature(s) Signed: 12/29/2022 3:29:20 PM By: Redmond Pulling RN, BSN Entered By: Redmond Pulling on 12/29/2022 13:27:13 Mike Day ([id number]) [id number] Page 6 of 6

## 2022-12-30 NOTE — Progress Notes (Signed)
CHRISOTPHER, RIVERO ([id number]) [id number] Page 1 of 8 Visit Report for 12/29/2022 Chief Complaint Document Details Patient Name: Date of Service: Mike Day CK F. 12/29/2022 1:15 PM Medical Record Number: [id number] Patient Account Number: [id number] Date of Birth/Sex: Treating RN: 08-Jan-1946 (77 y.o. M) Primary Care Provider: Felix Pacini Other Clinician: Referring Provider: Treating Provider/Extender: Rodman Comp, Renee Weeks in Treatment: 6 Information Obtained from: Patient Chief Complaint Patient presents for treatment of open ulcers due to venous insufficiency Electronic Signature(s) Signed: 12/29/2022 2:16:26 PM By: Duanne Guess MD FACS Entered By: Duanne Guess on 12/29/2022 14:16:26 -------------------------------------------------------------------------------- Debridement Details Patient Name: Date of Service: Mike Day, Mike CK F. 12/29/2022 1:15 PM Medical Record Number: [id number] Patient Account Number: [id number] Date of Birth/Sex: Treating RN: [birth year] (77 y.o. Cline Cools Primary Care Provider: Felix Pacini Other Clinician: Referring Provider: Treating Provider/Extender: Nada Boozer Weeks in Treatment: 6 Debridement Performed for Assessment: Wound #1 Right,Lateral Ankle Performed By: Physician Duanne Guess, MD Debridement Type: Debridement Severity of Tissue Pre Debridement: Fat layer exposed Level of Consciousness (Pre-procedure): Awake and Alert Pre-procedure Verification/Time Out Yes - 13:50 Taken: Start Time: 13:55 Pain Control: Lidocaine 5% topical ointment Percent of Wound Bed Debrided: 100% T Area Debrided (cm): otal 0.03 Tissue and other material debrided: Non-Viable, Eschar Level: Non-Viable Tissue Debridement Description: Selective/Open Wound Instrument: Curette Bleeding: Minimum Hemostasis Achieved: Pressure Procedural Pain: 0 Post Procedural Pain: 0 Response to  Treatment: Procedure was tolerated well Level of Consciousness (Post- Awake and Alert procedure): Post Debridement Measurements of Total Wound Length: (cm) 0.2 Width: (cm) 0.2 Depth: (cm) 0.1 Volume: (cm) 0.003 Character of Wound/Ulcer Post Debridement: Improved Severity of Tissue Post Debridement: Fat layer exposed Post Procedure Diagnosis Same as Pre-procedure Notes Scribed for Dr Lady Gary by Redmond Pulling, RN Electronic Signature(s) Dan Maker ([id number]) [id number] Page 2 of 8 Signed: 12/29/2022 3:29:20 PM By: Redmond Pulling RN, BSN Signed: 12/29/2022 4:24:20 PM By: Duanne Guess MD FACS Entered By: Redmond Pulling on 12/29/2022 13:56:34 -------------------------------------------------------------------------------- HPI Details Patient Name: Date of Service: Mike Day, Mike CK F. 12/29/2022 1:15 PM Medical Record Number: [id number] Patient Account Number: [id number] Date of Birth/Sex: Treating RN: Jan 24, 1946 (77 y.o. M) Primary Care Provider: Felix Pacini Other Clinician: Referring Provider: Treating Provider/Extender: Rodman Comp, Renee Weeks in Treatment: 6 History of Present Illness HPI Description: ADMISSION 11/11/2022 This is a 77 year old man who suffered a fall in November 2023. He had a left hip fracture and left olecranon fracture. He had surgical treatment for both of these and was in rehabilitation for some time. As he became more active, he noted that his legs began to swell. He was seen by his primary care provider on February 16. She initiated Lasix 20 mg daily. He was also noted to have ulcers on both ankles. He was referred to the wound care center. While waiting for his appointment, he was seen by cardiology and had an updated echocardiogram performed. While he has severe left atrial enlargement, his ejection fraction is normal. He is not a diabetic. He has been applying bag balm to his ulcers. He does not wear  compression stockings. On his right lateral ankle, there is a semilinear ulcer with a typical stasis ulcer appearance. There is slough and eschar accumulation. On his left medial ankle, just posterior to the malleolus, there is a small circular ulcer with slough and eschar present. No concern for infection at either site. 11/25/2022: The left medial ankle wound is healed.  The right lateral ankle wound is smaller. There is some slough and eschar accumulation present. He did have his reflux studies performed and he has significant saphenous vein reflux bilaterally. Edema control is improved. 12/01/2022: The right lateral leg wound is about the same size. Again with slough and eschar accumulation. He has an appointment on April 4 with vascular surgery to discuss saphenous vein ablation. 12/08/2022: The right lateral leg wound is smaller and more superficial. There is a little slough and eschar buildup. He met with vascular surgery yesterday and they are planning saphenous vein ablation in a couple of months. 12/15/2022: The right lateral ankle wound continues to contract. It is more superficial. There is some slough and eschar that has reaccumulated. Edema control is good. 12/22/2022: Under a layer of eschar, the right lateral ankle wound is smaller again this week. 12/29/2022: The wound has a thin layer of eschar on the surface. Once this was debrided, the wound was found to be healed. Electronic Signature(s) Signed: 12/29/2022 2:17:37 PM By: Duanne Guess MD FACS Entered By: Duanne Guess on 12/29/2022 14:17:36 -------------------------------------------------------------------------------- Physical Exam Details Patient Name: Date of Service: Mike Day, Mike CK F. 12/29/2022 1:15 PM Medical Record Number: [id number] Patient Account Number: [id number] Date of Birth/Sex: Treating RN: 1946/08/12 (77 y.o. M) Primary Care Provider: Felix Pacini Other Clinician: Referring Provider: Treating  Provider/Extender: Rodman Comp, Renee Weeks in Treatment: 6 Constitutional . . . . no acute distress. Respiratory Normal work of breathing on room air. Notes 12/29/2022: The wound has a thin layer of eschar on the surface. Once this was debrided, the wound was found to be healed. Electronic Signature(s) Signed: 12/29/2022 2:18:31 PM By: Duanne Guess MD FACS Entered By: Duanne Guess on 12/29/2022 14:18:30 Dan Maker ([id number]) [id number] Page 3 of 8 -------------------------------------------------------------------------------- Physician Orders Details Patient Name: Date of Service: Mike Day CK F. 12/29/2022 1:15 PM Medical Record Number: [id number] Patient Account Number: [id number] Date of Birth/Sex: Treating RN: 1945/09/26 (77 y.o. 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 Bathing/ Shower/ Hygiene May shower and wash wound with soap and water. Edema Control - Lymphedema / SCD / Other Elevate legs to the level of the heart or above for 30 minutes daily and/or when sitting for 3-4 times a day throughout the day. Avoid standing for long periods of time. Moisturize legs daily. Other Edema Control Orders/Instructions: - Wear compression socks daily, take off at night Electronic Signature(s) Signed: 12/29/2022 2:18:41 PM By: Duanne Guess MD FACS Entered By: Duanne Guess on 12/29/2022  14:18:41 -------------------------------------------------------------------------------- Problem List Details Patient Name: Date of Service: Mike Day, Mike CK F. 12/29/2022 1:15 PM Medical Record Number: [id number] Patient Account Number: [id number] Date of Birth/Sex: Treating RN: 11/22/1945 (77 y.o. M) Primary Care Provider: Felix Pacini Other Clinician: Referring Provider: Treating Provider/Extender: Rodman Comp, Renee Weeks in Treatment: 6 Active Problems ICD-10 Encounter Code Description Active Date MDM Diagnosis L97.312 Non-pressure chronic ulcer of right ankle with fat layer exposed 11/11/2022 No Yes R60.0 Localized edema 11/11/2022 No Yes I48.21 Permanent atrial fibrillation 11/11/2022 No Yes I83.003 Varicose veins of  unspecified lower extremity with ulcer of ankle 11/11/2022 No Yes Inactive Problems Resolved Problems Mike Day, Mike Day ([id number]) [id number] Page 4 of 8 ICD-10 Code Description Active Date Resolved Date L97.322 Non-pressure chronic ulcer of left ankle with fat layer exposed 11/11/2022 11/11/2022 Electronic Signature(s) Signed: 12/29/2022 2:14:25 PM By: Duanne Guess MD FACS Entered By: Duanne Guess on 12/29/2022 14:14:25 -------------------------------------------------------------------------------- Progress Note Details Patient Name: Date of Service: Mike Day, Mike CK F. 12/29/2022 1:15 PM Medical Record Number: [id number] Patient Account Number: [id number] Date of Birth/Sex: Treating RN: Jan 07, 1946 (77 y.o. M) Primary Care Provider: Felix Pacini Other Clinician: Referring Provider: Treating Provider/Extender: Rodman Comp, Renee Weeks in Treatment: 6 Subjective Chief Complaint Information obtained from Patient Patient presents for treatment of open ulcers due to venous insufficiency History of Present Illness (HPI) ADMISSION 11/11/2022 This is a 77 year old man who suffered a fall in November 2023. He had  a left hip fracture and left olecranon fracture. He had surgical treatment for both of these and was in rehabilitation for some time. As he became more active, he noted that his legs began to swell. He was seen by his primary care provider on February 16. She initiated Lasix 20 mg daily. He was also noted to have ulcers on both ankles. He was referred to the wound care center. While waiting for his appointment, he was seen by cardiology and had an updated echocardiogram performed. While he has severe left atrial enlargement, his ejection fraction is normal. He is not a diabetic. He has been applying bag balm to his ulcers. He does not wear compression stockings. On his right lateral ankle, there is a semilinear ulcer with a typical stasis ulcer appearance. There is slough and eschar accumulation. On his left medial ankle, just posterior to the malleolus, there is a small circular ulcer with slough and eschar present. No concern for infection at either site. 11/25/2022: The left medial ankle wound is healed. The right lateral ankle wound is smaller. There is some slough and eschar accumulation present. He did have his reflux studies performed and he has significant saphenous vein reflux bilaterally. Edema control is improved. 12/01/2022: The right lateral leg wound is about the same size. Again with slough and eschar accumulation. He has an appointment on April 4 with vascular surgery to discuss saphenous vein ablation. 12/08/2022: The right lateral leg wound is smaller and more superficial. There is a little slough and eschar buildup. He met with vascular surgery yesterday and they are planning saphenous vein ablation in a couple of months. 12/15/2022: The right lateral ankle wound continues to contract. It is more superficial. There is some slough and eschar that has reaccumulated. Edema control is good. 12/22/2022: Under a layer of eschar, the right lateral ankle wound is smaller again this  week. 12/29/2022: The wound has a thin layer of eschar on the surface. Once this was debrided, the wound was found to be healed. Patient History Information obtained from Patient. Family History Diabetes - Mother, Heart Disease - Father,Siblings, Hypertension - Siblings, Lung Disease - Father, Stroke - Mother. Social History Former smoker - ended on 09/06/2007, Marital Status - Married, Alcohol Use - Rarely, Drug Use - No History, Caffeine Use - Daily. Medical History Eyes Patient has history of Cataracts - bila eyes Denies history of Glaucoma, Optic Neuritis Ear/Nose/Mouth/Throat Denies history of Chronic sinus problems/congestion, Middle ear problems Hematologic/Lymphatic Patient has history of Anemia Denies history of Hemophilia, Human Immunodeficiency Virus, Lymphedema, Sickle Cell Disease Respiratory Denies history of  Aspiration, Asthma, Chronic Obstructive Pulmonary Disease (COPD), Pneumothorax, Sleep Apnea, Tuberculosis Cardiovascular Patient has history of Arrhythmia - AFIB, Hypertension Gastrointestinal Denies history of Cirrhosis , Colitis, Crohnoos, Hepatitis A, Hepatitis B, Hepatitis C Endocrine Mike Day, Mike Day ([id number]) [id number] Page 5 of 8 Denies history of Type I Diabetes, Type II Diabetes Genitourinary Denies history of End Stage Renal Disease Immunological Denies history of Lupus Erythematosus, Raynaudoos Integumentary (Skin) Denies history of History of Burn Musculoskeletal Denies history of Gout, Rheumatoid Arthritis, Osteoarthritis, Osteomyelitis Oncologic Denies history of Received Chemotherapy, Received Radiation Psychiatric Denies history of Anorexia/bulimia, Confinement Anxiety Objective Constitutional no acute distress. Vitals Time Taken: 1:22 PM, Height: 75 in, Weight: 191 lbs, BMI: 23.9, Temperature: 98.9 F, Pulse: 69 bpm, Respiratory Rate: 18 breaths/min, Blood Pressure: 137/81 mmHg. Respiratory Normal work of  breathing on room air. General Notes: 12/29/2022: The wound has a thin layer of eschar on the surface. Once this was debrided, the wound was found to be healed. Integumentary (Hair, Skin) Wound #1 status is Healed - Epithelialized. Original cause of wound was Blister. The date acquired was: 07/20/2022. The wound has been in treatment 6 weeks. The wound is located on the Right,Lateral Ankle. The wound measures 0cm length x 0cm width x 0cm depth; 0cm^2 area and 0cm^3 volume. There is no tunneling or undermining noted. There is a none present amount of drainage noted. The wound margin is distinct with the outline attached to the wound base. There is no granulation within the wound bed. There is no necrotic tissue within the wound bed. The periwound skin appearance had no abnormalities noted for texture. The periwound skin appearance had no abnormalities noted for color. The periwound skin appearance did not exhibit: Dry/Scaly, Maceration. Periwound temperature was noted as No Abnormality. The periwound has tenderness on palpation. 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 There was a Selective/Open Wound Non-Viable Tissue Debridement with a total area of 0.03 sq cm performed by Duanne Guess, MD. With the following instrument(s): Curette to remove Non-Viable tissue/material. Material removed includes Eschar after achieving pain control using Lidocaine 5% topical ointment. No specimens were taken. A time out was conducted at 13:50, prior to the start of the procedure. A Minimum amount of bleeding was controlled with Pressure. The procedure was tolerated well  with a pain level of 0 throughout and a pain level of 0 following the procedure. Post Debridement Measurements: 0.2cm length x 0.2cm width x 0.1cm depth; 0.003cm^3 volume. Character of Wound/Ulcer Post Debridement is improved. Severity of Tissue Post Debridement is: Fat layer exposed. Post procedure Diagnosis Wound #1: Same as Pre-Procedure General Notes: Scribed for Dr Lady Gary by Redmond Pulling, RN. Plan Discharge From [facility] Services: Discharge from [facility] - CONGRATULATIONS!!!! Bathing/ Shower/ Hygiene: Mike Day, Mike Day ([id number]) [id number] Page 6 of 8 May shower and wash wound with soap and water. Edema Control - Lymphedema / SCD / Other: Elevate legs to the level of the heart or above for 30 minutes daily and/or when sitting for 3-4 times a day throughout the day. Avoid standing for long periods of time. Moisturize legs daily. Other Edema Control Orders/Instructions: - Wear compression socks daily, take off at night 12/29/2022: The wound has a thin layer of eschar  on the surface. Once this was debrided, the wound was found to be healed. I used a curette to debride the eschar from the wound site. Underneath, complete epithelialization was identified. He had his other compression stocking with him and this was applied in clinic. We will discharge him from the wound care center. He was reminded of the importance of wearing his compression stockings daily and elevating his legs throughout the day, as well as at night while he is sleeping. He has follow-up with vascular surgery to discuss saphenous vein ablation in the middle of June. He may follow-up here on an as-needed basis. Electronic Signature(s) Signed: 12/29/2022 2:19:39 PM By: Duanne Guess MD FACS Entered By: Duanne Guess on 12/29/2022 14:19:39 -------------------------------------------------------------------------------- HxROS Details Patient Name: Date of Service: Mike Day, Mike CK F.  12/29/2022 1:15 PM Medical Record Number: [id number] Patient Account Number: [id number] Date of Birth/Sex: Treating RN: 1945/10/01 (77 y.o. M) Primary Care Provider: Felix Pacini Other Clinician: Referring Provider: Treating Provider/Extender: Nada Boozer Weeks in Treatment: 6 Information Obtained From Patient Eyes Medical History: Positive for: Cataracts - bila eyes Negative for: Glaucoma; Optic Neuritis Ear/Nose/Mouth/Throat Medical History: Negative for: Chronic sinus problems/congestion; Middle ear problems Hematologic/Lymphatic Medical History: Positive for: Anemia Negative for: Hemophilia; Human Immunodeficiency Virus; Lymphedema; Sickle Cell Disease Respiratory Medical History: Negative for: Aspiration; Asthma; Chronic Obstructive Pulmonary Disease (COPD); Pneumothorax; Sleep Apnea; Tuberculosis Cardiovascular Medical History: Positive for: Arrhythmia - AFIB; Hypertension Gastrointestinal Medical History: Negative for: Cirrhosis ; Colitis; Crohns; Hepatitis A; Hepatitis B; Hepatitis C Endocrine Medical History: Negative for: Type I Diabetes; Type II Diabetes Genitourinary Medical History: Negative for: End Stage Renal Disease Mike Day, Mike Day ([id number]) [id number] Page 7 of 8 Immunological Medical History: Negative for: Lupus Erythematosus; Raynauds Integumentary (Skin) Medical History: Negative for: History of Burn Musculoskeletal Medical History: Negative for: Gout; Rheumatoid Arthritis; Osteoarthritis; Osteomyelitis Oncologic Medical History: Negative for: Received Chemotherapy; Received Radiation Psychiatric Medical History: Negative for: Anorexia/bulimia; Confinement Anxiety HBO Extended History Items Eyes: Cataracts Immunizations Pneumococcal Vaccine: Received Pneumococcal Vaccination: Yes Received Pneumococcal Vaccination On or After 60th Birthday: Yes Implantable Devices No devices added Family  and Social History Diabetes: Yes - Mother; Heart Disease: Yes - Father,Siblings; Hypertension: Yes - Siblings; Lung Disease: Yes - Father; Stroke: Yes - Mother; Former smoker - ended on 09/06/2007; Marital Status - Married; Alcohol Use: Rarely; Drug Use: No History; Caffeine Use: Daily; Financial Concerns: No; Food, Clothing or Shelter Needs: No; Support System Lacking: No; Transportation Concerns: No Psychologist, prison and probation services) Signed: 12/29/2022 4:24:20 PM By: Duanne Guess MD FACS Entered By: Duanne Guess on 12/29/2022 14:17:44 -------------------------------------------------------------------------------- SuperBill Details Patient Name: Date of Service: Mike Day, Mike CK F. 12/29/2022 Medical Record Number: [id number] Patient Account Number: [id number] Date of Birth/Sex: Treating RN: [birth year] (77 y.o. 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 Facility Procedures : Mike Day, Mike Day Code: [id number] Mike Day ([id number]) ICD- Description: [phone] - DEBRIDE WOUND 1ST 20 SQ CM OR < [phone] 10 Diagnosis Description L97.312 Non-pressure chronic ulcer of right ankle with fat layer exposed Modifier: [id number] Quantity: 1 7.pdf Page 8 of 8 Physician Procedures : CPT4 Code Description Modifier [id number] - WC PHYS LEVEL 3 - EST PT 25 ICD-10 Diagnosis Description L97.312  Non-pressure chronic ulcer of right ankle with fat layer exposed R60.0 Localized edema I83.003 Varicose veins of unspecified lower extremity  with ulcer of ankle I48.21 Permanent atrial fibrillation Quantity: 1 : [id number] - WC PHYS DEBR WO  ANESTH 20 SQ CM ICD-10 Diagnosis Description L97.312 Non-pressure chronic ulcer of right ankle with fat layer exposed Quantity: 1 Electronic Signature(s) Signed: 12/29/2022 2:20:13 PM By: Duanne Guess MD FACS Entered By: Duanne Guess on 12/29/2022 14:20:13

## 2023-01-05 ENCOUNTER — Ambulatory Visit (HOSPITAL_BASED_OUTPATIENT_CLINIC_OR_DEPARTMENT_OTHER): Payer: Medicare Other | Admitting: General Surgery

## 2023-01-12 ENCOUNTER — Ambulatory Visit (HOSPITAL_BASED_OUTPATIENT_CLINIC_OR_DEPARTMENT_OTHER): Payer: Medicare Other | Admitting: General Surgery

## 2023-01-24 ENCOUNTER — Telehealth (HOSPITAL_COMMUNITY): Payer: Self-pay | Admitting: *Deleted

## 2023-01-24 NOTE — Telephone Encounter (Signed)
Attempted to call patient regarding upcoming cardiac MRI appointment. Left message on voicemail with name and callback number  Daryle Boyington RN Navigator Cardiac Imaging [facility] [phone] Office [phone] Cell  

## 2023-01-24 NOTE — Telephone Encounter (Signed)
Reaching out to patient to offer assistance regarding upcoming cardiac imaging study; pt verbalizes understanding of appt date/time, parking situation and where to check in, and verified current allergies; name and call back number provided for further questions should they arise  Larey Brick RN Navigator Cardiac Imaging [facility] [phone] office [phone] cell  Patient denies claustrophobia but report a pin in his hip, a plate in is foot and a titanium rod in his femur.

## 2023-01-25 ENCOUNTER — Ambulatory Visit (HOSPITAL_COMMUNITY)
Admission: RE | Admit: 2023-01-25 | Discharge: 2023-01-25 | Disposition: A | Discharge status: Home / Self Care | Payer: Medicare Other | Source: Ambulatory Visit | Attending: Physician Assistant | Admitting: Physician Assistant

## 2023-01-25 ENCOUNTER — Other Ambulatory Visit: Payer: Self-pay | Admitting: Physician Assistant

## 2023-01-25 DIAGNOSIS — I517 Cardiomegaly: Secondary | ICD-10-CM

## 2023-01-25 MED ORDER — GADOBUTROL 1 MMOL/ML IV SOLN
8.0000 mL | Freq: Once | INTRAVENOUS | Status: AC | PRN
  Administered 2023-01-25: 8 mL via INTRAVENOUS

## 2023-01-31 ENCOUNTER — Encounter: Payer: Self-pay | Admitting: Physician Assistant

## 2023-02-08 ENCOUNTER — Ambulatory Visit: Payer: Medicare Other | Admitting: Vascular Surgery

## 2023-02-09 ENCOUNTER — Encounter: Payer: Self-pay | Admitting: Family Medicine

## 2023-02-22 ENCOUNTER — Encounter: Payer: Self-pay | Admitting: Vascular Surgery

## 2023-02-22 ENCOUNTER — Ambulatory Visit (INDEPENDENT_AMBULATORY_CARE_PROVIDER_SITE_OTHER): Payer: Medicare Other | Admitting: Vascular Surgery

## 2023-02-22 VITALS — BP 111/67 | HR 72 | Temp 98.2°F | Resp 18 | Ht 72.0 in | Wt 195.4 lb

## 2023-02-22 DIAGNOSIS — I872 Venous insufficiency (chronic) (peripheral): Secondary | ICD-10-CM

## 2023-02-22 NOTE — Progress Notes (Signed)
REASON FOR VISIT:   Follow-up of chronic venous insufficiency  MEDICAL ISSUES:   CHRONIC VENOUS INSUFFICIENCY: This patient's wound on the right leg has healed.  He is currently not having any symptoms from venous hypertension.  Although he has significant superficial venous reflux especially on the left, I would not recommend laser ablation given his lack of symptoms.  We have discussed the importance of trying to avoid prolonged sitting and standing.  We discussed the importance of exercise.  He has been wearing his compression stockings.  He is also been elevating his legs.  I will be happy to see him back at any time if he develops new symptoms or new wound.  He has evidence of some mild peripheral arterial disease but again given that the wound healed I do not think an aggressive workup is needed for this unless he develops new symptoms.  HPI:   Mike Day is a pleasant 77 y.o. male who saw Mike Day, [name] on 12/08/2022 with leg swelling.  He had been going to the [facility] wound care center for about 4 weeks by that time for a wound on the right lateral malleolus.  He is on Eliquis for A-fib.  Since he was seen last, the wound on the right leg has healed.  Currently he denies any symptoms in his legs.  He denies claudication or rest pain.  He denies any significant aching pain heaviness or tiredness in his legs.  He has no problems with leg swelling.  He does wear compression stockings fairly faithfully.  Past Medical History:  Diagnosis Date   Anemia    Early 20's   Atrial fibrillation (HCC) 10/07/2014   3-14 day cardiac monitor 03/2019:  AFib, Avg HR 69, no significant pauses, PVCs, wide complex runs (aberrancy vs NSVT - longest 32 beats).   Atrial flutter (HCC) 2008   Status post RFCA Dr Lewayne Bunting 2008   Carotid artery plaque    Carotid US 8/21: Bilat ICA 1-39   Chronic atrial fibrillation (HCC)    Eliquis   Closed nondisplaced fracture of left patella 01/20/2020    Dermatochalasis of both upper eyelids 07/01/2014   Dysrhythmia    a fib    Hx of adenomatous colonic polyps    Hyperlipidemia    Hypertension    LVH (left ventricular hypertrophy) 11/02/2022   LVH  TTE 08/2013: severe basal septal LVH  TTE 03/2019: severe asymmetric LVH, EF 60-65, RVSP 24.6  TTE 10/28/22: EF 60, no RWMA, severe asymmetric LVH, NL RVSF, severe RVE, mild LAE, severe RAE, mild MR, AV sclerosis, asc aorta 41 mm, RAP 8  CMR 01/25/23: EF 57, mod ASH, RVEF 44, no significant LGE   Malignant neoplasm of prostate (HCC) 05/18/2012   Neuromuscular disorder (HCC)    neuropathy feet   Patellar sleeve fracture of left knee 2021   Prostate cancer (HCC) 2006   Prostatectomy   Shingles 01/2019   left side of face/eye/neck/ear    SKIN CANCER, HX OF 03/11/2009   Moh's L hand for Squamous Cell;2 other squamous cells removed w/o Mohs 2013 Mike Poisson MD  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    Family History  Problem Relation Age of Onset   Lung disease Father        Black Lung   Heart disease Father        Congenital   Emphysema Father    COPD Father    Diabetes Mother  Stroke Mother 8   Hypertension Brother    Heart disease Brother    Hyperlipidemia Brother    Heart attack Brother 42       sudden death   Cancer Neg Hx    Colon cancer Neg Hx    Esophageal cancer Neg Hx    Rectal cancer Neg Hx    Stomach cancer Neg Hx     SOCIAL HISTORY: Social History   Tobacco Use   Smoking status: Former    Types: Cigarettes    Quit date: 09/06/2007    Years since quitting: 15.4   Smokeless tobacco: Never   Tobacco comments:    smoked 1963 -2009 . Cigars < 1 / day on average 1992-2009  Substance Use Topics   Alcohol use: Yes    Alcohol/week: 7.0 standard drinks of alcohol    Types: 7 Glasses of wine per week    Comment: Socially    Allergies  Allergen Reactions   Levofloxacin Rash   Neosporin [Neomycin-Bacitracin Zn-Polymyx] Other (See Comments)    Dry/scaly  skin/blistering    Tape Other (See Comments)    ADHESIVE TAPE; BLISTERS,    Current Outpatient Medications  Medication Sig Dispense Refill   acetaminophen (TYLENOL) 500 MG tablet Take 1,000 mg by mouth every 8 (eight) hours as needed for moderate pain.     alendronate (FOSAMAX) 70 MG tablet Take 1 tablet (70 mg total) by mouth every 7 (seven) days. Take with a full glass of water on an empty stomach. 4 tablet 1   apixaban (ELIQUIS) 5 MG TABS tablet Take 1 tablet (5 mg total) by mouth 2 (two) times daily. 180 tablet 1   cholecalciferol (VITAMIN D3) 25 MCG (1000 UNIT) tablet Take 2 tablets (2,000 Units total) by mouth daily. 180 tablet 3   Emollient (CETAPHIL) cream Apply 1 application topically daily.     furosemide (LASIX) 20 MG tablet Take 1 tablet (20 mg total) by mouth daily. 90 tablet 3   lisinopril (ZESTRIL) 20 MG tablet TAKE 1 TABLET DAILY 90 tablet 3   simvastatin (ZOCOR) 20 MG tablet TAKE 1 TABLET AT BEDTIME 90 tablet 3   triamcinolone (NASACORT) 55 MCG/ACT AERO nasal inhaler Place 1 spray into the nose daily as needed (allergies).     No current facility-administered medications for this visit.    REVIEW OF SYSTEMS:  [X]  denotes positive finding, [ ]  denotes negative finding Cardiac  Comments:  Chest pain or chest pressure:    Shortness of breath upon exertion:    Short of breath when lying flat:    Irregular heart rhythm:        Vascular    Pain in calf, thigh, or hip brought on by ambulation:    Pain in feet at night that wakes you up from your sleep:     Blood clot in your veins:    Leg swelling:         Pulmonary    Oxygen at home:    Productive cough:     Wheezing:         Neurologic    Sudden weakness in arms or legs:     Sudden numbness in arms or legs:     Sudden onset of difficulty speaking or slurred speech:    Temporary loss of vision in one eye:     Problems with dizziness:         Gastrointestinal    Blood in stool:     Vomited blood:  Genitourinary    Burning when urinating:     Blood in urine:        Psychiatric    Major depression:         Hematologic    Bleeding problems:    Problems with blood clotting too easily:        Skin    Rashes or ulcers:        Constitutional    Fever or chills:     PHYSICAL EXAM:   Vitals:   02/22/23 1255  BP: 111/67  Pulse: 72  Resp: 18  Temp: 98.2 F (36.8 C)  TempSrc: Temporal  SpO2: 95%  Weight: 195 lb 6.4 oz (88.6 kg)  Height: 6' (1.829 m)    GENERAL: The patient is a well-nourished male, in no acute distress. The vital signs are documented above. CARDIAC: There is a regular rate and rhythm.  VASCULAR: I do not detect carotid bruits. I cannot palpate pedal pulses.  He has monophasic Doppler signals in both feet. The wound on the right leg is healed. 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   PULMONARY: There is good air exchange bilaterally without wheezing or rales. ABDOMEN: Soft and non-tender with normal pitched bowel sounds.  MUSCULOSKELETAL: There are no major deformities or cyanosis. NEUROLOGIC: No focal weakness or paresthesias are detected. SKIN: There are no ulcers or rashes noted. PSYCHIATRIC: The patient has a normal affect.  DATA:    VENOUS DUPLEX: I have independently interpreted his venous duplex scan today.  On the right side there is no evidence of DVT.  There is deep venous reflux in the common femoral vein and popliteal vein.  There is superficial venous reflux in the right great saphenous vein from the saphenofemoral junction to the distal thigh.  Diameters of the vein ranged from 3.4-4.4 mm.  There is some reflux in the small saphenous vein where there is chronic thrombus.  Results of the study are summarized in the diagram below.    On the left side, there is no evidence of DVT.  There is deep venous reflux in the common femoral vein and femoral vein.  There is superficial venous reflux in the left great saphenous vein from the  saphenofemoral junction to the mid calf.  Diameters of the vein ranged from 4.3-4.9 mm.  The results of the study are summarized in the diagram below.    [facility] [phone]

## 2023-03-12 ENCOUNTER — Encounter: Payer: Self-pay | Admitting: Family Medicine

## 2023-03-14 ENCOUNTER — Other Ambulatory Visit: Payer: Self-pay | Admitting: Family Medicine

## 2023-03-14 ENCOUNTER — Ambulatory Visit (INDEPENDENT_AMBULATORY_CARE_PROVIDER_SITE_OTHER): Payer: Medicare Other | Admitting: Family Medicine

## 2023-03-14 VITALS — BP 139/72 | HR 59 | Temp 97.8°F | Wt 196.2 lb

## 2023-03-14 DIAGNOSIS — S32010G Wedge compression fracture of first lumbar vertebra, subsequent encounter for fracture with delayed healing: Secondary | ICD-10-CM

## 2023-03-14 DIAGNOSIS — R413 Other amnesia: Secondary | ICD-10-CM | POA: Diagnosis not present

## 2023-03-14 DIAGNOSIS — I679 Cerebrovascular disease, unspecified: Secondary | ICD-10-CM | POA: Diagnosis not present

## 2023-03-14 DIAGNOSIS — G319 Degenerative disease of nervous system, unspecified: Secondary | ICD-10-CM

## 2023-03-14 DIAGNOSIS — M546 Pain in thoracic spine: Secondary | ICD-10-CM

## 2023-03-14 DIAGNOSIS — F320 Major depressive disorder, single episode, mild: Secondary | ICD-10-CM | POA: Diagnosis not present

## 2023-03-14 DIAGNOSIS — Z8546 Personal history of malignant neoplasm of prostate: Secondary | ICD-10-CM

## 2023-03-14 DIAGNOSIS — E559 Vitamin D deficiency, unspecified: Secondary | ICD-10-CM

## 2023-03-14 DIAGNOSIS — R4789 Other speech disturbances: Secondary | ICD-10-CM | POA: Diagnosis not present

## 2023-03-14 DIAGNOSIS — E538 Deficiency of other specified B group vitamins: Secondary | ICD-10-CM

## 2023-03-14 LAB — CBC WITH DIFFERENTIAL/PLATELET
Basophils Absolute: 0 10*3/uL (ref 0.0–0.1)
Basophils Relative: 0.8 % (ref 0.0–3.0)
Eosinophils Absolute: 0.1 10*3/uL (ref 0.0–0.7)
Eosinophils Relative: 1.4 % (ref 0.0–5.0)
HCT: 38.5 % — ABNORMAL LOW (ref 39.0–52.0)
Hemoglobin: 13 g/dL (ref 13.0–17.0)
Lymphocytes Relative: 12.8 % (ref 12.0–46.0)
Lymphs Abs: 0.8 10*3/uL (ref 0.7–4.0)
MCHC: 33.8 g/dL (ref 30.0–36.0)
MCV: 100 fl (ref 78.0–100.0)
Monocytes Absolute: 0.7 10*3/uL (ref 0.1–1.0)
Monocytes Relative: 11.9 % (ref 3.0–12.0)
Neutro Abs: 4.3 10*3/uL (ref 1.4–7.7)
Neutrophils Relative %: 73.1 % (ref 43.0–77.0)
Platelets: 190 10*3/uL (ref 150.0–400.0)
RBC: 3.85 Mil/uL — ABNORMAL LOW (ref 4.22–5.81)
RDW: 13.6 % (ref 11.5–15.5)
WBC: 5.9 10*3/uL (ref 4.0–10.5)

## 2023-03-14 LAB — COMPREHENSIVE METABOLIC PANEL
ALT: 10 U/L (ref 0–53)
AST: 10 U/L (ref 0–37)
Albumin: 4.1 g/dL (ref 3.5–5.2)
Alkaline Phosphatase: 61 U/L (ref 39–117)
BUN: 13 mg/dL (ref 6–23)
CO2: 28 mEq/L (ref 19–32)
Calcium: 9.5 mg/dL (ref 8.4–10.5)
Chloride: 99 mEq/L (ref 96–112)
Creatinine, Ser: 0.85 mg/dL (ref 0.40–1.50)
GFR: 84.16 mL/min (ref >60.00)
Glucose, Bld: 143 mg/dL — ABNORMAL HIGH (ref 70–99)
Potassium: 4.3 mEq/L (ref 3.5–5.1)
Sodium: 136 mEq/L (ref 135–145)
Total Bilirubin: 0.7 mg/dL (ref 0.2–1.2)
Total Protein: 6.5 g/dL (ref 6.0–8.3)

## 2023-03-14 LAB — B12 AND FOLATE PANEL
Folate: 11 ng/mL (ref >5.9)
Vitamin B-12: >1500 pg/mL — ABNORMAL HIGH (ref 211–911)

## 2023-03-14 LAB — VITAMIN D 25 HYDROXY (VIT D DEFICIENCY, FRACTURES): VITD: 33.82 ng/mL (ref 30.00–100.00)

## 2023-03-14 LAB — TSH: TSH: 1.07 u[IU]/mL (ref 0.35–5.50)

## 2023-03-14 MED ORDER — PAROXETINE HCL 10 MG PO TABS: 20.0000 mg | ORAL_TABLET | Freq: Every day | ORAL | 1 refills | Status: DC

## 2023-03-14 MED ORDER — ALENDRONATE SODIUM 70 MG PO TABS: 70.0000 mg | ORAL_TABLET | ORAL | 1 refills | Status: DC

## 2023-03-14 NOTE — Progress Notes (Unsigned)
JORI Day , [birth year], 77 y.o., male MRN: [id number] Patient Care Team    Relationship Specialty Notifications Start End  Natalia Leatherwood, DO PCP - General Family Medicine  06/11/18   Tonny Bollman, MD PCP - Cardiology Cardiology  08/21/20   Samson Frederic, MD Consulting Physician Orthopedic Surgery  10/03/15   Kennon Rounds Physician Assistant Cardiology  04/29/16   Venancio Poisson, MD Consulting Physician Dermatology  08/24/17   Esaw Dace, MD Attending Physician Urology  06/11/18   Sallye Lat, MD Consulting Physician Ophthalmology  06/11/18   Myra Rude, MD (Inactive) Consulting Physician Sports Medicine  01/21/20   Sheral Apley, MD Consulting Physician Orthopedic Surgery  07/11/22     Chief Complaint  Patient presents with   Memory Loss    Noticed the last few months forgetfulness has gotten worse as well as frustration     Subjective: Mike Day is a 77 y.o. Pt presents for an OV with multiple complaints   Memory deficits/cerebral atrophy/cerebral artery disease Patient reports he is having difficulty with his memory, mostly short-term memory.  He states he can remember things its happened many years ago but is having difficulty remembering what he did yesterday.  He reports this is extremely frustrating for him.  He also feels like people reminding him of his memory decline is frustrating and upsetting to him.  He reports he does not get lost when driving.  He does have difficulty recalling recent conversations in detail.  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  He denies any particular event that occurred where he would have suspected he had a TIA or stroke.  He is concerned for the deficit now that it has progressed.  He has a history of vitamin D and B12 deficiencies and is supplementing.  He is sleeping okay, but has issues with his backpain.  Elevated depression questionnaire: Score of 23 on PHQ-9.   Patient endorses it being very difficult to carry out his day. Patient reports he does not feel like he functions as well as he used to.  He has pain in his back that has been persistent since his fall last year.  Feels his memory is declining and that is frustrating to him.  He reports he has nothing in his life that he has left to do or desires to do, such as a bucket list to look forward to.  He just does not know what to do, or how to figure out what to do to regain enjoyment.  Patient denies HI.  He has suicidal ideations, but denies any plan to do so.  Back pain: Has a history of lumbar compression fraction and thoracic back pain.  He reports he is having a lot of issues getting comfortable and he is unable to remain as active as he would like or exercise secondary to back discomfort.  He has been orthopedics and sports med for this condition.  He is planning on seeing an acupuncturist shortly to see if they can add to his treatment. He is unable to tolerate muscle relaxers secondary to sedation. He has been on tramadol in the past, but currently prefers not to be on prescribed medicine for his back pain.     03/14/2023   11:41 AM 10/21/2022   12:53 PM 01/29/2022    9:33 AM 06/22/2021   11:03 AM 08/18/2020    9:26 AM  Depression screen  PHQ 2/9  Decreased Interest 3 0 0 0 0  Down, Depressed, Hopeless 3 0 0 0 0  PHQ - 2 Score 6 0 0 0 0  Altered sleeping 3      Tired, decreased energy 2      Change in appetite 1      Feeling bad or failure about yourself  3      Trouble concentrating 2      Moving slowly or fidgety/restless 3      Suicidal thoughts 3      PHQ-9 Score 23      Difficult doing work/chores Very difficult        Allergies  Allergen Reactions   Levofloxacin Rash   Neosporin [Neomycin-Bacitracin Zn-Polymyx] Other (See Comments)    Dry/scaly skin/blistering    Tape Other (See Comments)    ADHESIVE TAPE; BLISTERS,   Social History   Social History Narrative   Married, 1  son, 2 grand-daughters.   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   Alcohol: 1 glass red wine per night.   Was once a long distance runner.   Past Medical History:  Diagnosis Date   Anemia    Early 20's   Atrial fibrillation (HCC) 10/07/2014   3-14 day cardiac monitor 03/2019:  AFib, Avg HR 69, no significant pauses, PVCs, wide complex runs (aberrancy vs NSVT - longest 32 beats).   Atrial flutter (HCC) 2008   Status post RFCA Dr Lewayne Bunting 2008   Carotid artery plaque    Carotid US 8/21: Bilat ICA 1-39   Chronic atrial fibrillation (HCC)    Eliquis   Closed fracture of left olecranon process 07/13/2022   Closed nondisplaced fracture of left patella 01/20/2020   Closed nondisplaced intertrochanteric fracture of left femur (HCC) 07/13/2022   Dermatochalasis of both upper eyelids 07/01/2014   Dysrhythmia    a fib    Hip fracture (HCC) 07/10/2022   Hx of adenomatous colonic polyps    Hyperlipidemia    Hypertension    LVH (left ventricular hypertrophy) 11/02/2022   LVH  TTE 08/2013: severe basal septal LVH  TTE 03/2019: severe asymmetric LVH, EF 60-65, RVSP 24.6  TTE 10/28/22: EF 60, no RWMA, severe asymmetric LVH, NL RVSF, severe RVE, mild LAE, severe RAE, mild MR, AV sclerosis, asc aorta 41 mm, RAP 8  CMR 01/25/23: EF 57, mod ASH, RVEF 44, no significant LGE   Malignant neoplasm of prostate (HCC) 05/18/2012   Neuromuscular disorder (HCC)    neuropathy feet   Night sweats 12/10/2021   Patellar sleeve fracture of left knee 2021   Prostate cancer (HCC) 2006   Prostatectomy   Shingles 01/2019   left side of face/eye/neck/ear    SKIN CANCER, HX OF 03/11/2009   Moh's L hand for Squamous Cell;2 other squamous cells removed w/o Mohs 2013 [name]  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   Ulcer of lower extremity (HCC) 10/21/2022   Past Surgical History:  Procedure Laterality Date   BLADDER SUSPENSION  2010   COLONOSCOPY W/  POLYPECTOMY  2003;2010;04/2014   Tubular adenoma: recall 5 yrs (after 04/2019)   EYE SURGERY Bilateral 2021,2022   FEMUR IM NAIL Left 07/12/2022   Procedure: INTRAMEDULLARY (IM) NAIL FEMORAL;  Surgeon: Sheral Apley, MD;  Location: [facility];  Service: Orthopedics;  Laterality: Left;   HERNIA REPAIR  07/2008   Dr.Martin   INGUINAL HERNIA REPAIR  Left 08/25/2020   Procedure: OPEN LEFT INGUINAL HERNIA REPAIR WITH MESH;  Surgeon: Luretha Murphy, MD;  Location: [facility];  Service: General;  Laterality: Left;   MOHS SURGERY     left hand   MOHS SURGERY Left 12/2020   ear   ORIF ELBOW FRACTURE Left 07/14/2022   Procedure: OPEN REDUCTION INTERNAL FIXATION (ORIF) OF LEFT ELBOW;  Surgeon: Sheral Apley, MD;  Location: [facility];  Service: Orthopedics;  Laterality: Left;   PROSTATECTOMY  2006   Robotic for adenocarcinoma; Dr Darvin Neighbours   RADIOFREQUENCY ABLATION  01/15/2007   for ectopic atrial foci   SEPTOPLASTY  Age 28   TRANSTHORACIC ECHOCARDIOGRAM  09/02/2013   Normal.  EF 60%.  LAE and RAE.   XI ROBOTIC ASSISTED INGUINAL HERNIA REPAIR WITH MESH Right 01/11/2021   Procedure: XI ROBOTIC ASSISTED  RIGHT INGUINAL HERNIA REPAIR WITH MESH;  Surgeon: Luretha Murphy, MD;  Location: [facility];  Service: General;  Laterality: Right;   Family History  Problem Relation Age of Onset   Lung disease Father        Black Lung   Heart disease Father        Congenital   Emphysema Father    COPD Father    Diabetes Mother    Stroke Mother 62   Hypertension Brother    Heart disease Brother    Hyperlipidemia Brother    Heart attack Brother 7       sudden death   Cancer Neg Hx    Colon cancer Neg Hx    Esophageal cancer Neg Hx    Rectal cancer Neg Hx    Stomach cancer Neg Hx    Allergies as of 03/14/2023       Reactions   Levofloxacin Rash   Neosporin [neomycin-bacitracin Zn-polymyx] Other (See Comments)   Dry/scaly skin/blistering    Tape Other (See Comments)   ADHESIVE TAPE; BLISTERS,         Medication List        Accurate as of March 14, 2023 11:59 PM. If you have any questions, ask your nurse or doctor.          acetaminophen 500 MG tablet Commonly known as: TYLENOL Take 1,000 mg by mouth every 8 (eight) hours as needed for moderate pain.   alendronate 70 MG tablet Commonly known as: FOSAMAX Take 1 tablet (70 mg total) by mouth every 7 (seven) days. Take with a full glass of water on an empty stomach.   apixaban 5 MG Tabs tablet Commonly known as: Eliquis Take 1 tablet (5 mg total) by mouth 2 (two) times daily.   cetaphil cream Apply 1 application topically daily.   cholecalciferol 25 MCG (1000 UNIT) tablet Commonly known as: VITAMIN D3 Take 2 tablets (2,000 Units total) by mouth daily.   furosemide 20 MG tablet Commonly known as: LASIX Take 1 tablet (20 mg total) by mouth daily.   lisinopril 20 MG tablet Commonly known as: ZESTRIL TAKE 1 TABLET DAILY   PARoxetine 10 MG tablet Commonly known as: PAXIL Take 1 tablet (10 mg total) by mouth daily. Started by: Felix Pacini   simvastatin 20 MG tablet Commonly known as: ZOCOR TAKE 1 TABLET AT BEDTIME   triamcinolone 55 MCG/ACT Aero nasal inhaler Commonly known as: NASACORT Place 1 spray into the nose daily as needed (allergies).        All past medical history, surgical history, allergies, family history, immunizations andmedications were updated in the EMR today and  reviewed under the history and medication portions of their EMR.     ROS Negative, with the exception of above mentioned in HPI   Objective:  BP 139/72   Pulse (!) 59   Temp 97.8 F (36.6 C)   Wt 196 lb 3.2 oz (89 kg)   SpO2 99%   BMI 26.61 kg/m  Body mass index is 26.61 kg/m. Physical Exam Vitals and nursing note reviewed.  Constitutional:      General: He is not in acute distress.    Appearance: Normal appearance. He is not ill-appearing, toxic-appearing or diaphoretic.  HENT:     Head: Normocephalic and atraumatic.   Eyes:     General: No scleral icterus.       Right eye: No discharge.        Left eye: No discharge.     Extraocular Movements: Extraocular movements intact.     Pupils: Pupils are equal, round, and reactive to light.  Cardiovascular:     Rate and Rhythm: Normal rate and regular rhythm.  Pulmonary:     Effort: Pulmonary effort is normal. No respiratory distress.     Breath sounds: Normal breath sounds. No wheezing, rhonchi or rales.  Abdominal:     General: Bowel sounds are normal.     Palpations: Abdomen is soft.     Tenderness: There is right CVA tenderness and left CVA tenderness.  Musculoskeletal:        General: Tenderness present.     Right lower leg: No edema.     Left lower leg: No edema.     Comments: Patient winces with sharp shooting pain in his back with movement.  Skin:    General: Skin is warm.     Findings: No rash.  Neurological:     Mental Status: He is alert and oriented to person, place, and time. Mental status is at baseline.  Psychiatric:        Mood and Affect: Mood is depressed.        Speech: Speech normal.        Behavior: Behavior normal. Behavior is cooperative.        Thought Content: Thought content normal. Thought content is not paranoid or delusional. Thought content does not include homicidal or suicidal ideation. Thought content does not include homicidal or suicidal plan.        Cognition and Memory: He exhibits impaired recent memory.        Judgment: Judgment normal.      No results found. No results found. No results found for this or any previous visit (from the past 24 hour(s)).  Assessment/Plan: OLIVIER FRAYRE is a 77 y.o. male present for OV for  Vitamin D deficiency - He reports he is supplementing with vitamin D - Vitamin D (25 hydroxy) B12 deficiency He reports he is supplementing B12 - B12 and Folate Panel Memory deficits/Cerebral atrophy (HCC)/Cerebral arterial disease Will rule out vitamin deficiencies, electrolyte  deficiencies, urinary tract infection and thyroid disease as possible cause of decrease or changes in memory.  He does have a history of cerebral atrophy and cerebral arterial disease which could be progressing and causing his cognitive decline.  Cannot rule out other neurological events/stroke or malignancy as cause of decline.  He may be a small amount of change in his speech noted today.  Will order MRI of brain to be thorough. - Vitamin D (25 hydroxy) - B12 and Folate Panel - TSH - Comp Met (CMET) - CBC  w/Diff - Urinalysis w microscopic + reflex cultur - MRI w and wo ordered today  Back pain/ compression fraction of lumbar spine: He is going to go to an acupuncturist to help with his pain. He is unable to tolerate any muscle relaxant secondary to sedation.  He has been evaluated by sports medicine and orthopedics. Consider baclofen for muscle spasms which is appreciated on exam today.  Could consider opiate or Lyrica.  This is not his current plan.  Depression, major, single episode, mild (HCC) The discussion today with patient surrounding his mental health. We discussed medications and he would like to start Paxil 10 mg daily. Increase physical activity is much as tolerated.  This is difficult for him with his back pain. Declines referral to therapist today    Reviewed expectations re: course of current medical issues. Discussed self-management of symptoms. Outlined signs and symptoms indicating need for more acute intervention. Patient verbalized understanding and all questions were answered. Patient received an After-Visit Summary.    Orders Placed This Encounter  Procedures   MR BRAIN W WO CONTRAST   Vitamin D (25 hydroxy)   B12 and Folate Panel   TSH   Comp Met (CMET)   CBC w/Diff   Urinalysis w microscopic + reflex cultur   REFLEXIVE URINE CULTURE   Meds ordered this encounter  Medications   DISCONTD: PARoxetine (PAXIL) 10 MG tablet    Sig: Take 2 tablets (20 mg  total) by mouth daily.    Dispense:  90 tablet    Refill:  1   alendronate (FOSAMAX) 70 MG tablet    Sig: Take 1 tablet (70 mg total) by mouth every 7 (seven) days. Take with a full glass of water on an empty stomach.    Dispense:  4 tablet    Refill:  1   Referral Orders  No referral(s) requested today   43 minutes spent within patient encounter covering multiple concerns requiring multiple labs and imaging studies to be ordered for him.  Note is dictated utilizing voice recognition software. Although note has been proof read prior to signing, occasional typographical errors still can be missed. If any questions arise, please do not hesitate to call for verification.   electronically signed by:  Felix Pacini, DO  [facility] - OR

## 2023-03-14 NOTE — Patient Instructions (Addendum)
[facility] Mauro Kaufmann, [place] Address: [address] [place], [place] [address] Phone: [phone]   Start paxil 10 mg a day.  Follow up in 4 weeks  We will call you with results.  I ordered an image of brain, they will call you to schedule.

## 2023-03-15 ENCOUNTER — Encounter: Payer: Self-pay | Admitting: Family Medicine

## 2023-03-15 ENCOUNTER — Telehealth: Payer: Self-pay | Admitting: Family Medicine

## 2023-03-15 DIAGNOSIS — R413 Other amnesia: Secondary | ICD-10-CM | POA: Insufficient documentation

## 2023-03-15 DIAGNOSIS — F320 Major depressive disorder, single episode, mild: Secondary | ICD-10-CM | POA: Insufficient documentation

## 2023-03-15 DIAGNOSIS — R109 Unspecified abdominal pain: Secondary | ICD-10-CM

## 2023-03-15 DIAGNOSIS — R82998 Other abnormal findings in urine: Secondary | ICD-10-CM

## 2023-03-15 HISTORY — DX: Major depressive disorder, single episode, mild: F32.0

## 2023-03-15 LAB — URINALYSIS W MICROSCOPIC + REFLEX CULTURE
Bacteria, UA: NONE SEEN /HPF
Bilirubin Urine: NEGATIVE
Glucose, UA: NEGATIVE
Hgb urine dipstick: NEGATIVE
Hyaline Cast: NONE SEEN /LPF
Ketones, ur: NEGATIVE
Leukocyte Esterase: NEGATIVE
Nitrites, Initial: NEGATIVE
Protein, ur: NEGATIVE
Specific Gravity, Urine: 1.027 (ref 1.001–1.035)
Squamous Epithelial / HPF: NONE SEEN /HPF (ref <=5)
WBC, UA: NONE SEEN /HPF (ref 0–5)
pH: <=5 (ref 5.0–8.0)

## 2023-03-15 LAB — NO CULTURE INDICATED

## 2023-03-15 NOTE — Telephone Encounter (Signed)
Returned call and given pt results/recommendations.

## 2023-03-15 NOTE — Telephone Encounter (Signed)
Patient returning call about lab results.  Please call patient back when available. He has his phone with him now.

## 2023-03-15 NOTE — Telephone Encounter (Signed)
LM for pt to return call to discuss.  

## 2023-03-15 NOTE — Telephone Encounter (Signed)
Please call patient Mike Day, Mike and kidney functions are normal.  Electrolytes normal. Blood cell counts are normal. B12, folate and vitamin D levels are all great.   -Mike urine was positive for moderate burden of calcium oxalate crystals, which are present typically when kidney stones are present.   -Since he is having pain over Mike kidney areas, I have ordered a CT of Mike kidneys to evaluate for any stones present causing Mike discomfort.  They will call him to get this scheduled ASAP.  Mike urine did not look infectious, and there was no blood in Mike urine.  Once we get imaging study back we will discuss further follow-up.  For now I would recommend following up in 4 weeks on new Paxil medication start

## 2023-03-16 ENCOUNTER — Encounter: Payer: Self-pay | Admitting: Family Medicine

## 2023-03-17 ENCOUNTER — Telehealth: Payer: Self-pay | Admitting: Family Medicine

## 2023-03-17 ENCOUNTER — Ambulatory Visit
Admission: RE | Admit: 2023-03-17 | Discharge: 2023-03-17 | Disposition: A | Discharge status: Home / Self Care | Payer: Medicare Other | Source: Ambulatory Visit | Attending: Family Medicine | Admitting: Family Medicine

## 2023-03-17 DIAGNOSIS — R109 Unspecified abdominal pain: Secondary | ICD-10-CM | POA: Diagnosis not present

## 2023-03-17 DIAGNOSIS — R82998 Other abnormal findings in urine: Secondary | ICD-10-CM

## 2023-03-17 DIAGNOSIS — I700 Atherosclerosis of aorta: Secondary | ICD-10-CM | POA: Diagnosis not present

## 2023-03-17 DIAGNOSIS — I517 Cardiomegaly: Secondary | ICD-10-CM | POA: Diagnosis not present

## 2023-03-17 DIAGNOSIS — K5730 Diverticulosis of large intestine without perforation or abscess without bleeding: Secondary | ICD-10-CM | POA: Diagnosis not present

## 2023-03-17 DIAGNOSIS — Z8546 Personal history of malignant neoplasm of prostate: Secondary | ICD-10-CM | POA: Diagnosis not present

## 2023-03-17 MED ORDER — ACETAMINOPHEN-CODEINE 300-30 MG PO TABS: 1.0000 | ORAL_TABLET | Freq: Three times a day (TID) | ORAL | 0 refills | Status: AC | PRN

## 2023-03-17 NOTE — Telephone Encounter (Signed)
Wife states tht the enlarged heart is not new, pt had MRI in May that addressed that. She also states that she does not want and will not have pt take paxil because of side effects. Please advise

## 2023-03-17 NOTE — Telephone Encounter (Signed)
Mike Day had a CT scan today and is in pain from the kidney stones. He is requesting something be called in for the pain. Please give the patient a call to discuss.

## 2023-03-17 NOTE — Telephone Encounter (Signed)
Will be addressed with result note

## 2023-03-17 NOTE — Telephone Encounter (Signed)
Please call patient Results of his CT has returned.   He did not have any appreciable collections of kidney stones.  Encouraged him to increase his hydration to flush out his kidneys to clear the crystals.  Incidental findings appreciated: Heart is mildly enlarged, this is new or increased since 2021.  Would encourage him to discuss this with his cardiology team.  Colon:  Severe sigmoid diverticulosis, moderate descending and transverse colon diverticulosis.  However there appears to be no inflammation or infection present  Back:  Severe L1 compression fracture and moderate L5 compression fracture. Progressed  degeneration of the L3-L4 spinous processes.   I did call in Tylenol 3 with codeine for him to take for discomfort. Would encourage him to follow-up in 1-2 weeks if needing further pain management.  Since by law, we are unable to prescribe more than 5 days for acute pain.

## 2023-03-18 ENCOUNTER — Other Ambulatory Visit: Payer: Self-pay | Admitting: Cardiovascular Disease

## 2023-03-20 DIAGNOSIS — L821 Other seborrheic keratosis: Secondary | ICD-10-CM | POA: Diagnosis not present

## 2023-03-20 DIAGNOSIS — C44629 Squamous cell carcinoma of skin of left upper limb, including shoulder: Secondary | ICD-10-CM | POA: Diagnosis not present

## 2023-03-20 DIAGNOSIS — C44219 Basal cell carcinoma of skin of left ear and external auricular canal: Secondary | ICD-10-CM | POA: Diagnosis not present

## 2023-03-20 DIAGNOSIS — L570 Actinic keratosis: Secondary | ICD-10-CM | POA: Diagnosis not present

## 2023-03-20 DIAGNOSIS — Z85828 Personal history of other malignant neoplasm of skin: Secondary | ICD-10-CM | POA: Diagnosis not present

## 2023-03-20 DIAGNOSIS — L080 Pyoderma: Secondary | ICD-10-CM | POA: Diagnosis not present

## 2023-03-20 NOTE — Telephone Encounter (Signed)
I understand his wife is assisting him, but this medication was discussed with patient and he agreed he desired to tried med. Paxil at low dose dose not have associated side effects, different from most drugs  in this category.  If patient does not desire to try med any longer, but does still want treatment - we can try a different med. But if a "potential side effect" is the concern, I would need to know what that concerning side effect is.  Please reassure patient - Potential side effects does not mean pt will have side effects, and most do not at this low dose.

## 2023-03-20 NOTE — Telephone Encounter (Signed)
Pt would like to be called back in 30 minutes when he is with his wife

## 2023-03-20 NOTE — Telephone Encounter (Signed)
Prescription refill request for Eliquis received. Indication:afib Last office visit:4/24 Scr:0.85  7/24 Age: 77 Weight:89  kg  Prescription refilled

## 2023-03-20 NOTE — Telephone Encounter (Signed)
Spoke with patient regarding results/recommendations.  

## 2023-03-27 DIAGNOSIS — H00022 Hordeolum internum right lower eyelid: Secondary | ICD-10-CM | POA: Diagnosis not present

## 2023-03-27 DIAGNOSIS — H40023 Open angle with borderline findings, high risk, bilateral: Secondary | ICD-10-CM | POA: Diagnosis not present

## 2023-03-28 DIAGNOSIS — S29012A Strain of muscle and tendon of back wall of thorax, initial encounter: Secondary | ICD-10-CM | POA: Diagnosis not present

## 2023-03-28 DIAGNOSIS — M9902 Segmental and somatic dysfunction of thoracic region: Secondary | ICD-10-CM | POA: Diagnosis not present

## 2023-03-28 DIAGNOSIS — S138XXA Sprain of joints and ligaments of other parts of neck, initial encounter: Secondary | ICD-10-CM | POA: Diagnosis not present

## 2023-03-28 DIAGNOSIS — S39012A Strain of muscle, fascia and tendon of lower back, initial encounter: Secondary | ICD-10-CM | POA: Diagnosis not present

## 2023-03-28 DIAGNOSIS — M9903 Segmental and somatic dysfunction of lumbar region: Secondary | ICD-10-CM | POA: Diagnosis not present

## 2023-03-28 DIAGNOSIS — M9901 Segmental and somatic dysfunction of cervical region: Secondary | ICD-10-CM | POA: Diagnosis not present

## 2023-04-03 ENCOUNTER — Telehealth: Payer: Self-pay

## 2023-04-03 DIAGNOSIS — S32010G Wedge compression fracture of first lumbar vertebra, subsequent encounter for fracture with delayed healing: Secondary | ICD-10-CM

## 2023-04-03 DIAGNOSIS — M546 Pain in thoracic spine: Secondary | ICD-10-CM

## 2023-04-03 NOTE — Telephone Encounter (Signed)
Pt was seen on 03/14/23 and wants to see if he can get a referral sent to [facility] due to back pain.

## 2023-04-03 NOTE — Telephone Encounter (Signed)
Please clarify with the patient, what is he desiring as far as the dry needling. 1. is this something that would be done at [facility] physical therapy or is there another referral that he is looking into.  The last time he and I had an appointment a few weeks ago, he was considering going to an acupuncturist.  If he is needing a referral or an order in any way for dry needling specifically, I need to know where it is to go and what exactly they were requiring to have in order to complete the dry needling.  The reason being is, dry needling is not a specific "prescription, "that is written.  So it need clarification to ensure he is getting what he needs to have it completed.

## 2023-04-04 NOTE — Telephone Encounter (Signed)
Attempted to contact pt and was unable to LVM 

## 2023-04-04 NOTE — Telephone Encounter (Signed)
See other encounter.

## 2023-04-04 NOTE — Telephone Encounter (Signed)
Mike Day, CMA      04/04/23  3:58 PM Note Spoke with pt and he does want to try the dry needling at [facility]. He states he did try the acupuncture but they do not except his insurance and he does not feel like it was effective. [facility] does take his insurance.

## 2023-04-04 NOTE — Telephone Encounter (Signed)
Spoke with pt and he does want to try the dry needling at [facility]. He states he did try the acupuncture but they do not except his insurance and he does not feel like it was effective. [facility] does take his insurance.

## 2023-04-05 NOTE — Telephone Encounter (Signed)
Order placed

## 2023-04-05 NOTE — Addendum Note (Signed)
Addended by: Felix Pacini A on: 04/05/2023 12:10 PM   Modules accepted: Orders

## 2023-04-05 NOTE — Telephone Encounter (Signed)
noted 

## 2023-04-12 DIAGNOSIS — H00022 Hordeolum internum right lower eyelid: Secondary | ICD-10-CM | POA: Diagnosis not present

## 2023-04-12 DIAGNOSIS — H16221 Keratoconjunctivitis sicca, not specified as Sjogren's, right eye: Secondary | ICD-10-CM | POA: Diagnosis not present

## 2023-04-12 DIAGNOSIS — H40023 Open angle with borderline findings, high risk, bilateral: Secondary | ICD-10-CM | POA: Diagnosis not present

## 2023-04-14 ENCOUNTER — Telehealth: Payer: Self-pay

## 2023-04-14 DIAGNOSIS — M545 Low back pain, unspecified: Secondary | ICD-10-CM | POA: Diagnosis not present

## 2023-04-14 NOTE — Telephone Encounter (Signed)
Received PT eval form. Placed on PCP desk for signature

## 2023-04-17 NOTE — Telephone Encounter (Signed)
Completed and placed in CMA work basket 

## 2023-04-17 NOTE — Telephone Encounter (Signed)
Faxed

## 2023-04-19 DIAGNOSIS — M545 Low back pain, unspecified: Secondary | ICD-10-CM | POA: Diagnosis not present

## 2023-04-20 DIAGNOSIS — Z85828 Personal history of other malignant neoplasm of skin: Secondary | ICD-10-CM | POA: Diagnosis not present

## 2023-04-20 DIAGNOSIS — C44219 Basal cell carcinoma of skin of left ear and external auricular canal: Secondary | ICD-10-CM | POA: Diagnosis not present

## 2023-04-25 DIAGNOSIS — M545 Low back pain, unspecified: Secondary | ICD-10-CM | POA: Diagnosis not present

## 2023-04-27 ENCOUNTER — Other Ambulatory Visit: Payer: Medicare Other

## 2023-04-27 DIAGNOSIS — M545 Low back pain, unspecified: Secondary | ICD-10-CM | POA: Diagnosis not present

## 2023-04-28 ENCOUNTER — Other Ambulatory Visit: Payer: Medicare Other

## 2023-05-02 DIAGNOSIS — M545 Low back pain, unspecified: Secondary | ICD-10-CM | POA: Diagnosis not present

## 2023-05-04 DIAGNOSIS — M545 Low back pain, unspecified: Secondary | ICD-10-CM | POA: Diagnosis not present

## 2023-05-07 ENCOUNTER — Other Ambulatory Visit: Payer: Self-pay | Admitting: Family Medicine

## 2023-05-09 ENCOUNTER — Encounter: Payer: Self-pay | Admitting: Family Medicine

## 2023-05-09 DIAGNOSIS — M545 Low back pain, unspecified: Secondary | ICD-10-CM | POA: Diagnosis not present

## 2023-05-10 ENCOUNTER — Telehealth: Payer: Self-pay | Admitting: *Deleted

## 2023-05-10 DIAGNOSIS — M25522 Pain in left elbow: Secondary | ICD-10-CM | POA: Diagnosis not present

## 2023-05-10 NOTE — Telephone Encounter (Signed)
   Pre-operative Risk Assessment    Patient Name: Mike Day  DOB: Aug 29, 1946 MRN: [id number]    DATE OF LAST VISIT: 12/20/22 Tereso Newcomer, PAC  DATE OF NEXT VISIT: 06/02/23 Tereso Newcomer, [facility]   Request for Surgical Clearance    Procedure:   LEFT ELBOW HARDWARE REMOVAL   Date of Surgery:  Clearance TBD                                 Surgeon:  DR. Margarita Rana Surgeon's Group or Practice Name:  [facility] Phone number:  [phone] [phone] ATTN: KELLY HIGH Fax number:  [phone]   Type of Clearance Requested:   - Medical  - Pharmacy:  Hold Apixaban (Eliquis)     Type of Anesthesia:   CHOICE   Additional requests/questions:    Elpidio Anis   05/10/2023, 5:49 PM

## 2023-05-11 DIAGNOSIS — M545 Low back pain, unspecified: Secondary | ICD-10-CM | POA: Diagnosis not present

## 2023-05-11 NOTE — Telephone Encounter (Signed)
Patient with diagnosis of atrial fibrillation on Eliquis for anticoagulation.    Procedure: left elbow hardware removal Date of procedure: TBD   CHA2DS2-VASc Score = 5   This indicates a 7.2% annual risk of stroke. The patient's score is based upon: CHF History: 0 HTN History: 1 Diabetes History: 1 Stroke History: 0 Vascular Disease History: 1 Age Score: 2 Gender Score: 0   CrCl 92 Platelet count 190  Per office protocol, patient can hold Eliquis for 2 days prior to procedure.   Patient will not need bridging with Lovenox (enoxaparin) around procedure.  **This guidance is not considered finalized until pre-operative APP has relayed final recommendations.**

## 2023-05-11 NOTE — Telephone Encounter (Signed)
Pt has appt with Tereso Newcomer, [facility] 06/02/23. Procedure is TBD. I will d/w pre op APP ok to defer clearance to provider at appt.

## 2023-05-11 NOTE — Telephone Encounter (Signed)
     Name: Mike Day  DOB: September 26, 1945  MRN: [id number]  Primary Cardiologist: Tonny Bollman, MD  Chart reviewed as part of pre-operative protocol coverage. The patient has an upcoming visit scheduled with Tereso Newcomer PA-C on 06/02/23 at which time clearance can be addressed in case there are any issues that would impact surgical recommendations.  I will route this message as FYI to requesting party and remove this message from the preop box as separate preop APP input not needed at this time.   Patient with diagnosis of atrial fibrillation on Eliquis for anticoagulation.     Procedure: left elbow hardware removal Date of procedure: TBD     CHA2DS2-VASc Score = 5   This indicates a 7.2% annual risk of stroke. The patient's score is based upon: CHF History: 0 HTN History: 1 Diabetes History: 1 Stroke History: 0 Vascular Disease History: 1 Age Score: 2 Gender Score: 0   CrCl 92 Platelet count 190   Per office protocol, patient can hold Eliquis for 2 days prior to procedure.   Patient will not need bridging with Lovenox (enoxaparin) around procedure.   **This guidance is not considered finalized until pre-operative APP has relayed final recommendations.**  Please call with any questions.  Perlie Gold, PA-C  05/11/2023, 8:50 AM

## 2023-05-11 NOTE — Telephone Encounter (Signed)
    Pharmacy please advise on holding Eliquis prior to LEFT ELBOW HARDWARE REMOVAL, date TBD. Thank you.    Perlie Gold, PA-C

## 2023-05-16 DIAGNOSIS — M545 Low back pain, unspecified: Secondary | ICD-10-CM | POA: Diagnosis not present

## 2023-05-18 DIAGNOSIS — M545 Low back pain, unspecified: Secondary | ICD-10-CM | POA: Diagnosis not present

## 2023-05-23 DIAGNOSIS — M545 Low back pain, unspecified: Secondary | ICD-10-CM | POA: Diagnosis not present

## 2023-05-25 DIAGNOSIS — M545 Low back pain, unspecified: Secondary | ICD-10-CM | POA: Diagnosis not present

## 2023-05-26 ENCOUNTER — Encounter: Payer: Self-pay | Admitting: Family Medicine

## 2023-05-26 ENCOUNTER — Ambulatory Visit (INDEPENDENT_AMBULATORY_CARE_PROVIDER_SITE_OTHER): Payer: Medicare Other | Admitting: Family Medicine

## 2023-05-26 VITALS — BP 143/78 | HR 64 | Temp 97.9°F | Wt 196.0 lb

## 2023-05-26 DIAGNOSIS — I10 Essential (primary) hypertension: Secondary | ICD-10-CM | POA: Diagnosis not present

## 2023-05-26 DIAGNOSIS — I4821 Permanent atrial fibrillation: Secondary | ICD-10-CM | POA: Diagnosis not present

## 2023-05-26 DIAGNOSIS — D6869 Other thrombophilia: Secondary | ICD-10-CM | POA: Diagnosis not present

## 2023-05-26 DIAGNOSIS — M545 Low back pain, unspecified: Secondary | ICD-10-CM

## 2023-05-26 DIAGNOSIS — I700 Atherosclerosis of aorta: Secondary | ICD-10-CM

## 2023-05-26 DIAGNOSIS — R7303 Prediabetes: Secondary | ICD-10-CM

## 2023-05-26 DIAGNOSIS — M5136 Other intervertebral disc degeneration, lumbar region: Secondary | ICD-10-CM

## 2023-05-26 DIAGNOSIS — Z23 Encounter for immunization: Secondary | ICD-10-CM | POA: Diagnosis not present

## 2023-05-26 DIAGNOSIS — E7800 Pure hypercholesterolemia, unspecified: Secondary | ICD-10-CM

## 2023-05-26 DIAGNOSIS — M8589 Other specified disorders of bone density and structure, multiple sites: Secondary | ICD-10-CM | POA: Diagnosis not present

## 2023-05-26 DIAGNOSIS — S32010G Wedge compression fracture of first lumbar vertebra, subsequent encounter for fracture with delayed healing: Secondary | ICD-10-CM | POA: Diagnosis not present

## 2023-05-26 DIAGNOSIS — E559 Vitamin D deficiency, unspecified: Secondary | ICD-10-CM | POA: Diagnosis not present

## 2023-05-26 DIAGNOSIS — E538 Deficiency of other specified B group vitamins: Secondary | ICD-10-CM

## 2023-05-26 LAB — LIPID PANEL
Cholesterol: 163 mg/dL (ref 0–200)
HDL: 59.4 mg/dL (ref >39.00)
LDL Cholesterol: 88 mg/dL (ref 0–99)
NonHDL: 103.13
Total CHOL/HDL Ratio: 3
Triglycerides: 75 mg/dL (ref 0.0–149.0)
VLDL: 15 mg/dL (ref 0.0–40.0)

## 2023-05-26 LAB — MICROALBUMIN / CREATININE URINE RATIO
Creatinine,U: 133.9 mg/dL
Microalb Creat Ratio: 1.4 mg/g (ref 0.0–30.0)
Microalb, Ur: 1.9 mg/dL (ref 0.0–1.9)

## 2023-05-26 LAB — HEMOGLOBIN A1C: Hgb A1c MFr Bld: 7 % — ABNORMAL HIGH (ref 4.6–6.5)

## 2023-05-26 MED ORDER — ALENDRONATE SODIUM 70 MG PO TABS: 70.0000 mg | ORAL_TABLET | ORAL | 11 refills | Status: DC

## 2023-05-26 MED ORDER — TIZANIDINE HCL 2 MG PO CAPS: 2.0000 mg | ORAL_CAPSULE | Freq: Three times a day (TID) | ORAL | 2 refills | Status: DC

## 2023-05-26 NOTE — Progress Notes (Signed)
Mike Day , 1946/04/23, 77 y.o., male MRN: [id number] Patient Care Team    Relationship Specialty Notifications Start End  Natalia Leatherwood, DO PCP - General Family Medicine  06/11/18   Tonny Bollman, MD PCP - Cardiology Cardiology  08/21/20   Samson Frederic, MD Consulting Physician Orthopedic Surgery  10/03/15   Kennon Rounds Physician Assistant Cardiology  04/29/16   Venancio Poisson, MD Consulting Physician Dermatology  08/24/17   Esaw Dace, MD Attending Physician Urology  06/11/18   Sallye Lat, MD Consulting Physician Ophthalmology  06/11/18   Myra Rude, MD (Inactive) Consulting Physician Sports Medicine  01/21/20   Sheral Apley, MD Consulting Physician Orthopedic Surgery  07/11/22     Chief Complaint  Patient presents with   Prediabetes     Subjective: Mike Day is a 77 y.o. male present for routine chronic condition follow-up Essential hypertension/hyperlipidemia/permanent atrial fibrillation/aortic atherosclerosis Pt reports compliance with lisinopril, , Zocor described by cardiology.  Blood pressures ranges at home normal. Patient denies chest pain, shortness of breath, dizziness or lower extremity edema.   Diabetes mellitus without complication (HCC)> prediabetes: Patient has been able to control his A1c with routine exercise and diet alone.  He is now in the prediabetic range and has remained stable. Patient denies dizziness, hyperglycemic or hypoglycemic events. Patient denies numbness, tingling in the extremities or nonhealing wounds of feet.   B12/vit d def Supplementing with vitamin D and B12.  Osteopenia: started fosamax-tolerating. Dexa due jan.  Back pain/compression fracture: He would like refills on zanaflex 2 mg- uses occasionally. Performing PT.  Patient has now established with sports med and had his first OMT.  He had planned to schedule routinely.     05/26/2023    9:13 AM 03/14/2023   11:41 AM 10/21/2022    12:53 PM 01/29/2022    9:33 AM 06/22/2021   11:03 AM  Depression screen PHQ 2/9  Decreased Interest 0 3 0 0 0  Down, Depressed, Hopeless 1 3 0 0 0  PHQ - 2 Score 1 6 0 0 0  Altered sleeping 0 3     Tired, decreased energy 1 2     Change in appetite 0 1     Feeling bad or failure about yourself  1 3     Trouble concentrating 0 2     Moving slowly or fidgety/restless 0 3     Suicidal thoughts 0 3     PHQ-9 Score 3 23     Difficult doing work/chores Not difficult at all Very difficult       Allergies  Allergen Reactions   Levofloxacin Rash   Neosporin [Neomycin-Bacitracin Zn-Polymyx] Other (See Comments)    Dry/scaly skin/blistering    Tape Other (See Comments)    ADHESIVE TAPE; BLISTERS,   Social History   Social History Narrative   Married, 1 son, 2 grand-daughters.   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   Alcohol: 1 glass red wine per night.   Was once a long distance runner.   Past Medical History:  Diagnosis Date   Anemia    Early 20's   Atrial fibrillation (HCC) 10/07/2014   3-14 day cardiac monitor 03/2019:  AFib, Avg HR 69, no significant pauses, PVCs, wide complex runs (aberrancy vs NSVT - longest 32 beats).   Atrial flutter (HCC) 2008   Status post RFCA Dr Lewayne Bunting  2008   Carotid artery plaque    Carotid US 8/21: Bilat ICA 1-39   Chronic atrial fibrillation (HCC)    Eliquis   Closed fracture of left olecranon process 07/13/2022   Closed nondisplaced fracture of left patella 01/20/2020   Closed nondisplaced intertrochanteric fracture of left femur (HCC) 07/13/2022   Depression, major, single episode, mild (HCC) 03/15/2023   Dermatochalasis of both upper eyelids 07/01/2014   Dysrhythmia    a fib    Hip fracture (HCC) 07/10/2022   Hx of adenomatous colonic polyps    Hyperlipidemia    Hypertension    LVH (left ventricular hypertrophy) 11/02/2022   LVH  TTE 08/2013: severe basal septal LVH  TTE 03/2019:  severe asymmetric LVH, EF 60-65, RVSP 24.6  TTE 10/28/22: EF 60, no RWMA, severe asymmetric LVH, NL RVSF, severe RVE, mild LAE, severe RAE, mild MR, AV sclerosis, asc aorta 41 mm, RAP 8  CMR 01/25/23: EF 57, mod ASH, RVEF 44, no significant LGE   Malignant neoplasm of prostate (HCC) 05/18/2012   Neuromuscular disorder (HCC)    neuropathy feet   Night sweats 12/10/2021   Patellar sleeve fracture of left knee 2021   Prostate cancer (HCC) 2006   Prostatectomy   Shingles 01/2019   left side of face/eye/neck/ear    SKIN CANCER, HX OF 03/11/2009   Moh's L hand for Squamous Cell;2 other squamous cells removed w/o Mohs 2013 [name]  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   Ulcer of lower extremity (HCC) 10/21/2022   Past Surgical History:  Procedure Laterality Date   BLADDER SUSPENSION  2010   COLONOSCOPY W/ POLYPECTOMY  2003;2010;04/2014   Tubular adenoma: recall 5 yrs (after 04/2019)   EYE SURGERY Bilateral 2021,2022   FEMUR IM NAIL Left 07/12/2022   Procedure: INTRAMEDULLARY (IM) NAIL FEMORAL;  Surgeon: Sheral Apley, MD;  Location: [facility];  Service: Orthopedics;  Laterality: Left;   HERNIA REPAIR  07/2008   Dr.Martin   INGUINAL HERNIA REPAIR Left 08/25/2020   Procedure: OPEN LEFT INGUINAL HERNIA REPAIR WITH MESH;  Surgeon: Luretha Murphy, MD;  Location: [facility];  Service: General;  Laterality: Left;   MOHS SURGERY     left hand   MOHS SURGERY Left 12/2020   ear   ORIF ELBOW FRACTURE Left 07/14/2022   Procedure: OPEN REDUCTION INTERNAL FIXATION (ORIF) OF LEFT ELBOW;  Surgeon: Sheral Apley, MD;  Location: [facility];  Service: Orthopedics;  Laterality: Left;   PROSTATECTOMY  2006   Robotic for adenocarcinoma; Dr Darvin Neighbours   RADIOFREQUENCY ABLATION  01/15/2007   for ectopic atrial foci   SEPTOPLASTY  Age 43   TRANSTHORACIC ECHOCARDIOGRAM  09/02/2013   Normal.  EF 60%.  LAE and RAE.   XI ROBOTIC ASSISTED INGUINAL HERNIA REPAIR WITH MESH Right 01/11/2021   Procedure: XI  ROBOTIC ASSISTED  RIGHT INGUINAL HERNIA REPAIR WITH MESH;  Surgeon: Luretha Murphy, MD;  Location: [facility];  Service: General;  Laterality: Right;   Family History  Problem Relation Age of Onset   Lung disease Father        Black Lung   Heart disease Father        Congenital   Emphysema Father    COPD Father    Diabetes Mother    Stroke Mother 19   Hypertension Brother    Heart disease Brother    Hyperlipidemia Brother    Heart attack Brother 59       sudden  death   Cancer Neg Hx    Colon cancer Neg Hx    Esophageal cancer Neg Hx    Rectal cancer Neg Hx    Stomach cancer Neg Hx    Allergies as of 05/26/2023       Reactions   Levofloxacin Rash   Neosporin [neomycin-bacitracin Zn-polymyx] Other (See Comments)   Dry/scaly skin/blistering    Tape Other (See Comments)   ADHESIVE TAPE; BLISTERS,        Medication List        Accurate as of May 26, 2023  3:28 PM. If you have any questions, ask your nurse or doctor.          STOP taking these medications    PARoxetine 10 MG tablet Commonly known as: PAXIL Stopped by: Felix Pacini       TAKE these medications    acetaminophen 500 MG tablet Commonly known as: TYLENOL Take 1,000 mg by mouth every 8 (eight) hours as needed for moderate pain.   alendronate 70 MG tablet Commonly known as: FOSAMAX Take 1 tablet (70 mg total) by mouth every 7 (seven) days. Take with a full glass of water on an empty stomach.   cetaphil cream Apply 1 application topically daily.   cholecalciferol 25 MCG (1000 UNIT) tablet Commonly known as: VITAMIN D3 Take 2 tablets (2,000 Units total) by mouth daily.   Eliquis 5 MG Tabs tablet Generic drug: apixaban TAKE 1 TABLET TWICE A DAY   furosemide 20 MG tablet Commonly known as: LASIX Take 1 tablet (20 mg total) by mouth daily.   lisinopril 20 MG tablet Commonly known as: ZESTRIL TAKE 1 TABLET DAILY   simvastatin 20 MG tablet Commonly known as: ZOCOR TAKE 1 TABLET AT  BEDTIME   tizanidine 2 MG capsule Commonly known as: Zanaflex Take 1 capsule (2 mg total) by mouth 3 (three) times daily. Started by: Felix Pacini   triamcinolone 55 MCG/ACT Aero nasal inhaler Commonly known as: NASACORT Place 1 spray into the nose daily as needed (allergies).        All past medical history, surgical history, allergies, family history, immunizations andmedications were updated in the EMR today and reviewed under the history and medication portions of their EMR.     ROS: Negative, with the exception of above mentioned in HPI   Objective:  BP (!) 143/78   Pulse 64   Temp 97.9 F (36.6 C)   Wt 196 lb (88.9 kg)   SpO2 99%   BMI 26.58 kg/m  Body mass index is 26.58 kg/m. Physical Exam Vitals and nursing note reviewed.  Constitutional:      General: He is not in acute distress.    Appearance: Normal appearance. He is not ill-appearing, toxic-appearing or diaphoretic.  HENT:     Head: Normocephalic and atraumatic.  Eyes:     General: No scleral icterus.       Right eye: No discharge.        Left eye: No discharge.     Extraocular Movements: Extraocular movements intact.     Pupils: Pupils are equal, round, and reactive to light.  Cardiovascular:     Rate and Rhythm: Normal rate and regular rhythm.  Pulmonary:     Effort: Pulmonary effort is normal. No respiratory distress.     Breath sounds: Normal breath sounds. No wheezing, rhonchi or rales.  Musculoskeletal:     Cervical back: Neck supple.     Right lower leg: No edema.  Left lower leg: No edema.  Lymphadenopathy:     Cervical: No cervical adenopathy.  Skin:    General: Skin is warm and dry.     Coloration: Skin is not jaundiced or pale.     Findings: No rash.  Neurological:     Mental Status: He is alert and oriented to person, place, and time. Mental status is at baseline.  Psychiatric:        Mood and Affect: Mood normal.        Behavior: Behavior normal.        Thought Content:  Thought content normal.        Judgment: Judgment normal.    No results found. No results found. No results found for this or any previous visit (from the past 24 hour(s)).   Assessment/Plan: Mike Day is a 77 y.o. male present for OV for  prediabetes Urine microalbumin collected today A1c collected today Follow-up every 6 months  Essential hypertension/Chronic anticoagulation/A. Fib/Aortic atherosclerosis (HCC) Stable.had not taken meds yet today Established with cardiology.  Continue routine follow-ups. Asked him to call in in 1 week with home pressures.  Back pain/compression fractures: Continue PT Dry needling has helped.  Est with sports med . Refilled zanaflex.  CT abd 2024 confirmed lumbar compression fx and progression of DDD Musculoskeletal: Severe L1 compression fracture with vertebra plana and moderate L5 compression fracture were present on the radiographs last year, new since 2021. Background generalized osteopenia. Progressed sclerotic degeneration of the L3-L4 spinous processes. Referral placed to PMR/ramos  Osteopenia, unspecified location/Vitamin D deficiency Patient is supplementing vitamin D, last lab 08/2021 with a vitamin D level of 25 DEXA completed 09/07/2021> -2.4 >tolerating Fosamax weekly  B12 deficiency B12 level 08/2021 well supplemented with the B12 is 683    Reviewed expectations re: course of current medical issues. Discussed self-management of symptoms. Outlined signs and symptoms indicating need for more acute intervention. Patient verbalized understanding and all questions were answered. Patient received an After-Visit Summary.    Orders Placed This Encounter  Procedures   DG Bone Density   Flu Vaccine Trivalent High Dose (Fluad)   Urine Microalbumin w/creat. ratio   Lipid panel   Hemoglobin A1c   Ambulatory referral to Physical Medicine Rehab   Meds ordered this encounter  Medications   alendronate (FOSAMAX) 70 MG  tablet    Sig: Take 1 tablet (70 mg total) by mouth every 7 (seven) days. Take with a full glass of water on an empty stomach.    Dispense:  4 tablet    Refill:  11   tizanidine (ZANAFLEX) 2 MG capsule    Sig: Take 1 capsule (2 mg total) by mouth 3 (three) times daily.    Dispense:  90 capsule    Refill:  2    Referral Orders         Ambulatory referral to Physical Medicine Rehab       Note is dictated utilizing voice recognition software. Although note has been proof read prior to signing, occasional typographical errors still can be missed. If any questions arise, please do not hesitate to call for verification.   electronically signed by:  Felix Pacini, DO  [facility] - OR

## 2023-05-26 NOTE — Patient Instructions (Addendum)
Return in about 24 weeks (around 11/10/2023) for Routine chronic condition follow-up.   call in within 1 week with home pressures.     Great to see you today.  I have refilled the medication(s) we provide.   If labs were collected or images ordered, we will inform you of  results once we have received them and reviewed. We will contact you either by echart message, or telephone call.  Please give ample time to the testing facility, and our office to run,  receive and review results. Please do not call inquiring of results, even if you can see them in your chart. We will contact you as soon as we are able. If it has been over 1 week since the test was completed, and you have not yet heard from [place], then please call us.    - echart message- for normal results that have been seen by the patient already.   - telephone call: abnormal results or if patient has not viewed results in their echart.  If a referral to a specialist was entered for you, please call us in 2 weeks if you have not heard from the specialist office to schedule.

## 2023-05-30 DIAGNOSIS — M545 Low back pain, unspecified: Secondary | ICD-10-CM | POA: Diagnosis not present

## 2023-06-01 ENCOUNTER — Ambulatory Visit (HOSPITAL_BASED_OUTPATIENT_CLINIC_OR_DEPARTMENT_OTHER)
Admission: RE | Admit: 2023-06-01 | Discharge: 2023-06-01 | Disposition: A | Discharge status: Home / Self Care | Payer: Medicare Other | Source: Ambulatory Visit | Attending: Family Medicine | Admitting: Family Medicine

## 2023-06-01 ENCOUNTER — Encounter (HOSPITAL_BASED_OUTPATIENT_CLINIC_OR_DEPARTMENT_OTHER): Payer: Self-pay

## 2023-06-01 ENCOUNTER — Other Ambulatory Visit (HOSPITAL_BASED_OUTPATIENT_CLINIC_OR_DEPARTMENT_OTHER): Payer: Self-pay

## 2023-06-01 DIAGNOSIS — M8589 Other specified disorders of bone density and structure, multiple sites: Secondary | ICD-10-CM

## 2023-06-01 DIAGNOSIS — M545 Low back pain, unspecified: Secondary | ICD-10-CM | POA: Diagnosis not present

## 2023-06-01 MED ORDER — RSVPREF3 VAC RECOMB ADJUVANTED 120 MCG/0.5ML IM SUSR
0.5000 mL | Freq: Once | INTRAMUSCULAR | 0 refills | Status: AC
  Filled 2023-06-01: qty 0.5, 1d supply, fill #0

## 2023-06-01 NOTE — Progress Notes (Addendum)
Cardiology Office Note:    Date:  06/02/2023  ID:  STEED BABBIT, DOB May 11, 1946, MRN [id number] PCP: Natalia Leatherwood, DO  [facility] Providers Cardiologist:  Tonny Bollman, MD Cardiology APP:  Beatrice Lecher, PA-C       Patient Profile:      AFlutter s/p RFCA Permanent AFib CHADS2-VASc=3 (DM, HTN, age x1). Apixaban Rx PVCs Event monitor 03/2019: AFib, Avg HR 69, PVCs, wide complex rhythm (NSVT vs aberrancy) Monitor 11/2022: 100% AFib, PVCs 4.6% LVH (Left Ventricular Hypertrophy) TTE 08/2013: severe basal septal LVH TTE 03/2019: severe asymmetric LVH, EF 60-65, RVSP 24.6 TTE 10/28/22: EF 60, no RWMA, severe asymmetric LVH, NL RVSF, severe RVE, mild LAE, severe RAE, mild MR, AV sclerosis, asc aorta 41 mm, RAP 8 CMR 01/25/23: EF 57, mod ASH, RVEF 44, no significant LGE (LVH likely due to HTN) Carotid Dz [place] 03/2019: Bilat ICA 1-39 [place] 8/21: bilat ICA 1-39 Diabetes mellitus Hypertension  Hyperlipidemia  Prostate CA s/p prostatectomy Aortic atherosclerosis (CT 12/2019) Venous insufficiency           History of Present Illness:  Discussed the use of AI scribe software for clinical note transcription with the patient, who gave verbal consent to proceed.  Mike Day is a 77 y.o. male who returns for follow-up of CAD, surgical clearance.  He was last seen in 12/2022.  Follow-up cardiac MRI done for LVH demonstrated moderate asymmetric septal hypertrophy EF 57 and no LGE.  LVH was felt to most likely be due to hypertension.  He needs to undergo left elbow surgery with Dr. Margarita Rana.  Request has been to hold Eliquis.  Our Pharm.D. has recommended holding Eliquis for 2 days prior to his procedure.  He is here with his wife. He reports no chest pain or shortness of breath and is able to walk a couple of blocks and climb a flight of stairs without difficulty. His venous stasis ulcer has resolved and he is managing well with compression hose. He has not had syncope, orthopnea.  Labs  from his chart were reviewed today.  LDL optimal at 88 in September 2024.  In July 2024, hemoglobin normal at 13, creatinine normal 0.85.     Review of Systems  Gastrointestinal:  Negative for hematochezia and melena.  Genitourinary:  Negative for hematuria.  See HPI     Studies Reviewed:   EKG Interpretation Date/Time:  Friday June 02 2023 11:35:36 EDT Ventricular Rate:  75 PR Interval:    QRS Duration:  114 QT Interval:  430 QTC Calculation: 480 R Axis:   42  Text Interpretation: Atrial fibrillation with premature ventricular or aberrantly conducted complexes Incomplete right bundle branch block No significant change since last tracing Confirmed by Tereso Newcomer [phone]) on 06/02/2023 12:19:23 PM    Risk Assessment/Calculations:    CHA2DS2-VASc Score = 5   This indicates a 7.2% annual risk of stroke. The patient's score is based upon: CHF History: 0 HTN History: 1 Diabetes History: 1 Stroke History: 0 Vascular Disease History: 1 Age Score: 2 Gender Score: 0            Physical Exam:   VS:  BP 124/80 (BP Location: Right Arm, Patient Position: Sitting, Cuff Size: Large)   Pulse 75   Ht 6' (1.829 m)   Wt 194 lb 3.2 oz (88.1 kg)   SpO2 97%   BMI 26.34 kg/m    Wt Readings from Last 3 Encounters:  06/02/23 194 lb 3.2 oz (88.1  kg)  05/26/23 196 lb (88.9 kg)  03/14/23 196 lb 3.2 oz (89 kg)    Constitutional:      Appearance: Healthy appearance. Not in distress.  Neck:     Vascular: No JVR.  Pulmonary:     Breath sounds: Normal breath sounds. No wheezing. No rales.  Cardiovascular:     Normal rate. Irregularly irregular rhythm.     Murmurs: There is no murmur.  Edema:    Peripheral edema absent.  Abdominal:     Palpations: Abdomen is soft.        Assessment and Plan:   Assessment & Plan Permanent atrial fibrillation (HCC) Heart rate controlled on current regimen. Tolerating anticoagulation with Eliquis 5mg  twice daily. He is on the appropriate dose of  Eliquis (wt > 60 kg, SCr < 1.5, Age < 80). -Continue Eliquis 5mg  twice daily. Preoperative cardiovascular examination Mr. Barberi perioperative risk of a major cardiac event is 0.4% according to the Revised Cardiac Risk Index (RCRI).  Therefore, he is at low risk for perioperative complications.   His functional capacity is good at 4.31 METs according to the Duke Activity Status Index (DASI). Recommendations: According to ACC/AHA guidelines, no further cardiovascular testing needed.  The patient may proceed to surgery at acceptable risk.   Antiplatelet and/or Anticoagulation Recommendations: Eliquis (Apixaban) can be held for 2 days prior to surgery.  Please resume post op when felt to be safe.   LVH (left ventricular hypertrophy) CMR in 01/2023 with asymmetric septal hypertrophy which is likely secondary to hypertension. -No further testing. Essential hypertension Blood pressure controlled on current regimen. -Continue Lisinopril 20mg  daily and Lasix 20mg  daily. PVC's (premature ventricular contractions) Low burden of PVCs noted on monitor in March 2024. -No change in management required. Venous insufficiency of both lower extremities Resolved venous stasis ulcer. Patient managing well with compression hose. -Continue use of compression hose.      Dispo:  Return in about 6 months (around 11/30/2023) for Routine Follow Up, w/ Tereso Newcomer, PA-C.  Signed, Tereso Newcomer, PA-C

## 2023-06-02 ENCOUNTER — Encounter: Payer: Self-pay | Admitting: Physician Assistant

## 2023-06-02 ENCOUNTER — Ambulatory Visit: Payer: Medicare Other | Attending: Physician Assistant | Admitting: Physician Assistant

## 2023-06-02 VITALS — BP 124/80 | HR 75 | Ht 72.0 in | Wt 194.2 lb

## 2023-06-02 DIAGNOSIS — I517 Cardiomegaly: Secondary | ICD-10-CM | POA: Insufficient documentation

## 2023-06-02 DIAGNOSIS — Z01810 Encounter for preprocedural cardiovascular examination: Secondary | ICD-10-CM | POA: Diagnosis not present

## 2023-06-02 DIAGNOSIS — I4821 Permanent atrial fibrillation: Secondary | ICD-10-CM | POA: Diagnosis not present

## 2023-06-02 DIAGNOSIS — I493 Ventricular premature depolarization: Secondary | ICD-10-CM | POA: Insufficient documentation

## 2023-06-02 DIAGNOSIS — I10 Essential (primary) hypertension: Secondary | ICD-10-CM | POA: Diagnosis not present

## 2023-06-02 DIAGNOSIS — I872 Venous insufficiency (chronic) (peripheral): Secondary | ICD-10-CM | POA: Diagnosis not present

## 2023-06-02 NOTE — Assessment & Plan Note (Signed)
Resolved venous stasis ulcer. Patient managing well with compression hose. -Continue use of compression hose.

## 2023-06-02 NOTE — Patient Instructions (Signed)
Medication Instructions:  Your physician recommends that you continue on your current medications as directed. Please refer to the Current Medication list given to you today.  *If you need a refill on your cardiac medications before your next appointment, please call your pharmacy*   Lab Work: None ordered  If you have labs (blood work) drawn today and your tests are completely normal, you will receive your results only by: MyChart Message (if you have MyChart) OR A paper copy in the mail If you have any lab test that is abnormal or we need to change your treatment, we will call you to review the results.   Testing/Procedures: None orderd   Follow-Up: At [facility], you and your health needs are our priority.  As part of our continuing mission to provide you with exceptional heart care, we have created designated Provider Care Teams.  These Care Teams include your primary Cardiologist (physician) and Advanced Practice Providers (APPs -  Physician Assistants and Nurse Practitioners) who all work together to provide you with the care you need, when you need it.  We recommend signing up for the patient portal called "MyChart".  Sign up information is provided on this After Visit Summary.  MyChart is used to connect with patients for Virtual Visits (Telemedicine).  Patients are able to view lab/test results, encounter notes, upcoming appointments, etc.  Non-urgent messages can be sent to your provider as well.   To learn more about what you can do with MyChart, go to [email or web address].    Your next appointment:   6 month(s)  Provider:   Tereso Newcomer, PA-C         Other Instructions

## 2023-06-02 NOTE — Assessment & Plan Note (Signed)
Blood pressure controlled on current regimen. -Continue Lisinopril 20mg  daily and Lasix 20mg  daily.

## 2023-06-02 NOTE — Assessment & Plan Note (Signed)
Low burden of PVCs noted on monitor in March 2024. -No change in management required.

## 2023-06-02 NOTE — Assessment & Plan Note (Signed)
CMR in 01/2023 with asymmetric septal hypertrophy which is likely secondary to hypertension. -No further testing.

## 2023-06-02 NOTE — Assessment & Plan Note (Signed)
Heart rate controlled on current regimen. Tolerating anticoagulation with Eliquis 5mg  twice daily. He is on the appropriate dose of Eliquis (wt > 60 kg, SCr < 1.5, Age < 80). -Continue Eliquis 5mg  twice daily.

## 2023-06-13 DIAGNOSIS — M545 Low back pain, unspecified: Secondary | ICD-10-CM | POA: Diagnosis not present

## 2023-06-14 DIAGNOSIS — M5459 Other low back pain: Secondary | ICD-10-CM | POA: Diagnosis not present

## 2023-06-20 DIAGNOSIS — M545 Low back pain, unspecified: Secondary | ICD-10-CM | POA: Diagnosis not present

## 2023-06-26 DIAGNOSIS — M5459 Other low back pain: Secondary | ICD-10-CM | POA: Diagnosis not present

## 2023-06-27 DIAGNOSIS — M545 Low back pain, unspecified: Secondary | ICD-10-CM | POA: Diagnosis not present

## 2023-06-28 ENCOUNTER — Telehealth: Payer: Self-pay | Admitting: Cardiovascular Disease

## 2023-06-28 NOTE — Telephone Encounter (Signed)
Pt c/o BP issue: STAT if pt c/o blurred vision, one-sided weakness or slurred speech  1. What are your last 5 BP readings?   2. Are you having any other symptoms (ex. Dizziness, headache, blurred vision, passed out)?  No   3. What is your BP issue?   Patient states yesterday and today his BP has been elevated. Systolic is above 200 and diastolic is above 100.

## 2023-06-28 NOTE — Telephone Encounter (Signed)
Pt and his wife called to report that over the past 2 days the pts BP has been very high...   Yesterday morning  200/110 Yesterday at his dental cleaning 210/110 This morning 176/101  Prior to yesterday his BP has been good.. 115/58, 124/80 at his [place] OV with Tereso Newcomer PA.   He denies headache, chest pain, no dizziness, no SOB, no weakness on one side.[name]   He is having a surgical procedure for his back tomorrow requiring a "brachial block" and they are worried th BP will hinder the procedure.   He is only taking lasix 20 mg but today he took an extra hoping to bringing it down but has not altered it.   I suggested if he develops any symptoms or worsening BP readings that he may need to go to to the ED but his wife became very upset and declined.   I will forward to Dr Excell Seltzer for his review and recommendations.

## 2023-06-29 DIAGNOSIS — T8484XA Pain due to internal orthopedic prosthetic devices, implants and grafts, initial encounter: Secondary | ICD-10-CM | POA: Diagnosis not present

## 2023-06-29 DIAGNOSIS — T84298A Other mechanical complication of internal fixation device of other bones, initial encounter: Secondary | ICD-10-CM | POA: Diagnosis not present

## 2023-06-29 NOTE — Telephone Encounter (Signed)
If BP still elevated above 170/90 I would have him start amlodipine 5 mg daily he can start today if needed

## 2023-06-30 NOTE — Telephone Encounter (Signed)
Called and spoke with patient about MD recommendations. He states his pressure has been fine and he doesn't want to take Amlodipine as he was taken off of this due to edema. He will call back if pressures start trending up again.

## 2023-07-06 DIAGNOSIS — M47816 Spondylosis without myelopathy or radiculopathy, lumbar region: Secondary | ICD-10-CM | POA: Diagnosis not present

## 2023-07-10 DIAGNOSIS — T8484XD Pain due to internal orthopedic prosthetic devices, implants and grafts, subsequent encounter: Secondary | ICD-10-CM | POA: Diagnosis not present

## 2023-07-13 DIAGNOSIS — M47816 Spondylosis without myelopathy or radiculopathy, lumbar region: Secondary | ICD-10-CM | POA: Diagnosis not present

## 2023-08-08 DIAGNOSIS — M47816 Spondylosis without myelopathy or radiculopathy, lumbar region: Secondary | ICD-10-CM | POA: Diagnosis not present

## 2023-09-12 ENCOUNTER — Telehealth: Payer: Self-pay | Admitting: Family Medicine

## 2023-09-12 ENCOUNTER — Ambulatory Visit (HOSPITAL_BASED_OUTPATIENT_CLINIC_OR_DEPARTMENT_OTHER)
Admission: RE | Admit: 2023-09-12 | Discharge: 2023-09-12 | Disposition: A | Discharge status: Home / Self Care | Payer: Medicare Other | Source: Ambulatory Visit | Attending: Family Medicine | Admitting: Family Medicine

## 2023-09-12 DIAGNOSIS — M8589 Other specified disorders of bone density and structure, multiple sites: Secondary | ICD-10-CM | POA: Insufficient documentation

## 2023-09-12 DIAGNOSIS — M810 Age-related osteoporosis without current pathological fracture: Secondary | ICD-10-CM | POA: Diagnosis not present

## 2023-09-12 NOTE — Telephone Encounter (Signed)
 Spoke with patient regarding results/recommendations.

## 2023-09-12 NOTE — Telephone Encounter (Signed)
 Please call patient concerning the results of his bone density test. His bone density has mildly decreased since last testing.  However, now he he is in the osteoporosis range. Continue Fosamax  once weekly. Continue daily weightbearing exercises can help retain bone density. Limiting caffeine and alcoholic beverages can help decrease loss of bone density. Continue adequate vitamin D  supplementation. We will repeat DEXA scan in 2 years.   His prior T-score was -2.4, this current T-score is -2.5.  Although there was very minimal change, -2.5 is the beginning of osteoporosis.

## 2023-09-15 DIAGNOSIS — M47896 Other spondylosis, lumbar region: Secondary | ICD-10-CM | POA: Diagnosis not present

## 2023-09-15 DIAGNOSIS — M47816 Spondylosis without myelopathy or radiculopathy, lumbar region: Secondary | ICD-10-CM | POA: Diagnosis not present

## 2023-09-21 DIAGNOSIS — L570 Actinic keratosis: Secondary | ICD-10-CM | POA: Diagnosis not present

## 2023-09-21 DIAGNOSIS — D0462 Carcinoma in situ of skin of left upper limb, including shoulder: Secondary | ICD-10-CM | POA: Diagnosis not present

## 2023-09-21 DIAGNOSIS — C4442 Squamous cell carcinoma of skin of scalp and neck: Secondary | ICD-10-CM | POA: Diagnosis not present

## 2023-09-21 DIAGNOSIS — D485 Neoplasm of uncertain behavior of skin: Secondary | ICD-10-CM | POA: Diagnosis not present

## 2023-09-21 DIAGNOSIS — L821 Other seborrheic keratosis: Secondary | ICD-10-CM | POA: Diagnosis not present

## 2023-09-21 DIAGNOSIS — D0461 Carcinoma in situ of skin of right upper limb, including shoulder: Secondary | ICD-10-CM | POA: Diagnosis not present

## 2023-09-21 DIAGNOSIS — Z85828 Personal history of other malignant neoplasm of skin: Secondary | ICD-10-CM | POA: Diagnosis not present

## 2023-09-21 DIAGNOSIS — D045 Carcinoma in situ of skin of trunk: Secondary | ICD-10-CM | POA: Diagnosis not present

## 2023-09-21 DIAGNOSIS — L720 Epidermal cyst: Secondary | ICD-10-CM | POA: Diagnosis not present

## 2023-09-28 DIAGNOSIS — H00025 Hordeolum internum left lower eyelid: Secondary | ICD-10-CM | POA: Diagnosis not present

## 2023-09-29 DIAGNOSIS — M47816 Spondylosis without myelopathy or radiculopathy, lumbar region: Secondary | ICD-10-CM | POA: Diagnosis not present

## 2023-11-08 DIAGNOSIS — H40023 Open angle with borderline findings, high risk, bilateral: Secondary | ICD-10-CM | POA: Diagnosis not present

## 2023-11-08 DIAGNOSIS — H16221 Keratoconjunctivitis sicca, not specified as Sjogren's, right eye: Secondary | ICD-10-CM | POA: Diagnosis not present

## 2023-11-08 DIAGNOSIS — H00025 Hordeolum internum left lower eyelid: Secondary | ICD-10-CM | POA: Diagnosis not present

## 2023-11-10 ENCOUNTER — Ambulatory Visit: Payer: Medicare Other | Admitting: Family Medicine

## 2023-11-10 VITALS — BP 134/84 | HR 72 | Temp 98.1°F | Wt 197.8 lb

## 2023-11-10 DIAGNOSIS — R7303 Prediabetes: Secondary | ICD-10-CM | POA: Diagnosis not present

## 2023-11-10 DIAGNOSIS — G319 Degenerative disease of nervous system, unspecified: Secondary | ICD-10-CM | POA: Diagnosis not present

## 2023-11-10 DIAGNOSIS — M8000XD Age-related osteoporosis with current pathological fracture, unspecified site, subsequent encounter for fracture with routine healing: Secondary | ICD-10-CM | POA: Diagnosis not present

## 2023-11-10 DIAGNOSIS — M4000 Postural kyphosis, site unspecified: Secondary | ICD-10-CM

## 2023-11-10 DIAGNOSIS — M546 Pain in thoracic spine: Secondary | ICD-10-CM

## 2023-11-10 DIAGNOSIS — I10 Essential (primary) hypertension: Secondary | ICD-10-CM

## 2023-11-10 DIAGNOSIS — E7800 Pure hypercholesterolemia, unspecified: Secondary | ICD-10-CM

## 2023-11-10 DIAGNOSIS — I4821 Permanent atrial fibrillation: Secondary | ICD-10-CM | POA: Diagnosis not present

## 2023-11-10 DIAGNOSIS — D6869 Other thrombophilia: Secondary | ICD-10-CM

## 2023-11-10 DIAGNOSIS — S32010G Wedge compression fracture of first lumbar vertebra, subsequent encounter for fracture with delayed healing: Secondary | ICD-10-CM | POA: Diagnosis not present

## 2023-11-10 DIAGNOSIS — I700 Atherosclerosis of aorta: Secondary | ICD-10-CM | POA: Diagnosis not present

## 2023-11-10 LAB — COMPREHENSIVE METABOLIC PANEL
ALT: 11 U/L (ref 0–53)
AST: 13 U/L (ref 0–37)
Albumin: 4.4 g/dL (ref 3.5–5.2)
Alkaline Phosphatase: 62 U/L (ref 39–117)
BUN: 16 mg/dL (ref 6–23)
CO2: 29 meq/L (ref 19–32)
Calcium: 9.7 mg/dL (ref 8.4–10.5)
Chloride: 100 meq/L (ref 96–112)
Creatinine, Ser: 0.79 mg/dL (ref 0.40–1.50)
GFR: 85.64 mL/min (ref >60.00)
Glucose, Bld: 150 mg/dL — ABNORMAL HIGH (ref 70–99)
Potassium: 4.4 meq/L (ref 3.5–5.1)
Sodium: 137 meq/L (ref 135–145)
Total Bilirubin: 0.7 mg/dL (ref 0.2–1.2)
Total Protein: 6.9 g/dL (ref 6.0–8.3)

## 2023-11-10 LAB — CBC
HCT: 42.8 % (ref 39.0–52.0)
Hemoglobin: 14.2 g/dL (ref 13.0–17.0)
MCHC: 33.3 g/dL (ref 30.0–36.0)
MCV: 101.6 fl — ABNORMAL HIGH (ref 78.0–100.0)
Platelets: 195 10*3/uL (ref 150.0–400.0)
RBC: 4.21 Mil/uL — ABNORMAL LOW (ref 4.22–5.81)
RDW: 13.6 % (ref 11.5–15.5)
WBC: 5.4 10*3/uL (ref 4.0–10.5)

## 2023-11-10 LAB — HEMOGLOBIN A1C: Hgb A1c MFr Bld: 7.1 % — ABNORMAL HIGH (ref 4.6–6.5)

## 2023-11-10 MED ORDER — ALENDRONATE SODIUM 70 MG PO TABS: 70.0000 mg | ORAL_TABLET | ORAL | 11 refills | Status: DC

## 2023-11-10 NOTE — Patient Instructions (Addendum)

## 2023-11-10 NOTE — Progress Notes (Signed)
 Mike Day , 1945-12-01, 78 y.o., male MRN: [id number] Patient Care Team    Relationship Specialty Notifications Start End  Natalia Leatherwood, DO PCP - General Family Medicine  06/11/18   Tonny Bollman, MD PCP - Cardiology Cardiology  08/21/20   Samson Frederic, MD Consulting Physician Orthopedic Surgery  10/03/15   Kennon Rounds Physician Assistant Cardiology  04/29/16   Venancio Poisson, MD Consulting Physician Dermatology  08/24/17   Esaw Dace, MD Attending Physician Urology  06/11/18   Sallye Lat, MD Consulting Physician Ophthalmology  06/11/18   Myra Rude, MD Consulting Physician Sports Medicine  01/21/20   Sheral Apley, MD Consulting Physician Orthopedic Surgery  07/11/22     Chief Complaint  Patient presents with   Diabetes    Pt is fasting.      Subjective: Mike Day is a 78 y.o. male present for routine chronic condition follow-up Essential hypertension/hyperlipidemia/permanent atrial fibrillation/aortic atherosclerosis Pt reports compliance with lisinopril, , Zocor described by cardiology.  Blood pressures ranges at home normal. Patient denies chest pain, shortness of breath, dizziness or lower extremity edema.   Diabetes mellitus without complication (HCC)> prediabetes: Patient has been able to control his A1c with routine exercise and diet alone.  He is now in the prediabetic range and has remained stable. Patient denies dizziness, hyperglycemic or hypoglycemic events. Patient denies numbness, tingling in the extremities or nonhealing wounds of feet.   B12/vit d def Supplementing with vitamin D and B12.  Osteoporosis: started fosamax-tolerating. DEXA completed 09/12/2023. -2.5  Back pain/compression fracture: He would like refills on zanaflex 2 mg- uses occasionally. Performing PT.  Patient has now established with sports med and had his first OMT.  He had planned to schedule routinely.     05/26/2023    9:13 AM 03/14/2023    11:41 AM 10/21/2022   12:53 PM 01/29/2022    9:33 AM 06/22/2021   11:03 AM  Depression screen PHQ 2/9  Decreased Interest 0 3 0 0 0  Down, Depressed, Hopeless 1 3 0 0 0  PHQ - 2 Score 1 6 0 0 0  Altered sleeping 0 3     Tired, decreased energy 1 2     Change in appetite 0 1     Feeling bad or failure about yourself  1 3     Trouble concentrating 0 2     Moving slowly or fidgety/restless 0 3     Suicidal thoughts 0 3     PHQ-9 Score 3 23     Difficult doing work/chores Not difficult at all Very difficult       Allergies  Allergen Reactions   Levofloxacin Rash   Neosporin [Neomycin-Bacitracin Zn-Polymyx] Other (See Comments)    Dry/scaly skin/blistering    Tape Other (See Comments)    ADHESIVE TAPE; BLISTERS,   Social History   Social History Narrative   Married, 1 son, 2 grand-daughters.   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   Alcohol: 1 glass red wine per night.   Was once a long distance runner.   Past Medical History:  Diagnosis Date   Anemia    Early 20's   Atrial fibrillation (HCC) 10/07/2014   3-14 day cardiac monitor 03/2019:  AFib, Avg HR 69, no significant pauses, PVCs, wide complex runs (aberrancy vs NSVT - longest 32 beats).   Atrial flutter (HCC) 2008  Status post RFCA Dr Lewayne Bunting 2008   Carotid artery plaque    Carotid US 8/21: Bilat ICA 1-39   Chronic atrial fibrillation (HCC)    Eliquis   Closed fracture of left olecranon process 07/13/2022   Closed nondisplaced fracture of left patella 01/20/2020   Closed nondisplaced intertrochanteric fracture of left femur (HCC) 07/13/2022   Depression, major, single episode, mild (HCC) 03/15/2023   Dermatochalasis of both upper eyelids 07/01/2014   Dysrhythmia    a fib    Hip fracture (HCC) 07/10/2022   Hx of adenomatous colonic polyps    Hyperlipidemia    Hypertension    LVH (left ventricular hypertrophy) 11/02/2022   LVH  TTE 08/2013: severe basal septal  LVH  TTE 03/2019: severe asymmetric LVH, EF 60-65, RVSP 24.6  TTE 10/28/22: EF 60, no RWMA, severe asymmetric LVH, NL RVSF, severe RVE, mild LAE, severe RAE, mild MR, AV sclerosis, asc aorta 41 mm, RAP 8  CMR 01/25/23: EF 57, mod ASH, RVEF 44, no significant LGE   Malignant neoplasm of prostate (HCC) 05/18/2012   Neuromuscular disorder (HCC)    neuropathy feet   Night sweats 12/10/2021   Patellar sleeve fracture of left knee 2021   Prostate cancer (HCC) 2006   Prostatectomy   Shingles 01/2019   left side of face/eye/neck/ear    SKIN CANCER, HX OF 03/11/2009   Moh's L hand for Squamous Cell;2 other squamous cells removed w/o Mohs 2013 [name]  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   Ulcer of lower extremity (HCC) 10/21/2022   Past Surgical History:  Procedure Laterality Date   BLADDER SUSPENSION  2010   COLONOSCOPY W/ POLYPECTOMY  2003;2010;04/2014   Tubular adenoma: recall 5 yrs (after 04/2019)   EYE SURGERY Bilateral 2021,2022   FEMUR IM NAIL Left 07/12/2022   Procedure: INTRAMEDULLARY (IM) NAIL FEMORAL;  Surgeon: Sheral Apley, MD;  Location: [facility];  Service: Orthopedics;  Laterality: Left;   HERNIA REPAIR  07/2008   Dr.Martin   INGUINAL HERNIA REPAIR Left 08/25/2020   Procedure: OPEN LEFT INGUINAL HERNIA REPAIR WITH MESH;  Surgeon: Luretha Murphy, MD;  Location: [facility];  Service: General;  Laterality: Left;   MOHS SURGERY     left hand   MOHS SURGERY Left 12/2020   ear   ORIF ELBOW FRACTURE Left 07/14/2022   Procedure: OPEN REDUCTION INTERNAL FIXATION (ORIF) OF LEFT ELBOW;  Surgeon: Sheral Apley, MD;  Location: [facility];  Service: Orthopedics;  Laterality: Left;   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  EF 60%.  LAE and RAE.   XI ROBOTIC ASSISTED INGUINAL HERNIA REPAIR WITH MESH Right 01/11/2021    Procedure: XI ROBOTIC ASSISTED  RIGHT INGUINAL HERNIA REPAIR WITH MESH;  Surgeon: Luretha Murphy, MD;  Location: [facility];  Service: General;  Laterality: Right;   Family History  Problem Relation Age of Onset   Lung disease Father        Black Lung   Heart disease Father        Congenital   Emphysema Father    COPD Father    Diabetes Mother    Stroke Mother 44   Hypertension Brother    Heart disease Brother    Hyperlipidemia Brother    Heart attack Brother 69  sudden death   Cancer Neg Hx    Colon cancer Neg Hx    Esophageal cancer Neg Hx    Rectal cancer Neg Hx    Stomach cancer Neg Hx    Allergies as of 11/10/2023       Reactions   Levofloxacin Rash   Neosporin [neomycin-bacitracin Zn-polymyx] Other (See Comments)   Dry/scaly skin/blistering    Tape Other (See Comments)   ADHESIVE TAPE; BLISTERS,        Medication List        Accurate as of November 10, 2023 10:33 AM. If you have any questions, ask your nurse or doctor.          STOP taking these medications    tizanidine 2 MG capsule Commonly known as: Zanaflex Stopped by: Felix Pacini       TAKE these medications    acetaminophen 500 MG tablet Commonly known as: TYLENOL Take 1,000 mg by mouth every 8 (eight) hours as needed for moderate pain.   alendronate 70 MG tablet Commonly known as: FOSAMAX Take 1 tablet (70 mg total) by mouth every 7 (seven) days. Take with a full glass of water on an empty stomach.   cetaphil cream Apply 1 application topically daily.   cholecalciferol 25 MCG (1000 UNIT) tablet Commonly known as: VITAMIN D3 Take 2 tablets (2,000 Units total) by mouth daily.   Eliquis 5 MG Tabs tablet Generic drug: apixaban TAKE 1 TABLET TWICE A DAY   furosemide 20 MG tablet Commonly known as: LASIX Take 1 tablet (20 mg total) by mouth daily.   lisinopril 20 MG tablet Commonly known as: ZESTRIL TAKE 1 TABLET DAILY   simvastatin 20 MG tablet Commonly known as: ZOCOR TAKE  1 TABLET AT BEDTIME   triamcinolone 55 MCG/ACT Aero nasal inhaler Commonly known as: NASACORT Place 1 spray into the nose daily as needed (allergies).        All past medical history, surgical history, allergies, family history, immunizations andmedications were updated in the EMR today and reviewed under the history and medication portions of their EMR.     ROS: Negative, with the exception of above mentioned in HPI   Objective:  BP 134/84   Pulse 72   Temp 98.1 F (36.7 C)   Wt 197 lb 12.8 oz (89.7 kg)   SpO2 97%   BMI 26.83 kg/m  Body mass index is 26.83 kg/m. Physical Exam Vitals and nursing note reviewed.  Constitutional:      General: He is not in acute distress.    Appearance: Normal appearance. He is not ill-appearing, toxic-appearing or diaphoretic.  HENT:     Head: Normocephalic and atraumatic.  Eyes:     General: No scleral icterus.       Right eye: No discharge.        Left eye: No discharge.     Extraocular Movements: Extraocular movements intact.     Pupils: Pupils are equal, round, and reactive to light.  Cardiovascular:     Rate and Rhythm: Normal rate and regular rhythm.  Pulmonary:     Effort: Pulmonary effort is normal. No respiratory distress.     Breath sounds: Normal breath sounds. No wheezing, rhonchi or rales.  Musculoskeletal:     Cervical back: Neck supple.     Right lower leg: No edema.     Left lower leg: No edema.  Lymphadenopathy:     Cervical: No cervical adenopathy.  Skin:    General: Skin is  warm and dry.     Coloration: Skin is not jaundiced or pale.     Findings: No rash.  Neurological:     Mental Status: He is alert and oriented to person, place, and time. Mental status is at baseline.  Psychiatric:        Mood and Affect: Mood normal.        Behavior: Behavior normal.        Thought Content: Thought content normal.        Judgment: Judgment normal.    No results found. No results found. No results found for this or  any previous visit (from the past 24 hours).   Assessment/Plan: KEATH MATERA is a 78 y.o. male present for OV for Chronic Conditions/illness Management prediabetes Urine microalbumin UTD A1c collected today  Essential hypertension/Chronic anticoagulation/A. Fib/Aortic atherosclerosis (HCC) Stable Established with cardiology.  Continue routine follow-ups.  Back pain/compression fractures: Continue PT prn Dry needling has helped.  Est with sports med. CT abd 2024 confirmed lumbar compression fx and progression of DDD Musculoskeletal: Severe L1 compression fracture with vertebra plana and moderate L5 compression fracture were present on the radiographs last year, new since 2021. Background generalized osteopenia. Progressed sclerotic degeneration of the L3-L4 spinous processes. Referral placed to PMR/ramos  Osteoporosis /Vitamin D deficiency Patient is supplementing vitamin D DEXA completed 09/12/2023. -2.5>tolerating Fosamax weekly>refilled mail-in pharmacy  B12 deficiency well supplemented with the B12 is 683   Reviewed expectations re: course of current medical issues. Discussed self-management of symptoms. Outlined signs and symptoms indicating need for more acute intervention. Patient verbalized understanding and all questions were answered. Patient received an After-Visit Summary.    Orders Placed This Encounter  Procedures   CBC   Comp Met (CMET)   Hemoglobin A1c   Meds ordered this encounter  Medications   alendronate (FOSAMAX) 70 MG tablet    Sig: Take 1 tablet (70 mg total) by mouth every 7 (seven) days. Take with a full glass of water on an empty stomach.    Dispense:  4 tablet    Refill:  11    Referral Orders  No referral(s) requested today    Note is dictated utilizing voice recognition software. Although note has been proof read prior to signing, occasional typographical errors still can be missed. If any questions arise, please do not hesitate to  call for verification.   electronically signed by:  Felix Pacini, DO  [facility] - [place]

## 2023-11-13 ENCOUNTER — Telehealth: Payer: Self-pay | Admitting: Family Medicine

## 2023-11-13 NOTE — Telephone Encounter (Signed)
 Called and spoke with pt. Lab Results were given to pt and then pt's wife. Pt's wife took over the phone call and then asked if the medication would have any side affects with any other of Mike Day medications. I told her that I was sure [name] would not suggest a medication that would not work well with any of his others. She stated she would look up the medication and it's side affects give [place] a callback before any decision is made. Pt's wife then hung up the phone before I could schedule pt.

## 2023-11-13 NOTE — Telephone Encounter (Signed)
 Please call patient Liver and kidney function is normal. Blood cell counts are stable.   A1c/diabetes marker is elevated to 7.1.  We have discussed in the past and A1c continues to rise and we should start her diabetic medication to help protect his kidneys, heart and eyes.  I do recommend we start a medication called Marcelline Deist, that will help him bring down the sugar and has added protection for his kidneys.  If he is agreeable to start medication I will call this in for him today.  It is a once daily tab.  It will not drop blood sugars, therefore he does not have to be concerned about low sugars with the use of this medication. Other option is we could refer him to an endocrinologist to discuss options.  Please advise of his decision, follow-up in 3 and half-4 months on diabetes.

## 2023-11-24 ENCOUNTER — Other Ambulatory Visit: Payer: Self-pay | Admitting: Cardiovascular Disease

## 2023-11-29 ENCOUNTER — Ambulatory Visit: Payer: Medicare Other | Attending: Physician Assistant | Admitting: Physician Assistant

## 2023-11-29 ENCOUNTER — Encounter: Payer: Self-pay | Admitting: Physician Assistant

## 2023-11-29 VITALS — BP 116/60 | HR 69 | Ht 73.0 in | Wt 196.2 lb

## 2023-11-29 DIAGNOSIS — I4510 Unspecified right bundle-branch block: Secondary | ICD-10-CM | POA: Diagnosis not present

## 2023-11-29 DIAGNOSIS — I4821 Permanent atrial fibrillation: Secondary | ICD-10-CM | POA: Diagnosis not present

## 2023-11-29 DIAGNOSIS — I10 Essential (primary) hypertension: Secondary | ICD-10-CM | POA: Diagnosis not present

## 2023-11-29 DIAGNOSIS — E7800 Pure hypercholesterolemia, unspecified: Secondary | ICD-10-CM | POA: Diagnosis not present

## 2023-11-29 NOTE — Assessment & Plan Note (Signed)
 Rate is controlled. Tolerating anticoagulation with Apixaban. Recent hemoglobin and creatinine levels are normal. - Continue Apixaban 5 mg twice daily.

## 2023-11-29 NOTE — Assessment & Plan Note (Signed)
 LDL levels are optimal with current treatment. - Continue Simvastatin 20 mg daily

## 2023-11-29 NOTE — Assessment & Plan Note (Signed)
 Blood pressure is generally well-controlled with occasional fluctuations. Advised to monitor blood pressure at home and report any upward trends for potential medication adjustment. - Continue Lisinopril 20 mg daily. - Continue Lasix 20 mg daily.

## 2023-11-29 NOTE — Progress Notes (Signed)
 Cardiology Office Note:    Date:  11/29/2023  ID:  Mike Day, DOB 05-Nov-1945, MRN [id number] PCP: Natalia Leatherwood, DO   HeartCare Providers Cardiologist:  Tonny Bollman, MD Cardiology APP:  Beatrice Lecher, PA-C       Patient Profile:      AFlutter s/p RFCA Permanent AFib CHADS2-VASc=3 (DM, HTN, age x1). Apixaban Rx PVCs Event monitor 03/2019: AFib, Avg HR 69, PVCs, wide complex rhythm (NSVT vs aberrancy) Monitor 11/2022: 100% AFib, PVCs 4.6% LVH (Left Ventricular Hypertrophy) TTE 08/2013: severe basal septal LVH TTE 03/2019: severe asymmetric LVH, EF 60-65, RVSP 24.6 TTE 10/28/22: EF 60, no RWMA, severe asymmetric LVH, NL RVSF, severe RVE, mild LAE, severe RAE, mild MR, AV sclerosis, asc aorta 41 mm, RAP 8 CMR 01/25/23: EF 57, mod ASH, RVEF 44, no significant LGE (LVH likely due to HTN) Carotid Dz [place] 03/2019: Bilat ICA 1-39 [place] 8/21: bilat ICA 1-39 Diabetes mellitus Hypertension  Hyperlipidemia  Prostate CA s/p prostatectomy Aortic atherosclerosis (CT 12/2019) Venous insufficiency            Discussed the use of AI scribe software for clinical note transcription with the patient, who gave verbal consent to proceed.  History of Present Illness Mike Day is a 79 y.o. male who returns for follow up of AFib.  He was last seen in September 2024.  He is here with his wife. No chest symptoms or shortness of breath.  He has no significant bleeding issues. He wears compression socks regularly and has not experienced leg swelling or sores recently. His blood pressure fluctuates but is generally controlled, with a high reading one morning that normalized by mid-morning. His A1c was 7.1 with a glucose level of 150 mg/dL. He is considering lifestyle modifications to manage his glucose levels.  His PCP has recommended [place].  We briefly discussed the benefits of SGLT2 inhibitors. He has undergone multiple back procedures, including injections and radiofrequency ablation,  with the most recent procedure approximately two months ago. He reports significant improvement in his ability to walk and be active, with reduced pain levels. He remains active, volunteering at the [place] and planning trips, including a return to [place] and a visit to [facility] He is more active in yard work and walking, facilitated by his improved back condition.   Review of Systems  Musculoskeletal:  Positive for back pain.  Gastrointestinal:  Negative for hematochezia and melena.  Genitourinary:  Negative for hematuria.  -See HPI     Studies Reviewed:   EKG Interpretation Date/Time:  Wednesday November 29 2023 13:32:01 EDT Ventricular Rate:  69 PR Interval:    QRS Duration:  136 QT Interval:  452 QTC Calculation: 484 R Axis:   29  Text Interpretation: Atrial fibrillation with premature ventricular or aberrantly conducted complexes Right bundle branch block Confirmed by Tereso Newcomer [phone]) on 11/29/2023 1:38:01 PM   Results Labs reviewed 11/10/2023: K 4.4, creatinine 0.79, ALT 11 Hgb 14.2 A1c: 7.1 (11/2023)   Risk Assessment/Calculations:    CHA2DS2-VASc Score = 5   This indicates a 7.2% annual risk of stroke. The patient's score is based upon: CHF History: 0 HTN History: 1 Diabetes History: 1 Stroke History: 0 Vascular Disease History: 1 Age Score: 2 Gender Score: 0            Physical Exam:   VS:  BP 116/60   Pulse 69   Ht 6\' 1"  (1.854 m)   Wt 196 lb 3.2 oz (  89 kg)   SpO2 95%   BMI 25.89 kg/m    Wt Readings from Last 3 Encounters:  11/29/23 196 lb 3.2 oz (89 kg)  11/10/23 197 lb 12.8 oz (89.7 kg)  06/02/23 194 lb 3.2 oz (88.1 kg)    Constitutional:      Appearance: Healthy appearance. Not in distress.  Neck:     Vascular: JVD normal.  Pulmonary:     Breath sounds: Normal breath sounds. No wheezing. No rales.  Cardiovascular:     Normal rate. Irregularly irregular rhythm.     Murmurs: There is no murmur.  Edema:    Peripheral edema absent.   Abdominal:     Palpations: Abdomen is soft.        Assessment and Plan:   Assessment & Plan Permanent atrial fibrillation (HCC) Rate is controlled. Tolerating anticoagulation with Apixaban. Recent hemoglobin and creatinine levels are normal. - Continue Apixaban 5 mg twice daily. Essential hypertension Blood pressure is generally well-controlled with occasional fluctuations. Advised to monitor blood pressure at home and report any upward trends for potential medication adjustment. - Continue Lisinopril 20 mg daily. - Continue Lasix 20 mg daily. Pure hypercholesterolemia LDL levels are optimal with current treatment. - Continue Simvastatin 20 mg daily RBBB (right bundle branch block) EKG shows progression from incomplete to complete right bundle branch block. Previous cardiac MRI and echocardiogram showed normal heart function with mild mitral regurgitation. No further workup needed at this time.      Dispo:  Return in about 6 months (around 05/31/2024) for Routine Follow Up, w/ Tereso Newcomer, PA-C.  Signed, Tereso Newcomer, PA-C

## 2023-11-29 NOTE — Patient Instructions (Signed)
 Medication Instructions:  Your physician recommends that you continue on your current medications as directed. Please refer to the Current Medication list given to you today.  *If you need a refill on your cardiac medications before your next appointment, please call your pharmacy*   Lab Work: None ordered  If you have labs (blood work) drawn today and your tests are completely normal, you will receive your results only by: MyChart Message (if you have MyChart) OR A paper copy in the mail If you have any lab test that is abnormal or we need to change your treatment, we will call you to review the results.   Testing/Procedures: None ordered   Follow-Up: At [facility], you and your health needs are our priority.  As part of our continuing mission to provide you with exceptional heart care, we have created designated Provider Care Teams.  These Care Teams include your primary Cardiologist (physician) and Advanced Practice Providers (APPs -  Physician Assistants and Nurse Practitioners) who all work together to provide you with the care you need, when you need it.  We recommend signing up for the patient portal called "MyChart".  Sign up information is provided on this After Visit Summary.  MyChart is used to connect with patients for Virtual Visits (Telemedicine).  Patients are able to view lab/test results, encounter notes, upcoming appointments, etc.  Non-urgent messages can be sent to your provider as well.   To learn more about what you can do with MyChart, go to [email or web address].    Your next appointment:   6 month(s)  Provider:   Tereso Newcomer, PA-C         Other Instructions     1st Floor: - Lobby - Registration  - Pharmacy  - Lab - Cafe  2nd Floor: - PV Lab - Diagnostic Testing (echo, CT, nuclear med)  3rd Floor: - Vacant  4th Floor: - TCTS (cardiothoracic surgery) - AFib Clinic - Structural Heart Clinic - Vascular Surgery  - Vascular  Ultrasound  5th Floor: - HeartCare Cardiology (general and EP) - Clinical Pharmacy for coumadin, hypertension, lipid, weight-loss medications, and med management appointments    Valet parking services will be available as well.

## 2023-12-26 DIAGNOSIS — L570 Actinic keratosis: Secondary | ICD-10-CM | POA: Diagnosis not present

## 2023-12-26 DIAGNOSIS — L814 Other melanin hyperpigmentation: Secondary | ICD-10-CM | POA: Diagnosis not present

## 2023-12-26 DIAGNOSIS — Z85828 Personal history of other malignant neoplasm of skin: Secondary | ICD-10-CM | POA: Diagnosis not present

## 2023-12-26 DIAGNOSIS — L821 Other seborrheic keratosis: Secondary | ICD-10-CM | POA: Diagnosis not present

## 2024-01-01 ENCOUNTER — Other Ambulatory Visit: Payer: Self-pay | Admitting: Cardiovascular Disease

## 2024-01-01 DIAGNOSIS — Z79899 Other long term (current) drug therapy: Secondary | ICD-10-CM

## 2024-01-01 DIAGNOSIS — I517 Cardiomegaly: Secondary | ICD-10-CM

## 2024-02-14 DIAGNOSIS — M25522 Pain in left elbow: Secondary | ICD-10-CM | POA: Diagnosis not present

## 2024-03-13 ENCOUNTER — Other Ambulatory Visit: Payer: Self-pay | Admitting: Cardiovascular Disease

## 2024-03-13 NOTE — Telephone Encounter (Signed)
 Pt last saw [name], Mike Day on 11/29/23, last labs 11/10/23 Creat 0.79, age 78, weight 89kg, based on specified criteria pt is on appropriate dosage of Eliquis  5mg  BID for afib.  Will refill rx.

## 2024-03-28 ENCOUNTER — Other Ambulatory Visit: Payer: Self-pay | Admitting: Family Medicine

## 2024-04-26 ENCOUNTER — Ambulatory Visit (INDEPENDENT_AMBULATORY_CARE_PROVIDER_SITE_OTHER): Admitting: Family Medicine

## 2024-04-26 VITALS — BP 122/78 | HR 66 | Temp 98.0°F | Wt 195.0 lb

## 2024-04-26 DIAGNOSIS — I4821 Permanent atrial fibrillation: Secondary | ICD-10-CM | POA: Diagnosis not present

## 2024-04-26 DIAGNOSIS — E119 Type 2 diabetes mellitus without complications: Secondary | ICD-10-CM | POA: Diagnosis not present

## 2024-04-26 DIAGNOSIS — D6869 Other thrombophilia: Secondary | ICD-10-CM | POA: Diagnosis not present

## 2024-04-26 DIAGNOSIS — E7800 Pure hypercholesterolemia, unspecified: Secondary | ICD-10-CM

## 2024-04-26 DIAGNOSIS — E559 Vitamin D deficiency, unspecified: Secondary | ICD-10-CM

## 2024-04-26 DIAGNOSIS — I10 Essential (primary) hypertension: Secondary | ICD-10-CM | POA: Diagnosis not present

## 2024-04-26 DIAGNOSIS — I700 Atherosclerosis of aorta: Secondary | ICD-10-CM

## 2024-04-26 DIAGNOSIS — M8000XD Age-related osteoporosis with current pathological fracture, unspecified site, subsequent encounter for fracture with routine healing: Secondary | ICD-10-CM

## 2024-04-26 LAB — MICROALBUMIN / CREATININE URINE RATIO
Creatinine,U: 80.5 mg/dL
Microalb Creat Ratio: 9 mg/g (ref 0.0–30.0)
Microalb, Ur: 0.7 mg/dL (ref 0.0–1.9)

## 2024-04-26 LAB — POCT GLYCOSYLATED HEMOGLOBIN (HGB A1C)
HbA1c POC (<> result, manual entry): 6.7 % (ref 4.0–5.6)
HbA1c, POC (controlled diabetic range): 6.7 % (ref 0.0–7.0)
HbA1c, POC (prediabetic range): 6.7 % — AB (ref 5.7–6.4)
Hemoglobin A1C: 6.7 % — AB (ref 4.0–5.6)

## 2024-04-26 MED ORDER — ALENDRONATE SODIUM 70 MG PO TABS: 70.0000 mg | ORAL_TABLET | ORAL | 3 refills | Status: AC

## 2024-04-26 NOTE — Progress Notes (Signed)
 Mike Day , [birth year], 78 y.o., male MRN: [id number] Patient Care Team    Relationship Specialty Notifications Start End  Catherine Charlies LABOR, DO PCP - General Family Medicine  06/11/18   Wonda Sharper, MD PCP - Cardiology Cardiology  08/21/20   Fidel Rogue, MD Consulting Physician Orthopedic Surgery  10/03/15   Lelon Glendia ONEIDA DEVONNA Physician Assistant Cardiology  04/29/16   Cary Doffing, MD Consulting Physician Dermatology  08/24/17   Nicholaus Tanda LITTIE DOUGLAS, MD Attending Physician Urology  06/11/18   Octavia Bruckner, MD Consulting Physician Ophthalmology  06/11/18   Chick Venetia BRAVO, MD Consulting Physician Sports Medicine  01/21/20   Beverley Evalene BIRCH, MD Consulting Physician Orthopedic Surgery  07/11/22     Chief Complaint  Patient presents with   Hypertension   Diabetes    Pt is fasting.      Subjective: Mike Day is a 78 y.o. male present for routine chronic condition follow-up Essential hypertension/hyperlipidemia/permanent atrial fibrillation/aortic atherosclerosis Pt reports compliance with lisinopril , , Zocor  described by cardiology.  Blood pressures ranges at home normal. Patient denies chest pain, shortness of breath, dizziness or lower extremity edema.   Diabetes mellitus without complication (HCC)> prediabetes: Patient has been able to control his A1c with routine exercise and diet alone.  He is now in the prediabetic range and has remained stable. Patient denies dizziness, hyperglycemic or hypoglycemic events. Patient denies numbness, tingling in the extremities or nonhealing wounds of feet.   B12/vit d def Supplementing with vitamin D  and B12.  Osteoporosis: started fosamax -tolerating. DEXA completed 09/12/2023. -2.5  Back pain/compression fracture: He would like refills on zanaflex  2 mg- uses occasionally. Performing PT.  Patient has now established with sports med and had his first OMT.  He had planned to schedule routinely.     04/26/2024   10:53  AM 05/26/2023    9:13 AM 03/14/2023   11:41 AM 10/21/2022   12:53 PM 01/29/2022    9:33 AM  Depression screen PHQ 2/9  Decreased Interest 0 0 3 0 0  Down, Depressed, Hopeless 0 1 3 0 0  PHQ - 2 Score 0 1 6 0 0  Altered sleeping 0 0 3    Tired, decreased energy 1 1 2     Change in appetite 1 0 1    Feeling bad or failure about yourself  0 1 3    Trouble concentrating 0 0 2    Moving slowly or fidgety/restless 0 0 3    Suicidal thoughts 0 0 3    PHQ-9 Score 2 3 23     Difficult doing work/chores Not difficult at all Not difficult at all Very difficult      Allergies  Allergen Reactions   Levofloxacin Rash   Neosporin [Neomycin-Bacitracin Zn-Polymyx] Other (See Comments)    Dry/scaly skin/blistering    Tape Other (See Comments)    ADHESIVE TAPE; BLISTERS,   Social History   Social History Narrative   Married, 1 son, 2 grand-daughters.   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   Alcohol: 1 glass red wine per night.   Was once a long distance runner.   Past Medical History:  Diagnosis Date   Anemia    Early 20's   Atrial fibrillation (HCC) 10/07/2014   3-14 day cardiac monitor 03/2019:  AFib, Avg HR 69, no significant pauses, PVCs, wide complex runs (aberrancy vs NSVT - longest 32 beats).  Atrial flutter (HCC) 2008   Status post RFCA Dr Danelle Birmingham 2008   Carotid artery plaque    Carotid US  8/21: Bilat ICA 1-39   Chronic atrial fibrillation (HCC)    Eliquis    Closed fracture of left olecranon process 07/13/2022   Closed nondisplaced fracture of left patella 01/20/2020   Closed nondisplaced intertrochanteric fracture of left femur (HCC) 07/13/2022   Depression, major, single episode, mild (HCC) 03/15/2023   Dermatochalasis of both upper eyelids 07/01/2014   Dysrhythmia    a fib    Hip fracture (HCC) 07/10/2022   Hx of adenomatous colonic polyps    Hyperlipidemia    Hypertension    LVH (left ventricular hypertrophy)  11/02/2022   LVH  TTE 08/2013: severe basal septal LVH  TTE 03/2019: severe asymmetric LVH, EF 60-65, RVSP 24.6  TTE 10/28/22: EF 60, no RWMA, severe asymmetric LVH, NL RVSF, severe RVE, mild LAE, severe RAE, mild MR, AV sclerosis, asc aorta 41 mm, RAP 8  CMR 01/25/23: EF 57, mod ASH, RVEF 44, no significant LGE   Malignant neoplasm of prostate (HCC) 05/18/2012   Neuromuscular disorder (HCC)    neuropathy feet   Night sweats 12/10/2021   Patellar sleeve fracture of left knee 2021   Prostate cancer (HCC) 2006   Prostatectomy   Shingles 01/2019   left side of face/eye/neck/ear    SKIN CANCER, HX OF 03/11/2009   Moh's L hand for Squamous Cell;2 other squamous cells removed w/o Mohs 2013 Leita Forbes MD  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   Ulcer of lower extremity (HCC) 10/21/2022   Past Surgical History:  Procedure Laterality Date   BLADDER SUSPENSION  2010   COLONOSCOPY W/ POLYPECTOMY  2003;2010;04/2014   Tubular adenoma: recall 5 yrs (after 04/2019)   EYE SURGERY Bilateral 2021,2022   FEMUR IM NAIL Left 07/12/2022   Procedure: INTRAMEDULLARY (IM) NAIL FEMORAL;  Surgeon: Beverley Evalene BIRCH, MD;  Location: [facility];  Service: Orthopedics;  Laterality: Left;   HERNIA REPAIR  07/2008   Dr.Martin   INGUINAL HERNIA REPAIR Left 08/25/2020   Procedure: OPEN LEFT INGUINAL HERNIA REPAIR WITH MESH;  Surgeon: Gladis Cough, MD;  Location: [facility];  Service: General;  Laterality: Left;   MOHS SURGERY     left hand   MOHS SURGERY Left 12/2020   ear   ORIF ELBOW FRACTURE Left 07/14/2022   Procedure: OPEN REDUCTION INTERNAL FIXATION (ORIF) OF LEFT ELBOW;  Surgeon: Beverley Evalene BIRCH, MD;  Location: [facility];  Service: Orthopedics;  Laterality: Left;   PROSTATECTOMY  2006   Robotic for adenocarcinoma; Dr Renay Moats   RADIOFREQUENCY ABLATION  01/15/2007   for ectopic atrial foci   SEPTOPLASTY  Age 57   TRANSTHORACIC ECHOCARDIOGRAM  09/02/2013   Normal.  EF 60%.  LAE and RAE.   XI ROBOTIC  ASSISTED INGUINAL HERNIA REPAIR WITH MESH Right 01/11/2021   Procedure: XI ROBOTIC ASSISTED  RIGHT INGUINAL HERNIA REPAIR WITH MESH;  Surgeon: Gladis Cough, MD;  Location: [facility];  Service: General;  Laterality: Right;   Family History  Problem Relation Age of Onset   Lung disease Father        Black Lung   Heart disease Father        Congenital   Emphysema Father    COPD Father    Diabetes Mother    Stroke Mother 48   Hypertension Brother    Heart disease Brother    Hyperlipidemia Brother  Heart attack Brother 57       sudden death   Cancer Neg Hx    Colon cancer Neg Hx    Esophageal cancer Neg Hx    Rectal cancer Neg Hx    Stomach cancer Neg Hx    Allergies as of 04/26/2024       Reactions   Levofloxacin Rash   Neosporin [neomycin-bacitracin Zn-polymyx] Other (See Comments)   Dry/scaly skin/blistering    Tape Other (See Comments)   ADHESIVE TAPE; BLISTERS,        Medication List        Accurate as of April 26, 2024 11:09 AM. If you have any questions, ask your nurse or doctor.          acetaminophen  500 MG tablet Commonly known as: TYLENOL  Take 1,000 mg by mouth every 8 (eight) hours as needed for moderate pain.   alendronate  70 MG tablet Commonly known as: FOSAMAX  Take 1 tablet (70 mg total) by mouth once a week. Take with a full glass of water  on an empty stomach. What changed: See the new instructions. Changed by: Lailany Enoch   cetaphil cream Apply 1 application topically daily.   cholecalciferol  25 MCG (1000 UNIT) tablet Commonly known as: VITAMIN D3 Take 2 tablets (2,000 Units total) by mouth daily.   Eliquis  5 MG Tabs tablet Generic drug: apixaban  TAKE 1 TABLET TWICE A DAY   furosemide  20 MG tablet Commonly known as: LASIX  TAKE 1 TABLET DAILY   lisinopril  20 MG tablet Commonly known as: ZESTRIL  TAKE 1 TABLET DAILY   simvastatin  20 MG tablet Commonly known as: ZOCOR  TAKE 1 TABLET AT BEDTIME   triamcinolone  55 MCG/ACT Aero  nasal inhaler Commonly known as: NASACORT  Place 1 spray into the nose daily as needed (allergies).        All past medical history, surgical history, allergies, family history, immunizations andmedications were updated in the EMR today and reviewed under the history and medication portions of their EMR.     ROS: Negative, with the exception of above mentioned in HPI   Objective:  BP 122/78   Pulse 66   Temp 98 F (36.7 C)   Wt 195 lb (88.5 kg)   SpO2 97%   BMI 25.73 kg/m  Body mass index is 25.73 kg/m. Physical Exam Vitals and nursing note reviewed.  Constitutional:      General: He is not in acute distress.    Appearance: Normal appearance. He is not ill-appearing, toxic-appearing or diaphoretic.  HENT:     Head: Normocephalic and atraumatic.  Eyes:     General: No scleral icterus.       Right eye: No discharge.        Left eye: No discharge.     Extraocular Movements: Extraocular movements intact.     Pupils: Pupils are equal, round, and reactive to light.  Cardiovascular:     Rate and Rhythm: Normal rate and regular rhythm.  Pulmonary:     Effort: Pulmonary effort is normal. No respiratory distress.     Breath sounds: Normal breath sounds. No wheezing, rhonchi or rales.  Musculoskeletal:     Cervical back: Neck supple.     Right lower leg: No edema.     Left lower leg: No edema.  Lymphadenopathy:     Cervical: No cervical adenopathy.  Skin:    General: Skin is warm and dry.     Coloration: Skin is not jaundiced or pale.     Findings:  No rash.  Neurological:     Mental Status: He is alert and oriented to person, place, and time. Mental status is at baseline.  Psychiatric:        Mood and Affect: Mood normal.        Behavior: Behavior normal.        Thought Content: Thought content normal.        Judgment: Judgment normal.    Diabetic Foot Exam - Simple   Simple Foot Form Diabetic Foot exam was performed with the following findings: Yes 04/26/2024 11:08  AM  Visual Inspection No deformities, no ulcerations, no other skin breakdown bilaterally: Yes Sensation Testing Intact to touch and monofilament testing bilaterally: Yes Pulse Check Posterior Tibialis and Dorsalis pulse intact bilaterally: Yes Comments     No results found. No results found. Results for orders placed or performed in visit on 04/26/24 (from the past 24 hours)  POCT HgB A1C     Status: Abnormal   Collection Time: 04/26/24 10:58 AM  Result Value Ref Range   Hemoglobin A1C 6.7 (A) 4.0 - 5.6 %   HbA1c POC (<> result, manual entry) 6.7 4.0 - 5.6 %   HbA1c, POC (prediabetic range) 6.7 (A) 5.7 - 6.4 %   HbA1c, POC (controlled diabetic range) 6.7 0.0 - 7.0 %     Assessment/Plan: KEGAN MCKEITHAN is a 78 y.o. male present for OV for Chronic Conditions/illness Management DM2- diet controlled Urine microalbumin > pt will drop off. Specimen kit provided. A1c collected today> 6.7 Eye records requested Foot exam completed 04/26/2024  Essential hypertension/Chronic anticoagulation/A. Fib/Aortic atherosclerosis (HCC) Stable Established with cardiology.  Continue routine follow-ups.  Back pain/compression fractures: Continue PT prn Dry needling has helped.  Est with sports med. CT abd 2024 confirmed lumbar compression fx and progression of DDD Musculoskeletal: Severe L1 compression fracture with vertebra plana and moderate L5 compression fracture were present on the radiographs last year, new since 2021. Background generalized osteopenia. Progressed sclerotic degeneration of the L3-L4 spinous processes. Referral placed to PMR/ramos  Osteoporosis /Vitamin D  deficiency Patient is supplementing vitamin D  DEXA completed 09/12/2023. -2.5>tolerating Fosamax  weekly>refilled mail-in pharmacy  B12 deficiency well supplemented with the B12 is normal   Reviewed expectations re: course of current medical issues. Discussed self-management of symptoms. Outlined signs and  symptoms indicating need for more acute intervention. Patient verbalized understanding and all questions were answered. Patient received an After-Visit Summary.    Orders Placed This Encounter  Procedures   Urine Microalbumin w/creat. ratio   POCT HgB A1C   Meds ordered this encounter  Medications   alendronate  (FOSAMAX ) 70 MG tablet    Sig: Take 1 tablet (70 mg total) by mouth once a week. Take with a full glass of water  on an empty stomach.    Dispense:  12 tablet    Refill:  3    Referral Orders  No referral(s) requested today    Note is dictated utilizing voice recognition software. Although note has been proof read prior to signing, occasional typographical errors still can be missed. If any questions arise, please do not hesitate to call for verification.   electronically signed by:  Charlies Bellini, DO  [facility] - [place]

## 2024-04-26 NOTE — Patient Instructions (Addendum)
 Return in about 24 weeks (around 10/11/2024) for Routine chronic condition follow-up.        Great to see you today.  I have refilled the medication(s) we provide.   If labs were collected or images ordered, we will inform you of  results once we have received them and reviewed. We will contact you either by echart message, or telephone call.  Please give ample time to the testing facility, and our office to run,  receive and review results. Please do not call inquiring of results, even if you can see them in your chart. We will contact you as soon as we are able. If it has been over 1 week since the test was completed, and you have not yet heard from us , then please call us .    - echart message- for normal results that have been seen by the patient already.   - telephone call: abnormal results or if patient has not viewed results in their echart.  If a referral to a specialist was entered for you, please call us  in 2 weeks if you have not heard from the specialist office to schedule.

## 2024-04-29 ENCOUNTER — Ambulatory Visit: Payer: Self-pay | Admitting: Family Medicine

## 2024-04-30 DIAGNOSIS — L814 Other melanin hyperpigmentation: Secondary | ICD-10-CM | POA: Diagnosis not present

## 2024-04-30 DIAGNOSIS — D692 Other nonthrombocytopenic purpura: Secondary | ICD-10-CM | POA: Diagnosis not present

## 2024-04-30 DIAGNOSIS — L820 Inflamed seborrheic keratosis: Secondary | ICD-10-CM | POA: Diagnosis not present

## 2024-04-30 DIAGNOSIS — L821 Other seborrheic keratosis: Secondary | ICD-10-CM | POA: Diagnosis not present

## 2024-04-30 DIAGNOSIS — C44212 Basal cell carcinoma of skin of right ear and external auricular canal: Secondary | ICD-10-CM | POA: Diagnosis not present

## 2024-04-30 DIAGNOSIS — D485 Neoplasm of uncertain behavior of skin: Secondary | ICD-10-CM | POA: Diagnosis not present

## 2024-04-30 DIAGNOSIS — Z85828 Personal history of other malignant neoplasm of skin: Secondary | ICD-10-CM | POA: Diagnosis not present

## 2024-04-30 DIAGNOSIS — L570 Actinic keratosis: Secondary | ICD-10-CM | POA: Diagnosis not present

## 2024-04-30 DIAGNOSIS — C44311 Basal cell carcinoma of skin of nose: Secondary | ICD-10-CM | POA: Diagnosis not present

## 2024-04-30 DIAGNOSIS — C44719 Basal cell carcinoma of skin of left lower limb, including hip: Secondary | ICD-10-CM | POA: Diagnosis not present

## 2024-04-30 DIAGNOSIS — D1801 Hemangioma of skin and subcutaneous tissue: Secondary | ICD-10-CM | POA: Diagnosis not present

## 2024-05-08 ENCOUNTER — Other Ambulatory Visit: Payer: Self-pay | Admitting: Cardiovascular Disease

## 2024-05-14 ENCOUNTER — Ambulatory Visit (INDEPENDENT_AMBULATORY_CARE_PROVIDER_SITE_OTHER)

## 2024-05-14 DIAGNOSIS — Z23 Encounter for immunization: Secondary | ICD-10-CM

## 2024-05-14 NOTE — Progress Notes (Signed)
 Pt in for high dose flu vaccine   Injection tolerated well  Vaccine handout provided for pt

## 2024-05-28 ENCOUNTER — Encounter: Payer: Self-pay | Admitting: *Deleted

## 2024-05-29 ENCOUNTER — Ambulatory Visit: Attending: Cardiology | Admitting: Physician Assistant

## 2024-05-29 ENCOUNTER — Encounter: Payer: Self-pay | Admitting: Physician Assistant

## 2024-05-29 VITALS — BP 140/78 | HR 84 | Ht 74.0 in | Wt 196.4 lb

## 2024-05-29 DIAGNOSIS — I10 Essential (primary) hypertension: Secondary | ICD-10-CM | POA: Diagnosis present

## 2024-05-29 DIAGNOSIS — E7800 Pure hypercholesterolemia, unspecified: Secondary | ICD-10-CM | POA: Insufficient documentation

## 2024-05-29 DIAGNOSIS — I700 Atherosclerosis of aorta: Secondary | ICD-10-CM | POA: Diagnosis not present

## 2024-05-29 DIAGNOSIS — I4821 Permanent atrial fibrillation: Secondary | ICD-10-CM | POA: Insufficient documentation

## 2024-05-29 NOTE — Patient Instructions (Signed)
 Medication Instructions:  Hold Eliquis  for 2 days, then resume   Lab Work: Lipid panel today    Follow-Up: At [facility], you and your health needs are our priority.  As part of our continuing mission to provide you with exceptional heart care, our providers are all part of one team.  This team includes your primary Cardiologist (physician) and Advanced Practice Providers or APPs (Physician Assistants and Nurse Practitioners) who all work together to provide you with the care you need, when you need it.  Your next appointment:   6 month(s)  Provider:   [name], PA-C   Other Instructions:

## 2024-05-29 NOTE — Assessment & Plan Note (Signed)
 Blood pressure elevated today.  He is in pain related to his hip.  Blood pressures at home are usually well-controlled. -Continue to monitor blood pressure -Continue lisinopril  20 mg daily, furosemide  20 mg daily

## 2024-05-29 NOTE — Progress Notes (Signed)
 OFFICE NOTE:    Date:  05/29/2024  ID:  Mike Day, DOB September 30, 1945, MRN [id number] PCP: Catherine Charlies LABOR, DO  [facility] Providers Cardiologist:  Ozell Fell, MD Cardiology APP:  Lelon Glendia DASEN, PA-C        AFlutter s/p RFCA Permanent AFib CHADS2-VASc=3 (DM, HTN, age x1). Apixaban  Rx PVCs Event monitor 03/2019: AFib, Avg HR 69, PVCs, wide complex rhythm (NSVT vs aberrancy) Monitor 11/2022: 100% AFib, PVCs 4.6% LVH (Left Ventricular Hypertrophy) TTE 08/2013: severe basal septal LVH TTE 03/2019: severe asymmetric LVH, EF 60-65, RVSP 24.6 TTE 10/28/22: EF 60, no RWMA, severe asymmetric LVH, NL RVSF, severe RVE, mild LAE, severe RAE, mild MR, AV sclerosis, asc aorta 41 mm, RAP 8 CMR 01/25/23: EF 57, mod ASH, RVEF 44, no significant LGE (LVH likely due to HTN) Carotid Dz US  03/2019: Bilat ICA 1-39 US  8/21: bilat ICA 1-39 Diabetes mellitus Hypertension  Hyperlipidemia  Prostate CA s/p prostatectomy Aortic atherosclerosis (CT 12/2019) Venous insufficiency           Discussed the use of AI scribe software for clinical note transcription with the patient, who gave verbal consent to proceed. History of Present Illness Mike Day is a 78 y.o. male who returns for follow up of AFib. He was last seen in 11/2023.   He developed a new hematoma on his left hip today. He bumped his hip last Friday night but did not experience any immediate bruising or swelling. This morning, after going for a walk, he noticed swelling and aching in the area. The hematoma is large and noticeable through his pants.  He has not experienced any bleeding, black stools, or blood in urine. He has not had a stroke in the past. He has a history of a left hip fracture, which required pinning. He is planning to go on a another [employer] cruise soon.  He has not had chest discomfort or shortness of breath.    ROS-See HPI    Studies Reviewed:      05/26/23: TC 163, HDL 59.4, LDL 88, Trig 75 02/04/73: K 4.4,  SCr 0.79, ALT 11, Hgb 14.2  Risk Assessment/Calculations: CHA2DS2-VASc Score = 5   This indicates a 7.2% annual risk of stroke. The patient's score is based upon: CHF History: 0 HTN History: 1 Diabetes History: 1 Stroke History: 0 Vascular Disease History: 1 Age Score: 2 Gender Score: 0    HYPERTENSION CONTROL Vitals:   05/29/24 1336 05/29/24 1412  BP: (!) 160/100 (!) 140/78    The patient's blood pressure is elevated above target today.  In order to address the patient's elevated BP: Blood pressure will be monitored at home to determine if medication changes need to be made.         Physical Exam:  VS:  BP (!) 140/78   Pulse 84   Ht 6' 2 (1.88 m)   Wt 196 lb 6.4 oz (89.1 kg)   SpO2 96%   BMI 25.22 kg/m        Wt Readings from Last 3 Encounters:  05/29/24 196 lb 6.4 oz (89.1 kg)  04/26/24 195 lb (88.5 kg)  11/29/23 196 lb 3.2 oz (89 kg)    Cardiovascular:     Irregularly irregular rhythm.     Murmurs: There is no murmur.  Musculoskeletal:     Comments: Large hard mass - ?hematoma vs cyst L gluteal area; no bruising      Assessment and Plan:    Assessment &  Plan Permanent atrial fibrillation (HCC) Rate controlled.  He is tolerating anticoagulation well.  Creatinine, hemoglobin in March were normal.  He did injure his left hip today.  He has a questionable hematoma over his gluteal area.  However, there is no bruising.  He did have a hip fracture with pinning in the past.  He sees orthopedics tomorrow. -Hold Eliquis  x 2 days, then resume Eliquis  5 mg twice daily -Follow-up 6 months. Essential hypertension Blood pressure elevated today.  He is in pain related to his hip.  Blood pressures at home are usually well-controlled. -Continue to monitor blood pressure -Continue lisinopril  20 mg daily, furosemide  20 mg daily Pure hypercholesterolemia LDL goal should really be less than 70 given aortic atherosclerosis and mild carotid stenosis. -Continue simvastatin  20  mg daily -Obtain fasting lipids -If LDL greater than 70, increase simvastatin  to 40 mg Aortic atherosclerosis He does not require aspirin  as he is on Eliquis .  Continue statin therapy      Dispo:  Return in about 6 months (around 11/26/2024) for Routine Follow Up, w/ [name], PA-C.  Signed, [name], PA-C

## 2024-05-29 NOTE — Assessment & Plan Note (Signed)
 LDL goal should really be less than 70 given aortic atherosclerosis and mild carotid stenosis. -Continue simvastatin  20 mg daily -Obtain fasting lipids -If LDL greater than 70, increase simvastatin  to 40 mg

## 2024-05-29 NOTE — Assessment & Plan Note (Signed)
 He does not require aspirin  as he is on Eliquis .  Continue statin therapy

## 2024-05-29 NOTE — Assessment & Plan Note (Signed)
 Rate controlled.  He is tolerating anticoagulation well.  Creatinine, hemoglobin in March were normal.  He did injure his left hip today.  He has a questionable hematoma over his gluteal area.  However, there is no bruising.  He did have a hip fracture with pinning in the past.  He sees orthopedics tomorrow. -Hold Eliquis  x 2 days, then resume Eliquis  5 mg twice daily -Follow-up 6 months.

## 2024-05-30 ENCOUNTER — Ambulatory Visit: Payer: Self-pay | Admitting: Physician Assistant

## 2024-05-30 DIAGNOSIS — S7002XA Contusion of left hip, initial encounter: Secondary | ICD-10-CM | POA: Diagnosis not present

## 2024-05-30 LAB — LIPID PANEL
Chol/HDL Ratio: 2.6 ratio (ref 0.0–5.0)
Cholesterol, Total: 122 mg/dL (ref 100–199)
HDL: 47 mg/dL (ref >39)
LDL Chol Calc (NIH): 60 mg/dL (ref 0–99)
Triglycerides: 74 mg/dL (ref 0–149)
VLDL Cholesterol Cal: 15 mg/dL (ref 5–40)

## 2024-06-24 DIAGNOSIS — S8012XD Contusion of left lower leg, subsequent encounter: Secondary | ICD-10-CM | POA: Diagnosis not present

## 2024-07-02 DIAGNOSIS — M47816 Spondylosis without myelopathy or radiculopathy, lumbar region: Secondary | ICD-10-CM | POA: Diagnosis not present

## 2024-07-02 DIAGNOSIS — Z85828 Personal history of other malignant neoplasm of skin: Secondary | ICD-10-CM | POA: Diagnosis not present

## 2024-07-02 DIAGNOSIS — C44311 Basal cell carcinoma of skin of nose: Secondary | ICD-10-CM | POA: Diagnosis not present

## 2024-07-02 DIAGNOSIS — C44719 Basal cell carcinoma of skin of left lower limb, including hip: Secondary | ICD-10-CM | POA: Diagnosis not present

## 2024-07-10 ENCOUNTER — Encounter: Payer: Self-pay | Admitting: Family Medicine

## 2024-07-10 ENCOUNTER — Ambulatory Visit (INDEPENDENT_AMBULATORY_CARE_PROVIDER_SITE_OTHER): Admitting: Family Medicine

## 2024-07-10 VITALS — BP 148/80 | HR 68 | Temp 97.9°F | Wt 195.0 lb

## 2024-07-10 DIAGNOSIS — I10 Essential (primary) hypertension: Secondary | ICD-10-CM | POA: Diagnosis not present

## 2024-07-10 MED ORDER — LISINOPRIL 30 MG PO TABS: 30.0000 mg | ORAL_TABLET | Freq: Every day | ORAL | 1 refills | Status: DC

## 2024-07-10 NOTE — Patient Instructions (Addendum)
 Return in about 11 weeks (around 09/25/2024), or if symptoms worsen or fail to improve, for Routine chronic condition follow-up.  Nurse visit 4 weeks for BP recheck.       Great to see you today.  I have refilled the medication(s) we provide.   If labs were collected or images ordered, we will inform you of  results once we have received them and reviewed. We will contact you either by echart message, or telephone call.  Please give ample time to the testing facility, and our office to run,  receive and review results. Please do not call inquiring of results, even if you can see them in your chart. We will contact you as soon as we are able. If it has been over 1 week since the test was completed, and you have not yet heard from us , then please call us .    - echart message- for normal results that have been seen by the patient already.   - telephone call: abnormal results or if patient has not viewed results in their echart.  If a referral to a specialist was entered for you, please call us  in 2 weeks if you have not heard from the specialist office to schedule.

## 2024-07-10 NOTE — Progress Notes (Signed)
 Mike Day , [birth year], 78 y.o., male MRN: [id number] Patient Care Team    Relationship Specialty Notifications Start End  Catherine Charlies LABOR, DO PCP - General Family Medicine  06/11/18   Wonda Sharper, MD PCP - Cardiology Cardiology  08/21/20   Fidel Rogue, MD Consulting Physician Orthopedic Surgery  10/03/15   Lelon Glendia ONEIDA DEVONNA Physician Assistant Cardiology  04/29/16   Cary Doffing, MD Consulting Physician Dermatology  08/24/17   Nicholaus Tanda LITTIE DOUGLAS, MD Attending Physician Urology  06/11/18   Octavia Bruckner, MD Consulting Physician Ophthalmology  06/11/18   Chick Venetia BRAVO, MD Consulting Physician Sports Medicine  01/21/20   Beverley Evalene BIRCH, MD Consulting Physician Orthopedic Surgery  07/11/22     Chief Complaint  Patient presents with   Hypertension    Elevated BP over the last 2 weeks; BP this morning 200/101. Pt brings at home monitor with him today.      Subjective: Mike Day is a 78 y.o. Pt presents for an OV with concerns of elevated blood pressure readings.  He recently underwent procedure which team told him his blood pressure was elevated.  He started monitoring his blood pressure over the last week and found to have many high blood pressures as high as 200/101.  He states he is completely asymptomatic with these blood pressures.  He does not feel any different and he denies any lower extremity edema.  He reports he is not in any pain from his Mohs procedure. He is compliant with lisinopril  20 mg daily, Lasix  20 mg daily, Eliquis  5 mg twice daily and Zocor  20 mg daily.     04/26/2024   10:53 AM 05/26/2023    9:13 AM 03/14/2023   11:41 AM 10/21/2022   12:53 PM 01/29/2022    9:33 AM  Depression screen PHQ 2/9  Decreased Interest 0 0 3 0 0  Down, Depressed, Hopeless 0 1 3 0 0  PHQ - 2 Score 0 1 6 0 0  Altered sleeping 0 0 3    Tired, decreased energy 1 1 2     Change in appetite 1 0 1    Feeling bad or failure about yourself  0 1 3    Trouble  concentrating 0 0 2    Moving slowly or fidgety/restless 0 0 3    Suicidal thoughts 0 0 3    PHQ-9 Score 2 3 23     Difficult doing work/chores Not difficult at all Not difficult at all Very difficult      Allergies  Allergen Reactions   Bacitra-Neomycin-Polymyxin-Hc Rash    bacitracin / neomycin / polymyxin B   Levofloxacin Rash   Neosporin [Neomycin-Bacitracin Zn-Polymyx] Other (See Comments)    Dry/scaly skin/blistering    Silicone Other (See Comments)    silicones   Tape Other (See Comments)    ADHESIVE TAPE; BLISTERS,   Social History   Social History Narrative   Married, 1 son, 2 grand-daughters.   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   Alcohol: 1 glass red wine per night.   Was once a long distance runner.   Past Medical History:  Diagnosis Date   Anemia    Early 20's   Atrial fibrillation (HCC) 10/07/2014   3-14 day cardiac monitor 03/2019:  AFib, Avg HR 69, no significant pauses, PVCs, wide complex runs (aberrancy vs NSVT - longest 32 beats).   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   Hip fracture (HCC) 07/10/2022   Hx of adenomatous colonic polyps    Hyperlipidemia    Hypertension    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   LVH (left ventricular hypertrophy) 11/02/2022   LVH  TTE 08/2013: severe basal septal LVH  TTE 03/2019:  severe asymmetric LVH, EF 60-65, RVSP 24.6  TTE 10/28/22: EF 60, no RWMA, severe asymmetric LVH, NL RVSF, severe RVE, mild LAE, severe RAE, mild MR, AV sclerosis, asc aorta 41 mm, RAP 8  CMR 01/25/23: EF 57, mod ASH, RVEF 44, no significant LGE   Malignant neoplasm of prostate (HCC) 05/18/2012   Neuromuscular disorder (HCC)    neuropathy feet   Night sweats 12/10/2021   Olecranon fracture, left, closed, initial encounter 07/10/2022   Patellar sleeve fracture of left knee 2021   Post herpetic neuralgia 03/15/2019   Prostate cancer (HCC) 2006   Prostatectomy   Shingles 01/2019   left side of face/eye/neck/ear    SKIN CANCER, HX OF 03/11/2009   Moh's L hand for Squamous Cell;2 other squamous cells removed w/o Mohs 2013 Leita Forbes MD  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   Ulcer of lower extremity (HCC) 10/21/2022   Past Surgical History:  Procedure Laterality Date   BLADDER SUSPENSION  2010   COLONOSCOPY W/ POLYPECTOMY  2003;2010;04/2014   Tubular adenoma: recall 5 yrs (after 04/2019)   EYE SURGERY Bilateral 2021,2022   FEMUR IM NAIL Left 07/12/2022   Procedure: INTRAMEDULLARY (IM) NAIL FEMORAL;  Surgeon: Beverley Evalene BIRCH, MD;  Location: [facility];  Service: Orthopedics;  Laterality: Left;   HERNIA REPAIR  07/2008   Dr.Martin   INGUINAL HERNIA REPAIR Left 08/25/2020   Procedure: OPEN LEFT INGUINAL HERNIA REPAIR WITH MESH;  Surgeon: Gladis Cough, MD;  Location: [facility];  Service: General;  Laterality: Left;   MOHS SURGERY     left hand   MOHS SURGERY Left 12/2020   ear   ORIF ELBOW FRACTURE Left 07/14/2022   Procedure: OPEN REDUCTION INTERNAL FIXATION (ORIF) OF LEFT ELBOW;  Surgeon: Beverley Evalene BIRCH, MD;  Location: [facility];  Service: Orthopedics;  Laterality: Left;   PROSTATECTOMY  2006   Robotic for adenocarcinoma; Dr Renay Moats   RADIOFREQUENCY ABLATION  01/15/2007   for ectopic atrial foci   SEPTOPLASTY  Age 72   TRANSTHORACIC ECHOCARDIOGRAM  09/02/2013   Normal.  EF 60%.   LAE and RAE.   XI ROBOTIC ASSISTED INGUINAL HERNIA REPAIR WITH MESH Right 01/11/2021   Procedure: XI ROBOTIC ASSISTED  RIGHT INGUINAL HERNIA REPAIR WITH MESH;  Surgeon: Gladis Cough, MD;  Location: [facility];  Service: General;  Laterality: Right;   Family History  Problem Relation Age of Onset   Lung disease Father        Black Lung   Heart disease Father  Congenital   Emphysema Father    COPD Father    Diabetes Mother    Stroke Mother 65   Hypertension Brother    Heart disease Brother    Hyperlipidemia Brother    Heart attack Brother 57       sudden death   Cancer Neg Hx    Colon cancer Neg Hx    Esophageal cancer Neg Hx    Rectal cancer Neg Hx    Stomach cancer Neg Hx    Allergies as of 07/10/2024       Reactions   Bacitra-neomycin-polymyxin-hc Rash   bacitracin / neomycin / polymyxin B   Levofloxacin Rash   Neosporin [neomycin-bacitracin Zn-polymyx] Other (See Comments)   Dry/scaly skin/blistering    Silicone Other (See Comments)   silicones   Tape Other (See Comments)   ADHESIVE TAPE; BLISTERS,        Medication List        Accurate as of July 10, 2024 12:11 PM. If you have any questions, ask your nurse or doctor.          acetaminophen  500 MG tablet Commonly known as: TYLENOL  Take 1,000 mg by mouth every 8 (eight) hours as needed for moderate pain.   alendronate  70 MG tablet Commonly known as: FOSAMAX  Take 1 tablet (70 mg total) by mouth once a week. Take with a full glass of water  on an empty stomach.   cetaphil cream Apply 1 application topically daily. What changed:  when to take this reasons to take this   cholecalciferol  25 MCG (1000 UNIT) tablet Commonly known as: VITAMIN D3 Take 2 tablets (2,000 Units total) by mouth daily.   Eliquis  5 MG Tabs tablet Generic drug: apixaban  TAKE 1 TABLET TWICE A DAY   furosemide  20 MG tablet Commonly known as: LASIX  TAKE 1 TABLET DAILY   lisinopril  30 MG tablet Commonly known as:  ZESTRIL  Take 1 tablet (30 mg total) by mouth daily. What changed:  medication strength how much to take Changed by: Charlies Bellini   simvastatin  20 MG tablet Commonly known as: ZOCOR  TAKE 1 TABLET AT BEDTIME   triamcinolone  55 MCG/ACT Aero nasal inhaler Commonly known as: NASACORT  Place 1 spray into the nose daily as needed (allergies).        All past medical history, surgical history, allergies, family history, immunizations andmedications were updated in the EMR today and reviewed under the history and medication portions of their EMR.     ROS Negative, with the exception of above mentioned in HPI   Objective:  BP (!) 148/80   Pulse 68   Temp 97.9 F (36.6 C)   Wt 195 lb (88.5 kg)   SpO2 96%   BMI 25.04 kg/m  Body mass index is 25.04 kg/m. Physical Exam Vitals and nursing note reviewed. Exam conducted with a chaperone present.  Constitutional:      General: He is not in acute distress.    Appearance: Normal appearance. He is not ill-appearing, toxic-appearing or diaphoretic.  HENT:     Head: Normocephalic and atraumatic.  Eyes:     General: No scleral icterus.       Right eye: No discharge.        Left eye: No discharge.     Extraocular Movements: Extraocular movements intact.     Pupils: Pupils are equal, round, and reactive to light.  Cardiovascular:     Rate and Rhythm: Normal rate and regular rhythm.     Heart sounds: No  murmur heard. Pulmonary:     Effort: Pulmonary effort is normal. No respiratory distress.     Breath sounds: Normal breath sounds. No wheezing, rhonchi or rales.  Musculoskeletal:     Right lower leg: No edema.     Left lower leg: No edema.  Skin:    General: Skin is warm.     Findings: No rash.  Neurological:     Mental Status: He is alert and oriented to person, place, and time. Mental status is at baseline.  Psychiatric:        Mood and Affect: Mood normal.        Behavior: Behavior normal.        Thought Content: Thought  content normal.        Judgment: Judgment normal.     No results found. No results found. No results found for this or any previous visit (from the past 24 hours).  Assessment/Plan: Mike Day is a 78 y.o. male present for OV for  Hypertension: Blood pressure today on repeat x 2 still mildly above goal.  Goal<130/80 We did compare his home BP cuff today and home BP cuff is not reading accurately and readings markedly higher. Increase lisinopril  20> 30 mg every day.  Nurse visit in 4 weeks for BP recheck.   Reviewed expectations re: course of current medical issues. Discussed self-management of symptoms. Outlined signs and symptoms indicating need for more acute intervention. Patient verbalized understanding and all questions were answered. Patient received an After-Visit Summary.  Return in about 11 weeks (around 09/25/2024), or if symptoms worsen or fail to improve, for Routine chronic condition follow-up.   Orders Placed This Encounter  Procedures   Vital signs - notify MD   Meds ordered this encounter  Medications   lisinopril  (ZESTRIL ) 30 MG tablet    Sig: Take 1 tablet (30 mg total) by mouth daily.    Dispense:  90 tablet    Refill:  1   Referral Orders  No referral(s) requested today     Note is dictated utilizing voice recognition software. Although note has been proof read prior to signing, occasional typographical errors still can be missed. If any questions arise, please do not hesitate to call for verification.   electronically signed by:  Charlies Bellini, DO  [facility] - [place]

## 2024-07-11 NOTE — Telephone Encounter (Signed)
 There is no specific brand we recommend. I do not recommend putting a lot of money or buying the most expensive brand, as those do not seem to last any longer or work better.  I do not recommend wrist blood pressure cuffs, they are notoriously inaccurate.

## 2024-07-19 DIAGNOSIS — M47816 Spondylosis without myelopathy or radiculopathy, lumbar region: Secondary | ICD-10-CM | POA: Diagnosis not present

## 2024-07-30 DIAGNOSIS — N401 Enlarged prostate with lower urinary tract symptoms: Secondary | ICD-10-CM | POA: Diagnosis not present

## 2024-07-30 DIAGNOSIS — R3912 Poor urinary stream: Secondary | ICD-10-CM | POA: Diagnosis not present

## 2024-07-30 DIAGNOSIS — R351 Nocturia: Secondary | ICD-10-CM | POA: Diagnosis not present

## 2024-07-30 DIAGNOSIS — R350 Frequency of micturition: Secondary | ICD-10-CM | POA: Diagnosis not present

## 2024-08-06 DIAGNOSIS — Z85828 Personal history of other malignant neoplasm of skin: Secondary | ICD-10-CM | POA: Diagnosis not present

## 2024-08-06 DIAGNOSIS — L570 Actinic keratosis: Secondary | ICD-10-CM | POA: Diagnosis not present

## 2024-08-06 DIAGNOSIS — C44719 Basal cell carcinoma of skin of left lower limb, including hip: Secondary | ICD-10-CM | POA: Diagnosis not present

## 2024-08-07 ENCOUNTER — Other Ambulatory Visit: Payer: Self-pay

## 2024-08-07 ENCOUNTER — Ambulatory Visit

## 2024-08-07 VITALS — BP 148/68 | HR 50

## 2024-08-07 DIAGNOSIS — I10 Essential (primary) hypertension: Secondary | ICD-10-CM

## 2024-08-07 MED ORDER — LISINOPRIL 40 MG PO TABS: 40.0000 mg | ORAL_TABLET | Freq: Every day | ORAL | 1 refills | Status: AC

## 2024-08-07 NOTE — Progress Notes (Signed)
 LM for pt to return call to discuss.

## 2024-08-07 NOTE — Telephone Encounter (Signed)
 [facility] mail order pharmacy requesting clarification for medication lisinopril  30 mg tablets. Pharmacy states that pt appears to be taking both medications lisinopril  20 mg and 30 mg tablets. Please clarify if pt is supposed to be taking both?  Inv# [id number]   Ph# [phone]  please address

## 2024-08-07 NOTE — Addendum Note (Signed)
 Addended by: Brooklyn Alfredo A on: 08/07/2024 04:34 PM   Modules accepted: Orders

## 2024-08-07 NOTE — Progress Notes (Signed)
 Mike Day is a 78 y.o. male presents to the office today for Blood pressure recheck secondary to elevated BP at home and in office [Ex: elevated BP in office, med start, regimen change].  Blood pressure medication: lisinopril  (ZESTRIL ) 30 MG tablet  (med, dose, frequency) If on medication, Last dose was at least 1-2 hours prior to recheck: Yes 1)Blood pressure was taken in the right arm after patient rested for 5 minutes.  2)Blood pressure was taken in the right arm after patient rested for 5 minutes.  BP (!) 148/68 (BP Location: Right Arm, Cuff Size: Normal)   Pulse (!) 50   Mike Day

## 2024-08-08 DIAGNOSIS — M47816 Spondylosis without myelopathy or radiculopathy, lumbar region: Secondary | ICD-10-CM | POA: Diagnosis not present

## 2024-08-08 NOTE — Progress Notes (Signed)
 LVM to discuss. Sent MyChart.
